# Patient Record
Sex: Male | Born: 1940 | Race: White | Hispanic: No | Marital: Married | Smoking: Former smoker
Health system: Southern US, Community
[De-identification: ages and names within clinical notes are randomized; demographics above are authoritative.]

## PROBLEM LIST (undated history)

## (undated) DIAGNOSIS — I4892 Unspecified atrial flutter: Secondary | ICD-10-CM

## (undated) DIAGNOSIS — I509 Heart failure, unspecified: Secondary | ICD-10-CM

## (undated) DIAGNOSIS — I255 Ischemic cardiomyopathy: Secondary | ICD-10-CM

## (undated) DIAGNOSIS — Z992 Dependence on renal dialysis: Secondary | ICD-10-CM

## (undated) DIAGNOSIS — IMO0001 Reserved for inherently not codable concepts without codable children: Secondary | ICD-10-CM

## (undated) DIAGNOSIS — E785 Hyperlipidemia, unspecified: Secondary | ICD-10-CM

## (undated) DIAGNOSIS — I2510 Atherosclerotic heart disease of native coronary artery without angina pectoris: Secondary | ICD-10-CM

## (undated) DIAGNOSIS — J449 Chronic obstructive pulmonary disease, unspecified: Secondary | ICD-10-CM

## (undated) DIAGNOSIS — K219 Gastro-esophageal reflux disease without esophagitis: Secondary | ICD-10-CM

## (undated) DIAGNOSIS — C61 Malignant neoplasm of prostate: Secondary | ICD-10-CM

## (undated) DIAGNOSIS — M1990 Unspecified osteoarthritis, unspecified site: Secondary | ICD-10-CM

## (undated) DIAGNOSIS — N289 Disorder of kidney and ureter, unspecified: Secondary | ICD-10-CM

## (undated) DIAGNOSIS — I10 Essential (primary) hypertension: Secondary | ICD-10-CM

## (undated) DIAGNOSIS — N186 End stage renal disease: Secondary | ICD-10-CM

## (undated) HISTORY — PX: CORONARY STENT PLACEMENT: SHX1402

## (undated) HISTORY — PX: KIDNEY SURGERY: SHX687

---

## 2005-09-14 ENCOUNTER — Ambulatory Visit: Payer: Self-pay | Admitting: Internal Medicine

## 2005-09-30 ENCOUNTER — Ambulatory Visit: Payer: Self-pay | Admitting: Internal Medicine

## 2006-02-23 ENCOUNTER — Ambulatory Visit: Payer: Self-pay | Admitting: Urology

## 2006-05-06 ENCOUNTER — Ambulatory Visit: Payer: Self-pay | Admitting: Urology

## 2006-11-04 ENCOUNTER — Ambulatory Visit: Payer: Self-pay | Admitting: Urology

## 2007-08-02 ENCOUNTER — Ambulatory Visit: Payer: Self-pay | Admitting: Urology

## 2007-09-05 ENCOUNTER — Ambulatory Visit: Payer: Self-pay | Admitting: Urology

## 2007-09-06 ENCOUNTER — Other Ambulatory Visit: Payer: Self-pay

## 2007-09-06 ENCOUNTER — Ambulatory Visit: Payer: Self-pay | Admitting: Urology

## 2007-09-19 ENCOUNTER — Ambulatory Visit: Payer: Self-pay | Admitting: Urology

## 2008-01-10 ENCOUNTER — Ambulatory Visit: Payer: Self-pay | Admitting: Urology

## 2008-01-11 ENCOUNTER — Ambulatory Visit: Payer: Self-pay | Admitting: Nephrology

## 2008-02-08 ENCOUNTER — Other Ambulatory Visit: Payer: Self-pay

## 2008-02-08 ENCOUNTER — Emergency Department: Payer: Self-pay | Admitting: Emergency Medicine

## 2008-05-02 ENCOUNTER — Ambulatory Visit: Payer: Self-pay | Admitting: Urology

## 2008-12-13 ENCOUNTER — Ambulatory Visit: Payer: Self-pay | Admitting: Urology

## 2009-06-16 ENCOUNTER — Ambulatory Visit: Payer: Self-pay | Admitting: Urology

## 2009-12-17 ENCOUNTER — Ambulatory Visit: Payer: Self-pay | Admitting: Urology

## 2010-04-26 ENCOUNTER — Ambulatory Visit: Payer: Self-pay | Admitting: Radiation Oncology

## 2010-05-14 ENCOUNTER — Ambulatory Visit: Payer: Self-pay | Admitting: Radiation Oncology

## 2010-05-27 ENCOUNTER — Ambulatory Visit: Payer: Self-pay | Admitting: Radiation Oncology

## 2010-06-17 ENCOUNTER — Ambulatory Visit: Payer: Self-pay | Admitting: General Practice

## 2010-06-18 ENCOUNTER — Ambulatory Visit: Payer: Self-pay | Admitting: Urology

## 2010-06-26 ENCOUNTER — Ambulatory Visit: Payer: Self-pay | Admitting: Radiation Oncology

## 2010-07-27 ENCOUNTER — Ambulatory Visit: Payer: Self-pay | Admitting: Radiation Oncology

## 2010-08-27 ENCOUNTER — Ambulatory Visit: Payer: Self-pay | Admitting: Radiation Oncology

## 2011-01-11 ENCOUNTER — Ambulatory Visit: Payer: Self-pay | Admitting: Radiation Oncology

## 2011-01-14 DIAGNOSIS — I129 Hypertensive chronic kidney disease with stage 1 through stage 4 chronic kidney disease, or unspecified chronic kidney disease: Secondary | ICD-10-CM | POA: Insufficient documentation

## 2011-01-27 ENCOUNTER — Ambulatory Visit: Payer: Self-pay | Admitting: Radiation Oncology

## 2011-03-03 ENCOUNTER — Ambulatory Visit: Payer: Self-pay | Admitting: Urology

## 2011-05-18 ENCOUNTER — Ambulatory Visit: Payer: Self-pay | Admitting: Unknown Physician Specialty

## 2011-05-29 ENCOUNTER — Inpatient Hospital Stay: Payer: Self-pay | Admitting: Internal Medicine

## 2011-07-14 ENCOUNTER — Ambulatory Visit: Payer: Self-pay | Admitting: Radiation Oncology

## 2011-07-15 LAB — PSA: PSA: 1.4 ng/mL (ref 0.0–4.0)

## 2011-07-28 ENCOUNTER — Ambulatory Visit: Payer: Self-pay | Admitting: Radiation Oncology

## 2011-08-25 ENCOUNTER — Ambulatory Visit: Payer: Self-pay | Admitting: Internal Medicine

## 2011-09-06 ENCOUNTER — Ambulatory Visit: Payer: Self-pay | Admitting: Urology

## 2011-10-05 ENCOUNTER — Encounter: Payer: Self-pay | Admitting: Internal Medicine

## 2011-10-28 ENCOUNTER — Encounter: Payer: Self-pay | Admitting: Internal Medicine

## 2011-11-27 ENCOUNTER — Encounter: Payer: Self-pay | Admitting: Internal Medicine

## 2012-01-12 ENCOUNTER — Ambulatory Visit: Payer: Self-pay | Admitting: Radiation Oncology

## 2012-01-28 ENCOUNTER — Ambulatory Visit: Payer: Self-pay | Admitting: Radiation Oncology

## 2012-03-20 ENCOUNTER — Ambulatory Visit: Payer: Self-pay | Admitting: Urology

## 2012-09-19 DIAGNOSIS — R339 Retention of urine, unspecified: Secondary | ICD-10-CM | POA: Insufficient documentation

## 2012-09-19 DIAGNOSIS — N059 Unspecified nephritic syndrome with unspecified morphologic changes: Secondary | ICD-10-CM | POA: Insufficient documentation

## 2012-09-19 DIAGNOSIS — M5430 Sciatica, unspecified side: Secondary | ICD-10-CM | POA: Insufficient documentation

## 2012-09-21 DIAGNOSIS — R3129 Other microscopic hematuria: Secondary | ICD-10-CM | POA: Insufficient documentation

## 2012-12-27 ENCOUNTER — Ambulatory Visit: Payer: Self-pay | Admitting: Radiation Oncology

## 2013-01-27 ENCOUNTER — Ambulatory Visit: Payer: Self-pay | Admitting: Radiation Oncology

## 2013-03-15 DIAGNOSIS — R350 Frequency of micturition: Secondary | ICD-10-CM

## 2013-07-03 DIAGNOSIS — I10 Essential (primary) hypertension: Secondary | ICD-10-CM | POA: Insufficient documentation

## 2013-07-03 DIAGNOSIS — N184 Chronic kidney disease, stage 4 (severe): Secondary | ICD-10-CM | POA: Insufficient documentation

## 2014-01-10 ENCOUNTER — Ambulatory Visit: Payer: Self-pay | Admitting: Radiation Oncology

## 2014-03-18 ENCOUNTER — Ambulatory Visit: Payer: Self-pay | Admitting: Radiation Oncology

## 2014-03-27 ENCOUNTER — Ambulatory Visit: Payer: Self-pay | Admitting: Radiation Oncology

## 2014-07-25 ENCOUNTER — Ambulatory Visit: Payer: Self-pay | Admitting: Vascular Surgery

## 2014-07-25 LAB — BASIC METABOLIC PANEL
ANION GAP: 7 (ref 7–16)
BUN: 52 mg/dL — ABNORMAL HIGH (ref 7–18)
CALCIUM: 8 mg/dL — AB (ref 8.5–10.1)
CO2: 25 mmol/L (ref 21–32)
Chloride: 107 mmol/L (ref 98–107)
Creatinine: 3.87 mg/dL — ABNORMAL HIGH (ref 0.60–1.30)
EGFR (African American): 17 — ABNORMAL LOW
EGFR (Non-African Amer.): 15 — ABNORMAL LOW
Glucose: 95 mg/dL (ref 65–99)
Osmolality: 291 (ref 275–301)
Potassium: 5.2 mmol/L — ABNORMAL HIGH (ref 3.5–5.1)
Sodium: 139 mmol/L (ref 136–145)

## 2014-07-25 LAB — URINALYSIS, COMPLETE
BILIRUBIN, UR: NEGATIVE
Bacteria: NONE SEEN
Glucose,UR: NEGATIVE mg/dL (ref 0–75)
Ketone: NEGATIVE
Leukocyte Esterase: NEGATIVE
NITRITE: NEGATIVE
Ph: 5 (ref 4.5–8.0)
RBC,UR: 6 /HPF (ref 0–5)
Specific Gravity: 1.009 (ref 1.003–1.030)
Squamous Epithelial: NONE SEEN

## 2014-07-25 LAB — CBC
HCT: 39.7 % — ABNORMAL LOW (ref 40.0–52.0)
HGB: 12.5 g/dL — AB (ref 13.0–18.0)
MCH: 26.9 pg (ref 26.0–34.0)
MCHC: 31.6 g/dL — ABNORMAL LOW (ref 32.0–36.0)
MCV: 85 fL (ref 80–100)
PLATELETS: 200 10*3/uL (ref 150–440)
RBC: 4.65 10*6/uL (ref 4.40–5.90)
RDW: 13.8 % (ref 11.5–14.5)
WBC: 15.8 10*3/uL — ABNORMAL HIGH (ref 3.8–10.6)

## 2014-08-02 ENCOUNTER — Ambulatory Visit: Payer: Self-pay | Admitting: Vascular Surgery

## 2014-09-03 ENCOUNTER — Ambulatory Visit: Payer: Self-pay | Admitting: Vascular Surgery

## 2014-09-25 DIAGNOSIS — Z8546 Personal history of malignant neoplasm of prostate: Secondary | ICD-10-CM | POA: Insufficient documentation

## 2014-09-25 DIAGNOSIS — E559 Vitamin D deficiency, unspecified: Secondary | ICD-10-CM | POA: Insufficient documentation

## 2014-09-25 DIAGNOSIS — J45909 Unspecified asthma, uncomplicated: Secondary | ICD-10-CM | POA: Insufficient documentation

## 2014-09-25 DIAGNOSIS — I2510 Atherosclerotic heart disease of native coronary artery without angina pectoris: Secondary | ICD-10-CM | POA: Insufficient documentation

## 2014-09-27 DIAGNOSIS — I252 Old myocardial infarction: Secondary | ICD-10-CM | POA: Insufficient documentation

## 2014-11-06 DIAGNOSIS — Z85528 Personal history of other malignant neoplasm of kidney: Secondary | ICD-10-CM | POA: Insufficient documentation

## 2014-11-12 ENCOUNTER — Ambulatory Visit: Payer: Self-pay | Admitting: Vascular Surgery

## 2014-11-12 LAB — BASIC METABOLIC PANEL
Anion Gap: 8 (ref 7–16)
BUN: 51 mg/dL — AB (ref 7–18)
CO2: 28 mmol/L (ref 21–32)
Calcium, Total: 8.1 mg/dL — ABNORMAL LOW (ref 8.5–10.1)
Chloride: 111 mmol/L — ABNORMAL HIGH (ref 98–107)
Creatinine: 4.08 mg/dL — ABNORMAL HIGH (ref 0.60–1.30)
EGFR (African American): 19 — ABNORMAL LOW
GFR CALC NON AF AMER: 15 — AB
Glucose: 95 mg/dL (ref 65–99)
Osmolality: 306 (ref 275–301)
POTASSIUM: 4.5 mmol/L (ref 3.5–5.1)
SODIUM: 147 mmol/L — AB (ref 136–145)

## 2014-12-17 ENCOUNTER — Ambulatory Visit: Payer: Self-pay | Admitting: Vascular Surgery

## 2014-12-17 LAB — BASIC METABOLIC PANEL
ANION GAP: 7 (ref 7–16)
BUN: 45 mg/dL — ABNORMAL HIGH (ref 7–18)
Calcium, Total: 7.7 mg/dL — ABNORMAL LOW (ref 8.5–10.1)
Chloride: 108 mmol/L — ABNORMAL HIGH (ref 98–107)
Co2: 26 mmol/L (ref 21–32)
Creatinine: 4.3 mg/dL — ABNORMAL HIGH (ref 0.60–1.30)
GFR CALC AF AMER: 18 — AB
GFR CALC NON AF AMER: 15 — AB
Glucose: 98 mg/dL (ref 65–99)
Osmolality: 293 (ref 275–301)
POTASSIUM: 4.5 mmol/L (ref 3.5–5.1)
Sodium: 141 mmol/L (ref 136–145)

## 2015-04-01 DIAGNOSIS — M109 Gout, unspecified: Secondary | ICD-10-CM | POA: Insufficient documentation

## 2015-04-01 DIAGNOSIS — T7840XA Allergy, unspecified, initial encounter: Secondary | ICD-10-CM | POA: Insufficient documentation

## 2015-04-01 DIAGNOSIS — N059 Unspecified nephritic syndrome with unspecified morphologic changes: Secondary | ICD-10-CM | POA: Insufficient documentation

## 2015-04-01 DIAGNOSIS — C61 Malignant neoplasm of prostate: Secondary | ICD-10-CM | POA: Insufficient documentation

## 2015-04-08 DIAGNOSIS — I5022 Chronic systolic (congestive) heart failure: Secondary | ICD-10-CM | POA: Insufficient documentation

## 2015-04-19 NOTE — Op Note (Signed)
PATIENT NAME:  Maurice Peterson, MO MR#:  [id number] DATE OF BIRTH:  [birth year]  DATE OF PROCEDURE:  09/03/2014  PREOPERATIVE DIAGNOSES:  1. 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  2. Chronic renal insufficiency stage V.  3. Hypertension.  4. Obesity.   POSTOPERATIVE DIAGNOSES:  1. 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  2. Chronic renal insufficiency stage V.  3. Hypertension.  4. Obesity.   PROCEDURES PERFORMED:  1. Contrast injection of left arm arteriovenous fistula.  2. Percutaneous transluminal angioplasty of the arterial anastomosis to 4 mm.  3. Coil embolization of a branch of the cephalic vein.  4. Coil embolization of an additional branch of the cephalic vein.   SURGEON: Hortencia Pilar, MD.  SEDATION: Versed plus fentanyl. Continuous ECG, pulse oximetry, and cardiopulmonary monitoring is performed throughout the entire procedure by the interventional radiology nurse. Total sedation time was approximately 50 minutes.   ACCESS: A 6 French sheath, retrograde direction  left arm radiocephalic fistula.   CONTRAST USED: Isovue 30 mL.   FLUOROSCOPY TIME: 12.1 minutes.   INDICATIONS: The patient presents with failure of his fistula to mature. His renal function has been stable but is hovering at around 18 to 19. Duplex ultrasound of the AV fistula demonstrated a high-grade lesion within the arterial anastomotic area as well as extensive branches limiting the cephalic flow. Risks and benefits for angiography and intervention of his fistula were reviewed. All questions answered. The patient has agreed to proceed.   DESCRIPTION OF PROCEDURE: The patient is taken to the special procedure suite, placed in the supine position. After adequate sedation is achieved he is positioned supine with his left arm extended palm upward. The left arm is prepped and draped in sterile fashion. Appropriate timeout is called.   Ultrasound is placed in a sterile  sleeve. Ultrasound is being utilized in this circumstance secondary to  requiring a very proximal stick so that the  true cephalic vein can be followed and then access obtained at the level of the antecubital fossa. This is accomplished, verifying patency by compressing the cephalic vein and noting it is echolucent throughout its course.  1% lidocaine is then infiltrated and in a retrograde direction a  microneedle is inserted into the vein at the level of the antecubital fossa, micro wire followed by micro sheath, J-wire followed by a 6 French sheath.   Using a combination of a floppy Glidewire and a Kumpe catheter the wire and catheter are negotiated into the distal radial artery. Hand injection of contrast is then utilized to demonstrate the anatomy.  There is a patent anastomosis, it is somewhat small measuring approximately 2.5 mm in diameter. There is a small cephalic vein in its first 2-3 cm and then it broadens into a more a normal-appearing cephalic vein, however within 5 cm of the actual anastomosis there is a bifurcation of the cephalic system with a large branch extending off. Several other branches are noted as well. Palmar arch appears intact on the injection.   3000 units of heparin is given and a Magic torque wire is advanced through the lesion. A 4 x 4 Lutonix balloon is then advanced over the wire and inflated to approximately 10 atmospheres for 3 minutes. Followup imaging demonstrates that the arterial anastomosis is now widely patent with significant improvement in the overall appearance of the vein and much improved flow.   Attention is then turned to several of the branches. A large branch is engaged with the KMP  catheter and a floppy Glidewire and 2 coils are placed into the branch. The Kumpe catheter is then renegotiated into a separate branch and another 2 coils are placed. Followup imaging demonstrates significant improvement in the flow throughout the true cephalic and the sheath is  then pulled after 4-0 Monocryl pursestring suture has been placed. The patient tolerated the procedure well and there were no immediate complications.   INTERPRETATION: Initially there is a several centimeter segment at the arterial anastomosis of just undersized narrowed vein. There is some disease noted within the radial artery distally, but the flow into the fistula from the proximal portion of the radial artery is widely patent. Following 4 mm Lutonix inflation there is an excellent result.   Following embolization using coils of 2 large side branches there is significant improvement with greater flow within the true cephalic.   SUMMARY: Successful intervention with the hope for improved maturation of his left wrist fistula as described above.    ____________________________ Katha Cabal, MD ggs:bu D: 09/04/2014 19:32:19 ET T: 09/04/2014 19:51:05 ET JOB#: [id number]  cc: Katha Cabal, MD, <Dictator> Katha Cabal MD ELECTRONICALLY SIGNED 10/14/2014 20:40

## 2015-04-19 NOTE — Op Note (Signed)
PATIENT NAME:  Maurice Peterson, Maurice Peterson MR#:  [id number] DATE OF BIRTH:  [birth year]  DATE OF PROCEDURE:  08/02/2014  PREOPERATIVE DIAGNOSIS: End-stage renal disease requiring hemodialysis.   POSTOPERATIVE DIAGNOSIS: End-stage renal disease requiring hemodialysis.   PROCEDURE PERFORMED: Creation of a left arm radiocephalic fistula.   SURGEON: Hortencia Pilar, MD  ASSISTANT: Ms. Melvyn Neth  ANESTHESIA: General by LMA.   FLUIDS: Per anesthesia record.   ESTIMATED BLOOD LOSS: Minimal.   SPECIMEN: None.   INDICATIONS: [name] is a 74 year old gentleman who will require hemodialysis and is therefore undergoing creation of an upper extremity access. Risks and benefits are reviewed. All questions answered. The patient has agreed to proceed.   DESCRIPTION OF PROCEDURE: The patient is taken to the operating room and placed in the supine position. After adequate general anesthesia is induced and appropriate invasive monitors are placed, he is positioned supine with his left arm extended palm upward. The left arm is prepped and draped in a sterile fashion. Appropriate timeout is called.   A linear incision is created midway between the cephalic vein and the radial artery impulse. Dissection is carried down through the soft tissues and the cephalic vein is dissected circumferentially and marked with a surgical marker.   The radial artery is then isolated through the same incision and looped proximally and distally with Silastic vessel loops.   The vein is roughly approximated to the artery and subsequently ligated distally with a 3-0 silk tie. It is then transected with Potts scissors, dilated with Donna Christen coronary dilators up to 3 mm and irrigated with heparinized saline. A small bulldog clamp is placed more proximally. Arteriotomy is then made in the radial artery and 6-0 Prolene stay sutures are placed. The vein is then approximated in an end vein to side radial artery anastomosis using interrupted 6-0  Prolene. Flushing maneuvers are performed and flow is established filling the fistula. A thrill is palpable.   The wound is then irrigated, closed with interrupted 3-0 Vicryl for the subcutaneous layer and 4-0 Monocryl subcuticular followed by Dermabond. The patient tolerated the procedure well and there were no immediate complications.   ____________________________ Katha Cabal, MD ggs:sb D: 08/09/2014 11:31:25 ET T: 08/09/2014 12:10:43 ET JOB#: [id number]  cc: Katha Cabal, MD, <Dictator> Katha Cabal MD ELECTRONICALLY SIGNED 09/03/2014 22:34

## 2015-04-19 NOTE — Op Note (Signed)
PATIENT NAME:  Maurice Peterson, Maurice Peterson MR#:  [id number] DATE OF BIRTH:  1941/03/05  DATE OF PROCEDURE:  11/12/2014  PREOPERATIVE DIAGNOSES:  1.  Complication of arteriovenous dialysis device with failure of left radiocephalic fistula to mature.  2.  Stage V renal insufficiency.  POSTOPERATIVE DIAGNOSES: 1.  Complication of arteriovenous dialysis device with failure of left radiocephalic fistula to mature.  2.  Stage V renal insufficiency.    PROCEDURES PERFORMED:  1.  Contrast injection left radiocephalic fistula.  2.  Percutaneous transluminal angioplasty, balloon assisted maturation, left radiocephalic fistula, beginning with 4 mm balloon and extending through 7 mm balloon.   SURGEON: Katha Cabal, MD   SEDATION: Versed plus fentanyl IV.   Continuous ECG, pulse oximetry and cardiopulmonary monitoring is performed throughout the entire procedure by the interventional radiology nurse.   TOTAL SEDATION TIME: 50 minutes   ACCESS: A 6 French sheath, antegrade direction, left radiocephalic fistula.   FLUOROSCOPY TIME: 3.2 minutes.   CONTRAST USED: 25 mL Isovue.   INDICATIONS: Maurice Peterson is a 74 year old gentleman who is presumptively scheduled to start dialysis at the end of December. He has undergone creation of a fistula, but this has not yielded an adequate access that would be readily cannulated. Risks and benefits for the balloon-assisted maturation were reviewed. All questions answered. The patient agrees to proceed.   DESCRIPTION OF PROCEDURE: The patient is taken to special procedures and placed in a supine position. After adequate sedation is achieved, he is positioned supine with his left arm extended, palm upward. The left arm is prepped and draped in sterile fashion. One percent lidocaine was infiltrated in the soft tissues, at the level of the anastomosis, i.e., at the wrist. Ultrasound is placed in a sterile sleeve. The fistula is identified with ultrasound. It is scanned back to show  the actual anastomosis; it is then scanned forward to a point that would readily allow access. Access is then obtained under direct visualization, after recording an image using a micropuncture needle, microwire, followed by micro sheath, J-wire, followed by a 6 French sheath. Hand injection contrast was used to demonstrate the fistula, which is undersized, but appears uniform. There is a slight irregularity at the tip of the sheath.   Heparin 3000 units are given and a Versacore wire is advanced, beginning with a 4 x 15 Lutonix balloon. Angioplasty to 10 atmospheres is performed for 3 minutes. A second Lutonix, a 5 x 15 is then used and subsequently a 6 x 15 balloon is inflated again for 2-1/2 to 3 minutes. Lastly a 7 x 10 balloon is used for a 2-1/2 minute inflation. Followup imaging demonstrates the vein is now improved significantly in size. There is more rapid flow.   The wire is removed. Sheath is removed after a pursestring suture is placed. There are no immediate complications.   INTERPRETATION: Initial views demonstrate the cephalic vein is uniform and measures approximately 4 mm in diameter, or slightly less. There does appear to be some irregularity at the tip of the sheath near the arterial anastomosis, but otherwise the vein does appear uniform. Previously coiled branch is completely excluded, and there is direct flow through the cephalic vein proper. Several other small branches are still present, but they appear inconsequential.   Following angioplasty serially from 4 to 5 to 6 then finally to 7, there is a significant improvement in the caliber of the cephalic vein with improvement in flow.   SUMMARY: Successful initial treatment using a balloon assisted  maturation technique for the left arm radiocephalic fistula.    ____________________________ Katha Cabal, MD ggs:MT D: 11/12/2014 14:48:08 ET T: 11/12/2014 15:18:35 ET JOB#: [id number]  cc: Katha Cabal, MD,  <Dictator> Katha Cabal MD ELECTRONICALLY SIGNED 11/26/2014 12:59

## 2015-04-23 NOTE — Op Note (Signed)
PATIENT NAME:  Maurice Peterson, Maurice Peterson MR#:  [id number] DATE OF BIRTH:  Mar 14, 1941  DATE OF PROCEDURE:  12/17/2014  PREOPERATIVE DIAGNOSES:  1.  Complication of arteriovenous dialysis device.  2.  Stage V renal insufficiency.   POSTOPERATIVE DIAGNOSES: 1.  Complication of arteriovenous dialysis device.  2.  Stage V renal insufficiency.   PROCEDURE PERFORMED:   1.  Contrast injection left arm AV fistula.  2.  Percutaneous transluminal angioplasty and stent placement, left radiocephalic fistula at the arterial anastomosis.   PROCEDURE PERFORMED BY:  Katha Cabal, M.D.   SEDATION:  Versed 4 mg plus fentanyl 200 mcg administered IV. Continuous ECG, pulse oximetry and cardiopulmonary monitoring was performed throughout the entire procedure by the interventional radiology nurse. Total sedation time was 50 minutes.   ACCESS: A 6 French sheath, retrograde direction, AV fistula.   CONTRAST USED: Isovue 25 mL.   FLUOROSCOPY TIME: 4.9 minutes.   INDICATIONS: Maurice Peterson is a 74 year old gentleman who presents with non-maturation of his fistula. He has undergone one round of angioplasty and is returning for further evaluation and treatment so that he may have a viable access. Risks and benefits are reviewed. The patient agrees to proceed.   DESCRIPTION OF PROCEDURE: The patient is taken to special procedures and placed in the supine position. After adequate sedation is achieved he is positioned supine with his left arm extended palm upward. The left arm was prepped and draped in sterile fashion. Ultrasound is placed in a sterile sleeve. Ultrasound is utilized secondary to lack of appropriate landmarks and to avoid vascular injury. Under real-time visualization, access is obtained at the level of the antecubital fossa in a retrograde direction, microwire followed by micro sheath, J-wire followed by a 6 French sheath.   Floppy Glidewire and Kumpe catheter are then negotiated into the radial artery and hand  injection of contrast is used to demonstrate the fistula as well as the distal radial artery. Greater than 80% stenosis is noted at the arterial anastomosis similar to findings last time. This does not appear to of responded well to angioplasty although initial images after angioplasty the last time did demonstrate a significant result. I would therefore; say this has not yielded a durable reconstruction. Therefore, a Versacore wire is advanced through the Kumpe catheter and a 4 x 6 Lutonix is used to angioplasty the arterial portion. Following this, the wire is exchanged for an 0 and 8 and a 6 x 5 Viabahn is deployed just above the anastomosis extending into the fistula.  It is then postdilated first to 5 and then to 6 mm. Follow-up imaging demonstrates there is now a significant improvement in flow. There is good apposition of the stent. Distal outflow in the upper arm is maintained.   Pursestring suture is placed and the sheath is removed, pressure is held. There are no immediate complications.   INTERPRETATION: Initial views demonstrate a string sign at the arterial portion. Following angioplasty there is improvement, however, there is significant residual stenosis and angioplasty last did not last, particularly along from the previous intervention and, therefore, the Viabahn stent is placed with excellent result. The Viabahn is not deployed in an area that will be accessed. This area that will be used for cannulation remains native cephalic fistula.  The Viabahn is only in the first approximately 2 inches of the fistula at the level of the arterial anastomosis.    ____________________________ Katha Cabal, MD ggs:at D: 12/17/2014 11:55:41 ET T: 12/17/2014 12:21:12 ET JOB#: [id number]  cc: Katha Cabal, MD, <Dictator> Maurice Pascal, MD [facility] Katha Cabal MD ELECTRONICALLY SIGNED 01/07/2015 17:30

## 2015-05-20 ENCOUNTER — Encounter
Admission: RE | Disposition: A | Discharge status: Home / Self Care | Payer: Medicare Other | Source: Ambulatory Visit | Attending: Vascular Surgery

## 2015-05-20 ENCOUNTER — Encounter: Payer: Self-pay | Admitting: *Deleted

## 2015-05-20 ENCOUNTER — Ambulatory Visit
Admission: RE | Admit: 2015-05-20 | Discharge: 2015-05-20 | Disposition: A | Discharge status: Home / Self Care | Payer: Medicare Other | Source: Ambulatory Visit | Attending: Vascular Surgery | Admitting: Vascular Surgery

## 2015-05-20 DIAGNOSIS — Y999 Unspecified external cause status: Secondary | ICD-10-CM | POA: Insufficient documentation

## 2015-05-20 DIAGNOSIS — T82898A Other specified complication of vascular prosthetic devices, implants and grafts, initial encounter: Secondary | ICD-10-CM | POA: Insufficient documentation

## 2015-05-20 DIAGNOSIS — N186 End stage renal disease: Secondary | ICD-10-CM | POA: Diagnosis not present

## 2015-05-20 DIAGNOSIS — Z992 Dependence on renal dialysis: Secondary | ICD-10-CM | POA: Insufficient documentation

## 2015-05-20 HISTORY — DX: Malignant neoplasm of prostate: C61

## 2015-05-20 HISTORY — PX: PERIPHERAL VASCULAR CATHETERIZATION: SHX172C

## 2015-05-20 HISTORY — DX: Essential (primary) hypertension: I10

## 2015-05-20 HISTORY — DX: Chronic obstructive pulmonary disease, unspecified: J44.9

## 2015-05-20 HISTORY — DX: Hyperlipidemia, unspecified: E78.5

## 2015-05-20 LAB — POTASSIUM (ARMC VASCULAR LAB ONLY): Potassium (ARMC vascular lab): 4.3

## 2015-05-20 SURGERY — A/V SHUNTOGRAM/FISTULAGRAM
Anesthesia: Moderate Sedation

## 2015-05-20 MED ORDER — GUAIFENESIN-DM 100-10 MG/5ML PO SYRP: 15.0000 mL | ORAL_SOLUTION | ORAL | Status: DC | PRN

## 2015-05-20 MED ORDER — HEPARIN (PORCINE) IN NACL 2-0.9 UNIT/ML-% IJ SOLN
INTRAMUSCULAR | Status: AC
  Filled 2015-05-20: qty 1000

## 2015-05-20 MED ORDER — HEPARIN SODIUM (PORCINE) 1000 UNIT/ML IJ SOLN
INTRAMUSCULAR | Status: AC
  Filled 2015-05-20: qty 1

## 2015-05-20 MED ORDER — METOPROLOL TARTRATE 1 MG/ML IV SOLN
2.0000 mg | INTRAVENOUS | Status: DC | PRN
Start: 2015-05-20 — End: 2015-05-20

## 2015-05-20 MED ORDER — FENTANYL CITRATE (PF) 100 MCG/2ML IJ SOLN
INTRAMUSCULAR | Status: AC
  Filled 2015-05-20: qty 2

## 2015-05-20 MED ORDER — PHENOL 1.4 % MT LIQD: 1.0000 | OROMUCOSAL | Status: DC | PRN

## 2015-05-20 MED ORDER — FENTANYL CITRATE (PF) 100 MCG/2ML IJ SOLN
INTRAMUSCULAR | Status: AC
Start: 2015-05-20 — End: 2015-05-20
  Filled 2015-05-20: qty 2

## 2015-05-20 MED ORDER — ACETAMINOPHEN 325 MG RE SUPP: 325.0000 mg | RECTAL | Status: DC | PRN

## 2015-05-20 MED ORDER — DOCUSATE SODIUM 100 MG PO CAPS: 100.0000 mg | ORAL_CAPSULE | Freq: Every day | ORAL | Status: DC

## 2015-05-20 MED ORDER — PANTOPRAZOLE SODIUM 40 MG PO TBEC: 40.0000 mg | DELAYED_RELEASE_TABLET | Freq: Every day | ORAL | Status: DC

## 2015-05-20 MED ORDER — MIDAZOLAM HCL 5 MG/5ML IJ SOLN
INTRAMUSCULAR | Status: AC
  Filled 2015-05-20: qty 5

## 2015-05-20 MED ORDER — ACETAMINOPHEN 325 MG PO TABS: 325.0000 mg | ORAL_TABLET | ORAL | Status: DC | PRN

## 2015-05-20 MED ORDER — SODIUM CHLORIDE 0.9 % IV SOLN: INTRAVENOUS | Status: DC

## 2015-05-20 MED ORDER — OXYCODONE-ACETAMINOPHEN 5-325 MG PO TABS: 1.0000 | ORAL_TABLET | ORAL | Status: DC | PRN

## 2015-05-20 MED ORDER — LABETALOL HCL 5 MG/ML IV SOLN: 10.0000 mg | INTRAVENOUS | Status: DC | PRN

## 2015-05-20 MED ORDER — CEFAZOLIN SODIUM 1-5 GM-% IV SOLN
1.0000 g | Freq: Once | INTRAVENOUS | Status: AC
  Administered 2015-05-20: 1 g via INTRAVENOUS

## 2015-05-20 MED ORDER — IOHEXOL 300 MG/ML  SOLN
INTRAMUSCULAR | Status: DC | PRN
  Administered 2015-05-20: 45 mL via INTRAVENOUS

## 2015-05-20 MED ORDER — CEFAZOLIN SODIUM 1-5 GM-% IV SOLN
INTRAVENOUS | Status: AC
  Filled 2015-05-20: qty 50

## 2015-05-20 MED ORDER — ONDANSETRON HCL 4 MG/2ML IJ SOLN: 4.0000 mg | Freq: Four times a day (QID) | INTRAMUSCULAR | Status: DC | PRN

## 2015-05-20 MED ORDER — HYDRALAZINE HCL 20 MG/ML IJ SOLN: 5.0000 mg | INTRAMUSCULAR | Status: DC | PRN

## 2015-05-20 MED ORDER — ALUM & MAG HYDROXIDE-SIMETH 200-200-20 MG/5ML PO SUSP
15.0000 mL | ORAL | Status: DC | PRN
Start: 2015-05-20 — End: 2015-05-20

## 2015-05-20 MED ORDER — LIDOCAINE HCL (PF) 1 % IJ SOLN
INTRAMUSCULAR | Status: AC
  Filled 2015-05-20: qty 10

## 2015-05-20 SURGICAL SUPPLY — 20 items
BALLN ARMADA 6X80X80 (BALLOONS) ×4
BALLN LUTONIX DCB 6X40X130 (BALLOONS) ×4
BALLN ULTRVRSE 5X150X75C (BALLOONS) ×4
BALLN ULTRVRSE 8X100X75 (BALLOONS) ×4
BALLOON ARMADA 6X80X80 (BALLOONS) ×2 IMPLANT
BALLOON LUTONIX DCB 6X40X130 (BALLOONS) ×2 IMPLANT
BALLOON ULTRVRSE 5X150X75C (BALLOONS) ×2 IMPLANT
BALLOON ULTRVRSE 8X100X75 (BALLOONS) ×2 IMPLANT
CATH SLIP KMP 65CM 5FR (CATHETERS) ×4 IMPLANT
DEVICE PRESTO INFLATION (MISCELLANEOUS) ×4 IMPLANT
GUIDEWIRE ANGLED .035 180CM (WIRE) ×4 IMPLANT
PACK ANGIOGRAPHY (CUSTOM PROCEDURE TRAY) ×4 IMPLANT
SET INTRO CAPELLA COAXIAL (SET/KITS/TRAYS/PACK) ×4 IMPLANT
SHEATH BRITE TIP 6FRX5.5 (SHEATH) ×4 IMPLANT
SHEATH BRITE TIP 7FRX5.5 (SHEATH) ×4 IMPLANT
STENT VIABAHN 8X100X120 (Permanent Stent) ×2 IMPLANT
STENT VIABAHN 8X10X120 (Permanent Stent) ×2 IMPLANT
TOWEL OR 17X26 4PK STRL BLUE (TOWEL DISPOSABLE) ×4 IMPLANT
WIRE G 018X200 V18 (WIRE) ×4 IMPLANT
WIRE MAGIC TORQUE 260C (WIRE) ×4 IMPLANT

## 2015-05-20 NOTE — Op Note (Signed)
OPERATIVE NOTE   PROCEDURE: 1. left radiocephalic arteriovenous fistula cannulation under ultrasound guidance 2. left arm AV fistulogram 3. Percutaneous transluminal angioplasty and stent placement left radiocephalic fistula  PRE-OPERATIVE DIAGNOSIS: Complication left arteriovenous fistula                                                       End Stage Renal Disease  POST-OPERATIVE DIAGNOSIS: same as above   SURGEON: Katha Cabal, M.D.  ANESTHESIA: Conscious Sedation   ESTIMATED BLOOD LOSS: minimal  FINDING(S): 1. Diffuse narrowing of the venous outflow of the left arm radiocephalic fistula  SPECIMEN(S):  None  CONTRAST: 55 cc  FLUOROSCOPY TIME: 5 minutes  INDICATIONS: Maurice Peterson is a 74 y.o. male who  presents with malfunctioning left AV access.  The patient is scheduled for left AV fistulogram .  The patient is aware the risks include but are not limited to: bleeding, infection, thrombosis of the cannulated access, and possible anaphylactic reaction to the contrast.  The patient acknowledges if the access can not be salvaged a tunneled catheter will be needed and will be placed during this procedure.  The patient is aware of the risks of the procedure and elects to proceed with the angiogram and intervention.  DESCRIPTION: After full informed written consent was obtained, the patient was brought back to the Special Procedure suite and placed supine position.  Appropriate cardiopulmonary monitors were placed.  The left arm was prepped and draped in the standard fashion.  Appropriate timeout is called. The left radiocephalic fistula  was cannulated with a micropuncture needle.  The microwire was advanced and the needle was exchanged for  a microsheath.  The J-wire was then advanced and a 6 Fr sheath inserted.  Hand injections were completed to image the access from the arterial anastomosis through the entire access.  The central venous structures were also imaged by hand  injections.  Based on the images,  diffuse narrowing of the venous outflow essentially across the entire length of the forearm is noted. Outflow at the antecubital fossa is via the basilic the cephalic vein proper occludes just proximal to the antecubital crease. There is also filling of the brachial veins noted. Central veins are patent. Later in the case reflux image demonstrates that the arterial anastomosis is widely patent as is the previously placed Viabahn stent at the arterial anastomosis.  Based on these images 4000 units of heparin was given and an 035 wire is advanced through the narrowed area and negotiated into the basilic vein. Initially a 5 x 100 balloon supple 6 x 100 balloon was used to dilate this area.  Results although a significant improvement do not yield a fistula that would be readily cannulated and therefore I elected to place a 8 x 100 Viabahn stent. This is deployed without difficulty and then postdilated with an 8 mm balloon. The outflow of the Viabahn stent at the antecubital fossa is treated with a 6 mm Lutonix inflation. I hope is to prevent a hyperplastic response at the proximal margin of the Viabahn stent. Follow-up imaging demonstrates wide patency of the fistula with rapid flow there is approximately 2 cm between the previously placed stent in the arterial portion and the new stent which essentially covers the zone of cannulation. This area appears to be a proximally 7-8 mm  in diameter and free of stenosis or narrowing and therefore I will not add further stents at this time.  A 4-0 Monocryl purse-string suture was sewn around the sheath.  The sheath was removed and light pressure was applied.  A sterile bandage was applied to the puncture site.    COMPLICATIONS: Inability to negotiate the true fistula via a retrograde approach at the level of the antecubital fossa and therefore antegrade puncture was performed at the wrist  CONDITION: Maurice Peterson,  M.D [facility] Office: [phone]  05/20/2015 4:04 PM

## 2015-05-20 NOTE — Discharge Instructions (Signed)

## 2015-05-20 NOTE — H&P (Signed)
 [facility] History & Physical Update  The patient was interviewed and re-examined.  The patient's previous History and Physical has been reviewed and is unchanged.  There is no change in the plan of care.  Schnier, Dolores Lory, MD  05/20/2015, 4:04 PM

## 2015-05-23 ENCOUNTER — Encounter: Payer: Self-pay | Admitting: Vascular Surgery

## 2015-06-17 NOTE — H&P (Signed)
[facility] Admission History & Physical  MRN : [id number]  Maurice Peterson is a 74 y.o. 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  History of Present Illness: The patient is sent by his nephrologist secondary to failure of maturation of his AV fistula. He is now reached the point where he will be initiated on dialysis and therefore needs adequate access. For this reason he is undergoing angiography with the hope for intervention to provide a stable AV access.  No current facility-administered medications for this encounter.   Current Outpatient Prescriptions  Medication Sig Dispense Refill  . albuterol (PROVENTIL HFA;VENTOLIN HFA) 108 (90 BASE) MCG/ACT inhaler Inhale 2 puffs into the lungs every 4 (four) hours as needed for wheezing or shortness of breath.    [name] amLODipine (NORVASC) 5 MG tablet Take 5 mg by mouth daily.    [name] aspirin 81 MG tablet Take 81 mg by mouth daily.    [name] atorvastatin (LIPITOR) 10 MG tablet Take 10 mg by mouth daily.    . budesonide-formoterol (SYMBICORT) 160-4.5 MCG/ACT inhaler Inhale 2 puffs into the lungs 2 (two) times daily.    . calcium carbonate (TUMS - DOSED IN MG ELEMENTAL CALCIUM) 500 MG chewable tablet Chew 1 tablet by mouth 2 (two) times daily.    . Cholecalciferol (VITAMIN D3) 1000 UNITS CAPS Take 2,000 Units by mouth 2 (two) times daily.    . colchicine 0.6 MG tablet Take 0.6 mg by mouth as needed.    . febuxostat (ULORIC) 40 MG tablet Take 40 mg by mouth daily.    . ferrous fumarate (FERRO-SEQUELS) 50 MG CR tablet Take 50 mg by mouth daily.    . furosemide (LASIX) 20 MG tablet Take 20 mg by mouth.    . metoprolol (LOPRESSOR) 50 MG tablet Take 50 mg by mouth daily.    . montelukast (SINGULAIR) 10 MG tablet Take 10 mg by mouth at bedtime.    . pantoprazole (PROTONIX) 40 MG tablet Take 40 mg by mouth daily.    . sodium bicarbonate 650 MG tablet Take 650 mg by mouth 2 (two)  times daily.      Past Medical History  Diagnosis Date  . Chronic kidney disease   . COPD (chronic obstructive pulmonary disease)   . Hypertension   . Hyperlipidemia   . Prostate cancer     Past Surgical History  Procedure Laterality Date  . Peripheral vascular catheterization Left 05/20/2015    Procedure: A/V Shuntogram/Fistulagram;  Surgeon: Katha Cabal, MD;  Location: [facility];  Service: Cardiovascular;  Laterality: Left;  . Peripheral vascular catheterization N/A 05/20/2015    Procedure: A/V Shunt Intervention;  Surgeon: Katha Cabal, MD;  Location: [facility];  Service: Cardiovascular;  Laterality: N/A;    Social History History  Substance Use Topics  . Smoking status: Former Smoker -- 1.00 packs/day    Types: Cigarettes    Quit date: 05/19/2009  . Smokeless tobacco: Not on file  . Alcohol Use: No    Family History No family history on file. no history of porphyria or autoimmune disease  No Known Allergies   REVIEW OF SYSTEMS (Negative unless checked)  Constitutional: [] Weight loss  [] Fever  [] Chills Cardiac: [] Chest pain   [] Chest pressure   [] Palpitations   [] Shortness of breath when laying flat   [] Shortness of breath at rest   [] Shortness of breath with exertion. Vascular:  [] Pain  in legs with walking   [] Pain in legs at rest   [] Pain in legs when laying flat   [] Claudication   [] Pain in feet when walking  [] Pain in feet at rest  [] Pain in feet when laying flat   [] History of DVT   [] Phlebitis   [] Swelling in legs   [] Varicose veins   [] Non-healing ulcers Pulmonary:   [] Uses home oxygen   [] Productive cough   [] Hemoptysis   [] Wheeze  [] COPD   [] Asthma Neurologic:  [] Dizziness  [] Blackouts   [] Seizures   [] History of stroke   [] History of TIA  [] Aphasia   [] Temporary blindness   [] Dysphagia   [] Weakness or numbness in arms   [] Weakness or numbness in legs Musculoskeletal:  [] Arthritis   [] Joint swelling   [] Joint pain   [] Low back  pain Hematologic:  [] Easy bruising  [] Easy bleeding   [] Hypercoagulable state   [] Anemic  [] Hepatitis Gastrointestinal:  [] Blood in stool   [] Vomiting blood  [] Gastroesophageal reflux/heartburn   [] Difficulty swallowing. Genitourinary:  [] Chronic kidney disease   [] Difficult urination  [] Frequent urination  [] Burning with urination   [] Blood in urine Skin:  [] Rashes   [] Ulcers   [] Wounds Psychological:  [] History of anxiety   []  History of major depression.  Physical Examination  Filed Vitals:   05/20/15 1337 05/20/15 1600 05/20/15 1615 05/20/15 1630  BP: 139/86 143/87 137/88 151/93  Pulse: 70 76 66 67  Temp: 98.5 F (36.9 C)     Resp: 18 16 16 20   Height: 5\' 11"  (1.803 m)     Weight: 252 lb (114.306 kg)     SpO2: 97% 96% 97% 98%   Body mass index is 35.16 kg/(m^2).  Head: [place]/AT, No temporalis wasting.  Ear/Nose/Throat: Hearing grossly intact, nares w/o erythema or drainage, oropharynx w/o Erythema/Exudate, Mallampati score: Class II.  Dentition good.  Eyes: PERRLA, EOMI.  Neck: Supple, no nuchal rigidity.  No bruit or JVD.  Pulmonary:  Good air movement, clear to auscultation bilaterally, no increased work of respiration or use of accessory muscles  Cardiac: RRR, normal S1, S2, no Murmurs, rubs or gallops. Vascular: Left wrist AV fistula quite small and clearly this would be difficult to access consistently good thrill and good bruit noted Vessel Right Left  Radial Palpable Palpable  Ulnar Palpable Palpable  Brachial Palpable Palpable  Carotid Palpable, without bruit Palpable, without bruit  Aorta Not palpable N/A  Femoral Palpable Palpable  Popliteal Palpable Palpable  PT Palpable Palpable  DP Palpable Palpable   Gastrointestinal: soft, non-tender/non-distended. No guarding/reflex. No masses, surgical incisions, or scars. Musculoskeletal: M/S 5/5 throughout.  No deformity or atrophy. Neurologic: CN 2-12 intact. Pain and light touch intact in extremities.  Symmetrical.   Speech is fluent. Motor exam as listed above. Psychiatric: Judgment intact, Mood & affect appropriate for pt's clinical situation. Dermatologic: No rashes or ulcers noted.  No cellulitis or open wounds. Lymph : No Cervical, Axillary, or Inguinal lymphadenopathy.  CBC Lab Results  Component Value Date   WBC 15.8* 07/25/2014   HGB 12.5* 07/25/2014   HCT 39.7* 07/25/2014   MCV 85 07/25/2014   PLT 200 07/25/2014    BMET    Component Value Date/Time   NA 141 12/17/2014 1034   K 4.5 12/17/2014 1034   CL 108* 12/17/2014 1034   CO2 26 12/17/2014 1034   GLUCOSE 98 12/17/2014 1034   BUN 45* 12/17/2014 1034   CREATININE 4.30* 12/17/2014 1034   CALCIUM 7.7* 12/17/2014 1034   GFRNONAA 15*  07/25/2014 1040   GFRAA 17* 07/25/2014 1040   CrCl cannot be calculated (Patient has no serum creatinine result on file.).  COAG No results found for: INR, PROTIME  Assessment/Plan Complications of AV dialysis access with failure maturation left wrist AV fistula End stage renal disease now requiring hemodialysis  Patient will require angiography with the hope for intervention to provide a stable AV access. The risks and benefits been reviewed all questions been answered patient agrees to proceed   Delana Meyer, Dolores Lory, MD  06/17/2015 10:48 AM

## 2015-08-05 ENCOUNTER — Ambulatory Visit
Admission: RE | Admit: 2015-08-05 | Discharge: 2015-08-05 | Disposition: A | Discharge status: Home / Self Care | Payer: Medicare Other | Source: Ambulatory Visit | Attending: Vascular Surgery | Admitting: Vascular Surgery

## 2015-08-05 ENCOUNTER — Encounter
Admission: RE | Disposition: A | Discharge status: Home / Self Care | Payer: Self-pay | Source: Ambulatory Visit | Attending: Vascular Surgery

## 2015-08-05 DIAGNOSIS — Z7902 Long term (current) use of antithrombotics/antiplatelets: Secondary | ICD-10-CM | POA: Diagnosis not present

## 2015-08-05 DIAGNOSIS — Y841 Kidney dialysis as the cause of abnormal reaction of the patient, or of later complication, without mention of misadventure at the time of the procedure: Secondary | ICD-10-CM | POA: Insufficient documentation

## 2015-08-05 DIAGNOSIS — Z7982 Long term (current) use of aspirin: Secondary | ICD-10-CM | POA: Insufficient documentation

## 2015-08-05 DIAGNOSIS — I120 Hypertensive chronic kidney disease with stage 5 chronic kidney disease or end stage renal disease: Secondary | ICD-10-CM | POA: Insufficient documentation

## 2015-08-05 DIAGNOSIS — Z992 Dependence on renal dialysis: Secondary | ICD-10-CM | POA: Insufficient documentation

## 2015-08-05 DIAGNOSIS — E785 Hyperlipidemia, unspecified: Secondary | ICD-10-CM | POA: Diagnosis not present

## 2015-08-05 DIAGNOSIS — E669 Obesity, unspecified: Secondary | ICD-10-CM | POA: Diagnosis not present

## 2015-08-05 DIAGNOSIS — J449 Chronic obstructive pulmonary disease, unspecified: Secondary | ICD-10-CM | POA: Insufficient documentation

## 2015-08-05 DIAGNOSIS — N186 End stage renal disease: Secondary | ICD-10-CM | POA: Insufficient documentation

## 2015-08-05 DIAGNOSIS — Z79899 Other long term (current) drug therapy: Secondary | ICD-10-CM | POA: Diagnosis not present

## 2015-08-05 DIAGNOSIS — T82858A Stenosis of vascular prosthetic devices, implants and grafts, initial encounter: Secondary | ICD-10-CM | POA: Diagnosis present

## 2015-08-05 HISTORY — PX: PERIPHERAL VASCULAR CATHETERIZATION: SHX172C

## 2015-08-05 LAB — POTASSIUM (ARMC VASCULAR LAB ONLY): Potassium (ARMC vascular lab): 3.7

## 2015-08-05 SURGERY — A/V SHUNTOGRAM/FISTULAGRAM
Anesthesia: Moderate Sedation

## 2015-08-05 MED ORDER — ACETAMINOPHEN 325 MG PO TABS
325.0000 mg | ORAL_TABLET | ORAL | Status: DC | PRN
Start: 2015-08-05 — End: 2015-08-05

## 2015-08-05 MED ORDER — OXYCODONE HCL 5 MG PO TABS
5.0000 mg | ORAL_TABLET | ORAL | Status: DC | PRN
Start: 2015-08-05 — End: 2015-08-05

## 2015-08-05 MED ORDER — HEPARIN SODIUM (PORCINE) 1000 UNIT/ML IJ SOLN
INTRAMUSCULAR | Status: DC | PRN
  Administered 2015-08-05: 3000 [IU] via INTRAVENOUS

## 2015-08-05 MED ORDER — MIDAZOLAM HCL 5 MG/5ML IJ SOLN
INTRAMUSCULAR | Status: AC
  Filled 2015-08-05: qty 5

## 2015-08-05 MED ORDER — IOHEXOL 300 MG/ML  SOLN
INTRAMUSCULAR | Status: DC | PRN
  Administered 2015-08-05: 45 mL via INTRAVENOUS

## 2015-08-05 MED ORDER — LIDOCAINE HCL (PF) 1 % IJ SOLN
INTRAMUSCULAR | Status: AC
  Filled 2015-08-05: qty 10

## 2015-08-05 MED ORDER — CEFAZOLIN SODIUM 1-5 GM-% IV SOLN
1.0000 g | Freq: Once | INTRAVENOUS | Status: AC
  Administered 2015-08-05: 1 g via INTRAVENOUS

## 2015-08-05 MED ORDER — LABETALOL HCL 5 MG/ML IV SOLN: 10.0000 mg | INTRAVENOUS | Status: DC | PRN

## 2015-08-05 MED ORDER — HEPARIN (PORCINE) IN NACL 2-0.9 UNIT/ML-% IJ SOLN
INTRAMUSCULAR | Status: AC
  Filled 2015-08-05: qty 1000

## 2015-08-05 MED ORDER — ACETAMINOPHEN 325 MG RE SUPP: 325.0000 mg | RECTAL | Status: DC | PRN

## 2015-08-05 MED ORDER — HYDRALAZINE HCL 20 MG/ML IJ SOLN: 5.0000 mg | INTRAMUSCULAR | Status: DC | PRN

## 2015-08-05 MED ORDER — MIDAZOLAM HCL 2 MG/2ML IJ SOLN
INTRAMUSCULAR | Status: DC | PRN
  Administered 2015-08-05: 2 mg via INTRAVENOUS
  Administered 2015-08-05 (×2): 1 mg via INTRAVENOUS

## 2015-08-05 MED ORDER — FENTANYL CITRATE (PF) 100 MCG/2ML IJ SOLN
INTRAMUSCULAR | Status: AC
  Filled 2015-08-05: qty 2

## 2015-08-05 MED ORDER — FENTANYL CITRATE (PF) 100 MCG/2ML IJ SOLN
INTRAMUSCULAR | Status: DC | PRN
  Administered 2015-08-05: 25 ug via INTRAVENOUS
  Administered 2015-08-05 (×3): 50 ug via INTRAVENOUS

## 2015-08-05 MED ORDER — HEPARIN SODIUM (PORCINE) 1000 UNIT/ML IJ SOLN
INTRAMUSCULAR | Status: AC
  Filled 2015-08-05: qty 1

## 2015-08-05 MED ORDER — SODIUM CHLORIDE 0.9 % IV SOLN
INTRAVENOUS | Status: DC
  Administered 2015-08-05 (×2): via INTRAVENOUS

## 2015-08-05 SURGICAL SUPPLY — 11 items
BALLN DORADO 8X60X80 (BALLOONS) ×4
BALLN LUTONIX DCB 6X60X130 (BALLOONS) ×4
BALLOON DORADO 8X60X80 (BALLOONS) ×2 IMPLANT
BALLOON LUTONIX DCB 6X60X130 (BALLOONS) ×2 IMPLANT
DILATOR VESSEL 38 20CM 5FR (VASCULAR PRODUCTS) ×4 IMPLANT
DRAPE BRACHIAL (DRAPES) ×4 IMPLANT
PACK ANGIOGRAPHY (CUSTOM PROCEDURE TRAY) ×4 IMPLANT
SET INTRO CAPELLA COAXIAL (SET/KITS/TRAYS/PACK) ×4 IMPLANT
SHEATH BRITE TIP 6FRX5.5 (SHEATH) ×4 IMPLANT
TOWEL OR 17X26 4PK STRL BLUE (TOWEL DISPOSABLE) ×4 IMPLANT
WIRE MAGIC TORQUE 260C (WIRE) ×4 IMPLANT

## 2015-08-05 NOTE — H&P (Signed)
[facility] History & Physical Update  The patient was interviewed and re-examined.  The patient's previous History and Physical has been reviewed and is unchanged.  There is no change in the plan of care.  Schnier, Dolores Lory, MD  08/05/2015, 3:02 PM

## 2015-08-05 NOTE — Progress Notes (Signed)
OPERATIVE NOTE   PROCEDURE: 1. Contrast injection left radiocephalic fistula 2. Percutaneous transluminal angioplasty venous portion left radiocephalic fistula  PRE-OPERATIVE DIAGNOSIS: Complication of dialysis access                                                       End Stage Renal Disease  POST-OPERATIVE DIAGNOSIS: same as above   SURGEON: Katha Cabal, M.D.  ANESTHESIA: Conscious Sedation   ESTIMATED BLOOD LOSS: minimal  FINDING(S): 1. 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 male who  presents with malfunctioning left wrist AV access.  The patient is scheduled for angiography with possible intervention of the AV access.  The patient is aware the risks include but are not limited to: bleeding, infection, thrombosis of the cannulated access, and possible anaphylactic reaction to the contrast.  The patient acknowledges if the access can not be salvaged a tunneled catheter will be needed and will be placed during this procedure.  The patient is aware of the risks of the procedure and elects to proceed with the angiogram and intervention.  DESCRIPTION: After full informed written consent was obtained, the patient was brought back to the Special Procedure suite and placed supine position.  Appropriate cardiopulmonary monitors were placed.  The left arm was prepped and draped in the standard fashion.  Appropriate timeout is called. The radiocephalic fistula  was cannulated with a micropuncture needle.  The microwire was advanced and the needle was exchanged for  a microsheath.  The J-wire was then advanced and a 6 Fr sheath inserted.  Hand injections were completed to image the access from the arterial anastomosis through the entire access.  The central venous structures were also imaged by hand injections.  Based on the images,   a greater than 90% narrowing in the stent across the venous area is identified. The venous pattern at the antecubital fossa is widely patent and fills a very nice basilic vein. In later images the brachial veins are imaged and they are patent. Cephalic vein occludes at the mid biceps level. Of note central veins are also imaged and the wire are widely patent. Arterial anastomosis is imaged with reflux of contrast and this too is widely patent. The stent at the level of the wrist is widely patent.  3000 to heparin was given and allowed to circulate for several minutes.  Magic torque wires then advanced across the stenosis and out the basilic vein. Initially a 6 x 6 Lutonix balloon is inflated across the lesion for 2 minutes and subsequently an 8 x 6 Dorado balloon is inflated across the narrowing for approximately 2 minutes inflation is to 16 atm. Follow-up imaging demonstrates a significant improvement however there remains 20% residual stenosis throughout the stent. The venous outflow itself is widely patent..    A 4-0 Monocryl purse-string suture was sewn around the sheath.  The sheath was removed and light pressure was applied.  A sterile bandage was applied to the puncture site.     Based on my findings there appeared to be 2 good possibilities at this time; and Artegraft could be used to jump from the proximal portion of the fistula anastomosing it to  the segment that is free of Viabahn stent at the level of the wrist and then extending to just proximal to the venous stent where the native venous system normalizes. This would essentially created a straight forearm graft. Alternatively, a basilic transposition could be formed is well. I would plan to see how dialysis goes now that the stricture has been treated and follow with ultrasound to see held durable this reconstruction is   COMPLICATIONS: None  CONDITION: Good  Katha Cabal, M.D  [facility] Office:  [phone]  08/05/2015 3:04 PM

## 2015-08-06 ENCOUNTER — Encounter: Payer: Self-pay | Admitting: Vascular Surgery

## 2015-08-15 ENCOUNTER — Encounter: Payer: Self-pay | Admitting: *Deleted

## 2015-08-18 NOTE — Discharge Instructions (Signed)

## 2015-08-20 ENCOUNTER — Ambulatory Visit
Admission: RE | Admit: 2015-08-20 | Discharge: 2015-08-20 | Disposition: A | Discharge status: Home / Self Care | Payer: Medicare Other | Source: Ambulatory Visit | Attending: Ophthalmology | Admitting: Ophthalmology

## 2015-08-20 ENCOUNTER — Encounter: Payer: Self-pay | Admitting: Anesthesiology

## 2015-08-20 ENCOUNTER — Ambulatory Visit: Payer: Medicare Other | Admitting: Anesthesiology

## 2015-08-20 ENCOUNTER — Encounter
Admission: RE | Disposition: A | Discharge status: Home / Self Care | Payer: Self-pay | Source: Ambulatory Visit | Attending: Ophthalmology

## 2015-08-20 DIAGNOSIS — I2510 Atherosclerotic heart disease of native coronary artery without angina pectoris: Secondary | ICD-10-CM | POA: Diagnosis not present

## 2015-08-20 DIAGNOSIS — K219 Gastro-esophageal reflux disease without esophagitis: Secondary | ICD-10-CM | POA: Insufficient documentation

## 2015-08-20 DIAGNOSIS — H2511 Age-related nuclear cataract, right eye: Secondary | ICD-10-CM | POA: Diagnosis present

## 2015-08-20 DIAGNOSIS — Z8546 Personal history of malignant neoplasm of prostate: Secondary | ICD-10-CM | POA: Insufficient documentation

## 2015-08-20 DIAGNOSIS — M109 Gout, unspecified: Secondary | ICD-10-CM | POA: Diagnosis not present

## 2015-08-20 DIAGNOSIS — J449 Chronic obstructive pulmonary disease, unspecified: Secondary | ICD-10-CM | POA: Insufficient documentation

## 2015-08-20 DIAGNOSIS — I120 Hypertensive chronic kidney disease with stage 5 chronic kidney disease or end stage renal disease: Secondary | ICD-10-CM | POA: Insufficient documentation

## 2015-08-20 DIAGNOSIS — I509 Heart failure, unspecified: Secondary | ICD-10-CM | POA: Insufficient documentation

## 2015-08-20 DIAGNOSIS — M1990 Unspecified osteoarthritis, unspecified site: Secondary | ICD-10-CM | POA: Insufficient documentation

## 2015-08-20 DIAGNOSIS — N186 End stage renal disease: Secondary | ICD-10-CM | POA: Diagnosis not present

## 2015-08-20 HISTORY — PX: CATARACT EXTRACTION W/PHACO: SHX586

## 2015-08-20 HISTORY — DX: Reserved for inherently not codable concepts without codable children: IMO0001

## 2015-08-20 HISTORY — DX: Gastro-esophageal reflux disease without esophagitis: K21.9

## 2015-08-20 HISTORY — DX: Unspecified osteoarthritis, unspecified site: M19.90

## 2015-08-20 SURGERY — PHACOEMULSIFICATION, CATARACT, WITH IOL INSERTION
Anesthesia: Monitor Anesthesia Care | Laterality: Right | Wound class: Clean

## 2015-08-20 MED ORDER — CEFUROXIME OPHTHALMIC INJECTION 1 MG/0.1 ML
INJECTION | OPHTHALMIC | Status: DC | PRN
  Administered 2015-08-20: 0.1 mL via INTRACAMERAL

## 2015-08-20 MED ORDER — LACTATED RINGERS IV SOLN: INTRAVENOUS | Status: DC

## 2015-08-20 MED ORDER — BRIMONIDINE TARTRATE 0.2 % OP SOLN
OPHTHALMIC | Status: DC | PRN
Start: 2015-08-20 — End: 2015-08-20
  Administered 2015-08-20: 1 [drp] via OPHTHALMIC

## 2015-08-20 MED ORDER — FENTANYL CITRATE (PF) 100 MCG/2ML IJ SOLN
INTRAMUSCULAR | Status: DC | PRN
  Administered 2015-08-20: 50 ug via INTRAVENOUS

## 2015-08-20 MED ORDER — ARMC OPHTHALMIC DILATING GEL
1.0000 "application " | OPHTHALMIC | Status: DC | PRN
  Administered 2015-08-20 (×2): 1 via OPHTHALMIC

## 2015-08-20 MED ORDER — NA HYALUR & NA CHOND-NA HYALUR 0.4-0.35 ML IO KIT
PACK | INTRAOCULAR | Status: DC | PRN
  Administered 2015-08-20: 1 mL via INTRAOCULAR

## 2015-08-20 MED ORDER — TETRACAINE HCL 0.5 % OP SOLN
1.0000 [drp] | OPHTHALMIC | Status: DC | PRN
  Administered 2015-08-20: 1 [drp] via OPHTHALMIC

## 2015-08-20 MED ORDER — MIDAZOLAM HCL 2 MG/2ML IJ SOLN
INTRAMUSCULAR | Status: DC | PRN
  Administered 2015-08-20: 2 mg via INTRAVENOUS

## 2015-08-20 MED ORDER — POVIDONE-IODINE 5 % OP SOLN
1.0000 "application " | OPHTHALMIC | Status: DC | PRN
  Administered 2015-08-20: 1 via OPHTHALMIC

## 2015-08-20 MED ORDER — TIMOLOL MALEATE 0.5 % OP SOLN
OPHTHALMIC | Status: DC | PRN
  Administered 2015-08-20: 1 [drp] via OPHTHALMIC

## 2015-08-20 MED ORDER — EPINEPHRINE HCL 1 MG/ML IJ SOLN
INTRAOCULAR | Status: DC | PRN
  Administered 2015-08-20: 90 mL via OPHTHALMIC

## 2015-08-20 SURGICAL SUPPLY — 26 items
CANNULA ANT/CHMB 27GA (MISCELLANEOUS) ×3 IMPLANT
GLOVE SURG LX 7.5 STRW (GLOVE) ×2
GLOVE SURG LX STRL 7.5 STRW (GLOVE) ×1 IMPLANT
GLOVE SURG TRIUMPH 8.0 PF LTX (GLOVE) ×3 IMPLANT
GOWN STRL REUS W/ TWL LRG LVL3 (GOWN DISPOSABLE) ×2 IMPLANT
GOWN STRL REUS W/TWL LRG LVL3 (GOWN DISPOSABLE) ×4
LENS IOL TECNIS 21.5 (Intraocular Lens) ×3 IMPLANT
LENS IOL TECNIS MONO 1P 21.5 (Intraocular Lens) ×1 IMPLANT
MARKER SKIN SURG W/RULER VIO (MISCELLANEOUS) ×3 IMPLANT
NDL RETROBULBAR .5 NSTRL (NEEDLE) IMPLANT
NEEDLE FILTER BLUNT 18X 1/2SAF (NEEDLE) ×4
NEEDLE FILTER BLUNT 18X1 1/2 (NEEDLE) ×2 IMPLANT
PACK CATARACT BRASINGTON (MISCELLANEOUS) ×3 IMPLANT
PACK EYE AFTER SURG (MISCELLANEOUS) ×3 IMPLANT
PACK OPTHALMIC (MISCELLANEOUS) ×3 IMPLANT
RING MALYGIN 7.0 (MISCELLANEOUS) IMPLANT
SUT ETHILON 10-0 CS-B-6CS-B-6 (SUTURE)
SUT VICRYL  9 0 (SUTURE)
SUT VICRYL 9 0 (SUTURE) IMPLANT
SUTURE EHLN 10-0 CS-B-6CS-B-6 (SUTURE) IMPLANT
SYR 3ML LL SCALE MARK (SYRINGE) ×6 IMPLANT
SYR 5ML LL (SYRINGE) IMPLANT
SYR TB 1ML LUER SLIP (SYRINGE) ×3 IMPLANT
WATER STERILE IRR 250ML POUR (IV SOLUTION) ×3 IMPLANT
WATER STERILE IRR 500ML POUR (IV SOLUTION) IMPLANT
WIPE NON LINTING 3.25X3.25 (MISCELLANEOUS) ×3 IMPLANT

## 2015-08-20 NOTE — Transfer of Care (Signed)
Immediate Anesthesia Transfer of Care Note  Patient: Maurice Peterson  Procedure(s) Performed: Procedure(s): CATARACT EXTRACTION PHACO AND INTRAOCULAR LENS PLACEMENT (IOC) (Right)  Patient Location: PACU  Anesthesia Type: MAC  Level of Consciousness: awake, alert  and patient cooperative  Airway and Oxygen Therapy: Patient Spontanous Breathing and Patient connected to supplemental oxygen  Post-op Assessment: Post-op Vital signs reviewed, Patient's Cardiovascular Status Stable, Respiratory Function Stable, Patent Airway and No signs of Nausea or vomiting  Post-op Vital Signs: Reviewed and stable  Complications: No apparent anesthesia complications

## 2015-08-20 NOTE — Anesthesia Preprocedure Evaluation (Signed)
Anesthesia Evaluation  Patient identified by MRN, date of birth, ID band  Reviewed: NPO status   History of Anesthesia Complications Negative for: history of anesthetic complications  Airway Mallampati: II  TM Distance: >3 FB Neck ROM: full    Dental  (+) Missing,    Pulmonary COPD (mild)   Pulmonary exam normal       Cardiovascular Exercise Tolerance: Good hypertension, + CAD, + Cardiac Stents (x2 2012) and +CHF (ef=35%) Normal cardiovascular exam    Neuro/Psych negative neurological ROS  negative psych ROS   GI/Hepatic Neg liver ROS, GERD-  Controlled,  Endo/Other  negative endocrine ROS  Renal/GU Dialysis and ESRFRenal disease (last HD 8/23)  negative genitourinary   Musculoskeletal  (+) Arthritis -, Gout    Abdominal   Peds  Hematology Prostate cancer; Renal tumor   Anesthesia Other Findings echo: 03/2015: ef=35%; MODERATE LV SYSTOLIC DYSFUNCTION (See above)NORMAL RIGHT VENTRICULAR SYSTOLIC FUNCTIONMILD VALVULAR REGURGITATION; NO VALVULAR STENOSIS;  cards stable: Flossie Dibble, MD - 08/15/2015;     Reproductive/Obstetrics                             Anesthesia Physical Anesthesia Plan  ASA: III  Anesthesia Plan: MAC   Post-op Pain Management:    Induction:   Airway Management Planned:   Additional Equipment:   Intra-op Plan:   Post-operative Plan:   Informed Consent: I have reviewed the patients History and Physical, chart, labs and discussed the procedure including the risks, benefits and alternatives for the proposed anesthesia with the patient or authorized representative who has indicated his/her understanding and acceptance.     Plan Discussed with: CRNA  Anesthesia Plan Comments:         Anesthesia Quick Evaluation

## 2015-08-20 NOTE — Op Note (Signed)
LOCATION:  [facility]   PREOPERATIVE DIAGNOSIS:    Nuclear sclerotic cataract right eye. H25.11   POSTOPERATIVE DIAGNOSIS:  Nuclear sclerotic cataract right eye.     PROCEDURE:  Phacoemusification with posterior chamber intraocular lens placement of the right eye   LENS:   Implant Name Type Inv. Item Serial No. Manufacturer Lot No. LRB No. Used  LENS IMPL INTRAOC [id number] 21.5 - [id number] Intraocular Lens LENS IMPL INTRAOC ZCB00 21.5 [id number] AMO   Right 1        ULTRASOUND TIME: 18 % of 1 minutes, 36 seconds.  CDE 17.3   SURGEON:  Wyonia Hough, MD   ANESTHESIA:  Topical with tetracaine drops and 2% Xylocaine jelly.   COMPLICATIONS:  None.   DESCRIPTION OF PROCEDURE:  The patient was identified in the holding room and transported to the operating room and placed in the supine position under the operating microscope.  The right eye was identified as the operative eye and it was prepped and draped in the usual sterile ophthalmic fashion.   A 1 millimeter clear-corneal paracentesis was made at the 12:00 position.  The anterior chamber was filled with Viscoat viscoelastic.  A 2.4 millimeter keratome was used to make a near-clear corneal incision at the 9:00 position.  A curvilinear capsulorrhexis was made with a cystotome and capsulorrhexis forceps.  Balanced salt solution was used to hydrodissect and hydrodelineate the nucleus.   Phacoemulsification was then used in stop and chop fashion to remove the lens nucleus and epinucleus.  The remaining cortex was then removed using the irrigation and aspiration handpiece. Provisc was then placed into the capsular bag to distend it for lens placement.  A lens was then injected into the capsular bag.  The remaining viscoelastic was aspirated.   Wounds were hydrated with balanced salt solution.  The anterior chamber was inflated to a physiologic pressure with balanced salt solution.  No wound leaks were noted. Cefuroxime 0.1 ml of a  10mg /ml solution was injected into the anterior chamber for a dose of 1 mg of intracameral antibiotic at the completion of the case.   Timolol and Brimonidine drops were applied to the eye.  The patient was taken to the recovery room in stable condition without complications of anesthesia or surgery.   Antoniette Peake 08/20/2015, 9:48 AM

## 2015-08-20 NOTE — Anesthesia Procedure Notes (Signed)
Procedure Name: MAC Performed by: Khristopher Kapaun Pre-anesthesia Checklist: Patient identified, Emergency Drugs available, Suction available, Timeout performed and Patient being monitored Patient Re-evaluated:Patient Re-evaluated prior to inductionOxygen Delivery Method: Nasal cannula Placement Confirmation: positive ETCO2     

## 2015-08-20 NOTE — Anesthesia Postprocedure Evaluation (Signed)
  Anesthesia Post-op Note  Patient: Maurice Peterson  Procedure(s) Performed: Procedure(s): CATARACT EXTRACTION PHACO AND INTRAOCULAR LENS PLACEMENT (IOC) (Right)  Anesthesia type:MAC  Patient location: PACU  Post pain: Pain level controlled  Post assessment: Post-op Vital signs reviewed, Patient's Cardiovascular Status Stable, Respiratory Function Stable, Patent Airway and No signs of Nausea or vomiting  Post vital signs: Reviewed and stable  Last Vitals:  Filed Vitals:   08/20/15 0951  BP: 125/83  Pulse: 75  Temp: 36.5 C  Resp: 14    Level of consciousness: awake, alert  and patient cooperative  Complications: No apparent anesthesia complications

## 2015-08-20 NOTE — H&P (Signed)
  The History and Physical notes were scanned in.  The patient remains stable and unchanged from the H&P.   Previous H&P reviewed, patient examined, and there are no changes.  Maurice Peterson 08/20/2015 8:25 AM

## 2015-08-21 ENCOUNTER — Encounter: Payer: Self-pay | Admitting: Ophthalmology

## 2015-09-03 ENCOUNTER — Encounter: Payer: Self-pay | Admitting: *Deleted

## 2015-09-08 NOTE — Discharge Instructions (Signed)

## 2015-09-10 ENCOUNTER — Ambulatory Visit: Payer: Medicare Other | Admitting: Anesthesiology

## 2015-09-10 ENCOUNTER — Encounter
Admission: RE | Disposition: A | Discharge status: Home / Self Care | Payer: Self-pay | Source: Ambulatory Visit | Attending: Ophthalmology

## 2015-09-10 ENCOUNTER — Ambulatory Visit
Admission: RE | Admit: 2015-09-10 | Discharge: 2015-09-10 | Disposition: A | Discharge status: Home / Self Care | Payer: Medicare Other | Source: Ambulatory Visit | Attending: Ophthalmology | Admitting: Ophthalmology

## 2015-09-10 DIAGNOSIS — Z7982 Long term (current) use of aspirin: Secondary | ICD-10-CM | POA: Diagnosis not present

## 2015-09-10 DIAGNOSIS — J45909 Unspecified asthma, uncomplicated: Secondary | ICD-10-CM | POA: Diagnosis not present

## 2015-09-10 DIAGNOSIS — M1990 Unspecified osteoarthritis, unspecified site: Secondary | ICD-10-CM | POA: Diagnosis not present

## 2015-09-10 DIAGNOSIS — Z9849 Cataract extraction status, unspecified eye: Secondary | ICD-10-CM | POA: Diagnosis not present

## 2015-09-10 DIAGNOSIS — Z79899 Other long term (current) drug therapy: Secondary | ICD-10-CM | POA: Insufficient documentation

## 2015-09-10 DIAGNOSIS — I509 Heart failure, unspecified: Secondary | ICD-10-CM | POA: Insufficient documentation

## 2015-09-10 DIAGNOSIS — N186 End stage renal disease: Secondary | ICD-10-CM | POA: Diagnosis not present

## 2015-09-10 DIAGNOSIS — I2510 Atherosclerotic heart disease of native coronary artery without angina pectoris: Secondary | ICD-10-CM | POA: Diagnosis not present

## 2015-09-10 DIAGNOSIS — Z8546 Personal history of malignant neoplasm of prostate: Secondary | ICD-10-CM | POA: Insufficient documentation

## 2015-09-10 DIAGNOSIS — J449 Chronic obstructive pulmonary disease, unspecified: Secondary | ICD-10-CM | POA: Diagnosis not present

## 2015-09-10 DIAGNOSIS — H9190 Unspecified hearing loss, unspecified ear: Secondary | ICD-10-CM | POA: Diagnosis not present

## 2015-09-10 DIAGNOSIS — Z955 Presence of coronary angioplasty implant and graft: Secondary | ICD-10-CM | POA: Diagnosis not present

## 2015-09-10 DIAGNOSIS — Z888 Allergy status to other drugs, medicaments and biological substances status: Secondary | ICD-10-CM | POA: Insufficient documentation

## 2015-09-10 DIAGNOSIS — Z85828 Personal history of other malignant neoplasm of skin: Secondary | ICD-10-CM | POA: Diagnosis not present

## 2015-09-10 DIAGNOSIS — I120 Hypertensive chronic kidney disease with stage 5 chronic kidney disease or end stage renal disease: Secondary | ICD-10-CM | POA: Diagnosis not present

## 2015-09-10 DIAGNOSIS — I252 Old myocardial infarction: Secondary | ICD-10-CM | POA: Insufficient documentation

## 2015-09-10 DIAGNOSIS — Z992 Dependence on renal dialysis: Secondary | ICD-10-CM | POA: Diagnosis not present

## 2015-09-10 DIAGNOSIS — Z87891 Personal history of nicotine dependence: Secondary | ICD-10-CM | POA: Insufficient documentation

## 2015-09-10 DIAGNOSIS — H2512 Age-related nuclear cataract, left eye: Secondary | ICD-10-CM | POA: Insufficient documentation

## 2015-09-10 HISTORY — PX: CATARACT EXTRACTION W/PHACO: SHX586

## 2015-09-10 SURGERY — PHACOEMULSIFICATION, CATARACT, WITH IOL INSERTION
Anesthesia: Monitor Anesthesia Care | Laterality: Left | Wound class: Clean

## 2015-09-10 MED ORDER — POVIDONE-IODINE 5 % OP SOLN
1.0000 "application " | OPHTHALMIC | Status: DC | PRN
  Administered 2015-09-10: 1 via OPHTHALMIC

## 2015-09-10 MED ORDER — CEFUROXIME OPHTHALMIC INJECTION 1 MG/0.1 ML
INJECTION | OPHTHALMIC | Status: DC | PRN
  Administered 2015-09-10: 0.1 mL via INTRACAMERAL

## 2015-09-10 MED ORDER — PROPARACAINE HCL 0.5 % OP SOLN
1.0000 [drp] | Freq: Once | OPHTHALMIC | Status: AC
  Administered 2015-09-10: 1 [drp] via OPHTHALMIC

## 2015-09-10 MED ORDER — ARMC OPHTHALMIC DILATING GEL
1.0000 | OPHTHALMIC | Status: DC | PRN
Start: 2015-09-10 — End: 2015-09-10
  Administered 2015-09-10 (×2): 1 via OPHTHALMIC

## 2015-09-10 MED ORDER — TIMOLOL MALEATE 0.5 % OP SOLN
OPHTHALMIC | Status: DC | PRN
  Administered 2015-09-10: 1 [drp] via OPHTHALMIC

## 2015-09-10 MED ORDER — LACTATED RINGERS IV SOLN: INTRAVENOUS | Status: DC

## 2015-09-10 MED ORDER — NA HYALUR & NA CHOND-NA HYALUR 0.4-0.35 ML IO KIT
PACK | INTRAOCULAR | Status: DC | PRN
  Administered 2015-09-10: 1 mL via INTRAOCULAR

## 2015-09-10 MED ORDER — OXYCODONE HCL 5 MG/5ML PO SOLN: 5.0000 mg | Freq: Once | ORAL | Status: DC | PRN

## 2015-09-10 MED ORDER — LACTATED RINGERS IV SOLN: 500.0000 mL | INTRAVENOUS | Status: DC

## 2015-09-10 MED ORDER — OXYCODONE HCL 5 MG PO TABS: 5.0000 mg | ORAL_TABLET | Freq: Once | ORAL | Status: DC | PRN

## 2015-09-10 MED ORDER — BRIMONIDINE TARTRATE 0.2 % OP SOLN
OPHTHALMIC | Status: DC | PRN
  Administered 2015-09-10: 1 [drp] via OPHTHALMIC

## 2015-09-10 MED ORDER — FENTANYL CITRATE (PF) 100 MCG/2ML IJ SOLN: 25.0000 ug | INTRAMUSCULAR | Status: DC | PRN

## 2015-09-10 MED ORDER — FENTANYL CITRATE (PF) 100 MCG/2ML IJ SOLN
INTRAMUSCULAR | Status: DC | PRN
  Administered 2015-09-10: 50 ug via INTRAVENOUS

## 2015-09-10 MED ORDER — ACETAMINOPHEN 160 MG/5ML PO SOLN: 325.0000 mg | ORAL | Status: DC | PRN

## 2015-09-10 MED ORDER — MIDAZOLAM HCL 2 MG/2ML IJ SOLN
INTRAMUSCULAR | Status: DC | PRN
  Administered 2015-09-10: 2 mg via INTRAVENOUS

## 2015-09-10 MED ORDER — DEXAMETHASONE SODIUM PHOSPHATE 4 MG/ML IJ SOLN: 8.0000 mg | Freq: Once | INTRAMUSCULAR | Status: DC | PRN

## 2015-09-10 MED ORDER — EPINEPHRINE HCL 1 MG/ML IJ SOLN
INTRAOCULAR | Status: DC | PRN
  Administered 2015-09-10: 81 mL via OPHTHALMIC

## 2015-09-10 MED ORDER — ACETAMINOPHEN 325 MG PO TABS: 325.0000 mg | ORAL_TABLET | ORAL | Status: DC | PRN

## 2015-09-10 SURGICAL SUPPLY — 26 items
CANNULA ANT/CHMB 27GA (MISCELLANEOUS) ×3 IMPLANT
GLOVE SURG LX 7.5 STRW (GLOVE) ×2
GLOVE SURG LX STRL 7.5 STRW (GLOVE) ×1 IMPLANT
GLOVE SURG TRIUMPH 8.0 PF LTX (GLOVE) ×3 IMPLANT
GOWN STRL REUS W/ TWL LRG LVL3 (GOWN DISPOSABLE) ×2 IMPLANT
GOWN STRL REUS W/TWL LRG LVL3 (GOWN DISPOSABLE) ×4
LENS IOL TECNIS 22.0 (Intraocular Lens) ×3 IMPLANT
LENS IOL TECNIS MONO 1P 22.0 (Intraocular Lens) ×1 IMPLANT
MARKER SKIN SURG W/RULER VIO (MISCELLANEOUS) ×3 IMPLANT
NDL RETROBULBAR .5 NSTRL (NEEDLE) IMPLANT
NEEDLE FILTER BLUNT 18X 1/2SAF (NEEDLE) ×2
NEEDLE FILTER BLUNT 18X1 1/2 (NEEDLE) ×1 IMPLANT
PACK CATARACT BRASINGTON (MISCELLANEOUS) ×3 IMPLANT
PACK EYE AFTER SURG (MISCELLANEOUS) ×3 IMPLANT
PACK OPTHALMIC (MISCELLANEOUS) ×3 IMPLANT
RING MALYGIN 7.0 (MISCELLANEOUS) IMPLANT
SUT ETHILON 10-0 CS-B-6CS-B-6 (SUTURE)
SUT VICRYL  9 0 (SUTURE)
SUT VICRYL 9 0 (SUTURE) IMPLANT
SUTURE EHLN 10-0 CS-B-6CS-B-6 (SUTURE) IMPLANT
SYR 3ML LL SCALE MARK (SYRINGE) ×3 IMPLANT
SYR 5ML LL (SYRINGE) IMPLANT
SYR TB 1ML LUER SLIP (SYRINGE) ×3 IMPLANT
WATER STERILE IRR 250ML POUR (IV SOLUTION) ×3 IMPLANT
WATER STERILE IRR 500ML POUR (IV SOLUTION) IMPLANT
WIPE NON LINTING 3.25X3.25 (MISCELLANEOUS) ×3 IMPLANT

## 2015-09-10 NOTE — H&P (Signed)
  The History and Physical notes were scanned in.  The patient remains stable and unchanged from the H&P.   Previous H&P reviewed, patient examined, and there are no changes.  Argusta Mcgann 09/10/2015 9:42 AM

## 2015-09-10 NOTE — Transfer of Care (Signed)
Immediate Anesthesia Transfer of Care Note  Patient: Maurice Peterson  Procedure(s) Performed: Procedure(s): CATARACT EXTRACTION PHACO AND INTRAOCULAR LENS PLACEMENT (IOC) (Left)  Patient Location: PACU  Anesthesia Type: MAC  Level of Consciousness: awake, alert  and patient cooperative  Airway and Oxygen Therapy: Patient Spontanous Breathing and Patient connected to supplemental oxygen  Post-op Assessment: Post-op Vital signs reviewed, Patient's Cardiovascular Status Stable, Respiratory Function Stable, Patent Airway and No signs of Nausea or vomiting  Post-op Vital Signs: Reviewed and stable  Complications: No apparent anesthesia complications

## 2015-09-10 NOTE — Anesthesia Preprocedure Evaluation (Addendum)
Anesthesia Evaluation  Patient identified by MRN, date of birth, ID band Patient awake    Reviewed: Allergy & Precautions, H&P , NPO status , Patient's Chart, lab work & pertinent test results, reviewed documented beta blocker date and time   Airway Mallampati: II  TM Distance: >3 FB Neck ROM: full    Dental no notable dental hx.    Pulmonary shortness of breath and with exertion, COPD,  COPD inhaler, former smoker,    Pulmonary exam normal breath sounds clear to auscultation       Cardiovascular Exercise Tolerance: Good hypertension,  Rhythm:regular Rate:Normal     Neuro/Psych negative neurological ROS  negative psych ROS   GI/Hepatic Neg liver ROS, GERD  Medicated,  Endo/Other  negative endocrine ROS  Renal/GU CRFRenal disease  negative genitourinary   Musculoskeletal   Abdominal   Peds  Hematology negative hematology ROS (+)   Anesthesia Other Findings   Reproductive/Obstetrics negative OB ROS                            Anesthesia Physical Anesthesia Plan  ASA: III  Anesthesia Plan: MAC   Post-op Pain Management:    Induction:   Airway Management Planned:   Additional Equipment:   Intra-op Plan:   Post-operative Plan:   Informed Consent: I have reviewed the patients History and Physical, chart, labs and discussed the procedure including the risks, benefits and alternatives for the proposed anesthesia with the patient or authorized representative who has indicated his/her understanding and acceptance.     Plan Discussed with: CRNA  Anesthesia Plan Comments:         Anesthesia Quick Evaluation                                  Anesthesia Evaluation  Patient identified by MRN, date of birth, ID band  Reviewed: NPO status   History of Anesthesia Complications Negative for: history of anesthetic complications  Airway Mallampati: II  TM Distance: >3 FB Neck  ROM: full    Dental  (+) Missing,    Pulmonary COPD (mild)   Pulmonary exam normal       Cardiovascular Exercise Tolerance: Good hypertension, + CAD, + Cardiac Stents (x2 2012) and +CHF (ef=35%) Normal cardiovascular exam    Neuro/Psych negative neurological ROS  negative psych ROS   GI/Hepatic Neg liver ROS, GERD-  Controlled,  Endo/Other  negative endocrine ROS  Renal/GU Dialysis and ESRFRenal disease (last HD 8/23)  negative genitourinary   Musculoskeletal  (+) Arthritis -, Gout    Abdominal   Peds  Hematology Prostate cancer; Renal tumor   Anesthesia Other Findings echo: 03/2015: ef=35%; MODERATE LV SYSTOLIC DYSFUNCTION (See above)NORMAL RIGHT VENTRICULAR SYSTOLIC FUNCTIONMILD VALVULAR REGURGITATION; NO VALVULAR STENOSIS;  cards stable: Flossie Dibble, MD - 08/15/2015;     Reproductive/Obstetrics                             Anesthesia Physical Anesthesia Plan  ASA: III  Anesthesia Plan: MAC   Post-op Pain Management:    Induction:   Airway Management Planned:   Additional Equipment:   Intra-op Plan:   Post-operative Plan:   Informed Consent: I have reviewed the patients History and Physical, chart, labs and discussed the procedure including the risks, benefits and alternatives for the proposed anesthesia with the patient or  authorized representative who has indicated his/her understanding and acceptance.     Plan Discussed with: CRNA  Anesthesia Plan Comments:         Anesthesia Quick Evaluation

## 2015-09-10 NOTE — Anesthesia Postprocedure Evaluation (Signed)
  Anesthesia Post-op Note  Patient: Maurice Peterson  Procedure(s) Performed: Procedure(s): CATARACT EXTRACTION PHACO AND INTRAOCULAR LENS PLACEMENT (IOC) (Left)  Anesthesia type:MAC  Patient location: PACU  Post pain: Pain level controlled  Post assessment: Post-op Vital signs reviewed, Patient's Cardiovascular Status Stable, Respiratory Function Stable, Patent Airway and No signs of Nausea or vomiting  Post vital signs: Reviewed and stable  Last Vitals:  Filed Vitals:   09/10/15 1015  BP: 143/93  Pulse: 95  Temp: 36.3 C  Resp: 12    Level of consciousness: awake, alert  and patient cooperative  Complications: No apparent anesthesia complications

## 2015-09-10 NOTE — Anesthesia Procedure Notes (Signed)
Procedure Name: MAC Performed by: Reilyn Nelson Pre-anesthesia Checklist: Patient identified, Emergency Drugs available, Suction available, Timeout performed and Patient being monitored Patient Re-evaluated:Patient Re-evaluated prior to inductionOxygen Delivery Method: Nasal cannula Placement Confirmation: positive ETCO2     

## 2015-09-10 NOTE — Op Note (Signed)
OPERATIVE NOTE  GERASIMOS PLOTTS [id number] 09/10/2015   PREOPERATIVE DIAGNOSIS:  Nuclear sclerotic cataract left eye. H25.12   POSTOPERATIVE DIAGNOSIS:    Nuclear sclerotic cataract left eye.     PROCEDURE:  Phacoemusification with posterior chamber intraocular lens placement of the left eye   LENS:   Implant Name Type Inv. Item Serial No. Manufacturer Lot No. LRB No. Used  LENS IMPL INTRAOC [id number] 22.0 - [id number] Intraocular Lens LENS IMPL INTRAOC ZCB00 22.0 [id number] AMO   Left 1        ULTRASOUND TIME: 14.5  % of 1 minutes 30 seconds, CDE 13.2  SURGEON:  Wyonia Hough, MD   ANESTHESIA:  Topical with tetracaine drops and 2% Xylocaine jelly.   COMPLICATIONS:  None.   DESCRIPTION OF PROCEDURE:  The patient was identified in the holding room and transported to the operating room and placed in the supine position under the operating microscope.  The left eye was identified as the operative eye and it was prepped and draped in the usual sterile ophthalmic fashion.   A 1 millimeter clear-corneal paracentesis was made at the 1:30 position.  The anterior chamber was filled with Viscoat viscoelastic.  A 2.4 millimeter keratome was used to make a near-clear corneal incision at the 10:30 position.  .  A curvilinear capsulorrhexis was made with a cystotome and capsulorrhexis forceps.  Balanced salt solution was used to hydrodissect and hydrodelineate the nucleus.   Phacoemulsification was then used in stop and chop fashion to remove the lens nucleus and epinucleus.  The remaining cortex was then removed using the irrigation and aspiration handpiece. Provisc was then placed into the capsular bag to distend it for lens placement.  A lens was then injected into the capsular bag.  The remaining viscoelastic was aspirated.   Wounds were hydrated with balanced salt solution.  The anterior chamber was inflated to a physiologic pressure with balanced salt solution.  No wound leaks were noted.  Cefuroxime 0.1 ml of a 10mg /ml solution was injected into the anterior chamber for a dose of 1 mg of intracameral antibiotic at the completion of the case.   Timolol and Brimonidine drops were applied to the eye.  The patient was taken to the recovery room in stable condition without complications of anesthesia or surgery.  Kealan Buchan 09/10/2015, 10:13 AM

## 2015-09-11 ENCOUNTER — Encounter: Payer: Self-pay | Admitting: Ophthalmology

## 2015-10-20 DIAGNOSIS — E782 Mixed hyperlipidemia: Secondary | ICD-10-CM | POA: Insufficient documentation

## 2015-12-09 ENCOUNTER — Other Ambulatory Visit: Payer: Self-pay | Admitting: Vascular Surgery

## 2016-01-06 ENCOUNTER — Encounter
Admission: RE | Disposition: A | Discharge status: Home / Self Care | Payer: Self-pay | Source: Ambulatory Visit | Attending: Vascular Surgery

## 2016-01-06 ENCOUNTER — Ambulatory Visit
Admission: RE | Admit: 2016-01-06 | Discharge: 2016-01-06 | Disposition: A | Discharge status: Home / Self Care | Payer: Medicare Other | Source: Ambulatory Visit | Attending: Vascular Surgery | Admitting: Vascular Surgery

## 2016-01-06 ENCOUNTER — Encounter: Payer: Self-pay | Admitting: *Deleted

## 2016-01-06 DIAGNOSIS — T82858A Stenosis of vascular prosthetic devices, implants and grafts, initial encounter: Secondary | ICD-10-CM | POA: Insufficient documentation

## 2016-01-06 DIAGNOSIS — I120 Hypertensive chronic kidney disease with stage 5 chronic kidney disease or end stage renal disease: Secondary | ICD-10-CM | POA: Insufficient documentation

## 2016-01-06 DIAGNOSIS — Z7901 Long term (current) use of anticoagulants: Secondary | ICD-10-CM | POA: Diagnosis not present

## 2016-01-06 DIAGNOSIS — Z7982 Long term (current) use of aspirin: Secondary | ICD-10-CM | POA: Diagnosis not present

## 2016-01-06 DIAGNOSIS — J449 Chronic obstructive pulmonary disease, unspecified: Secondary | ICD-10-CM | POA: Insufficient documentation

## 2016-01-06 DIAGNOSIS — E669 Obesity, unspecified: Secondary | ICD-10-CM | POA: Insufficient documentation

## 2016-01-06 DIAGNOSIS — Y832 Surgical operation with anastomosis, bypass or graft as the cause of abnormal reaction of the patient, or of later complication, without mention of misadventure at the time of the procedure: Secondary | ICD-10-CM | POA: Insufficient documentation

## 2016-01-06 DIAGNOSIS — Z87891 Personal history of nicotine dependence: Secondary | ICD-10-CM | POA: Diagnosis not present

## 2016-01-06 DIAGNOSIS — E785 Hyperlipidemia, unspecified: Secondary | ICD-10-CM | POA: Diagnosis not present

## 2016-01-06 DIAGNOSIS — Z992 Dependence on renal dialysis: Secondary | ICD-10-CM | POA: Insufficient documentation

## 2016-01-06 DIAGNOSIS — Z8546 Personal history of malignant neoplasm of prostate: Secondary | ICD-10-CM | POA: Diagnosis not present

## 2016-01-06 DIAGNOSIS — Z79899 Other long term (current) drug therapy: Secondary | ICD-10-CM | POA: Diagnosis not present

## 2016-01-06 DIAGNOSIS — N186 End stage renal disease: Secondary | ICD-10-CM | POA: Insufficient documentation

## 2016-01-06 DIAGNOSIS — Z7902 Long term (current) use of antithrombotics/antiplatelets: Secondary | ICD-10-CM | POA: Insufficient documentation

## 2016-01-06 HISTORY — PX: PERIPHERAL VASCULAR CATHETERIZATION: SHX172C

## 2016-01-06 LAB — POTASSIUM (ARMC VASCULAR LAB ONLY): Potassium (ARMC vascular lab): 3.9 (ref 3.5–5.1)

## 2016-01-06 SURGERY — A/V SHUNTOGRAM/FISTULAGRAM
Anesthesia: Moderate Sedation

## 2016-01-06 MED ORDER — LIDOCAINE HCL 1 % IJ SOLN
INTRAMUSCULAR | Status: DC | PRN
  Administered 2016-01-06: 5 mL via INTRADERMAL

## 2016-01-06 MED ORDER — HEPARIN (PORCINE) IN NACL 2-0.9 UNIT/ML-% IJ SOLN
INTRAMUSCULAR | Status: AC
  Filled 2016-01-06: qty 1000

## 2016-01-06 MED ORDER — ONDANSETRON HCL 4 MG/2ML IJ SOLN: 4.0000 mg | Freq: Four times a day (QID) | INTRAMUSCULAR | Status: DC | PRN

## 2016-01-06 MED ORDER — FENTANYL CITRATE (PF) 100 MCG/2ML IJ SOLN
INTRAMUSCULAR | Status: DC | PRN
  Administered 2016-01-06 (×2): 50 ug via INTRAVENOUS

## 2016-01-06 MED ORDER — HYDROMORPHONE HCL 1 MG/ML IJ SOLN: 1.0000 mg | Freq: Once | INTRAMUSCULAR | Status: DC

## 2016-01-06 MED ORDER — HYDROCOD POLST-CPM POLST ER 10-8 MG/5ML PO SUER
ORAL | Status: AC
  Filled 2016-01-06: qty 5

## 2016-01-06 MED ORDER — FAMOTIDINE 20 MG PO TABS: 40.0000 mg | ORAL_TABLET | ORAL | Status: DC | PRN

## 2016-01-06 MED ORDER — SODIUM CHLORIDE 0.9 % IV SOLN
INTRAVENOUS | Status: DC
  Administered 2016-01-06: 11:00:00 via INTRAVENOUS

## 2016-01-06 MED ORDER — DEXTROSE 5 % IV SOLN
1.5000 g | INTRAVENOUS | Status: AC
  Administered 2016-01-06: 1.5 g via INTRAVENOUS

## 2016-01-06 MED ORDER — IOHEXOL 300 MG/ML  SOLN
INTRAMUSCULAR | Status: DC | PRN
Start: 2016-01-06 — End: 2016-01-06
  Administered 2016-01-06: 15 mL via INTRAVENOUS

## 2016-01-06 MED ORDER — FENTANYL CITRATE (PF) 100 MCG/2ML IJ SOLN
INTRAMUSCULAR | Status: AC
  Filled 2016-01-06: qty 2

## 2016-01-06 MED ORDER — METHYLPREDNISOLONE SODIUM SUCC 125 MG IJ SOLR: 125.0000 mg | INTRAMUSCULAR | Status: DC | PRN

## 2016-01-06 MED ORDER — LIDOCAINE HCL (PF) 1 % IJ SOLN
INTRAMUSCULAR | Status: AC
  Filled 2016-01-06: qty 30

## 2016-01-06 MED ORDER — HYDROCOD POLST-CPM POLST ER 10-8 MG/5ML PO SUER
5.0000 mL | Freq: Once | ORAL | Status: AC
  Administered 2016-01-06: 5 mL via ORAL

## 2016-01-06 MED ORDER — MIDAZOLAM HCL 5 MG/5ML IJ SOLN
INTRAMUSCULAR | Status: AC
  Filled 2016-01-06: qty 5

## 2016-01-06 MED ORDER — MIDAZOLAM HCL 2 MG/2ML IJ SOLN
INTRAMUSCULAR | Status: DC | PRN
  Administered 2016-01-06: 1 mg via INTRAVENOUS
  Administered 2016-01-06: 2 mg via INTRAVENOUS

## 2016-01-06 MED ORDER — HEPARIN SODIUM (PORCINE) 1000 UNIT/ML IJ SOLN
INTRAMUSCULAR | Status: DC | PRN
  Administered 2016-01-06: 3000 [IU] via INTRAVENOUS

## 2016-01-06 SURGICAL SUPPLY — 15 items
BALLN DORADO 7X20X80 (BALLOONS) ×4
BALLN DORADO 8X40X80 (BALLOONS) ×4
BALLOON DORADO 7X20X80 (BALLOONS) ×2 IMPLANT
BALLOON DORADO 8X40X80 (BALLOONS) ×2 IMPLANT
CATH TORCON 5FR 0.38 (CATHETERS) ×4 IMPLANT
DEVICE PRESTO INFLATION (MISCELLANEOUS) ×4 IMPLANT
DRAPE BRACHIAL (DRAPES) ×4 IMPLANT
GUIDEWIRE ANGLED .035 180CM (WIRE) ×4 IMPLANT
PACK ANGIOGRAPHY (CUSTOM PROCEDURE TRAY) ×4 IMPLANT
SET INTRO CAPELLA COAXIAL (SET/KITS/TRAYS/PACK) ×4 IMPLANT
SHEATH BRITE TIP 6FRX5.5 (SHEATH) ×4 IMPLANT
SHEATH BRITE TIP 7FRX5.5 (SHEATH) ×4 IMPLANT
STENT VIABAHN 8X50X120 (Permanent Stent) ×4 IMPLANT
TOWEL OR 17X26 4PK STRL BLUE (TOWEL DISPOSABLE) ×4 IMPLANT
WIRE G 018X200 V18 (WIRE) ×4 IMPLANT

## 2016-01-06 NOTE — Op Note (Signed)
OPERATIVE NOTE   PROCEDURE: 1. Contrast injection left radiocephalic  AV access 2. Percutaneous transluminal angioplasty and stent placement left radiocephalic fistula  PRE-OPERATIVE DIAGNOSIS: Complication of dialysis access                                                       End Stage Renal Disease  POST-OPERATIVE DIAGNOSIS: same as above   SURGEON: Katha Cabal, M.D.  ANESTHESIA: Conscious sedation was administered under my direct supervision. IV Versed plus fentanyl were utilized. Continuous ECG, pulse oximetry and blood pressure was monitored throughout the entire procedure. A total of 3 milligrams of Versed and 100 micrograms of fentanyl were utilized.  Conscious sedation was begun at  12:26 PM and concluded at 1:07 PM for a total of 41 minutes.  ESTIMATED BLOOD LOSS: minimal  FINDING(S): 1. String sign located in the midportion of the fistula  SPECIMEN(S):  None  CONTRAST: 15 cc  FLUOROSCOPY TIME: 1.9 minutes  INDICATIONS: Maurice Peterson is a 75 y.o. male who  presents with malfunctioning left wrist AV access.  The patient is scheduled for angiography with possible intervention of the AV access.  The patient is aware the risks include but are not limited to: bleeding, infection, thrombosis of the cannulated access, and possible anaphylactic reaction to the contrast.  The patient acknowledges if the access can not be salvaged a tunneled catheter will be needed and will be placed during this procedure.  The patient is aware of the risks of the procedure and elects to proceed with the angiogram and intervention.  DESCRIPTION: After full informed written consent was obtained, the patient was brought back to the Special Procedure suite and placed supine position.  Appropriate cardiopulmonary monitors were placed.  The left arm was prepped and draped in the standard fashion.  Appropriate timeout is called. The left AV fistula  was cannulated with a micropuncture needle near the  elbow with ultrasound guidance.  The microwire was advanced and the needle was exchanged for  a microsheath.  The J-wire was then advanced and a 6 Fr sheath inserted.  Hand injections were completed to image the access from the arterial anastomosis through the entire access.  The central venous structures were also imaged by hand injections.  Based on the images,  3000 units of heparin was given and a wire was negotiated through the strictures within the venous portion of the graft.  An 8 mm x 50 mm Viabahn was deployed across the stenoses and postdilated with an 8 mm Dorado balloon.  Follow-up imaging demonstrates complete resolution of the stricture with rapid flow of contrast through the graft, the central venous anatomy is preserved.  A 4-0 Monocryl purse-string suture was sewn around the sheath.  The sheath was removed and light pressure was applied.  A sterile bandage was applied to the puncture site.    COMPLICATIONS: None  CONDITION: Maurice Peterson, M.D [facility] Office: [phone]  01/06/2016 1:44 PM

## 2016-01-06 NOTE — Discharge Instructions (Signed)
Fistulogram, Care After °Refer to this sheet in the next few weeks. These instructions provide you with information on caring for yourself after your procedure. Your health care provider may also give you more specific instructions. Your treatment has been planned according to current medical practices, but problems sometimes occur. Call your health care provider if you have any problems or questions after your procedure. °WHAT TO EXPECT AFTER THE PROCEDURE °After your procedure, it is typical to have the following: °· A small amount of discomfort in the area where the catheters were placed. °· A small amount of bruising around the fistula. °· Sleepiness and fatigue. °HOME CARE INSTRUCTIONS °· Rest at home for the day following your procedure. °· Do not drive or operate heavy machinery while taking pain medicine. °· Take medicines only as directed by your health care provider. °· Do not take baths, swim, or use a hot tub until your health care provider approves. You may shower 24 hours after the procedure or as directed by your health care provider. °· There are many different ways to close and cover an incision, including stitches, skin glue, and adhesive strips. Follow your health care provider's instructions on: °¨ Incision care. °¨ Bandage (dressing) changes and removal. °¨ Incision closure removal. °· Monitor your dialysis fistula carefully. °SEEK MEDICAL CARE IF: °· You have drainage, redness, swelling, or pain at your catheter site. °· You have a fever. °· You have chills. °SEEK IMMEDIATE MEDICAL CARE IF: °· You feel weak. °· You have trouble balancing. °· You have trouble moving your arms or legs. °· You have problems with your speech or vision. °· You can no longer feel a vibration or buzz when you put your fingers over your dialysis fistula. °· The limb that was used for the procedure: °¨ Swells. °¨ Is painful. °¨ Is cold. °¨ Is discolored, such as blue or pale white. °  °This information is not intended  to replace advice given to you by your health care provider. Make sure you discuss any questions you have with your health care provider. °  °Document Released: 04/29/2014 Document Reviewed: 04/29/2014 °Elsevier Interactive Patient Education ©2016 [employer] ° °

## 2016-01-12 ENCOUNTER — Encounter: Payer: Self-pay | Admitting: Vascular Surgery

## 2016-04-12 DIAGNOSIS — R600 Localized edema: Secondary | ICD-10-CM | POA: Insufficient documentation

## 2016-11-02 ENCOUNTER — Other Ambulatory Visit (INDEPENDENT_AMBULATORY_CARE_PROVIDER_SITE_OTHER): Payer: Self-pay | Admitting: Vascular Surgery

## 2016-11-02 DIAGNOSIS — T829XXA Unspecified complication of cardiac and vascular prosthetic device, implant and graft, initial encounter: Secondary | ICD-10-CM

## 2016-11-02 DIAGNOSIS — Z992 Dependence on renal dialysis: Secondary | ICD-10-CM

## 2016-11-02 DIAGNOSIS — N186 End stage renal disease: Secondary | ICD-10-CM

## 2016-11-08 ENCOUNTER — Ambulatory Visit (INDEPENDENT_AMBULATORY_CARE_PROVIDER_SITE_OTHER): Payer: Medicare Other | Admitting: Vascular Surgery

## 2016-11-08 ENCOUNTER — Encounter (INDEPENDENT_AMBULATORY_CARE_PROVIDER_SITE_OTHER): Payer: Self-pay | Admitting: Vascular Surgery

## 2016-11-08 ENCOUNTER — Ambulatory Visit (INDEPENDENT_AMBULATORY_CARE_PROVIDER_SITE_OTHER): Payer: Medicare Other

## 2016-11-08 DIAGNOSIS — T829XXA Unspecified complication of cardiac and vascular prosthetic device, implant and graft, initial encounter: Secondary | ICD-10-CM | POA: Diagnosis not present

## 2016-11-08 DIAGNOSIS — N186 End stage renal disease: Secondary | ICD-10-CM | POA: Diagnosis not present

## 2016-11-08 DIAGNOSIS — T829XXS Unspecified complication of cardiac and vascular prosthetic device, implant and graft, sequela: Secondary | ICD-10-CM

## 2016-11-08 DIAGNOSIS — E7800 Pure hypercholesterolemia, unspecified: Secondary | ICD-10-CM | POA: Diagnosis not present

## 2016-11-08 DIAGNOSIS — I10 Essential (primary) hypertension: Secondary | ICD-10-CM | POA: Insufficient documentation

## 2016-11-08 DIAGNOSIS — Z992 Dependence on renal dialysis: Secondary | ICD-10-CM | POA: Diagnosis not present

## 2016-11-08 DIAGNOSIS — N189 Chronic kidney disease, unspecified: Secondary | ICD-10-CM | POA: Insufficient documentation

## 2016-11-08 NOTE — Progress Notes (Signed)
MRN : [id number]  Maurice Peterson is a 75 y.o. ([birth year]) male who presents with chief complaint of  Chief Complaint  Patient presents with  . Re-evaluation    6 month follow up  .  History of Present Illness: The patient returns to the office for followup of their dialysis access. The function of the access has been stable. The patient denies increased bleeding time or increased recirculation. Patient denies difficulty with cannulation, he does note some people have no problems with access and others struggle somewhat. The patient denies hand pain or other symptoms consistent with steal phenomena. No significant arm swelling.  The patient denies redness or swelling at the access site. The patient denies fever or chills at home or while on dialysis.  The patient denies amaurosis fugax or recent TIA symptoms. There are no recent neurological changes noted. The patient denies claudication symptoms or rest pain symptoms. The patient denies history of DVT, PE or superficial thrombophlebitis. The patient denies recent episodes of angina or shortness of breath.  Duplex ultrasound of the AV access shows a patent access with uniform velocities. No focal hemodynamically significant stenosis, no change from last study  Current Meds  Medication Sig  . albuterol (PROVENTIL HFA;VENTOLIN HFA) 108 (90 BASE) MCG/ACT inhaler Inhale 2 puffs into the lungs every 4 (four) hours as needed for wheezing or shortness of breath.  [name] amLODipine (NORVASC) 5 MG tablet Take 5 mg by mouth daily. Reported on 01/06/2016  . aspirin 81 MG tablet Take 81 mg by mouth daily.  [name] atorvastatin (LIPITOR) 10 MG tablet Take 10 mg by mouth daily.  [name] azelastine (OPTIVAR) 0.05 % ophthalmic solution Place 1 drop into both eyes 2 (two) times daily.  . budesonide-formoterol (SYMBICORT) 160-4.5 MCG/ACT inhaler Inhale 2 puffs into the lungs 2 (two) times daily.  . calcium carbonate (TUMS - DOSED IN MG ELEMENTAL CALCIUM) 500 MG chewable  tablet Chew 1 tablet by mouth 2 (two) times daily.  . Cholecalciferol (VITAMIN D3) 1000 UNITS CAPS Take 2,000 Units by mouth 2 (two) times daily.  . colchicine 0.6 MG tablet Take 0.6 mg by mouth as needed. Reported on 01/06/2016  . febuxostat (ULORIC) 40 MG tablet Take 40 mg by mouth daily. Sun, Mon, Wed, Fri  . furosemide (LASIX) 20 MG tablet Take 20 mg by mouth. Tues, Thurs, Sat  . furosemide (LASIX) 40 MG tablet Take 40 mg by mouth.  . losartan (COZAAR) 25 MG tablet Take 25 mg by mouth daily.  . metoprolol (LOPRESSOR) 50 MG tablet Take 50 mg by mouth daily.  . montelukast (SINGULAIR) 10 MG tablet Take 10 mg by mouth at bedtime.  . multivitamin (RENA-VIT) TABS tablet Take 1 tablet by mouth daily.  . pantoprazole (PROTONIX) 40 MG tablet Take 40 mg by mouth daily.    Past Medical History:  Diagnosis Date  . Arthritis   . Chronic kidney disease    requires dialysis  . COPD (chronic obstructive pulmonary disease) ([place])   . GERD (gastroesophageal reflux disease)   . Hyperlipidemia   . Hypertension   . Prostate cancer ([place])   . Shortness of breath dyspnea     Past Surgical History:  Procedure Laterality Date  . CATARACT EXTRACTION W/PHACO Right 08/20/2015   Procedure: CATARACT EXTRACTION PHACO AND INTRAOCULAR LENS PLACEMENT ([place]);  Surgeon: Leandrew Koyanagi, MD;  Location: [place];  Service: Ophthalmology;  Laterality: Right;  . CATARACT EXTRACTION W/PHACO Left 09/10/2015   Procedure: CATARACT EXTRACTION PHACO AND INTRAOCULAR  LENS PLACEMENT ([facility]);  Surgeon: Leandrew Koyanagi, MD;  Location: [place];  Service: Ophthalmology;  Laterality: Left;  . CORONARY STENT PLACEMENT    . 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 PERIPHERAL VASCULAR CATHETERIZATION Left 05/20/2015   Procedure: A/V Shuntogram/Fistulagram;  Surgeon: Katha Cabal, MD;  Location: [facility] [facility];  Service: Cardiovascular;  Laterality: Left;  . PERIPHERAL VASCULAR  CATHETERIZATION N/A 05/20/2015   Procedure: A/V Shunt Intervention;  Surgeon: Katha Cabal, MD;  Location: [place] [facility];  Service: Cardiovascular;  Laterality: N/A;  . PERIPHERAL VASCULAR CATHETERIZATION Left 08/05/2015   Procedure: A/V Shuntogram/Fistulagram;  Surgeon: Katha Cabal, MD;  Location: [facility] [place] [facility];  Service: Cardiovascular;  Laterality: Left;  . PERIPHERAL VASCULAR CATHETERIZATION N/A 08/05/2015   Procedure: A/V Shunt Intervention;  Surgeon: Katha Cabal, MD;  Location: [facility];  Service: Cardiovascular;  Laterality: N/A;  . PERIPHERAL VASCULAR CATHETERIZATION Left 01/06/2016   Procedure: A/V Shuntogram/Fistulagram;  Surgeon: Katha Cabal, MD;  Location: [place] [facility];  Service: Cardiovascular;  Laterality: Left;  . PERIPHERAL VASCULAR CATHETERIZATION N/A 01/06/2016   Procedure: A/V Shunt Intervention;  Surgeon: Katha Cabal, MD;  Location: [facility];  Service: Cardiovascular;  Laterality: N/A;    Social History Social History  Substance Use Topics  . Smoking status: Former Smoker    Packs/day: 1.00    Types: Cigarettes    Quit date: 05/19/2009  . Smokeless tobacco: Never Used  . Alcohol use No    Family History No family history on file. No family history of bleeding/clotting disorders, porphyria or autoimmune disease  Allergies  Allergen Reactions  . Ace Inhibitors Nausea And Vomiting  . Amoxicillin-Pot Clavulanate Nausea And Vomiting  . Other Nausea And Vomiting    Anti-inflammatories     REVIEW OF SYSTEMS (Negative unless checked)  Constitutional: [] Weight loss  [] Fever  [] Chills Cardiac: [] Chest pain   [] Chest pressure   [] Palpitations   [] Shortness of breath when laying flat   [] Shortness of breath with exertion. Vascular:  [] Pain in legs with walking   [] Pain in legs at rest  [] History of DVT   [] Phlebitis   [] Swelling in legs   [] Varicose veins   [] Non-healing ulcers Pulmonary:   [] Uses home  oxygen   [] Productive cough   [] Hemoptysis   [] Wheeze  [] COPD   [] Asthma Neurologic:  [] Dizziness   [] Seizures   [] History of stroke   [] History of TIA  [] Aphasia   [] Vissual changes   [] Weakness or numbness in arm   [] Weakness or numbness in leg Musculoskeletal:   [] Joint swelling   [x] Joint pain   [] Low back pain Hematologic:  [] Easy bruising  [] Easy bleeding   [] Hypercoagulable state   [] Anemic Gastrointestinal:  [] Diarrhea   [] Vomiting  [] Gastroesophageal reflux/heartburn   [] Difficulty swallowing. Genitourinary:  [x] Chronic kidney disease   [] Difficult urination  [] Frequent urination   [] Blood in urine Skin:  [] Rashes   [] Ulcers  Psychological:  [] History of anxiety   []  History of major depression.  Physical Examination  Vitals:   11/08/16 1000  BP: 113/69  Pulse: 74  Resp: 16  Weight: 247 lb (112 kg)  Height: 5\' 11"  (1.803 m)   Body mass index is 34.45 kg/m. Gen: WD/WN, NAD Head: [place]/AT, No temporalis wasting.  Ear/Nose/Throat: Hearing grossly intact, nares w/o erythema or drainage, poor dentition Eyes: PER, EOMI, sclera nonicteric.  Neck: Supple, no masses.  No bruit or JVD.  Pulmonary:  Good air movement, clear to auscultation bilaterally, no use of accessory muscles.  Cardiac: RRR, normal S1, S2, no Murmurs. Vascular: left radial cephalic fistula good thrill good bruit, minimal pulsatility   Vessel Right Left  Radial Palpable Palpable  Ulnar Not Palpable Not Palpable  Brachial Palpable Palpable  Carotid Palpable Palpable  Femoral Palpable Palpable  Popliteal Palpable Palpable  PT Palpable Palpable  DP Palpable Palpable   Gastrointestinal: soft, non-distended. No guarding/no peritoneal signs.  Musculoskeletal: M/S 5/5 throughout.  No deformity or atrophy.  Neurologic: CN 2-12 intact. Pain and light touch intact in extremities.  Symmetrical.  Speech is fluent. Motor exam as listed above. Psychiatric: Judgment intact, Mood & affect appropriate for pt's clinical  situation. Dermatologic: No rashes or ulcers noted.  No changes consistent with cellulitis. Lymph : No Cervical lymphadenopathy, no lichenification or skin changes of chronic lymphedema.  CBC Lab Results  Component Value Date   WBC 15.8 (H) 07/25/2014   HGB 12.5 (L) 07/25/2014   HCT 39.7 (L) 07/25/2014   MCV 85 07/25/2014   PLT 200 07/25/2014    BMET    Component Value Date/Time   NA 141 12/17/2014 1034   K 4.5 12/17/2014 1034   CL 108 (H) 12/17/2014 1034   CO2 26 12/17/2014 1034   GLUCOSE 98 12/17/2014 1034   BUN 45 (H) 12/17/2014 1034   CREATININE 4.30 (H) 12/17/2014 1034   CALCIUM 7.7 (L) 12/17/2014 1034   GFRNONAA 15 (L) 12/17/2014 1034   GFRNONAA 15 (L) 07/25/2014 1040   GFRAA 18 (L) 12/17/2014 1034   GFRAA 17 (L) 07/25/2014 1040   CrCl cannot be calculated (Patient's most recent lab result is older than the maximum 21 days allowed.).  COAG No results found for: INR, PROTIME  Radiology No results found.  Assessment/Plan 1. End stage renal disease ([place]) Recommend:  The patient is doing well and currently has adequate dialysis access. His duplex ultrasound looks very good with uniform velocities.  The patient should have a duplex ultrasound of the dialysis access in 6 months.  The patient will follow-up with me in the office after each ultrasound  - VAS [place] (AVF,AVG); Future  2. Complication of vascular access for dialysis, sequela See #1 - VAS US DUPLEX DIALYSIS ACCESS (AVF,AVG); Future  3. Essential hypertension, benign Continue antihypertensive medications as already ordered and reviewed, no changes at this time.  4. Hypercholesterolemia Continue statin as ordered and reviewed, no changes at this time    Hortencia Pilar, MD  11/08/2016 1:10 PM

## 2016-11-20 ENCOUNTER — Encounter: Payer: Self-pay | Admitting: Emergency Medicine

## 2016-11-20 ENCOUNTER — Emergency Department: Payer: Medicare Other

## 2016-11-20 ENCOUNTER — Emergency Department
Admission: EM | Admit: 2016-11-20 | Discharge: 2016-11-20 | Disposition: A | Discharge status: Home / Self Care | Payer: Medicare Other | Attending: Emergency Medicine | Admitting: Emergency Medicine

## 2016-11-20 DIAGNOSIS — Y828 Other medical devices associated with adverse incidents: Secondary | ICD-10-CM | POA: Diagnosis not present

## 2016-11-20 DIAGNOSIS — Z7982 Long term (current) use of aspirin: Secondary | ICD-10-CM | POA: Insufficient documentation

## 2016-11-20 DIAGNOSIS — Z79899 Other long term (current) drug therapy: Secondary | ICD-10-CM | POA: Diagnosis not present

## 2016-11-20 DIAGNOSIS — Z87891 Personal history of nicotine dependence: Secondary | ICD-10-CM | POA: Diagnosis not present

## 2016-11-20 DIAGNOSIS — Z8546 Personal history of malignant neoplasm of prostate: Secondary | ICD-10-CM | POA: Insufficient documentation

## 2016-11-20 DIAGNOSIS — I120 Hypertensive chronic kidney disease with stage 5 chronic kidney disease or end stage renal disease: Secondary | ICD-10-CM | POA: Diagnosis not present

## 2016-11-20 DIAGNOSIS — T8249XA Other complication of vascular dialysis catheter, initial encounter: Secondary | ICD-10-CM | POA: Diagnosis present

## 2016-11-20 DIAGNOSIS — J449 Chronic obstructive pulmonary disease, unspecified: Secondary | ICD-10-CM | POA: Insufficient documentation

## 2016-11-20 DIAGNOSIS — Z789 Other specified health status: Secondary | ICD-10-CM

## 2016-11-20 DIAGNOSIS — Z992 Dependence on renal dialysis: Secondary | ICD-10-CM | POA: Insufficient documentation

## 2016-11-20 DIAGNOSIS — N186 End stage renal disease: Secondary | ICD-10-CM | POA: Diagnosis not present

## 2016-11-20 LAB — CBC WITH DIFFERENTIAL/PLATELET
BASOS ABS: 0.1 10*3/uL (ref 0–0.1)
BASOS PCT: 1 %
EOS ABS: 0.1 10*3/uL (ref 0–0.7)
Eosinophils Relative: 1 %
HEMATOCRIT: 35.4 % — AB (ref 40.0–52.0)
HEMOGLOBIN: 12.1 g/dL — AB (ref 13.0–18.0)
Lymphocytes Relative: 20 %
Lymphs Abs: 1.9 10*3/uL (ref 1.0–3.6)
MCH: 31.2 pg (ref 26.0–34.0)
MCHC: 34.1 g/dL (ref 32.0–36.0)
MCV: 91.6 fL (ref 80.0–100.0)
MONOS PCT: 8 %
Monocytes Absolute: 0.8 10*3/uL (ref 0.2–1.0)
NEUTROS ABS: 6.6 10*3/uL — AB (ref 1.4–6.5)
NEUTROS PCT: 70 %
Platelets: 195 10*3/uL (ref 150–440)
RBC: 3.87 MIL/uL — AB (ref 4.40–5.90)
RDW: 14.7 % — ABNORMAL HIGH (ref 11.5–14.5)
WBC: 9.5 10*3/uL (ref 3.8–10.6)

## 2016-11-20 LAB — BASIC METABOLIC PANEL
ANION GAP: 10 (ref 5–15)
BUN: 57 mg/dL — ABNORMAL HIGH (ref 6–20)
CHLORIDE: 99 mmol/L — AB (ref 101–111)
CO2: 28 mmol/L (ref 22–32)
CREATININE: 7.7 mg/dL — AB (ref 0.61–1.24)
Calcium: 7.9 mg/dL — ABNORMAL LOW (ref 8.9–10.3)
GFR calc non Af Amer: 6 mL/min — ABNORMAL LOW (ref >60)
GFR, EST AFRICAN AMERICAN: 7 mL/min — AB (ref >60)
Glucose, Bld: 95 mg/dL (ref 65–99)
Potassium: 4.8 mmol/L (ref 3.5–5.1)
SODIUM: 137 mmol/L (ref 135–145)

## 2016-11-20 NOTE — ED Triage Notes (Signed)
Pt reports that he went to dialysis this AM and was informed that his port had clotted off by the nurse at the center. Pt is ambulatory to triage with NAD noted at this time.

## 2016-11-20 NOTE — Discharge Instructions (Signed)
Please follow up as directed for vascular evaluation. Please try to fluid restrict as suggested by a nephrologist. It sounds as though follow-up. We arranged through the dialysis center.

## 2016-11-20 NOTE — ED Notes (Signed)

## 2016-11-20 NOTE — ED Provider Notes (Signed)
Time Seen: Approximately 0719  I have reviewed the triage notes  Chief Complaint: Vascular Access Problem   History of Present Illness: Maurice Peterson is a 75 y.o. male *who receives his dialysis normally on Tuesday, Thursdays, and Saturdays. Patient's last dialysis was on Wednesday prior to the holiday weekend. Patient states he received a full dialysis treatment and currently only 2-3 pounds above his baseline weight. He went to the dialysis center this morning and had difficulty accessing his left upper extremity AV natural fistula. He states historically he has 2 stents in this vascular site. He states he's been doing well especially since January of this year. He denies any shortness of breath, nausea, vomiting Past Medical History:  Diagnosis Date  . Arthritis   . Chronic kidney disease    requires dialysis  . COPD (chronic obstructive pulmonary disease) ([place])   . GERD (gastroesophageal reflux disease)   . Hyperlipidemia   . Hypertension   . Prostate cancer ([place])   . Shortness of breath dyspnea     Patient Active Problem List   Diagnosis Date Noted  . End stage renal disease ([place]) 11/08/2016  . Complication of vascular access for dialysis 11/08/2016  . Essential hypertension, benign 11/08/2016  . Hypercholesterolemia 11/08/2016    Past Surgical History:  Procedure Laterality Date  . CATARACT EXTRACTION W/PHACO Right 08/20/2015   Procedure: CATARACT EXTRACTION PHACO AND INTRAOCULAR LENS PLACEMENT ([place]);  Surgeon: Leandrew Koyanagi, MD;  Location: [place];  Service: Ophthalmology;  Laterality: Right;  . CATARACT EXTRACTION W/PHACO Left 09/10/2015   Procedure: CATARACT EXTRACTION PHACO AND INTRAOCULAR LENS PLACEMENT ([facility]);  Surgeon: Leandrew Koyanagi, MD;  Location: [place];  Service: Ophthalmology;  Laterality: Left;  . CORONARY STENT PLACEMENT    . 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 PERIPHERAL VASCULAR CATHETERIZATION Left  05/20/2015   Procedure: A/V Shuntogram/Fistulagram;  Surgeon: Katha Cabal, MD;  Location: [facility] [facility];  Service: Cardiovascular;  Laterality: Left;  . PERIPHERAL VASCULAR CATHETERIZATION N/A 05/20/2015   Procedure: A/V Shunt Intervention;  Surgeon: Katha Cabal, MD;  Location: [place] [facility];  Service: Cardiovascular;  Laterality: N/A;  . PERIPHERAL VASCULAR CATHETERIZATION Left 08/05/2015   Procedure: A/V Shuntogram/Fistulagram;  Surgeon: Katha Cabal, MD;  Location: [place] [facility];  Service: Cardiovascular;  Laterality: Left;  . PERIPHERAL VASCULAR CATHETERIZATION N/A 08/05/2015   Procedure: A/V Shunt Intervention;  Surgeon: Katha Cabal, MD;  Location: [place] [facility];  Service: Cardiovascular;  Laterality: N/A;  . PERIPHERAL VASCULAR CATHETERIZATION Left 01/06/2016   Procedure: A/V Shuntogram/Fistulagram;  Surgeon: Katha Cabal, MD;  Location: [place] [facility];  Service: Cardiovascular;  Laterality: Left;  . PERIPHERAL VASCULAR CATHETERIZATION N/A 01/06/2016   Procedure: A/V Shunt Intervention;  Surgeon: Katha Cabal, MD;  Location: [place] [facility];  Service: Cardiovascular;  Laterality: N/A;    Past Surgical History:  Procedure Laterality Date  . CATARACT EXTRACTION W/PHACO Right 08/20/2015   Procedure: CATARACT EXTRACTION PHACO AND INTRAOCULAR LENS PLACEMENT ([place]);  Surgeon: Leandrew Koyanagi, MD;  Location: [facility];  Service: Ophthalmology;  Laterality: Right;  . CATARACT EXTRACTION W/PHACO Left 09/10/2015   Procedure: CATARACT EXTRACTION PHACO AND INTRAOCULAR LENS PLACEMENT ([facility]);  Surgeon: Leandrew Koyanagi, MD;  Location: [place];  Service: Ophthalmology;  Laterality: Left;  . CORONARY STENT PLACEMENT    . 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 PERIPHERAL VASCULAR CATHETERIZATION Left  05/20/2015   Procedure: A/V Shuntogram/Fistulagram;  Surgeon: Katha Cabal, MD;  Location: [place] [facility];  Service: Cardiovascular;  Laterality: Left;  . PERIPHERAL VASCULAR CATHETERIZATION N/A 05/20/2015   Procedure: A/V Shunt Intervention;  Surgeon: Katha Cabal, MD;  Location: [place] [facility];  Service: Cardiovascular;  Laterality: N/A;  . PERIPHERAL VASCULAR CATHETERIZATION Left 08/05/2015   Procedure: A/V Shuntogram/Fistulagram;  Surgeon: Katha Cabal, MD;  Location: [facility] [facility];  Service: Cardiovascular;  Laterality: Left;  . PERIPHERAL VASCULAR CATHETERIZATION N/A 08/05/2015   Procedure: A/V Shunt Intervention;  Surgeon: Katha Cabal, MD;  Location: [place] [facility];  Service: Cardiovascular;  Laterality: N/A;  . PERIPHERAL VASCULAR CATHETERIZATION Left 01/06/2016   Procedure: A/V Shuntogram/Fistulagram;  Surgeon: Katha Cabal, MD;  Location: [place] [facility];  Service: Cardiovascular;  Laterality: Left;  . PERIPHERAL VASCULAR CATHETERIZATION N/A 01/06/2016   Procedure: A/V Shunt Intervention;  Surgeon: Katha Cabal, MD;  Location: [place] [facility];  Service: Cardiovascular;  Laterality: N/A;    Current Outpatient Rx  . Order #: [id number] Class: Historical Med  . Order #: [id number] Class: Historical Med  . Order #: [id number] Class: Historical Med  . Order #: [id number] Class: Historical Med  . Order #: [id number] Class: Historical Med  . Order #: [id number] Class: Historical Med  . Order #: [id number] Class: Historical Med  . Order #: [id number] Class: Historical Med  . Order #: [id number] Class: Historical Med  . Order #: [id number] Class: Historical Med  . Order #: [id number] Class: Historical Med  . Order #: [id number] Class: Historical Med  . Order #: [id number] Class: Historical Med  . Order #: [id number] Class: Historical Med  . Order #: [id number] Class: Historical Med  . Order #: [id number] Class: Historical Med  . Order #: [id number] Class: Historical Med    Allergies:  Ace inhibitors; Amoxicillin-pot clavulanate; and Other  Family  History: No family history on file.  Social History: Social History  Substance Use Topics  . Smoking status: Former Smoker    Packs/day: 1.00    Types: Cigarettes    Quit date: 05/19/2009  . Smokeless tobacco: Never Used  . Alcohol use No     Review of Systems:   10 point review of systems was performed and was otherwise negative:  Constitutional: No fever Eyes: No visual disturbances ENT: No sore throat, ear pain Cardiac: No chest pain Respiratory: No shortness of breath, wheezing, or stridor Abdomen: No abdominal pain, no vomiting, No diarrhea Endocrine: No weight loss, No night sweats Extremities: No peripheral edema, cyanosis Skin: No rashes, easy bruising Neurologic: No focal weakness, trouble with speech or swollowing Urologic: No dysuria, Hematuria, or urinary frequency   Physical Exam:  ED Triage Vitals  Enc Vitals Group     BP 11/20/16 0628 110/67     Pulse Rate 11/20/16 0628 65     Resp 11/20/16 0628 18     Temp 11/20/16 0628 98.1 F (36.7 C)     Temp Source 11/20/16 0628 Oral     SpO2 11/20/16 0628 94 %     Weight 11/20/16 0626 247 lb (112 kg)     Height 11/20/16 0626 5\' 11"  (1.803 m)     Head Circumference --      Peak Flow --      Pain Score 11/20/16 0626 0     Pain Loc --      Pain Edu? --      Excl. in [place]? --     General: Awake , Alert , and Oriented  times 3; GCS 15 Head: Normal cephalic , atraumatic Eyes: Pupils equal , round, reactive to light Nose/Throat: No nasal drainage, patent upper airway without erythema or exudate.  Neck: Supple, Full range of motion, No anterior adenopathy or palpable thyroid masses Lungs: Clear to ascultation without wheezes , rhonchi, or rales Heart: Regular rate, regular rhythm without murmurs , gallops , or rubs Abdomen: Soft, non tender without rebound, guarding , or rigidity; bowel sounds positive and symmetric in all 4 quadrants. No organomegaly .        Extremities: Examination of his vascular site does not  show a palpable thrill at this point. Good distal radial pulse. Neurologic: normal ambulation, Motor symmetric without deficits, sensory intact Skin: warm, dry, no rashes   Labs:   All laboratory work was reviewed including any pertinent negatives or positives listed below:  Labs Reviewed  CBC WITH DIFFERENTIAL/PLATELET  BASIC METABOLIC PANEL  Patient's potassium was within normal limits. Renal function abnormalities as expected  Radiology:  "Dg Chest 2 View  Result Date: 11/20/2016 CLINICAL DATA:  Shortness of breath EXAM: CHEST  2 VIEW COMPARISON:  07/25/2014 chest radiograph. FINDINGS: Stable cardiomediastinal silhouette with normal heart size and mildly tortuous atherosclerotic thoracic aorta. No pneumothorax. No pleural effusion. Lungs appear clear, with no acute consolidative airspace disease and no pulmonary edema. IMPRESSION: No active cardiopulmonary disease. Electronically Signed   By: Ilona Sorrel M.D.   On: 11/20/2016 08:12  "  I personally reviewed the radiologic studies   ED Course: * Patient was seen and evaluated by nephrology to see if the patient required any immediate needs for his dialysis. Currently, the patient appears to be roughly at baseline and we felt that he would be fine to hold dialysis until Monday. Arrangements will be made through the dialysis center for assessment of his AV fistula site and arrangements will be made to reestablish flow whether it's a stent etc by vascular surgery Clinical Course      Assessment:  AV fistula complication History of renal failure     Plan:  Outpatient Patient was advised to return immediately if condition worsens. Patient was advised to follow up with their primary care physician or other specialized physicians involved in their outpatient care. The patient and/or family member/power of attorney had laboratory results reviewed at the bedside. All questions and concerns were addressed and appropriate discharge  instructions were distributed by the nursing staff.              Daymon Larsen, MD 11/20/16 1027

## 2016-11-20 NOTE — Consult Note (Signed)
Date: 11/20/2016                  Patient Name:  Maurice Peterson  MRN: [id number]  DOB: Sep 02, 1941  Age / Sex: 75 y.o., male         PCP: Madelyn Brunner, MD                 Service Requesting Consult: ER/Dr Marcelene Butte                 Reason for Consult: ESRD, clotted access, Evaluate for HD            History of Present Illness: Patient is a 74 y.o. male with medical problems of End-stage renal disease, on hemodialysis for 1-1/2 years, COPD, coronary disease with angioplasty 2012 and placement of drug-eluting stent in LAD, history of prostate cancer, hypertension, chronic systolic congestive heart failure, history of partial resection of right kidney who was admitted to [facility] on 11/20/2016 for evaluation of clotted dialysis access.  At present, declot services are not available due to weekend schedules. Dr. Marcelene Butte has discussed case with vascular surgery. Patient denies shortness of breath. No leg swelling. Appetite is good. No nausea or vomiting. Electrolyte results today show potassium of 4.8, bicarbonate of 28, BUN 57, hemoglobin 12.1. Patient states he makes good amount of urine.    Medications: Outpatient medications:  (Not in a hospital admission)  Current medications: No current facility-administered medications for this encounter.    Current Outpatient Prescriptions  Medication Sig Dispense Refill  . aspirin 81 MG tablet Take 81 mg by mouth daily.    [name] atorvastatin (LIPITOR) 10 MG tablet Take 10 mg by mouth daily.    . budesonide-formoterol (SYMBICORT) 160-4.5 MCG/ACT inhaler Inhale 2 puffs into the lungs 2 (two) times daily.    . calcium carbonate (TUMS - DOSED IN MG ELEMENTAL CALCIUM) 500 MG chewable tablet Chew 1 tablet by mouth 2 (two) times daily.    . Cholecalciferol (VITAMIN D3) 1000 UNITS CAPS Take 2,000 Units by mouth 2 (two) times daily.    . febuxostat (ULORIC) 40 MG tablet Take 40 mg by mouth daily. Sun, Mon, Wed, Fri    . losartan (COZAAR) 25 MG tablet Take 25  mg by mouth daily.    . montelukast (SINGULAIR) 10 MG tablet Take 10 mg by mouth at bedtime.    . multivitamin (RENA-VIT) TABS tablet Take 1 tablet by mouth daily.    . pantoprazole (PROTONIX) 40 MG tablet Take 40 mg by mouth daily.    [name] albuterol (PROVENTIL HFA;VENTOLIN HFA) 108 (90 BASE) MCG/ACT inhaler Inhale 2 puffs into the lungs every 4 (four) hours as needed for wheezing or shortness of breath.    . colchicine 0.6 MG tablet Take 0.6 mg by mouth as needed. Reported on 01/06/2016    . furosemide (LASIX) 20 MG tablet Take 20 mg by mouth. Tues, Thurs, Sat    . furosemide (LASIX) 40 MG tablet Take 40 mg by mouth.        Allergies: Allergies  Allergen Reactions  . Ace Inhibitors Nausea And Vomiting  . Amoxicillin-Pot Clavulanate Nausea And Vomiting  . Other Nausea And Vomiting    Anti-inflammatories      Past Medical History: Past Medical History:  Diagnosis Date  . Arthritis   . Chronic kidney disease    requires dialysis  . COPD (chronic obstructive pulmonary disease) ([place])   . GERD (gastroesophageal reflux disease)   . Hyperlipidemia   .  Hypertension   . Prostate cancer ([place])   . Shortness of breath dyspnea      Past Surgical History: Past Surgical History:  Procedure Laterality Date  . CATARACT EXTRACTION W/PHACO Right 08/20/2015   Procedure: CATARACT EXTRACTION PHACO AND INTRAOCULAR LENS PLACEMENT ([place]);  Surgeon: Leandrew Koyanagi, MD;  Location: [place];  Service: Ophthalmology;  Laterality: Right;  . CATARACT EXTRACTION W/PHACO Left 09/10/2015   Procedure: CATARACT EXTRACTION PHACO AND INTRAOCULAR LENS PLACEMENT ([facility]);  Surgeon: Leandrew Koyanagi, MD;  Location: [place];  Service: Ophthalmology;  Laterality: Left;  . CORONARY STENT PLACEMENT    . 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 PERIPHERAL VASCULAR CATHETERIZATION Left 05/20/2015   Procedure: A/V Shuntogram/Fistulagram;  Surgeon: Katha Cabal, MD;  Location: [place] [facility];  Service: Cardiovascular;  Laterality: Left;  . PERIPHERAL VASCULAR CATHETERIZATION N/A 05/20/2015   Procedure: A/V Shunt Intervention;  Surgeon: Katha Cabal, MD;  Location: [place] [facility];  Service: Cardiovascular;  Laterality: N/A;  . PERIPHERAL VASCULAR CATHETERIZATION Left 08/05/2015   Procedure: A/V Shuntogram/Fistulagram;  Surgeon: Katha Cabal, MD;  Location: [place] [facility];  Service: Cardiovascular;  Laterality: Left;  . PERIPHERAL VASCULAR CATHETERIZATION N/A 08/05/2015   Procedure: A/V Shunt Intervention;  Surgeon: Katha Cabal, MD;  Location: [place] [facility];  Service: Cardiovascular;  Laterality: N/A;  . PERIPHERAL VASCULAR CATHETERIZATION Left 01/06/2016   Procedure: A/V Shuntogram/Fistulagram;  Surgeon: Katha Cabal, MD;  Location: [place] [facility];  Service: Cardiovascular;  Laterality: Left;  . PERIPHERAL VASCULAR CATHETERIZATION N/A 01/06/2016   Procedure: A/V Shunt Intervention;  Surgeon: Katha Cabal, MD;  Location: [place] [facility];  Service: Cardiovascular;  Laterality: N/A;     Family History: No family history on file.   Social History: Social History   Social History  . Marital status: Married    Spouse name: N/A  . Number of children: N/A  . Years of education: N/A   Occupational History  . Not on file.   Social History Main Topics  . Smoking status: Former Smoker    Packs/day: 1.00    Types: Cigarettes    Quit date: 05/19/2009  . Smokeless tobacco: Never Used  . Alcohol use No  . Drug use: No  . Sexual activity: Yes   Other Topics Concern  . Not on file   Social History Narrative  . No narrative on file     Review of Systems: Gen: No fevers or chills HEENT: No vision or hearing issues CV: No shortness of breath or chest pain Resp: No cough or sputum  GI:no nausea or vomiting. Appetite is good GU : Makes good amount of urine. No dysuria or blood in urine MS: No complaints Derm:   No complaints Psych: No complaints  Heme: No complaints Neuro: No complaints Endocrine. No complaints  Vital Signs: Blood pressure 110/67, pulse 65, temperature 98.1 F (36.7 C), temperature source Oral, resp. rate 18, height 5\' 11"  (1.803 m), weight 112 kg (247 lb), SpO2 94 %.  No intake or output data in the 24 hours ending 11/20/16 0942  Weight trends: Filed Weights   11/20/16 [id number]  Weight: 112 kg (247 lb)    Physical Exam: General:  No acute distress, sitting up in bed   HEENT Anicteric, moist oral mucous membranes   Neck:  Supple   Lungs: Normal breathing effort, clear to auscultation   Heart::  Regular rhythm, no rub  or gallop   Abdomen: Soft, nontender, nondistended   Extremities:  No edema   Neurologic: Alert, oriented   Skin: No acute rashes   Access: Left forearm AV fistula clotted. No bruit or thrill           Lab results: Basic Metabolic Panel:  Recent Labs Lab 11/20/16 0800  NA 137  K 4.8  CL 99*  CO2 28  GLUCOSE 95  BUN 57*  CREATININE 7.70*  CALCIUM 7.9*    Liver Function Tests: No results for input(s): AST, ALT, ALKPHOS, BILITOT, PROT, ALBUMIN in the last 168 hours. No results for input(s): LIPASE, AMYLASE in the last 168 hours. No results for input(s): AMMONIA in the last 168 hours.  CBC:  Recent Labs Lab 11/20/16 0800  WBC 9.5  NEUTROABS 6.6*  HGB 12.1*  HCT 35.4*  MCV 91.6  PLT 195    Cardiac Enzymes: No results for input(s): CKTOTAL, TROPONINI in the last 168 hours.  BNP: Invalid input(s): POCBNP  CBG: No results for input(s): GLUCAP in the last 168 hours.  Microbiology: No results found for this or any previous visit (from the past 720 hour(s)).   Coagulation Studies: No results for input(s): LABPROT, INR in the last 72 hours.  Urinalysis: No results for input(s): COLORURINE, LABSPEC, PHURINE, GLUCOSEU, HGBUR, BILIRUBINUR, KETONESUR, PROTEINUR, UROBILINOGEN, NITRITE, LEUKOCYTESUR in the last 72 hours.  Invalid  input(s): APPERANCEUR      Imaging: Dg Chest 2 View  Result Date: 11/20/2016 CLINICAL DATA:  Shortness of breath EXAM: CHEST  2 VIEW COMPARISON:  07/25/2014 chest radiograph. FINDINGS: Stable cardiomediastinal silhouette with normal heart size and mildly tortuous atherosclerotic thoracic aorta. No pneumothorax. No pleural effusion. Lungs appear clear, with no acute consolidative airspace disease and no pulmonary edema. IMPRESSION: No active cardiopulmonary disease. Electronically Signed   By: Ilona Sorrel M.D.   On: 11/20/2016 08:12      Assessment & Plan: Pt is a 75 y.o. caucasian male with medical problems of End-stage renal disease, on hemodialysis for 1-1/2 years, COPD, coronary disease with angioplasty 2012 and placement of drug-eluting stent in LAD, history of prostate cancer, hypertension, chronic systolic congestive heart failure, history of partial resection of right kidney who was admitted to [facility] on 11/20/2016 for evaluation of clotted dialysis access. [facility].TTS-1.   1. ESRD with clotted AV access Electrolytes and volume status are acceptable No acute indication for dialysis at present.  No options for declot due to weekend schedules. This was presented to the patient. He states he is comfortable with going  Home and returning on Monday for declot. He was advised to avoid high potassium foods.  I have contacted nurse Malachy Mood at [facility]. They will arrange for his declot on Monday morning Patient was instructed to remain nothing by mouth starting Sunday night He was also instructed to take Lasix 80 mg by mouth twice a day until he is able to dialyze OK to discharge with vascular f/u on monday  D/w Dr Marcelene Butte

## 2016-11-25 ENCOUNTER — Encounter (INDEPENDENT_AMBULATORY_CARE_PROVIDER_SITE_OTHER): Payer: Self-pay | Admitting: Vascular Surgery

## 2016-11-25 ENCOUNTER — Ambulatory Visit (INDEPENDENT_AMBULATORY_CARE_PROVIDER_SITE_OTHER): Payer: Medicare Other | Admitting: Vascular Surgery

## 2016-11-25 VITALS — BP 123/77 | HR 86 | Resp 16 | Ht 71.0 in | Wt 240.0 lb

## 2016-11-25 DIAGNOSIS — I10 Essential (primary) hypertension: Secondary | ICD-10-CM | POA: Diagnosis not present

## 2016-11-25 DIAGNOSIS — T829XXS Unspecified complication of cardiac and vascular prosthetic device, implant and graft, sequela: Secondary | ICD-10-CM | POA: Diagnosis not present

## 2016-11-25 DIAGNOSIS — N186 End stage renal disease: Secondary | ICD-10-CM

## 2016-11-25 DIAGNOSIS — E7800 Pure hypercholesterolemia, unspecified: Secondary | ICD-10-CM

## 2016-11-28 NOTE — Progress Notes (Signed)
MRN : [id number]  Maurice Peterson is a 75 y.o. (April 02, 1941) male who presents with chief complaint of  Chief Complaint  Patient presents with  . Re-evaluation    Had a clotted access  .  History of Present Illness: The patient returns to the office for followup of their dialysis access. He is status post a successful thrombectomy at an outside institution. A catheter was also placed at that time.  The function of the access has been stable. The patient denies increased bleeding time or increased recirculation. Patient denies difficulty with cannulation. The patient denies hand pain or other symptoms consistent with steal phenomena.  No significant arm swelling.  The patient denies redness or swelling at the access site. The patient denies fever or chills at home or while on dialysis.  The patient denies amaurosis fugax or recent TIA symptoms. There are no recent neurological changes noted. The patient denies claudication symptoms or rest pain symptoms. The patient denies history of DVT, PE or superficial thrombophlebitis. The patient denies recent episodes of angina or shortness of breath.        Current Meds  Medication Sig  . albuterol (PROVENTIL HFA;VENTOLIN HFA) 108 (90 BASE) MCG/ACT inhaler Inhale 2 puffs into the lungs every 4 (four) hours as needed for wheezing or shortness of breath.  [name] aspirin 81 MG tablet Take 81 mg by mouth daily.  [name] atorvastatin (LIPITOR) 10 MG tablet Take 10 mg by mouth daily.  . budesonide-formoterol (SYMBICORT) 160-4.5 MCG/ACT inhaler Inhale 2 puffs into the lungs 2 (two) times daily.  . calcium carbonate (TUMS - DOSED IN MG ELEMENTAL CALCIUM) 500 MG chewable tablet Chew 1 tablet by mouth 2 (two) times daily.  . Cholecalciferol (VITAMIN D3) 1000 UNITS CAPS Take 2,000 Units by mouth 2 (two) times daily.  . colchicine 0.6 MG tablet Take 0.6 mg by mouth as needed. Reported on 01/06/2016  . febuxostat (ULORIC) 40 MG tablet Take 40 mg by mouth daily. Sun, Mon,  Wed, Fri  . furosemide (LASIX) 20 MG tablet Take 20 mg by mouth. Tues, Thurs, Sat  . furosemide (LASIX) 40 MG tablet Take 40 mg by mouth.  . losartan (COZAAR) 25 MG tablet Take 25 mg by mouth daily.  . montelukast (SINGULAIR) 10 MG tablet Take 10 mg by mouth at bedtime.  . multivitamin (RENA-VIT) TABS tablet Take 1 tablet by mouth daily.  . pantoprazole (PROTONIX) 40 MG tablet Take 40 mg by mouth daily.    Past Medical History:  Diagnosis Date  . Arthritis   . Chronic kidney disease    requires dialysis  . COPD (chronic obstructive pulmonary disease) ([place])   . GERD (gastroesophageal reflux disease)   . Hyperlipidemia   . Hypertension   . Prostate cancer ([place])   . Shortness of breath dyspnea     Past Surgical History:  Procedure Laterality Date  . CATARACT EXTRACTION W/PHACO Right 08/20/2015   Procedure: CATARACT EXTRACTION PHACO AND INTRAOCULAR LENS PLACEMENT ([place]);  Surgeon: Leandrew Koyanagi, MD;  Location: [facility];  Service: Ophthalmology;  Laterality: Right;  . CATARACT EXTRACTION W/PHACO Left 09/10/2015   Procedure: CATARACT EXTRACTION PHACO AND INTRAOCULAR LENS PLACEMENT ([facility]);  Surgeon: Leandrew Koyanagi, MD;  Location: [facility];  Service: Ophthalmology;  Laterality: Left;  . CORONARY STENT PLACEMENT    . 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 PERIPHERAL VASCULAR CATHETERIZATION Left 05/20/2015   Procedure: A/V Shuntogram/Fistulagram;  Surgeon: Katha Cabal, MD;  Location:  [facility];  Service: Cardiovascular;  Laterality: Left;  . PERIPHERAL VASCULAR CATHETERIZATION N/A 05/20/2015   Procedure: A/V Shunt Intervention;  Surgeon: Katha Cabal, MD;  Location: [facility];  Service: Cardiovascular;  Laterality: N/A;  . PERIPHERAL VASCULAR CATHETERIZATION Left 08/05/2015   Procedure: A/V Shuntogram/Fistulagram;  Surgeon: Katha Cabal, MD;  Location: [facility];  Service: Cardiovascular;  Laterality:  Left;  . PERIPHERAL VASCULAR CATHETERIZATION N/A 08/05/2015   Procedure: A/V Shunt Intervention;  Surgeon: Katha Cabal, MD;  Location: [facility];  Service: Cardiovascular;  Laterality: N/A;  . PERIPHERAL VASCULAR CATHETERIZATION Left 01/06/2016   Procedure: A/V Shuntogram/Fistulagram;  Surgeon: Katha Cabal, MD;  Location: [facility];  Service: Cardiovascular;  Laterality: Left;  . PERIPHERAL VASCULAR CATHETERIZATION N/A 01/06/2016   Procedure: A/V Shunt Intervention;  Surgeon: Katha Cabal, MD;  Location: [facility];  Service: Cardiovascular;  Laterality: N/A;    Social History Social History  Substance Use Topics  . Smoking status: Former Smoker    Packs/day: 1.00    Types: Cigarettes    Quit date: 05/19/2009  . Smokeless tobacco: Never Used  . Alcohol use No    Family History No family history on file. No family history of bleeding/clotting disorders, porphyria or autoimmune disease   Allergies  Allergen Reactions  . Ace Inhibitors Nausea And Vomiting  . Amoxicillin-Pot Clavulanate Nausea And Vomiting  . Other Nausea And Vomiting    Anti-inflammatories     REVIEW OF SYSTEMS (Negative unless checked)  Constitutional: [] Weight loss  [] Fever  [] Chills Cardiac: [] Chest pain   [] Chest pressure   [] Palpitations   [] Shortness of breath when laying flat   [] Shortness of breath with exertion. Vascular:  [] Pain in legs with walking   [] Pain in legs at rest  [] History of DVT   [] Phlebitis   [] Swelling in legs   [] Varicose veins   [] Non-healing ulcers Pulmonary:   [] Uses home oxygen   [] Productive cough   [] Hemoptysis   [] Wheeze  [] COPD   [] Asthma Neurologic:  [] Dizziness   [] Seizures   [] History of stroke   [] History of TIA  [] Aphasia   [] Vissual changes   [] Weakness or numbness in arm   [] Weakness or numbness in leg Musculoskeletal:   [] Joint swelling   [] Joint pain   [] Low back pain Hematologic:  [] Easy bruising  [] Easy bleeding    [] Hypercoagulable state   [] Anemic Gastrointestinal:  [] Diarrhea   [] Vomiting  [] Gastroesophageal reflux/heartburn   [] Difficulty swallowing. Genitourinary:  [] Chronic kidney disease   [] Difficult urination  [] Frequent urination   [] Blood in urine Skin:  [] Rashes   [] Ulcers  Psychological:  [] History of anxiety   []  History of major depression.  Physical Examination  Vitals:   11/25/16 1509  BP: 123/77  Pulse: 86  Resp: 16  Weight: 240 lb (108.9 kg)  Height: 5\' 11"  (1.803 m)   Body mass index is 33.47 kg/m. Gen: WD/WN, NAD Head: [place]/AT, No temporalis wasting.  Ear/Nose/Throat: Hearing grossly intact, nares w/o erythema or drainage, poor dentition Eyes: PER, EOMI, sclera nonicteric.  Neck: Supple, no masses.  No bruit or JVD.  Pulmonary:  Good air movement, clear to auscultation bilaterally, no use of accessory muscles.  Cardiac: RRR, normal S1, S2, no Murmurs. Vascular:  Left AV fistula with good thrill and good bruit Vessel Right Left  Radial Palpable Palpable  Ulnar Palpable Palpable  Brachial Palpable Palpable  Carotid Palpable Palpable  Femoral Palpable Palpable  Popliteal Palpable Palpable  PT Palpable Palpable  DP Palpable Palpable   Gastrointestinal: soft, non-distended. No guarding/no peritoneal signs.  Musculoskeletal: M/S 5/5 throughout.  No deformity or atrophy.  Neurologic: CN 2-12 intact. Pain and light touch intact in extremities.  Symmetrical.  Speech is fluent. Motor exam as listed above. Psychiatric: Judgment intact, Mood & affect appropriate for pt's clinical situation. Dermatologic: No rashes or ulcers noted.  No changes consistent with cellulitis. Lymph : No Cervical lymphadenopathy, no lichenification or skin changes of chronic lymphedema.  CBC Lab Results  Component Value Date   WBC 9.5 11/20/2016   HGB 12.1 (L) 11/20/2016   HCT 35.4 (L) 11/20/2016   MCV 91.6 11/20/2016   PLT 195 11/20/2016    BMET    Component Value Date/Time   NA 137  11/20/2016 0800   NA 141 12/17/2014 1034   K 4.8 11/20/2016 0800   K 4.5 12/17/2014 1034   CL 99 (L) 11/20/2016 0800   CL 108 (H) 12/17/2014 1034   CO2 28 11/20/2016 0800   CO2 26 12/17/2014 1034   GLUCOSE 95 11/20/2016 0800   GLUCOSE 98 12/17/2014 1034   BUN 57 (H) 11/20/2016 0800   BUN 45 (H) 12/17/2014 1034   CREATININE 7.70 (H) 11/20/2016 0800   CREATININE 4.30 (H) 12/17/2014 1034   CALCIUM 7.9 (L) 11/20/2016 0800   CALCIUM 7.7 (L) 12/17/2014 1034   GFRNONAA 6 (L) 11/20/2016 0800   GFRNONAA 15 (L) 12/17/2014 1034   GFRNONAA 15 (L) 07/25/2014 1040   GFRAA 7 (L) 11/20/2016 0800   GFRAA 18 (L) 12/17/2014 1034   GFRAA 17 (L) 07/25/2014 1040   Estimated Creatinine Clearance: 10.6 mL/min (by C-G formula based on SCr of 7.7 mg/dL (H)).  COAG No results found for: INR, PROTIME  Radiology Dg Chest 2 View  Result Date: 11/20/2016 CLINICAL DATA:  Shortness of breath EXAM: CHEST  2 VIEW COMPARISON:  07/25/2014 chest radiograph. FINDINGS: Stable cardiomediastinal silhouette with normal heart size and mildly tortuous atherosclerotic thoracic aorta. No pneumothorax. No pleural effusion. Lungs appear clear, with no acute consolidative airspace disease and no pulmonary edema. IMPRESSION: No active cardiopulmonary disease. Electronically Signed   By: Ilona Sorrel M.D.   On: 11/20/2016 08:12     Assessment/Plan 1. Complication of vascular access for dialysis, sequela Recommend:  The patient is doing well and currently has adequate dialysis access. The patient's dialysis center is not reporting any access issues. Flow pattern is stable when compared to the prior ultrasound.  The patient should have a duplex ultrasound of the dialysis access in 3 months. The patient will follow-up with me in the office after each ultrasound   - [place] Dialysis Access; Future - VAS US DUPLEX DIALYSIS ACCESS (AVF,AVG); Future  2. End stage renal disease ([place]) See #1  3. Essential hypertension,  benign Continue antihypertensive medications as already ordered and reviewed, no changes at this time.  4. Hypercholesterolemia Continue statin as ordered and reviewed, no changes at this time    Hortencia Pilar, MD  11/28/2016 8:53 PM

## 2016-12-09 ENCOUNTER — Other Ambulatory Visit (INDEPENDENT_AMBULATORY_CARE_PROVIDER_SITE_OTHER): Payer: Self-pay | Admitting: Vascular Surgery

## 2016-12-09 ENCOUNTER — Encounter (INDEPENDENT_AMBULATORY_CARE_PROVIDER_SITE_OTHER): Payer: Self-pay

## 2016-12-09 ENCOUNTER — Telehealth (INDEPENDENT_AMBULATORY_CARE_PROVIDER_SITE_OTHER): Payer: Self-pay | Admitting: Vascular Surgery

## 2016-12-09 NOTE — Telephone Encounter (Signed)
I spoke with the patient by phone his fistula has been working well and had no problems with accessing the left arm. We will make arrangements for his tunneled catheter to be removed this coming Monday, December 18

## 2016-12-13 ENCOUNTER — Encounter: Payer: Self-pay | Admitting: *Deleted

## 2016-12-13 ENCOUNTER — Ambulatory Visit
Admission: RE | Admit: 2016-12-13 | Discharge: 2016-12-13 | Disposition: A | Discharge status: Home / Self Care | Payer: Medicare Other | Source: Ambulatory Visit | Attending: Vascular Surgery | Admitting: Vascular Surgery

## 2016-12-13 ENCOUNTER — Encounter
Admission: RE | Disposition: A | Discharge status: Home / Self Care | Payer: Self-pay | Source: Ambulatory Visit | Attending: Vascular Surgery

## 2016-12-13 DIAGNOSIS — Z992 Dependence on renal dialysis: Secondary | ICD-10-CM | POA: Diagnosis not present

## 2016-12-13 DIAGNOSIS — I120 Hypertensive chronic kidney disease with stage 5 chronic kidney disease or end stage renal disease: Secondary | ICD-10-CM | POA: Diagnosis not present

## 2016-12-13 DIAGNOSIS — K219 Gastro-esophageal reflux disease without esophagitis: Secondary | ICD-10-CM | POA: Insufficient documentation

## 2016-12-13 DIAGNOSIS — Z881 Allergy status to other antibiotic agents status: Secondary | ICD-10-CM | POA: Diagnosis not present

## 2016-12-13 DIAGNOSIS — Z888 Allergy status to other drugs, medicaments and biological substances status: Secondary | ICD-10-CM | POA: Insufficient documentation

## 2016-12-13 DIAGNOSIS — N186 End stage renal disease: Secondary | ICD-10-CM | POA: Diagnosis not present

## 2016-12-13 DIAGNOSIS — E785 Hyperlipidemia, unspecified: Secondary | ICD-10-CM | POA: Diagnosis not present

## 2016-12-13 DIAGNOSIS — Z9842 Cataract extraction status, left eye: Secondary | ICD-10-CM | POA: Diagnosis not present

## 2016-12-13 DIAGNOSIS — Z452 Encounter for adjustment and management of vascular access device: Secondary | ICD-10-CM | POA: Insufficient documentation

## 2016-12-13 DIAGNOSIS — Z955 Presence of coronary angioplasty implant and graft: Secondary | ICD-10-CM | POA: Insufficient documentation

## 2016-12-13 DIAGNOSIS — M1990 Unspecified osteoarthritis, unspecified site: Secondary | ICD-10-CM | POA: Diagnosis not present

## 2016-12-13 DIAGNOSIS — Z9841 Cataract extraction status, right eye: Secondary | ICD-10-CM | POA: Diagnosis not present

## 2016-12-13 DIAGNOSIS — Z87891 Personal history of nicotine dependence: Secondary | ICD-10-CM | POA: Diagnosis not present

## 2016-12-13 HISTORY — PX: PERIPHERAL VASCULAR CATHETERIZATION: SHX172C

## 2016-12-13 SURGERY — DIALYSIS/PERMA CATHETER REMOVAL
Anesthesia: Moderate Sedation

## 2016-12-13 SURGICAL SUPPLY — 2 items
TRAY LACERAT/PLASTIC (MISCELLANEOUS) ×3 IMPLANT
TRAY SUT REMOVAL LITTAUER SCS (KITS) ×3 IMPLANT

## 2016-12-13 NOTE — H&P (Signed)
[facility] History & Physical Update  The patient was interviewed and re-examined.  The patient's previous History and Physical has been reviewed and is unchanged. His access is working and his permcath can be removed. There is no change in the plan of care. We plan to proceed with the scheduled procedure.  Leotis Pain, MD  12/13/2016, 10:08 AM

## 2016-12-13 NOTE — Op Note (Signed)
Operative Note     Preoperative diagnosis:   1. ESRD with functional permanent access  Postoperative diagnosis:  1. ESRD with functional permanent access  Procedure:  Removal of right jugular Permcath  Surgeon:  Leotis Pain, MD  Anesthesia:  Local  EBL:  Minimal  Indication for the Procedure:  The patient has a functional permanent dialysis access and no longer needs their permcath.  This can be removed.  Risks and benefits are discussed and informed consent is obtained.  Description of the Procedure:  The patient's right neck, chest and existing catheter were sterilely prepped and draped. The area around the catheter was anesthetized copiously with 1% lidocaine. The catheter was dissected out with curved hemostats until the cuff was freed from the surrounding fibrous sheath. The fiber sheath was transected, and the catheter was then removed in its entirety using gentle traction. Pressure was held and sterile dressings were placed. The patient tolerated the procedure well and was taken to the recovery room in stable condition.     Leotis Pain  12/13/2016, 11:40 AM This note was created with Dragon Medical transcription system. Any errors in dictation are purely unintentional.

## 2016-12-13 NOTE — Discharge Instructions (Signed)

## 2016-12-14 ENCOUNTER — Encounter: Payer: Self-pay | Admitting: Vascular Surgery

## 2017-01-03 ENCOUNTER — Ambulatory Visit (INDEPENDENT_AMBULATORY_CARE_PROVIDER_SITE_OTHER): Payer: Self-pay | Admitting: Vascular Surgery

## 2017-01-03 ENCOUNTER — Encounter (INDEPENDENT_AMBULATORY_CARE_PROVIDER_SITE_OTHER): Payer: Self-pay

## 2017-05-16 ENCOUNTER — Ambulatory Visit (INDEPENDENT_AMBULATORY_CARE_PROVIDER_SITE_OTHER): Payer: Medicare Other | Admitting: Vascular Surgery

## 2017-05-16 ENCOUNTER — Encounter (INDEPENDENT_AMBULATORY_CARE_PROVIDER_SITE_OTHER): Payer: Medicare Other

## 2017-05-24 ENCOUNTER — Ambulatory Visit (INDEPENDENT_AMBULATORY_CARE_PROVIDER_SITE_OTHER): Payer: Medicare Other | Admitting: Vascular Surgery

## 2017-05-24 ENCOUNTER — Ambulatory Visit (INDEPENDENT_AMBULATORY_CARE_PROVIDER_SITE_OTHER): Payer: Medicare Other

## 2017-05-24 ENCOUNTER — Encounter (INDEPENDENT_AMBULATORY_CARE_PROVIDER_SITE_OTHER): Payer: Self-pay | Admitting: Vascular Surgery

## 2017-05-24 VITALS — BP 149/83 | HR 103 | Resp 15 | Ht 71.0 in | Wt 247.0 lb

## 2017-05-24 DIAGNOSIS — E7800 Pure hypercholesterolemia, unspecified: Secondary | ICD-10-CM | POA: Diagnosis not present

## 2017-05-24 DIAGNOSIS — N186 End stage renal disease: Secondary | ICD-10-CM | POA: Diagnosis not present

## 2017-05-24 DIAGNOSIS — T829XXS Unspecified complication of cardiac and vascular prosthetic device, implant and graft, sequela: Secondary | ICD-10-CM

## 2017-05-24 DIAGNOSIS — I10 Essential (primary) hypertension: Secondary | ICD-10-CM | POA: Diagnosis not present

## 2017-05-24 NOTE — Progress Notes (Signed)
Subjective:    Patient ID: Maurice Peterson, male    DOB: [birth year], 76 y.o.   MRN: [id number] Chief Complaint  Patient presents with  . Routine Post Op    6 month HDA   Patient presents for a six month hemodialysis access follow up. The patient denies any issues with hemodialysis such as cannulation problems, increased bleeding, decrease in doppler flow or recirculation. The patient also denies any fistula skin breakdown, pain, edema, pallor or ulceration of the arm / hand.    Review of Systems  Constitutional: Negative.   HENT: Negative.   Eyes: Negative.   Respiratory: Negative.   Cardiovascular: Negative.   Gastrointestinal: Negative.   Endocrine: Negative.   Genitourinary: Negative.   Musculoskeletal: Negative.   Skin: Negative.   Allergic/Immunologic: Negative.   Neurological: Negative.   Hematological: Negative.   Psychiatric/Behavioral: Negative.       Objective:   Physical Exam  Constitutional: He is oriented to person, place, and time. He appears well-developed and well-nourished. No distress.  HENT:  Head: Normocephalic and atraumatic.  Eyes: Conjunctivae are normal. Pupils are equal, round, and reactive to light.  Neck: Normal range of motion.  Cardiovascular: Normal rate, regular rhythm and normal heart sounds.   Pulses:      Radial pulses are 2+ on the right side, and 2+ on the left side.  Left Upper Extremity Access: Good bruit and thrill. Skin intact.   Pulmonary/Chest: Effort normal.  Musculoskeletal: Normal range of motion. He exhibits no edema.  Neurological: He is alert and oriented to person, place, and time.  Skin: Skin is warm and dry. He is not diaphoretic.  Psychiatric: He has a normal mood and affect. His behavior is normal. Judgment and thought content normal.  Vitals reviewed.  BP (!) 149/83 (BP Location: Right Arm)   Pulse (!) 103   Resp 15   Ht 5\' 11"  (1.803 m)   Wt 247 lb (112 kg)   BMI 34.45 kg/m   Past Medical History:  Diagnosis  Date  . Arthritis   . Chronic kidney disease    requires dialysis  . COPD (chronic obstructive pulmonary disease) ([place])   . GERD (gastroesophageal reflux disease)   . Hyperlipidemia   . Hypertension   . Prostate cancer ([place])   . Shortness of breath dyspnea    Social History   Social History  . Marital status: Married    Spouse name: N/A  . Number of children: N/A  . Years of education: N/A   Occupational History  . Not on file.   Social History Main Topics  . Smoking status: Former Smoker    Packs/day: 1.00    Types: Cigarettes    Quit date: 05/19/2009  . Smokeless tobacco: Never Used  . Alcohol use No  . Drug use: No  . Sexual activity: Yes   Other Topics Concern  . Not on file   Social History Narrative  . No narrative on file   Past Surgical History:  Procedure Laterality Date  . CATARACT EXTRACTION W/PHACO Right 08/20/2015   Procedure: CATARACT EXTRACTION PHACO AND INTRAOCULAR LENS PLACEMENT ([place]);  Surgeon: Leandrew Koyanagi, MD;  Location: [facility];  Service: Ophthalmology;  Laterality: Right;  . CATARACT EXTRACTION W/PHACO Left 09/10/2015   Procedure: CATARACT EXTRACTION PHACO AND INTRAOCULAR LENS PLACEMENT ([facility]);  Surgeon: Leandrew Koyanagi, MD;  Location: [facility];  Service: Ophthalmology;  Laterality: Left;  . CORONARY STENT PLACEMENT    . KIDNEY SURGERY Right  tumor removed from kidney  . PERIPHERAL VASCULAR CATHETERIZATION Left 05/20/2015   Procedure: A/V Shuntogram/Fistulagram;  Surgeon: Katha Cabal, MD;  Location: [facility];  Service: Cardiovascular;  Laterality: Left;  . PERIPHERAL VASCULAR CATHETERIZATION N/A 05/20/2015   Procedure: A/V Shunt Intervention;  Surgeon: Katha Cabal, MD;  Location: [facility];  Service: Cardiovascular;  Laterality: N/A;  . PERIPHERAL VASCULAR CATHETERIZATION Left 08/05/2015   Procedure: A/V Shuntogram/Fistulagram;  Surgeon: Katha Cabal, MD;  Location: [facility];  Service: Cardiovascular;  Laterality: Left;  . PERIPHERAL VASCULAR CATHETERIZATION N/A 08/05/2015   Procedure: A/V Shunt Intervention;  Surgeon: Katha Cabal, MD;  Location: [facility];  Service: Cardiovascular;  Laterality: N/A;  . PERIPHERAL VASCULAR CATHETERIZATION Left 01/06/2016   Procedure: A/V Shuntogram/Fistulagram;  Surgeon: Katha Cabal, MD;  Location: [facility];  Service: Cardiovascular;  Laterality: Left;  . PERIPHERAL VASCULAR CATHETERIZATION N/A 01/06/2016   Procedure: A/V Shunt Intervention;  Surgeon: Katha Cabal, MD;  Location: [facility];  Service: Cardiovascular;  Laterality: N/A;  . PERIPHERAL VASCULAR CATHETERIZATION N/A 12/13/2016   Procedure: Dialysis/Perma Catheter Removal;  Surgeon: Algernon Huxley, MD;  Location: [facility];  Service: Cardiovascular;  Laterality: N/A;   No family history on file.  Allergies  Allergen Reactions  . Ace Inhibitors Nausea And Vomiting  . Amoxicillin-Pot Clavulanate Nausea And Vomiting  . Other Nausea And Vomiting    Anti-inflammatories      Assessment & Plan:  Patient presents for a six month hemodialysis access follow up. The patient denies any issues with hemodialysis such as cannulation problems, increased bleeding, decrease in doppler flow or recirculation. The patient also denies any fistula skin breakdown, pain, edema, pallor or ulceration of the arm / hand.   1. End stage renal disease ([place]) - Stable Studies reviewed with patient. The patient is doing well and currently has adequate dialysis access. Duplex ultrasound of the AV access shows a patent access with no evidence of hemodynamically significant strictures or stenosis.  The patient should continue to have duplex ultrasounds of the dialysis access every Six months. The patient was instructed to call the office in the interim if any issues with dialysis access / doppler flow, pain, edema, pallor, fistula  skin breakdown or ulceration of the arm / hand occur. The patient expressed their understanding.  - VAS US DUPLEX DIALYSIS ACCESS (AVF,AVG); Future  2. Hypercholesterolemia - Stable Encouraged good control as its slows the progression of atherosclerotic disease  3. Essential hypertension, benign - Stable Encouraged good control as its slows the progression of atherosclerotic disease  Current Outpatient Prescriptions on File Prior to Visit  Medication Sig Dispense Refill  . albuterol (PROVENTIL HFA;VENTOLIN HFA) 108 (90 BASE) MCG/ACT inhaler Inhale 2 puffs into the lungs every 4 (four) hours as needed for wheezing or shortness of breath.    [name] aspirin 81 MG tablet Take 81 mg by mouth daily.    [name] atorvastatin (LIPITOR) 10 MG tablet Take 10 mg by mouth daily at 6 PM.     . budesonide-formoterol (SYMBICORT) 160-4.5 MCG/ACT inhaler Inhale 2 puffs into the lungs 2 (two) times daily.    . calcium acetate (PHOSLO) 667 MG capsule Take 1,334 mg by mouth 3 (three) times daily with meals.    . calcium carbonate (TUMS - DOSED IN MG ELEMENTAL CALCIUM) 500 MG chewable tablet Chew 1 tablet by mouth 2 (two) times daily.    [name]  Cholecalciferol (VITAMIN D3) 1000 UNITS CAPS Take 2,000 Units by mouth daily.     . colchicine 0.6 MG tablet Take 0.6 mg by mouth daily as needed. Reported on 01/06/2016    . DiphenhydrAMINE HCl, Sleep, (ZZZQUIL PO) Take 25 mg by mouth at bedtime as needed.    . febuxostat (ULORIC) 40 MG tablet Take 40 mg by mouth daily.     . furosemide (LASIX) 20 MG tablet Take 20 mg by mouth. Tues, Thurs, Sat    . furosemide (LASIX) 40 MG tablet Take 40 mg by mouth.    . losartan (COZAAR) 25 MG tablet Take 25 mg by mouth daily.    . metoprolol succinate (TOPROL-XL) 50 MG 24 hr tablet Take 50 mg by mouth daily. Take with or immediately following a meal.    . montelukast (SINGULAIR) 10 MG tablet Take 10 mg by mouth at bedtime.    . multivitamin (RENA-VIT) TABS tablet Take 1 tablet by mouth daily.    .  pantoprazole (PROTONIX) 40 MG tablet Take 40 mg by mouth daily.     No current facility-administered medications on file prior to visit.     There are no Patient Instructions on file for this visit. No Follow-up on file.   Margarite Vessel A Giovani Neumeister, PA-C

## 2017-06-17 ENCOUNTER — Encounter: Payer: Self-pay | Admitting: Emergency Medicine

## 2017-06-17 ENCOUNTER — Inpatient Hospital Stay
Admission: EM | Admit: 2017-06-17 | Discharge: 2017-06-19 | DRG: 308 | Disposition: A | Discharge status: Home / Self Care | Payer: Medicare Other | Attending: Specialist | Admitting: Specialist

## 2017-06-17 ENCOUNTER — Emergency Department: Payer: Medicare Other

## 2017-06-17 DIAGNOSIS — Z6834 Body mass index (BMI) 34.0-34.9, adult: Secondary | ICD-10-CM

## 2017-06-17 DIAGNOSIS — N2581 Secondary hyperparathyroidism of renal origin: Secondary | ICD-10-CM | POA: Diagnosis present

## 2017-06-17 DIAGNOSIS — Z9841 Cataract extraction status, right eye: Secondary | ICD-10-CM | POA: Diagnosis not present

## 2017-06-17 DIAGNOSIS — I4892 Unspecified atrial flutter: Principal | ICD-10-CM | POA: Diagnosis present

## 2017-06-17 DIAGNOSIS — Z8546 Personal history of malignant neoplasm of prostate: Secondary | ICD-10-CM | POA: Diagnosis not present

## 2017-06-17 DIAGNOSIS — Z79899 Other long term (current) drug therapy: Secondary | ICD-10-CM | POA: Diagnosis not present

## 2017-06-17 DIAGNOSIS — E785 Hyperlipidemia, unspecified: Secondary | ICD-10-CM | POA: Diagnosis present

## 2017-06-17 DIAGNOSIS — Z955 Presence of coronary angioplasty implant and graft: Secondary | ICD-10-CM | POA: Diagnosis not present

## 2017-06-17 DIAGNOSIS — I4891 Unspecified atrial fibrillation: Secondary | ICD-10-CM | POA: Diagnosis present

## 2017-06-17 DIAGNOSIS — Z7982 Long term (current) use of aspirin: Secondary | ICD-10-CM | POA: Diagnosis not present

## 2017-06-17 DIAGNOSIS — M1990 Unspecified osteoarthritis, unspecified site: Secondary | ICD-10-CM | POA: Diagnosis present

## 2017-06-17 DIAGNOSIS — Z87891 Personal history of nicotine dependence: Secondary | ICD-10-CM

## 2017-06-17 DIAGNOSIS — N186 End stage renal disease: Secondary | ICD-10-CM | POA: Diagnosis present

## 2017-06-17 DIAGNOSIS — J449 Chronic obstructive pulmonary disease, unspecified: Secondary | ICD-10-CM | POA: Diagnosis present

## 2017-06-17 DIAGNOSIS — K219 Gastro-esophageal reflux disease without esophagitis: Secondary | ICD-10-CM | POA: Diagnosis present

## 2017-06-17 DIAGNOSIS — Z992 Dependence on renal dialysis: Secondary | ICD-10-CM

## 2017-06-17 DIAGNOSIS — I120 Hypertensive chronic kidney disease with stage 5 chronic kidney disease or end stage renal disease: Secondary | ICD-10-CM | POA: Diagnosis present

## 2017-06-17 DIAGNOSIS — R000 Tachycardia, unspecified: Secondary | ICD-10-CM

## 2017-06-17 DIAGNOSIS — I248 Other forms of acute ischemic heart disease: Secondary | ICD-10-CM | POA: Diagnosis present

## 2017-06-17 DIAGNOSIS — M109 Gout, unspecified: Secondary | ICD-10-CM | POA: Diagnosis present

## 2017-06-17 DIAGNOSIS — E669 Obesity, unspecified: Secondary | ICD-10-CM | POA: Diagnosis present

## 2017-06-17 DIAGNOSIS — Z961 Presence of intraocular lens: Secondary | ICD-10-CM | POA: Diagnosis present

## 2017-06-17 DIAGNOSIS — I471 Supraventricular tachycardia: Secondary | ICD-10-CM | POA: Diagnosis present

## 2017-06-17 DIAGNOSIS — Z9842 Cataract extraction status, left eye: Secondary | ICD-10-CM | POA: Diagnosis not present

## 2017-06-17 LAB — BASIC METABOLIC PANEL
Anion gap: 11 (ref 5–15)
BUN: 20 mg/dL (ref 6–20)
CALCIUM: 8.3 mg/dL — AB (ref 8.9–10.3)
CO2: 31 mmol/L (ref 22–32)
CREATININE: 3.72 mg/dL — AB (ref 0.61–1.24)
Chloride: 94 mmol/L — ABNORMAL LOW (ref 101–111)
GFR calc Af Amer: 17 mL/min — ABNORMAL LOW (ref >60)
GFR, EST NON AFRICAN AMERICAN: 15 mL/min — AB (ref >60)
GLUCOSE: 113 mg/dL — AB (ref 65–99)
Potassium: 3.7 mmol/L (ref 3.5–5.1)
Sodium: 136 mmol/L (ref 135–145)

## 2017-06-17 LAB — APTT: APTT: 34 s (ref 24–36)

## 2017-06-17 LAB — CBC
HCT: 37.6 % — ABNORMAL LOW (ref 40.0–52.0)
Hemoglobin: 12.6 g/dL — ABNORMAL LOW (ref 13.0–18.0)
MCH: 31.4 pg (ref 26.0–34.0)
MCHC: 33.5 g/dL (ref 32.0–36.0)
MCV: 93.7 fL (ref 80.0–100.0)
Platelets: 209 10*3/uL (ref 150–440)
RBC: 4.02 MIL/uL — ABNORMAL LOW (ref 4.40–5.90)
RDW: 14.4 % (ref 11.5–14.5)
WBC: 13.4 10*3/uL — ABNORMAL HIGH (ref 3.8–10.6)

## 2017-06-17 LAB — PROTIME-INR
INR: 1.06
Prothrombin Time: 13.8 seconds (ref 11.4–15.2)

## 2017-06-17 LAB — TROPONIN I
TROPONIN I: 0.05 ng/mL — AB (ref <0.03)
Troponin I: 0.06 ng/mL (ref <0.03)

## 2017-06-17 LAB — MRSA PCR SCREENING: MRSA by PCR: NEGATIVE

## 2017-06-17 MED ORDER — MONTELUKAST SODIUM 10 MG PO TABS
10.0000 mg | ORAL_TABLET | Freq: Every day | ORAL | Status: DC
  Administered 2017-06-18: 10 mg via ORAL
  Filled 2017-06-17: qty 1

## 2017-06-17 MED ORDER — ADENOSINE 12 MG/4ML IV SOLN
12.0000 mg | Freq: Once | INTRAVENOUS | Status: AC
  Administered 2017-06-17: 12 mg via INTRAVENOUS

## 2017-06-17 MED ORDER — ACETAMINOPHEN 325 MG PO TABS: 650.0000 mg | ORAL_TABLET | Freq: Four times a day (QID) | ORAL | Status: DC | PRN

## 2017-06-17 MED ORDER — ONDANSETRON HCL 4 MG PO TABS: 4.0000 mg | ORAL_TABLET | Freq: Four times a day (QID) | ORAL | Status: DC | PRN

## 2017-06-17 MED ORDER — ADENOSINE 6 MG/2ML IV SOLN
6.0000 mg | Freq: Once | INTRAVENOUS | Status: AC
  Administered 2017-06-17: 6 mg via INTRAVENOUS

## 2017-06-17 MED ORDER — FEBUXOSTAT 40 MG PO TABS
40.0000 mg | ORAL_TABLET | Freq: Every day | ORAL | Status: DC
  Administered 2017-06-17 – 2017-06-18 (×2): 40 mg via ORAL
  Filled 2017-06-17 (×4): qty 1

## 2017-06-17 MED ORDER — DIPHENHYDRAMINE HCL 25 MG PO CAPS
25.0000 mg | ORAL_CAPSULE | Freq: Every evening | ORAL | Status: DC | PRN
  Administered 2017-06-17 – 2017-06-18 (×2): 25 mg via ORAL
  Filled 2017-06-17 (×2): qty 1

## 2017-06-17 MED ORDER — ONDANSETRON HCL 4 MG/2ML IJ SOLN: 4.0000 mg | Freq: Four times a day (QID) | INTRAMUSCULAR | Status: DC | PRN

## 2017-06-17 MED ORDER — MOMETASONE FURO-FORMOTEROL FUM 200-5 MCG/ACT IN AERO
2.0000 | INHALATION_SPRAY | Freq: Two times a day (BID) | RESPIRATORY_TRACT | Status: DC
  Administered 2017-06-18 – 2017-06-19 (×2): 2 via RESPIRATORY_TRACT
  Filled 2017-06-17: qty 8.8

## 2017-06-17 MED ORDER — ACETAMINOPHEN 650 MG RE SUPP: 650.0000 mg | Freq: Four times a day (QID) | RECTAL | Status: DC | PRN

## 2017-06-17 MED ORDER — SODIUM CHLORIDE 0.9 % IV BOLUS (SEPSIS)
250.0000 mL | Freq: Once | INTRAVENOUS | Status: AC
  Administered 2017-06-17: 250 mL via INTRAVENOUS

## 2017-06-17 MED ORDER — AMIODARONE LOAD VIA INFUSION
150.0000 mg | Freq: Once | INTRAVENOUS | Status: AC
  Administered 2017-06-17: 150 mg via INTRAVENOUS
  Filled 2017-06-17: qty 83.34

## 2017-06-17 MED ORDER — ADENOSINE 6 MG/2ML IV SOLN
INTRAVENOUS | Status: AC
  Administered 2017-06-17: 6 mg via INTRAVENOUS
  Filled 2017-06-17: qty 2

## 2017-06-17 MED ORDER — ASPIRIN EC 81 MG PO TBEC
81.0000 mg | DELAYED_RELEASE_TABLET | Freq: Every day | ORAL | Status: DC
  Administered 2017-06-18 – 2017-06-19 (×2): 81 mg via ORAL
  Filled 2017-06-17 (×2): qty 1

## 2017-06-17 MED ORDER — PANTOPRAZOLE SODIUM 40 MG PO TBEC
40.0000 mg | DELAYED_RELEASE_TABLET | Freq: Every day | ORAL | Status: DC
  Administered 2017-06-18 – 2017-06-19 (×2): 40 mg via ORAL
  Filled 2017-06-17 (×2): qty 1

## 2017-06-17 MED ORDER — FUROSEMIDE 40 MG PO TABS
80.0000 mg | ORAL_TABLET | Freq: Every day | ORAL | Status: DC
Start: 2017-06-18 — End: 2017-06-19
  Administered 2017-06-18 – 2017-06-19 (×2): 80 mg via ORAL
  Filled 2017-06-17 (×2): qty 2

## 2017-06-17 MED ORDER — ALBUTEROL SULFATE (2.5 MG/3ML) 0.083% IN NEBU
2.5000 mg | INHALATION_SOLUTION | RESPIRATORY_TRACT | Status: DC | PRN
  Administered 2017-06-18 – 2017-06-19 (×2): 2.5 mg via RESPIRATORY_TRACT
  Filled 2017-06-17 (×2): qty 3

## 2017-06-17 MED ORDER — CALCIUM ACETATE 667 MG PO CAPS
1334.0000 mg | ORAL_CAPSULE | Freq: Three times a day (TID) | ORAL | Status: DC
Start: 2017-06-18 — End: 2017-06-19
  Administered 2017-06-18 – 2017-06-19 (×4): 1334 mg via ORAL
  Filled 2017-06-17 (×10): qty 2

## 2017-06-17 MED ORDER — ATORVASTATIN CALCIUM 10 MG PO TABS
10.0000 mg | ORAL_TABLET | Freq: Every day | ORAL | Status: DC
  Administered 2017-06-17 – 2017-06-18 (×2): 10 mg via ORAL
  Filled 2017-06-17 (×2): qty 1

## 2017-06-17 MED ORDER — HEPARIN BOLUS VIA INFUSION
5000.0000 [IU] | Freq: Once | INTRAVENOUS | Status: AC
  Administered 2017-06-17: 5000 [IU] via INTRAVENOUS
  Filled 2017-06-17: qty 5000

## 2017-06-17 MED ORDER — VITAMIN D 1000 UNITS PO TABS
2000.0000 [IU] | ORAL_TABLET | Freq: Every day | ORAL | Status: DC
  Administered 2017-06-18 – 2017-06-19 (×2): 2000 [IU] via ORAL
  Filled 2017-06-17 (×2): qty 2

## 2017-06-17 MED ORDER — HEPARIN (PORCINE) IN NACL 100-0.45 UNIT/ML-% IJ SOLN
1600.0000 [IU]/h | INTRAMUSCULAR | Status: DC
  Administered 2017-06-17: 1500 [IU]/h via INTRAVENOUS
  Filled 2017-06-17 (×4): qty 250

## 2017-06-17 MED ORDER — RENA-VITE PO TABS
1.0000 | ORAL_TABLET | Freq: Every day | ORAL | Status: DC
  Administered 2017-06-18 – 2017-06-19 (×2): 1 via ORAL
  Filled 2017-06-17 (×3): qty 1

## 2017-06-17 MED ORDER — AMIODARONE HCL IN DEXTROSE 360-4.14 MG/200ML-% IV SOLN
30.0000 mg/h | INTRAVENOUS | Status: DC
  Administered 2017-06-17: 30 mg/h via INTRAVENOUS
  Filled 2017-06-17: qty 200

## 2017-06-17 MED ORDER — DILTIAZEM HCL 30 MG PO TABS
30.0000 mg | ORAL_TABLET | Freq: Four times a day (QID) | ORAL | Status: DC
  Administered 2017-06-17 – 2017-06-18 (×2): 30 mg via ORAL
  Filled 2017-06-17 (×4): qty 1

## 2017-06-17 MED ORDER — ADENOSINE 12 MG/4ML IV SOLN
INTRAVENOUS | Status: AC
  Administered 2017-06-17: 12 mg via INTRAVENOUS
  Filled 2017-06-17: qty 4

## 2017-06-17 MED ORDER — LOSARTAN POTASSIUM 25 MG PO TABS
25.0000 mg | ORAL_TABLET | Freq: Every day | ORAL | Status: DC
  Administered 2017-06-18 – 2017-06-19 (×2): 25 mg via ORAL
  Filled 2017-06-17 (×2): qty 1

## 2017-06-17 MED ORDER — AMIODARONE HCL IN DEXTROSE 360-4.14 MG/200ML-% IV SOLN
60.0000 mg/h | INTRAVENOUS | Status: AC
  Administered 2017-06-17 (×2): 60 mg/h via INTRAVENOUS
  Filled 2017-06-17 (×2): qty 200

## 2017-06-17 NOTE — Progress Notes (Signed)
ANTICOAGULATION CONSULT NOTE - Initial Consult  Pharmacy Consult for Heparin  Indication: atrial fibrillation  Allergies  Allergen Reactions  . Ace Inhibitors Nausea And Vomiting  . Amoxicillin-Pot Clavulanate Nausea And Vomiting  . Other Nausea And Vomiting    Anti-inflammatories    Patient Measurements: Height: 5\' 11"  (180.3 cm) Weight: 245 lb (111.1 kg) IBW/kg (Calculated) : 75.3 Heparin Dosing Weight: 99.2 kg   Vital Signs: Temp: 98.4 F (36.9 C) (06/22 1418) Temp Source: Oral (06/22 1418) BP: 111/83 (06/22 1740) Pulse Rate: 139 (06/22 1707)  Labs:  Recent Labs  06/17/17 1430  HGB 12.6*  HCT 37.6*  PLT 209  CREATININE 3.72*  TROPONINI 0.05*    Estimated Creatinine Clearance: 21.7 mL/min (A) (by C-G formula based on SCr of 3.72 mg/dL (H)).   Medical History: Past Medical History:  Diagnosis Date  . Arthritis   . Chronic kidney disease    requires dialysis  . COPD (chronic obstructive pulmonary disease) ([place])   . GERD (gastroesophageal reflux disease)   . Hyperlipidemia   . Hypertension   . Prostate cancer ([place])   . Shortness of breath dyspnea     Medications:   (Not in a hospital admission)  Assessment: Pharmacy consulted to dose heparin in this 76 year old male admitted with A fib.  No prior anticoag noted. CrCl = 21.7 ml/min  Goal of Therapy:  Heparin level 0.3-0.7 units/ml Monitor platelets by anticoagulation protocol: Yes   Plan:  Give 5000 units bolus x 1 Start heparin infusion at 1500 units/hr Check anti-Xa level in 8 hours and daily while on heparin Continue to monitor H&H and platelets  Markeria Goetsch D 06/17/2017,5:57 PM

## 2017-06-17 NOTE — H&P (Signed)
[facility] [place] NAME: Maurice Peterson    MR#:  [id number]  DATE OF BIRTH:  1941/06/08  DATE OF ADMISSION:  06/17/2017  PRIMARY CARE PHYSICIAN: Madelyn Brunner, MD   REQUESTING/REFERRING PHYSICIAN: Dr. Larae Grooms  CHIEF COMPLAINT:   Chief Complaint  Patient presents with  . Tachycardia    HISTORY OF PRESENT ILLNESS:  Maurice Peterson  is a 76 y.o. male with a known history of End-stage renal disease on hemodialysis, COPD, GERD, hypertension, hyperlipidemia, history of prostate cancer who presents to the hospital due to tachycardia. Patient went to dialysis today and was noted to be significantly tachycardic with heart rates in the 140s. He finished his hemodialysis as he was asymptomatic. He can need to have persistent tachycardia even after dialysis and therefore was sent to the ER for further evaluation. Patient denies any chest pains, shortness of breath, nausea, vomiting, abdominal pain, fever or chills palpitations syncope or any other associated symptoms. Given his persistent tachycardia and relative hypotension hospitalist services were contacted further treatment and evaluation.  PAST MEDICAL HISTORY:   Past Medical History:  Diagnosis Date  . Arthritis   . Chronic kidney disease    requires dialysis  . COPD (chronic obstructive pulmonary disease) ([place])   . GERD (gastroesophageal reflux disease)   . Hyperlipidemia   . Hypertension   . Prostate cancer ([place])   . Shortness of breath dyspnea     PAST SURGICAL HISTORY:   Past Surgical History:  Procedure Laterality Date  . CATARACT EXTRACTION W/PHACO Right 08/20/2015   Procedure: CATARACT EXTRACTION PHACO AND INTRAOCULAR LENS PLACEMENT ([place]);  Surgeon: Leandrew Koyanagi, MD;  Location: [facility];  Service: Ophthalmology;  Laterality: Right;  . CATARACT EXTRACTION W/PHACO Left 09/10/2015   Procedure: CATARACT EXTRACTION PHACO AND INTRAOCULAR LENS PLACEMENT ([facility]);  Surgeon:  Leandrew Koyanagi, MD;  Location: [facility];  Service: Ophthalmology;  Laterality: Left;  . CORONARY STENT PLACEMENT    . 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 PERIPHERAL VASCULAR CATHETERIZATION Left 05/20/2015   Procedure: A/V Shuntogram/Fistulagram;  Surgeon: Katha Cabal, MD;  Location: [facility];  Service: Cardiovascular;  Laterality: Left;  . PERIPHERAL VASCULAR CATHETERIZATION N/A 05/20/2015   Procedure: A/V Shunt Intervention;  Surgeon: Katha Cabal, MD;  Location: [facility];  Service: Cardiovascular;  Laterality: N/A;  . PERIPHERAL VASCULAR CATHETERIZATION Left 08/05/2015   Procedure: A/V Shuntogram/Fistulagram;  Surgeon: Katha Cabal, MD;  Location: [facility];  Service: Cardiovascular;  Laterality: Left;  . PERIPHERAL VASCULAR CATHETERIZATION N/A 08/05/2015   Procedure: A/V Shunt Intervention;  Surgeon: Katha Cabal, MD;  Location: [facility];  Service: Cardiovascular;  Laterality: N/A;  . PERIPHERAL VASCULAR CATHETERIZATION Left 01/06/2016   Procedure: A/V Shuntogram/Fistulagram;  Surgeon: Katha Cabal, MD;  Location: [facility];  Service: Cardiovascular;  Laterality: Left;  . PERIPHERAL VASCULAR CATHETERIZATION N/A 01/06/2016   Procedure: A/V Shunt Intervention;  Surgeon: Katha Cabal, MD;  Location: [place] [facility];  Service: Cardiovascular;  Laterality: N/A;  . PERIPHERAL VASCULAR CATHETERIZATION N/A 12/13/2016   Procedure: Dialysis/Perma Catheter Removal;  Surgeon: Algernon Huxley, MD;  Location: [place] [facility];  Service: Cardiovascular;  Laterality: N/A;    SOCIAL HISTORY:   Social History  Substance Use Topics  . Smoking status: Former Smoker    Packs/day: 1.00    Years: 30.00    Types:  Cigarettes    Quit date: 05/19/2009  . Smokeless tobacco: Never Used  . Alcohol use No    FAMILY HISTORY:   Family History  Problem Relation Age of Onset  . Liver disease  Mother   . Heart disease Mother   . Hypertension Mother   . Pancreatic cancer Father     DRUG ALLERGIES:   Allergies  Allergen Reactions  . Ace Inhibitors Nausea And Vomiting  . Amoxicillin-Pot Clavulanate Nausea And Vomiting  . Other Nausea And Vomiting    Anti-inflammatories    REVIEW OF SYSTEMS:   Review of Systems  Constitutional: Negative for fever and weight loss.  HENT: Negative for congestion, nosebleeds and tinnitus.   Eyes: Negative for blurred vision, double vision and redness.  Respiratory: Negative for cough, hemoptysis and shortness of breath.   Cardiovascular: Negative for chest pain, orthopnea, leg swelling and PND.  Gastrointestinal: Negative for abdominal pain, diarrhea, melena, nausea and vomiting.  Genitourinary: Negative for dysuria, hematuria and urgency.  Musculoskeletal: Negative for falls and joint pain.  Neurological: Negative for dizziness, tingling, sensory change, focal weakness, seizures, weakness and headaches.  Endo/Heme/Allergies: Negative for polydipsia. Does not bruise/bleed easily.  Psychiatric/Behavioral: Negative for depression and memory loss. The patient is not nervous/anxious.     MEDICATIONS AT HOME:   Prior to Admission medications   Medication Sig Start Date End Date Taking? Authorizing Provider  albuterol (PROVENTIL HFA;VENTOLIN HFA) 108 (90 BASE) MCG/ACT inhaler Inhale 2 puffs into the lungs every 4 (four) hours as needed for wheezing or shortness of breath.   Yes [provider]  aspirin 81 MG tablet Take 81 mg by mouth daily.   Yes [provider]  atorvastatin (LIPITOR) 10 MG tablet Take 10 mg by mouth daily at 6 PM.    Yes [provider]  budesonide-formoterol (SYMBICORT) 160-4.5 MCG/ACT inhaler Inhale 2 puffs into the lungs 2 (two) times daily.   Yes [provider]  calcium acetate (PHOSLO) 667 MG capsule Take 1,334 mg by mouth 3 (three) times daily with meals.   Yes [provider]  Cholecalciferol (VITAMIN D3) 1000 UNITS CAPS Take 2,000 Units by mouth daily.    Yes [provider]  colchicine 0.6 MG tablet Take 0.6 mg by mouth daily as needed. Reported on 01/06/2016   Yes [provider]  DiphenhydrAMINE HCl, Sleep, (ZZZQUIL PO) Take 25 mg by mouth at bedtime as needed.   Yes [provider]  febuxostat (ULORIC) 40 MG tablet Take 40 mg by mouth daily.    Yes [provider]  furosemide (LASIX) 20 MG tablet Take 80 mg by mouth daily.    Yes [provider]  losartan (COZAAR) 25 MG tablet Take 25 mg by mouth daily.   Yes [provider]  metoprolol succinate (TOPROL-XL) 50 MG 24 hr tablet Take 50 mg by mouth daily. Take with or immediately following a meal.   Yes [provider]  montelukast (SINGULAIR) 10 MG tablet Take 10 mg by mouth at bedtime.   Yes [provider]  multivitamin (RENA-VIT) TABS tablet Take 1 tablet by mouth daily.   Yes [provider]  pantoprazole (PROTONIX) 40 MG tablet Take 40 mg by mouth daily.   Yes [provider]      VITAL SIGNS:  Blood pressure 105/86, pulse (!) 139, temperature 98.4 F (36.9 C), temperature source Oral, resp. rate 19, height 5\' 11"  (1.803 m), weight 111.1 kg (245 lb), SpO2 96 %.  PHYSICAL  EXAMINATION:  Physical Exam  GENERAL:  76 y.o.-year-old patient lying in the bed in no acute distress.  EYES: Pupils equal, round, reactive to light and accommodation. No scleral icterus. Extraocular muscles intact.  HEENT: Head atraumatic, normocephalic. Oropharynx and nasopharynx clear. No oropharyngeal erythema, moist oral mucosa  NECK:  Supple, no jugular venous distention. No thyroid enlargement, no tenderness.  LUNGS: Normal breath sounds bilaterally, no wheezing, rales, rhonchi. No use of accessory muscles of respiration.  CARDIOVASCULAR: S1, S2 Irregular. No murmurs, rubs, gallops, clicks.  ABDOMEN: Soft, nontender, nondistended. Bowel  sounds present. No organomegaly or mass.  EXTREMITIES: No pedal edema, cyanosis, or clubbing. + 2 pedal & radial pulses b/l.   NEUROLOGIC: Cranial nerves II through XII are intact. No focal Motor or sensory deficits appreciated b/l PSYCHIATRIC: The patient is alert and oriented x 3.  SKIN: No obvious rash, lesion, or ulcer.   LABORATORY PANEL:   CBC  Recent Labs Lab 06/17/17 1430  WBC 13.4*  HGB 12.6*  HCT 37.6*  PLT 209   ------------------------------------------------------------------------------------------------------------------  Chemistries   Recent Labs Lab 06/17/17 1430  NA 136  K 3.7  CL 94*  CO2 31  GLUCOSE 113*  BUN 20  CREATININE 3.72*  CALCIUM 8.3*   ------------------------------------------------------------------------------------------------------------------  Cardiac Enzymes  Recent Labs Lab 06/17/17 1430  TROPONINI 0.05*   ------------------------------------------------------------------------------------------------------------------  RADIOLOGY:  Dg Chest 2 View  Result Date: 06/17/2017 CLINICAL DATA:  Pt had a fast heart rate today at dialysis. Hx of cardiac stents, pneumonia, bronchitis, asthma, hypertension, COPD, prostate cancer. Former smoker EXAM: CHEST  2 VIEW COMPARISON:  11/20/2016 FINDINGS: Cardiac silhouette is normal in size. No mediastinal or hilar masses. No evidence of adenopathy. Lungs are mildly hyperexpanded but clear. No pleural effusion or pneumothorax. There is a mild wedge-shaped compression deformity at the thoracolumbar junction, stable from the prior exam. Bony thorax is demineralized but otherwise unremarkable. IMPRESSION: No active cardiopulmonary disease. Electronically Signed   By: Lajean Manes M.D.   On: 06/17/2017 15:24     IMPRESSION AND PLAN:   76 year old male with past medical history of end-stage renal disease on hemodialysis, COPD, GERD, hypertension, hyperlipidemia, prostate cancer who presented to the  hospital due to tachycardia.  1. SVT/A. Flutter w/ RVR-patient presented to the hospital with heart rates in the 140s. Patient given adenosine 2 and noted to be in atrial flutter. -Patient has been started on amiodarone drip by the ER physician. I will start the patient on some oral Cardizem and continue amiodarone for now. -we'll get a cardiology consult, get a two-dimensional cardiogram. -We will also start the patient on a heparin drip.  2. End-stage renal disease on hemodialysis-patient gets dialysis on Monday Wednesday and Friday. He finished his dialysis today. -No acute need for dialysis presently.  3. Hyperlipidemia-continue atorvastatin.  4. History of gout-no acute attack. Continue Uloric, Colchicine  5. GERD - cont. Protonix.   6. Secondary Hyperparathyroidism - cont. Phoslo  7. COPD - no acute exacerbation.  - cont. Dulera, albuterol inhaler as needed.   All the records are reviewed and case discussed with ED provider. Management plans discussed with the patient, family and they are in agreement.  CODE STATUS: Full code  TOTAL TIME TAKING CARE OF THIS PATIENT: 45 minutes.    Henreitta Leber M.D on 06/17/2017 at 5:08 PM  Between 7am to 6pm - Pager - [phone]  After 6pm go to [email or web address] - password [email or web address] [facility]  [facility]  Office  [phone]  CC: Primary  care physician; Madelyn Brunner, MD

## 2017-06-17 NOTE — [place] Notes (Signed)
   06/17/17 2038  Clinical Encounter Type  Visited With Patient  Visit Type Initial;Other (Comment) (Advance Directive)  Referral From Nurse  Spiritual Encounters  Spiritual Needs Literature  Stress Factors  Patient Stress Factors Health changes  Advance Directives (For Healthcare)  Does Patient Have a Medical Advance Directive? Yes  Does patient want to make changes to medical advance directive? Yes (Inpatient - patient requests chaplain consult to change a medical advance directive)  Type of Advance Directive Healthcare Power of [place] Directives  Does Patient Have a [place] Directive? No  Would patient like information on creating a mental health advance directive? Yes (Inpatient - patient defers creating a mental health advance directive at this time);No - Patient declined   Chaplain educated patient about Forensic scientist. Patient stated he wanted to complete the paperwork right know as he didn't want to put it off any longer.  Chaplain contacted [facility] and staff assisted in finding two volunteers. Patient completed his Advance Directive. Chaplain made a copy of the paperwork and gave it to the patient's nurse. Chaplain returned the original signed documents to the patient.

## 2017-06-17 NOTE — ED Triage Notes (Signed)
Says heart raqte is up. They noticed at dialysis today. He did get dailyzed.

## 2017-06-17 NOTE — ED Provider Notes (Addendum)
[facility] Emergency Department Provider Note  ____________________________________________   First MD Initiated Contact with Patient 06/17/17 1436     (approximate)  76 have reviewed the triage vital signs and the nursing notes.   HISTORY  Chief Complaint Tachycardia   HPI Maurice Peterson is a 76 y.o. male with a history of coronary artery disease who is presenting to the emergency department today with tachycardia. He says that he went to dialysis this morning and he was told that he had a heart rate in the 140s. He says that he did not have this heart rate this past Wednesday when he was previously dialyzed. He did not notice any palpitations. Denies any chest pain. Says that he has been weak over the past several months but this has not worsened as of today. Patient was dialyzed and then was having persistent heart rate in the 140s thereafter. Was sent to the emergency department for further evaluation. No history of atrial fibrillation/flutter. Patient did take his long-acting metoprolol this morning after being dialyzed.   Past Medical History:  Diagnosis Date  . Arthritis   . Chronic kidney disease    requires dialysis  . COPD (chronic obstructive pulmonary disease) ([place])   . GERD (gastroesophageal reflux disease)   . Hyperlipidemia   . Hypertension   . Prostate cancer ([place])   . Shortness of breath dyspnea     Patient Active Problem List   Diagnosis Date Noted  . End stage renal disease ([place]) 11/08/2016  . Complication of vascular access for dialysis 11/08/2016  . Essential hypertension, benign 11/08/2016  . Hypercholesterolemia 11/08/2016    Past Surgical History:  Procedure Laterality Date  . CATARACT EXTRACTION W/PHACO Right 08/20/2015   Procedure: CATARACT EXTRACTION PHACO AND INTRAOCULAR LENS PLACEMENT ([place]);  Surgeon: Leandrew Koyanagi, MD;  Location: [facility];  Service: Ophthalmology;  Laterality: Right;  . CATARACT  EXTRACTION W/PHACO Left 09/10/2015   Procedure: CATARACT EXTRACTION PHACO AND INTRAOCULAR LENS PLACEMENT ([facility]);  Surgeon: Leandrew Koyanagi, MD;  Location: [facility];  Service: Ophthalmology;  Laterality: Left;  . CORONARY STENT PLACEMENT    . 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 PERIPHERAL VASCULAR CATHETERIZATION Left 05/20/2015   Procedure: A/V Shuntogram/Fistulagram;  Surgeon: Katha Cabal, MD;  Location: [place] [facility];  Service: Cardiovascular;  Laterality: Left;  . PERIPHERAL VASCULAR CATHETERIZATION N/A 05/20/2015   Procedure: A/V Shunt Intervention;  Surgeon: Katha Cabal, MD;  Location: [facility];  Service: Cardiovascular;  Laterality: N/A;  . PERIPHERAL VASCULAR CATHETERIZATION Left 08/05/2015   Procedure: A/V Shuntogram/Fistulagram;  Surgeon: Katha Cabal, MD;  Location: [facility];  Service: Cardiovascular;  Laterality: Left;  . PERIPHERAL VASCULAR CATHETERIZATION N/A 08/05/2015   Procedure: A/V Shunt Intervention;  Surgeon: Katha Cabal, MD;  Location: [facility];  Service: Cardiovascular;  Laterality: N/A;  . PERIPHERAL VASCULAR CATHETERIZATION Left 01/06/2016   Procedure: A/V Shuntogram/Fistulagram;  Surgeon: Katha Cabal, MD;  Location: [place] [facility];  Service: Cardiovascular;  Laterality: Left;  . PERIPHERAL VASCULAR CATHETERIZATION N/A 01/06/2016   Procedure: A/V Shunt Intervention;  Surgeon: Katha Cabal, MD;  Location: [facility] [facility];  Service: Cardiovascular;  Laterality: N/A;  . PERIPHERAL VASCULAR CATHETERIZATION N/A 12/13/2016   Procedure: Dialysis/Perma Catheter Removal;  Surgeon: Algernon Huxley, MD;  Location: [place] [facility];  Service: Cardiovascular;  Laterality: N/A;    Prior to Admission medications  Medication Sig Start Date End Date Taking? Authorizing Provider  albuterol (PROVENTIL HFA;VENTOLIN HFA) 108 (90 BASE) MCG/ACT inhaler Inhale 2 puffs into the lungs  every 4 (four) hours as needed for wheezing or shortness of breath.    [provider]  aspirin 81 MG tablet Take 81 mg by mouth daily.    [provider]  atorvastatin (LIPITOR) 10 MG tablet Take 10 mg by mouth daily at 6 PM.     [provider]  budesonide-formoterol (SYMBICORT) 160-4.5 MCG/ACT inhaler Inhale 2 puffs into the lungs 2 (two) times daily.    [provider]  calcium acetate (PHOSLO) 667 MG capsule Take 1,334 mg by mouth 3 (three) times daily with meals.    [provider]  calcium carbonate (TUMS - DOSED IN MG ELEMENTAL CALCIUM) 500 MG chewable tablet Chew 1 tablet by mouth 2 (two) times daily.    [provider]  Cholecalciferol (VITAMIN D3) 1000 UNITS CAPS Take 2,000 Units by mouth daily.     [provider]  colchicine 0.6 MG tablet Take 0.6 mg by mouth daily as needed. Reported on 01/06/2016    [provider]  DiphenhydrAMINE HCl, Sleep, (ZZZQUIL PO) Take 25 mg by mouth at bedtime as needed.    [provider]  febuxostat (ULORIC) 40 MG tablet Take 40 mg by mouth daily.     [provider]  furosemide (LASIX) 20 MG tablet Take 20 mg by mouth. Tues, Thurs, Sat    [provider]  furosemide (LASIX) 40 MG tablet Take 40 mg by mouth.    [provider]  losartan (COZAAR) 25 MG tablet Take 25 mg by mouth daily.    [provider]  metoprolol succinate (TOPROL-XL) 50 MG 24 hr tablet Take 50 mg by mouth daily. Take with or immediately following a meal.    [provider]  montelukast (SINGULAIR) 10 MG tablet Take 10 mg by mouth at bedtime.    [provider]  multivitamin (RENA-VIT) TABS tablet Take 1 tablet by mouth daily.    [provider]  pantoprazole (PROTONIX) 40 MG tablet Take 40 mg by mouth daily.    [provider]    Allergies Ace inhibitors; Amoxicillin-pot clavulanate; and Other  No family history on file.  Social  History Social History  Substance Use Topics  . Smoking status: Former Smoker    Packs/day: 1.00    Types: Cigarettes    Quit date: 05/19/2009  . Smokeless tobacco: Never Used  . Alcohol use No    Review of Systems  Constitutional: No fever/chills Eyes: No visual changes. ENT: No sore throat. Cardiovascular: Denies chest pain. Respiratory: Denies shortness of breath. Gastrointestinal: No abdominal pain.  No nausea, no vomiting.  No diarrhea.  No constipation. Genitourinary: Negative for dysuria. Musculoskeletal: Negative for back pain. Skin: Negative for rash. Neurological: Negative for headaches, focal weakness or numbness.   ____________________________________________   PHYSICAL EXAM:  VITAL SIGNS: ED Triage Vitals  Enc Vitals Group     BP 06/17/17 1418 117/86     Pulse Rate 06/17/17 1418 (!) 137     Resp 06/17/17 1418 16     Temp 06/17/17 1418 98.4 F (36.9 C)     Temp Source 06/17/17 1418 Oral     SpO2 06/17/17 1418 98 %     Weight 06/17/17 1429 245 lb (111.1 kg)     Height 06/17/17 1429 5\' 11"  (1.803 m)     Head Circumference --  Peak Flow --      Pain Score --      Pain Loc --      Pain Edu? --      Excl. in [place]? --     Constitutional: Alert and oriented. Well appearing and in no acute distress. Eyes: Conjunctivae are normal.  Head: Atraumatic. Nose: No congestion/rhinnorhea. Mouth/Throat: Mucous membranes are moist.  Neck: No stridor.   Cardiovascular: Tachycardic, regular rhythm. Grossly normal heart sounds.  Palpable thrill to the left forearm fistula. Respiratory: Normal respiratory effort.  No retractions. Lungs CTAB. Gastrointestinal: Soft and nontender. No distention.  Musculoskeletal: No lower extremity tenderness nor edema.  No joint effusions. Neurologic:  Normal speech and language. No gross focal neurologic deficits are appreciated. Skin:  Skin is warm, dry and intact. No rash noted. Psychiatric: Mood and affect are normal. Speech and  behavior are normal.  ____________________________________________   LABS (all labs ordered are listed, but only abnormal results are displayed)  Labs Reviewed  BASIC METABOLIC PANEL - Abnormal; Notable for the following:       Result Value   Chloride 94 (*)    Glucose, Bld 113 (*)    Creatinine, Ser 3.72 (*)    Calcium 8.3 (*)    GFR calc non Af Amer 15 (*)    GFR calc Af Amer 17 (*)    All other components within normal limits  CBC - Abnormal; Notable for the following:    WBC 13.4 (*)    RBC 4.02 (*)    Hemoglobin 12.6 (*)    HCT 37.6 (*)    All other components within normal limits  TROPONIN I - Abnormal; Notable for the following:    Troponin I 0.05 (*)    All other components within normal limits   ____________________________________________  EKG  ED ECG REPORT I, Doran Stabler, the attending physician, personally viewed and interpreted this ECG.   Date: 06/17/2017  EKG Time: 1422  Rate: 148  Rhythm: svt  Axis: left  Intervals: Borderline wide complex  ST&T Change: No ST segment elevation or depression. No abnormal T-wave inversion.  ____________________________________________  RADIOLOGY  No active cardiopulmonary disease. ____________________________________________   PROCEDURES  Procedure(s) performed:   Procedures  Critical Care performed:  CRITICAL CARE Performed by: Doran Stabler   Total critical care time: 35 minutes  Critical care time was exclusive of separately billable procedures and treating other patients.  Critical care was necessary to treat or prevent imminent or life-threatening deterioration.  Critical care was time spent personally by me on the following activities: development of treatment plan with patient and/or surrogate as well as nursing, discussions with consultants, evaluation of patient's response to treatment, examination of patient, obtaining history from patient or surrogate, ordering and performing  treatments and interventions, ordering and review of laboratory studies, ordering and review of radiographic studies, pulse oximetry and re-evaluation of patient's condition.   ____________________________________________   INITIAL IMPRESSION / ASSESSMENT AND PLAN / ED COURSE  Pertinent labs & imaging results that were available during my care of the patient were reviewed by me and considered in my medical decision making (see chart for details).  Attempted vagal maneuvers which did not break the SVT. Patient hypotensive in the 90s over 80s. We'll start the patient on amiodarone drip.    ----------------------------------------- 3:58 PM on 06/17/2017 -----------------------------------------  Discussed case with Dr. call with cardiology. Initially the patient was borderline hypotensive in the 90s. He had taken metoprolol at  10 AM this morning. He thought that it would be dangerous to give the patient a bolus of Cardizem because of the already low blood pressure and that the patient had already taken metoprolol. Did not give Lopressor because of the already low blood pressure at the time. Decided to start the patient on a drip of amiodarone. Also given small fluid bolus. At this time the patient's heart rate is slightly lower at 137. I discussed with him the need for admission the hospital. He is understanding the plan and willing to comply. Signed out to Dr. Verdell Carmine. Underlying rhythm which was likely an atrial flutter given the speed and regularity.  ____________________________________________   FINAL CLINICAL IMPRESSION(S) / ED DIAGNOSES   Tachycardia    NEW MEDICATIONS STARTED DURING THIS VISIT:  New Prescriptions   No medications on file     Note:  This document was prepared using Dragon voice recognition software and may include unintentional dictation errors.     Orbie Pyo, MD 06/17/17 1559  EKG machine read as wide complex. However, the patient has a  history of a right bundle branch block and this is likely the cause of the appearance of the patient's current EKG.    Orbie Pyo, MD 06/17/17 1600

## 2017-06-18 ENCOUNTER — Inpatient Hospital Stay
Admit: 2017-06-18 | Discharge: 2017-06-18 | Disposition: A | Discharge status: Home / Self Care | Payer: Medicare Other | Attending: Specialist | Admitting: Specialist

## 2017-06-18 LAB — BASIC METABOLIC PANEL
ANION GAP: 11 (ref 5–15)
BUN: 28 mg/dL — AB (ref 6–20)
CO2: 30 mmol/L (ref 22–32)
Calcium: 8.2 mg/dL — ABNORMAL LOW (ref 8.9–10.3)
Chloride: 95 mmol/L — ABNORMAL LOW (ref 101–111)
Creatinine, Ser: 4.69 mg/dL — ABNORMAL HIGH (ref 0.61–1.24)
GFR, EST AFRICAN AMERICAN: 13 mL/min — AB (ref >60)
GFR, EST NON AFRICAN AMERICAN: 11 mL/min — AB (ref >60)
Glucose, Bld: 112 mg/dL — ABNORMAL HIGH (ref 65–99)
POTASSIUM: 3.7 mmol/L (ref 3.5–5.1)
SODIUM: 136 mmol/L (ref 135–145)

## 2017-06-18 LAB — CBC
HCT: 35.3 % — ABNORMAL LOW (ref 40.0–52.0)
HEMOGLOBIN: 11.9 g/dL — AB (ref 13.0–18.0)
MCH: 31.5 pg (ref 26.0–34.0)
MCHC: 33.7 g/dL (ref 32.0–36.0)
MCV: 93.4 fL (ref 80.0–100.0)
PLATELETS: 190 10*3/uL (ref 150–440)
RBC: 3.78 MIL/uL — AB (ref 4.40–5.90)
RDW: 14.7 % — ABNORMAL HIGH (ref 11.5–14.5)
WBC: 13.7 10*3/uL — AB (ref 3.8–10.6)

## 2017-06-18 LAB — HEPARIN LEVEL (UNFRACTIONATED)
HEPARIN UNFRACTIONATED: 0.3 [IU]/mL (ref 0.30–0.70)
HEPARIN UNFRACTIONATED: 0.77 [IU]/mL — AB (ref 0.30–0.70)
Heparin Unfractionated: 0.43 IU/mL (ref 0.30–0.70)

## 2017-06-18 LAB — TROPONIN I: TROPONIN I: 0.06 ng/mL — AB (ref <0.03)

## 2017-06-18 MED ORDER — AMIODARONE HCL 200 MG PO TABS
200.0000 mg | ORAL_TABLET | Freq: Two times a day (BID) | ORAL | Status: DC
  Administered 2017-06-18 – 2017-06-19 (×3): 200 mg via ORAL
  Filled 2017-06-18 (×2): qty 1

## 2017-06-18 MED ORDER — CARVEDILOL 6.25 MG PO TABS
6.2500 mg | ORAL_TABLET | Freq: Two times a day (BID) | ORAL | Status: DC
  Administered 2017-06-18 – 2017-06-19 (×2): 6.25 mg via ORAL
  Filled 2017-06-18 (×2): qty 1

## 2017-06-18 MED ORDER — APIXABAN 5 MG PO TABS
5.0000 mg | ORAL_TABLET | Freq: Two times a day (BID) | ORAL | Status: DC
  Administered 2017-06-18 – 2017-06-19 (×3): 5 mg via ORAL
  Filled 2017-06-18 (×3): qty 1

## 2017-06-18 NOTE — [place] Notes (Signed)
NSR. Gtt were d/c. Pt reports no pain. Wife at the bedside. A & O. Room air. Takes meds ok. Pt ha sno further concerns at this time.

## 2017-06-18 NOTE — Progress Notes (Signed)
ANTICOAGULATION CONSULT NOTE - Initial Consult  Pharmacy Consult for Heparin  Indication: atrial fibrillation  Allergies  Allergen Reactions  . Ace Inhibitors Nausea And Vomiting  . Amoxicillin-Pot Clavulanate Nausea And Vomiting  . Other Nausea And Vomiting    Anti-inflammatories    Patient Measurements: Height: 5\' 11"  (180.3 cm) Weight: 248 lb 1.6 oz (112.5 kg) IBW/kg (Calculated) : 75.3 Heparin Dosing Weight: 99.2 kg   Vital Signs: Temp: 98.3 F (36.8 C) (06/22 1944) Temp Source: Oral (06/22 1944) BP: 90/67 (06/23 0249) Pulse Rate: 62 (06/23 0249)  Labs:  Recent Labs  06/17/17 1430 06/17/17 1907 06/18/17 0237  HGB 12.6*  --  11.9*  HCT 37.6*  --  35.3*  PLT 209  --  190  APTT 34  --   --   LABPROT 13.8  --   --   INR 1.06  --   --   HEPARINUNFRC  --   --  0.43  CREATININE 3.72*  --  4.69*  TROPONINI 0.05* 0.06* 0.06*    Estimated Creatinine Clearance: 17.4 mL/min (A) (by C-G formula based on SCr of 4.69 mg/dL (H)).   Medical History: Past Medical History:  Diagnosis Date  . Arthritis   . Chronic kidney disease    requires dialysis  . COPD (chronic obstructive pulmonary disease) ([place])   . GERD (gastroesophageal reflux disease)   . Hyperlipidemia   . Hypertension   . Prostate cancer ([place])   . Shortness of breath dyspnea     Medications:  Prescriptions Prior to Admission  Medication Sig Dispense Refill Last Dose  . albuterol (PROVENTIL HFA;VENTOLIN HFA) 108 (90 BASE) MCG/ACT inhaler Inhale 2 puffs into the lungs every 4 (four) hours as needed for wheezing or shortness of breath.   PRN at PRN  . aspirin 81 MG tablet Take 81 mg by mouth daily.   06/17/2017 at 0800  . atorvastatin (LIPITOR) 10 MG tablet Take 10 mg by mouth daily at 6 PM.    06/16/2017 at 1800  . budesonide-formoterol (SYMBICORT) 160-4.5 MCG/ACT inhaler Inhale 2 puffs into the lungs 2 (two) times daily.   06/17/2017 at 0800  . calcium acetate (PHOSLO) 667 MG capsule Take 1,334 mg by mouth  3 (three) times daily with meals.   06/17/2017 at 0800  . Cholecalciferol (VITAMIN D3) 1000 UNITS CAPS Take 2,000 Units by mouth daily.    06/17/2017 at 0800  . colchicine 0.6 MG tablet Take 0.6 mg by mouth daily as needed. Reported on 01/06/2016   PRN at PRN  . DiphenhydrAMINE HCl, Sleep, (ZZZQUIL PO) Take 25 mg by mouth at bedtime as needed.   PRN at PRN  . febuxostat (ULORIC) 40 MG tablet Take 40 mg by mouth daily.    06/16/2017 at 2000  . furosemide (LASIX) 20 MG tablet Take 80 mg by mouth daily.    06/17/2017 at 0800  . losartan (COZAAR) 25 MG tablet Take 25 mg by mouth daily.   06/17/2017 at 0800  . metoprolol succinate (TOPROL-XL) 50 MG 24 hr tablet Take 50 mg by mouth daily. Take with or immediately following a meal.   06/17/2017 at 0800  . montelukast (SINGULAIR) 10 MG tablet Take 10 mg by mouth at bedtime.   06/16/2017 at 2000  . multivitamin (RENA-VIT) TABS tablet Take 1 tablet by mouth daily.   06/17/2017 at 0800  . pantoprazole (PROTONIX) 40 MG tablet Take 40 mg by mouth daily.   06/17/2017 at 0800    Assessment: Pharmacy consulted to  dose heparin in this 76 year old male admitted with A fib.  No prior anticoag noted. CrCl = 21.7 ml/min  Goal of Therapy:  Heparin level 0.3-0.7 units/ml Monitor platelets by anticoagulation protocol: Yes   Plan:  Give 5000 units bolus x 1 Start heparin infusion at 1500 units/hr Check anti-Xa level in 8 hours and daily while on heparin Continue to monitor H&H and platelets   6/22 @ 0230 HL 0.43 therapeutic. Will continue current rate and will recheck HL @ 1030.  Tobie Lords, PharmD, BCPS Clinical Pharmacist 06/18/2017

## 2017-06-18 NOTE — Consult Note (Signed)
ANTICOAGULATION CONSULT NOTE - Initial Consult  Pharmacy Consult for apixaban Indication: atrial fibrillation  Allergies  Allergen Reactions  . Ace Inhibitors Nausea And Vomiting  . Amoxicillin-Pot Clavulanate Nausea And Vomiting  . Other Nausea And Vomiting    Anti-inflammatories    Patient Measurements: Height: 5\' 11"  (180.3 cm) Weight: 248 lb 1.6 oz (112.5 kg) IBW/kg (Calculated) : 75.3 Heparin Dosing Weight:   Vital Signs: Temp: 98.5 F (36.9 C) (06/23 1119) Temp Source: Oral (06/23 1119) BP: 107/67 (06/23 1119) Pulse Rate: 87 (06/23 1119)  Labs:  Recent Labs  06/17/17 1430 06/17/17 1907 06/18/17 0237 06/18/17 1025  HGB 12.6*  --  11.9*  --   HCT 37.6*  --  35.3*  --   PLT 209  --  190  --   APTT 34  --   --   --   LABPROT 13.8  --   --   --   INR 1.06  --   --   --   HEPARINUNFRC  --   --  0.43 0.30  CREATININE 3.72*  --  4.69*  --   TROPONINI 0.05* 0.06* 0.06*  --     Estimated Creatinine Clearance: 17.4 mL/min (A) (by C-G formula based on SCr of 4.69 mg/dL (H)).   Medical History: Past Medical History:  Diagnosis Date  . Arthritis   . Chronic kidney disease    requires dialysis  . COPD (chronic obstructive pulmonary disease) ([place])   . GERD (gastroesophageal reflux disease)   . Hyperlipidemia   . Hypertension   . Prostate cancer ([place])   . Shortness of breath dyspnea     Medications:  Scheduled:  . amiodarone  200 mg Oral BID  . apixaban  5 mg Oral BID  . aspirin EC  81 mg Oral Daily  . atorvastatin  10 mg Oral q1800  . calcium acetate  1,334 mg Oral TID WC  . carvedilol  6.25 mg Oral BID WC  . cholecalciferol  2,000 Units Oral Daily  . febuxostat  40 mg Oral Daily  . furosemide  80 mg Oral Daily  . losartan  25 mg Oral Daily  . mometasone-formoterol  2 puff Inhalation BID  . montelukast  10 mg Oral QHS  . multivitamin  1 tablet Oral Daily  . pantoprazole  40 mg Oral Daily    Assessment: Patient is a 76 year old male with afib.  Patient was on a heparin drip, now being transitioned to apixaban.   Goal of Therapy:   Monitor platelets by anticoagulation protocol: Yes   Plan:  Apixaban 5mg  BID. CBC and Scr q 72 hours while inpatient  Ramond Dial, Pharm.D, BCPS Clinical Pharmacist  06/18/2017,2:18 PM

## 2017-06-18 NOTE — Progress Notes (Signed)
ANTICOAGULATION CONSULT NOTE - Initial Consult  Pharmacy Consult for Heparin  Indication: atrial fibrillation  Allergies  Allergen Reactions  . Ace Inhibitors Nausea And Vomiting  . Amoxicillin-Pot Clavulanate Nausea And Vomiting  . Other Nausea And Vomiting    Anti-inflammatories    Patient Measurements: Height: 5\' 11"  (180.3 cm) Weight: 248 lb 1.6 oz (112.5 kg) IBW/kg (Calculated) : 75.3 Heparin Dosing Weight: 99.2 kg   Vital Signs: Temp: 98.5 F (36.9 C) (06/23 1119) Temp Source: Oral (06/23 1119) BP: 107/67 (06/23 1119) Pulse Rate: 87 (06/23 1119)  Labs:  Recent Labs  06/17/17 1430 06/17/17 1907 06/18/17 0237 06/18/17 1025  HGB 12.6*  --  11.9*  --   HCT 37.6*  --  35.3*  --   PLT 209  --  190  --   APTT 34  --   --   --   LABPROT 13.8  --   --   --   INR 1.06  --   --   --   HEPARINUNFRC  --   --  0.43 0.30  CREATININE 3.72*  --  4.69*  --   TROPONINI 0.05* 0.06* 0.06*  --     Estimated Creatinine Clearance: 17.4 mL/min (A) (by C-G formula based on SCr of 4.69 mg/dL (H)).   Medical History: Past Medical History:  Diagnosis Date  . Arthritis   . Chronic kidney disease    requires dialysis  . COPD (chronic obstructive pulmonary disease) ([place])   . GERD (gastroesophageal reflux disease)   . Hyperlipidemia   . Hypertension   . Prostate cancer ([place])   . Shortness of breath dyspnea     Medications:  Prescriptions Prior to Admission  Medication Sig Dispense Refill Last Dose  . albuterol (PROVENTIL HFA;VENTOLIN HFA) 108 (90 BASE) MCG/ACT inhaler Inhale 2 puffs into the lungs every 4 (four) hours as needed for wheezing or shortness of breath.   PRN at PRN  . aspirin 81 MG tablet Take 81 mg by mouth daily.   06/17/2017 at 0800  . atorvastatin (LIPITOR) 10 MG tablet Take 10 mg by mouth daily at 6 PM.    06/16/2017 at 1800  . budesonide-formoterol (SYMBICORT) 160-4.5 MCG/ACT inhaler Inhale 2 puffs into the lungs 2 (two) times daily.   06/17/2017 at 0800  .  calcium acetate (PHOSLO) 667 MG capsule Take 1,334 mg by mouth 3 (three) times daily with meals.   06/17/2017 at 0800  . Cholecalciferol (VITAMIN D3) 1000 UNITS CAPS Take 2,000 Units by mouth daily.    06/17/2017 at 0800  . colchicine 0.6 MG tablet Take 0.6 mg by mouth daily as needed. Reported on 01/06/2016   PRN at PRN  . DiphenhydrAMINE HCl, Sleep, (ZZZQUIL PO) Take 25 mg by mouth at bedtime as needed.   PRN at PRN  . febuxostat (ULORIC) 40 MG tablet Take 40 mg by mouth daily.    06/16/2017 at 2000  . furosemide (LASIX) 20 MG tablet Take 80 mg by mouth daily.    06/17/2017 at 0800  . losartan (COZAAR) 25 MG tablet Take 25 mg by mouth daily.   06/17/2017 at 0800  . metoprolol succinate (TOPROL-XL) 50 MG 24 hr tablet Take 50 mg by mouth daily. Take with or immediately following a meal.   06/17/2017 at 0800  . montelukast (SINGULAIR) 10 MG tablet Take 10 mg by mouth at bedtime.   06/16/2017 at 2000  . multivitamin (RENA-VIT) TABS tablet Take 1 tablet by mouth daily.   06/17/2017  at 0800  . pantoprazole (PROTONIX) 40 MG tablet Take 40 mg by mouth daily.   06/17/2017 at 0800    Assessment: Pharmacy consulted to dose heparin in this 76 year old male admitted with A fib.  No prior anticoag noted. CrCl = 21.7 ml/min  Goal of Therapy:  Heparin level 0.3-0.7 units/ml Monitor platelets by anticoagulation protocol: Yes   Plan:  As heparin level remains at goal but at low end of range, will increase heparin  infusion to 1600 units/hr and recheck HL in 8 hours.   Ulice Dash, PharmD Clinical Pharmacist   06/18/2017

## 2017-06-18 NOTE — Consult Note (Signed)
Reason for Consult: tachycardia atrial flutter Referring Physician: Dr. Lisette Grinder III,   Maurice Peterson is an 76 y.o. male.  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 Patient denieany chest pain and palpitations th heart rate was 140 now complex. Denies any chest pain n blackout spells syncope feels reasonably welld shortness of breath. For follow-up and his admis  Past Medical History:  Diagnosis Date  . Arthritis   . Chronic kidney disease    requires dialysis  . COPD (chronic obstructive pulmonary disease) ([place])   . GERD (gastroesophageal reflux disease)   . Hyperlipidemia   . Hypertension   . Prostate cancer ([place])   . Shortness of breath dyspnea     Past Surgical History:  Procedure Laterality Date  . CATARACT EXTRACTION W/PHACO Right 08/20/2015   Procedure: CATARACT EXTRACTION PHACO AND INTRAOCULAR LENS PLACEMENT ([place]);  Surgeon: Leandrew Koyanagi, MD;  Location: [facility];  Service: Ophthalmology;  Laterality: Right;  . CATARACT EXTRACTION W/PHACO Left 09/10/2015   Procedure: CATARACT EXTRACTION PHACO AND INTRAOCULAR LENS PLACEMENT ([facility]);  Surgeon: Leandrew Koyanagi, MD;  Location: [facility];  Service: Ophthalmology;  Laterality: Left;  . CORONARY STENT PLACEMENT    . 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 PERIPHERAL VASCULAR CATHETERIZATION Left 05/20/2015   Procedure: A/V Shuntogram/Fistulagram;  Surgeon: Katha Cabal, MD;  Location: [facility];  Service: Cardiovascular;  Laterality: Left;  . PERIPHERAL VASCULAR CATHETERIZATION N/A 05/20/2015   Procedure: A/V Shunt Intervention;  Surgeon: Katha Cabal, MD;  Location: [facility];  Service: Cardiovascular;  Laterality: N/A;  . PERIPHERAL VASCULAR CATHETERIZATION Left 08/05/2015   Procedure: A/V Shuntogram/Fistulagram;  Surgeon: Katha Cabal, MD;  Location: [facility];  Service: Cardiovascular;  Laterality: Left;  . PERIPHERAL VASCULAR CATHETERIZATION N/A 08/05/2015   Procedure: A/V Shunt Intervention;  Surgeon: Katha Cabal, MD;  Location: [facility];  Service: Cardiovascular;  Laterality: N/A;  . PERIPHERAL VASCULAR CATHETERIZATION Left 01/06/2016   Procedure: A/V Shuntogram/Fistulagram;  Surgeon: Katha Cabal, MD;  Location: [facility];  Service: Cardiovascular;  Laterality: Left;  . PERIPHERAL VASCULAR CATHETERIZATION N/A 01/06/2016   Procedure: A/V Shunt Intervention;  Surgeon: Katha Cabal, MD;  Location: [facility];  Service: Cardiovascular;  Laterality: N/A;  . PERIPHERAL VASCULAR CATHETERIZATION N/A 12/13/2016   Procedure: Dialysis/Perma Catheter Removal;  Surgeon: Algernon Huxley, MD;  Location: [facility];  Service: Cardiovascular;  Laterality: N/A;    Family History  Problem Relation Age of Onset  . Liver disease Mother   . Heart disease Mother   . Hypertension Mother   . Pancreatic cancer Father     Social History:  reports that he quit smoking about 8 years ago. His smoking use included Cigarettes. He has a 30.00 pack-year smoking history. He has never used smokeless tobacco. He reports that he does not drink alcohol or use drugs.  Allergies:  Allergies  Allergen Reactions  . Ace Inhibitors Nausea And Vomiting  . Amoxicillin-Pot Clavulanate Nausea And Vomiting  . Other Nausea And Vomiting    Anti-inflammatories    Medications: I have reviewed the patient's current medications.  Results for orders placed or performed during the hospital encounter of 06/17/17 (from the past 48 hour(s))  Basic metabolic panel     Status: Abnormal   Collection Time: 06/17/17  2:30 PM  Result Value Ref Range   Sodium 136 135 - 145 mmol/L   Potassium 3.7 3.5 - 5.1 mmol/L   Chloride 94 (L) 101 - 111 mmol/L   CO2 31 22 - 32 mmol/L   Glucose, Bld 113 (H) 65 - 99 mg/dL   BUN 20 6 - 20 mg/dL    Creatinine, Ser 3.72 (H) 0.61 - 1.24 mg/dL   Calcium 8.3 (L) 8.9 - 10.3 mg/dL   GFR calc non Af Amer 15 (L) >60 mL/min   GFR calc Af Amer 17 (L) >60 mL/min    Comment: (NOTE) The eGFR has been calculated using the CKD EPI equation. This calculation has not been validated in all clinical situations. eGFR's persistently <60 mL/min signify possible Chronic Kidney Disease.    Anion gap 11 5 - 15  CBC     Status: Abnormal   Collection Time: 06/17/17  2:30 PM  Result Value Ref Range   WBC 13.4 (H) 3.8 - 10.6 K/uL   RBC 4.02 (L) 4.40 - 5.90 MIL/uL   Hemoglobin 12.6 (L) 13.0 - 18.0 g/dL   HCT 37.6 (L) 40.0 - 52.0 %   MCV 93.7 80.0 - 100.0 fL   MCH 31.4 26.0 - 34.0 pg   MCHC 33.5 32.0 - 36.0 g/dL   RDW 14.4 11.5 - 14.5 %   Platelets 209 150 - 440 K/uL  Troponin I     Status: Abnormal   Collection Time: 06/17/17  2:30 PM  Result Value Ref Range   Troponin I 0.05 (HH) <0.03 ng/mL    Comment: CRITICAL RESULT CALLED TO, READ BACK BY AND VERIFIED WITH AMBER JONES AT 1504 ON 06/17/2017 JJB   APTT     Status: None   Collection Time: 06/17/17  2:30 PM  Result Value Ref Range   aPTT 34 24 - 36 seconds  Protime-INR     Status: None   Collection Time: 06/17/17  2:30 PM  Result Value Ref Range   Prothrombin Time 13.8 11.4 - 15.2 seconds   INR 1.06   Troponin I     Status: Abnormal   Collection Time: 06/17/17  7:07 PM  Result Value Ref Range   Troponin I 0.06 (HH) <0.03 ng/mL    Comment: CRITICAL VALUE NOTED. VALUE IS CONSISTENT WITH PREVIOUSLY REPORTED/CALLED VALUE RWW   MRSA PCR Screening     Status: None   Collection Time: 06/17/17  8:24 PM  Result Value Ref Range   MRSA by PCR NEGATIVE NEGATIVE    Comment:        The GeneXpert MRSA Assay (FDA approved for NASAL specimens only), is one component of a comprehensive MRSA colonization surveillance program. It is not intended to diagnose MRSA infection nor to guide or monitor treatment for MRSA infections.   CBC     Status:  Abnormal   Collection Time: 06/18/17  2:37 AM  Result Value Ref Range   WBC 13.7 (H) 3.8 - 10.6 K/uL   RBC 3.78 (L) 4.40 - 5.90 MIL/uL   Hemoglobin 11.9 (L) 13.0 - 18.0 g/dL   HCT 35.3 (L) 40.0 - 52.0 %   MCV 93.4 80.0 - 100.0 fL   MCH 31.5 26.0 - 34.0 pg   MCHC 33.7 32.0 - 36.0 g/dL   RDW 14.7 (H) 11.5 - 14.5 %   Platelets 190 150 - 440 K/uL  Basic metabolic panel     Status: Abnormal   Collection Time: 06/18/17  2:37 AM  Result Value  Ref Range   Sodium 136 135 - 145 mmol/L   Potassium 3.7 3.5 - 5.1 mmol/L   Chloride 95 (L) 101 - 111 mmol/L   CO2 30 22 - 32 mmol/L   Glucose, Bld 112 (H) 65 - 99 mg/dL   BUN 28 (H) 6 - 20 mg/dL   Creatinine, Ser 4.69 (H) 0.61 - 1.24 mg/dL   Calcium 8.2 (L) 8.9 - 10.3 mg/dL   GFR calc non Af Amer 11 (L) >60 mL/min   GFR calc Af Amer 13 (L) >60 mL/min    Comment: (NOTE) The eGFR has been calculated using the CKD EPI equation. This calculation has not been validated in all clinical situations. eGFR's persistently <60 mL/min signify possible Chronic Kidney Disease.    Anion gap 11 5 - 15  Troponin I     Status: Abnormal   Collection Time: 06/18/17  2:37 AM  Result Value Ref Range   Troponin I 0.06 (HH) <0.03 ng/mL    Comment: CRITICAL VALUE NOTED. VALUE IS CONSISTENT WITH PREVIOUSLY REPORTED/CALLED VALUE RWW   Heparin level (unfractionated)     Status: None   Collection Time: 06/18/17  2:37 AM  Result Value Ref Range   Heparin Unfractionated 0.43 0.30 - 0.70 IU/mL    Comment:        IF HEPARIN RESULTS ARE BELOW EXPECTED VALUES, AND PATIENT DOSAGE HAS BEEN CONFIRMED, SUGGEST FOLLOW UP TESTING OF ANTITHROMBIN III LEVELS.   Heparin level (unfractionated)     Status: None   Collection Time: 06/18/17 10:25 AM  Result Value Ref Range   Heparin Unfractionated 0.30 0.30 - 0.70 IU/mL    Comment:        IF HEPARIN RESULTS ARE BELOW EXPECTED VALUES, AND PATIENT DOSAGE HAS BEEN CONFIRMED, SUGGEST FOLLOW UP TESTING OF ANTITHROMBIN III  LEVELS.     Dg Chest 2 View  Result Date: 06/17/2017 CLINICAL DATA:  Pt had a fast heart rate today at dialysis. Hx of cardiac stents, pneumonia, bronchitis, asthma, hypertension, COPD, prostate cancer. Former smoker EXAM: CHEST  2 VIEW COMPARISON:  11/20/2016 FINDINGS: Cardiac silhouette is normal in size. No mediastinal or hilar masses. No evidence of adenopathy. Lungs are mildly hyperexpanded but clear. No pleural effusion or pneumothorax. There is a mild wedge-shaped compression deformity at the thoracolumbar junction, stable from the prior exam. Bony thorax is demineralized but otherwise unremarkable. IMPRESSION: No active cardiopulmonary disease. Electronically Signed   By: Lajean Manes M.D.   On: 06/17/2017 15:24    Review of Systems  Constitutional: Positive for malaise/fatigue.  HENT: Negative.   Eyes: Negative.   Respiratory: Negative.   Cardiovascular: Negative.   Gastrointestinal: Negative.   Genitourinary: Negative.   Musculoskeletal: Negative.   Skin: Negative.   Neurological: Negative.   Endo/Heme/Allergies: Negative.   Psychiatric/Behavioral: Negative.    Blood pressure 107/67, pulse 87, temperature 98.5 F (36.9 C), temperature source Oral, resp. rate 20, height 5' 11" (1.803 m), weight 112.5 kg (248 lb 1.6 oz), SpO2 98 %. Physical Exam  Nursing note and vitals reviewed. Constitutional: He appears well-developed and well-nourished.  HENT:  Head: Normocephalic and atraumatic.  Eyes: Conjunctivae and EOM are normal. Pupils are equal, round, and reactive to light.  Neck: Normal range of motion. Neck supple.  Cardiovascular: S1 normal, S2 normal and normal pulses.  An irregularly irregular rhythm present. Frequent extrasystoles are present. Tachycardia present.  PMI is displaced.  Exam reveals gallop and S3.   Murmur heard.  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 Plan Agree with admission to telemetry rule out for myocardial infarction Agree with evaluation of troponins and EKG Continue dialysis therapy for end-stage renal disease Consider inhalers for COPD therapy Agree with amiodarone therapy for rhythm control Recommend anticoagulation probably with Eliquis Continue rate control with beta blockers Consider heart failure therapy for cardiomyopathy continued with Coreg and Toprol Agree with Protonix for GI reflux symptoms  Dwayne D Callwood 06/18/2017, 2:13 PM

## 2017-06-18 NOTE — Progress Notes (Signed)
[place] at [place] NAME: Maurice Peterson    MR#:  [id number]  DATE OF BIRTH:  January 21, 1941  SUBJECTIVE:   Pt. Here due to tachycardia at HD and noted to be in a. Flutter.  Patient was placed on amiodarone drip and now has converted to a normal sinus rhythm. Clinically asymptomatic except for some mild shortness of breath.  REVIEW OF SYSTEMS:    Review of Systems  Constitutional: Negative for chills and fever.  HENT: Negative for congestion and tinnitus.   Eyes: Negative for blurred vision and double vision.  Respiratory: Negative for cough, shortness of breath and wheezing.   Cardiovascular: Negative for chest pain, orthopnea and PND.  Gastrointestinal: Negative for abdominal pain, diarrhea, nausea and vomiting.  Genitourinary: Negative for dysuria and hematuria.  Neurological: Negative for dizziness, sensory change and focal weakness.  All other systems reviewed and are negative.   Nutrition: Renal diet Tolerating Diet: Yes Tolerating PT: Ambulatory  DRUG ALLERGIES:   Allergies  Allergen Reactions  . Ace Inhibitors Nausea And Vomiting  . Amoxicillin-Pot Clavulanate Nausea And Vomiting  . Other Nausea And Vomiting    Anti-inflammatories    VITALS:  Blood pressure 107/67, pulse 87, temperature 98.5 F (36.9 C), temperature source Oral, resp. rate 20, height 5\' 11"  (1.803 m), weight 112.5 kg (248 lb 1.6 oz), SpO2 98 %.  PHYSICAL EXAMINATION:   Physical Exam  GENERAL:  76 y.o.-year-old patient lying in bed in no acute distress.  EYES: Pupils equal, round, reactive to light and accommodation. No scleral icterus. Extraocular muscles intact.  HEENT: Head atraumatic, normocephalic. Oropharynx and nasopharynx clear.  NECK:  Supple, no jugular venous distention. No thyroid enlargement, no tenderness.  LUNGS: Normal breath sounds bilaterally, no wheezing, rales, rhonchi. No use of accessory muscles of respiration.  CARDIOVASCULAR: S1, S2  normal. No murmurs, rubs, or gallops.  ABDOMEN: Soft, nontender, nondistended. Bowel sounds present. No organomegaly or mass.  EXTREMITIES: No cyanosis, clubbing or edema b/l.    NEUROLOGIC: Cranial nerves II through XII are intact. No focal Motor or sensory deficits b/l.   PSYCHIATRIC: The patient is alert and oriented x 3.  SKIN: No obvious rash, lesion, or ulcer.   Left upper extremity AV fistula with good bruit and good thrill.   LABORATORY PANEL:   CBC  Recent Labs Lab 06/18/17 0237  WBC 13.7*  HGB 11.9*  HCT 35.3*  PLT 190   ------------------------------------------------------------------------------------------------------------------  Chemistries   Recent Labs Lab 06/18/17 0237  NA 136  K 3.7  CL 95*  CO2 30  GLUCOSE 112*  BUN 28*  CREATININE 4.69*  CALCIUM 8.2*   ------------------------------------------------------------------------------------------------------------------  Cardiac Enzymes  Recent Labs Lab 06/18/17 0237  TROPONINI 0.06*   ------------------------------------------------------------------------------------------------------------------  RADIOLOGY:  Dg Chest 2 View  Result Date: 06/17/2017 CLINICAL DATA:  Pt had a fast heart rate today at dialysis. Hx of cardiac stents, pneumonia, bronchitis, asthma, hypertension, COPD, prostate cancer. Former smoker EXAM: CHEST  2 VIEW COMPARISON:  11/20/2016 FINDINGS: Cardiac silhouette is normal in size. No mediastinal or hilar masses. No evidence of adenopathy. Lungs are mildly hyperexpanded but clear. No pleural effusion or pneumothorax. There is a mild wedge-shaped compression deformity at the thoracolumbar junction, stable from the prior exam. Bony thorax is demineralized but otherwise unremarkable. IMPRESSION: No active cardiopulmonary disease. Electronically Signed   By: Lajean Manes M.D.   On: 06/17/2017 15:24     ASSESSMENT AND PLAN:   76 year old male with past medical  history of  end-stage renal disease on hemodialysis, COPD, GERD, hypertension, hyperlipidemia, prostate cancer who presented to the hospital due to tachycardia.  1. SVT/A. Flutter w/ RVR-patient presented to the hospital with heart rates in the 140s. Patient given adenosine 2 and noted to be in atrial flutter. - was on Amio gtt and converted to NSR.  We'll wean off amiodarone drip and started on oral amiodarone. -Echocardiogram showing LV dysfunction therefore started on some carvedilol for rate control. - d/c heparin gtt and start on Oral Eliquis for anti-coagulation.  - appreciate Cardiology input and discussed plan of care with Cardiology.   2. End-stage renal disease on hemodialysis-patient gets dialysis on Monday Wednesday and Friday. He finished his dialysis today. -No acute need for dialysis presently.  3. Hyperlipidemia-continue atorvastatin.  4. History of gout-no acute attack. Continue Uloric, Colchicine  5. GERD - cont. Protonix.   6. Secondary Hyperparathyroidism - cont. Phoslo  7. COPD - no acute exacerbation.  - cont. Dulera, albuterol inhaler as needed.    All the records are reviewed and case discussed with Care Management/Social Worker. Management plans discussed with the patient, family and they are in agreement.  CODE STATUS: Full code  DVT Prophylaxis: Eliquis  TOTAL TIME TAKING CARE OF THIS PATIENT: 30 minutes.   POSSIBLE D/C IN 1-2 DAYS, DEPENDING ON CLINICAL CONDITION.   Henreitta Leber M.D on 06/18/2017 at 2:10 PM  Between 7am to 6pm - Pager - [phone]  After 6pm go to [email or web address] - Proofreader  [facility]  Office  [phone]  CC: Primary care physician; Madelyn Brunner, MD

## 2017-06-18 NOTE — Progress Notes (Signed)
Notified Dr.Pyreddy of decline in BP, BP at 90/67 and still on amiodorone drip. Instructed to hold lasix and still give PO cardizem and continue amiodorone drip.

## 2017-06-19 LAB — CBC
HCT: 35.2 % — ABNORMAL LOW (ref 40.0–52.0)
Hemoglobin: 11.9 g/dL — ABNORMAL LOW (ref 13.0–18.0)
MCH: 31.7 pg (ref 26.0–34.0)
MCHC: 33.8 g/dL (ref 32.0–36.0)
MCV: 93.7 fL (ref 80.0–100.0)
PLATELETS: 183 10*3/uL (ref 150–440)
RBC: 3.76 MIL/uL — ABNORMAL LOW (ref 4.40–5.90)
RDW: 14.5 % (ref 11.5–14.5)
WBC: 14.2 10*3/uL — ABNORMAL HIGH (ref 3.8–10.6)

## 2017-06-19 LAB — ECHOCARDIOGRAM COMPLETE
HEIGHTINCHES: 71 in
WEIGHTICAEL: 3969.6 [oz_av]

## 2017-06-19 MED ORDER — APIXABAN 5 MG PO TABS: 5.0000 mg | ORAL_TABLET | Freq: Two times a day (BID) | ORAL | 1 refills | Status: DC

## 2017-06-19 MED ORDER — AMIODARONE HCL 200 MG PO TABS: ORAL_TABLET | ORAL | 1 refills | Status: DC

## 2017-06-19 MED ORDER — CARVEDILOL 6.25 MG PO TABS: 6.2500 mg | ORAL_TABLET | Freq: Two times a day (BID) | ORAL | 1 refills | Status: DC

## 2017-06-19 NOTE — Progress Notes (Signed)
Patient  Remains alert and oriented, denies any pain, rounded with MD, patient is being discharge home today , discharge instruction provided, iv removed tele removed .

## 2017-06-19 NOTE — Care Management Note (Signed)
Case Management Note  Patient Details  Name: Maurice Peterson MRN: [id number] Date of Birth: [birth year]  Subjective/Objective:       Provided [name] with an Eliquis coupon.              Action/Plan:   Expected Discharge Date:  06/19/17               Expected Discharge Plan:     In-House Referral:     Discharge planning Services     Post Acute Care Choice:    Choice offered to:     DME Arranged:    DME Agency:     HH Arranged:    HH Agency:     Status of Service:     If discussed at [employer], dates discussed:    Additional Comments:  Shauntee Karp A, RN 06/19/2017, 10:06 AM

## 2017-06-19 NOTE — Discharge Summary (Signed)
[name] at [place] NAME: Maurice Peterson    MR#:  [id number]  DATE OF BIRTH:  May 05, 1941  DATE OF ADMISSION:  06/17/2017 ADMITTING PHYSICIAN: Henreitta Leber, MD  DATE OF DISCHARGE: 06/19/2017 11:27 AM  PRIMARY CARE PHYSICIAN: Madelyn Brunner, MD    ADMISSION DIAGNOSIS:  Tachycardia [R00.0]  DISCHARGE DIAGNOSIS:  Active Problems:   Atrial flutter with rapid ventricular response ([place])   SECONDARY DIAGNOSIS:   Past Medical History:  Diagnosis Date  . Arthritis   . Chronic kidney disease    requires dialysis  . COPD (chronic obstructive pulmonary disease) ([place])   . GERD (gastroesophageal reflux disease)   . Hyperlipidemia   . Hypertension   . Prostate cancer ([place])   . Shortness of breath dyspnea     HOSPITAL COURSE:   76 year old male with past medical history of end-stage renal disease on hemodialysis, COPD, GERD, hypertension, hyperlipidemia, prostate cancer who presented to the hospital due to tachycardia.  1. SVT/A. Flutter w/ RVR-patient presented to the hospital with heart rates in the 140s. Patient given adenosine 2 and noted to be in atrial flutter. - pt. Was started on Amio gtt and converted to NSR.  Patient remains asymptomatic and hemodynamically stable. He has been weaned off the amiodarone drip and now started on oral amiodarone. He was also started on carvedilol for rate control given his LV dysfunction noted on the echocardiogram. -pt. Discharged on Eliquis for long term anti-coagulation and follow up with Cards as outpatient in 1-2 weeks. -Echocardiogram showing LV dysfunction with EF of 35% which was similar to his Echo in [place].    2. End-stage renal disease on hemodialysis-patient gets dialysis on Monday Wednesday and Friday. He will resume his schedule as outpatient. Followed by [facility].   3. Hyperlipidemia- he will continue atorvastatin.  4. History of gout-no acute attack. He will Continue Uloric,  Colchicine  5. GERD - he will cont. Protonix.   6. Secondary Hyperparathyroidism - he will cont. Phoslo  7. COPD - no acute exacerbation.  - he will cont. His symbicort, albuterol inhaler as needed  DISCHARGE CONDITIONS:   Stable  CONSULTS OBTAINED:  Treatment Team:  Yolonda Kida, MD  DRUG ALLERGIES:   Allergies  Allergen Reactions  . Ace Inhibitors Nausea And Vomiting  . Amoxicillin-Pot Clavulanate Nausea And Vomiting  . Other Nausea And Vomiting    Anti-inflammatories    DISCHARGE MEDICATIONS:   Allergies as of 06/19/2017      Reactions   Ace Inhibitors Nausea And Vomiting   Amoxicillin-pot Clavulanate Nausea And Vomiting   Other Nausea And Vomiting   Anti-inflammatories      Medication List    STOP taking these medications   metoprolol succinate 50 MG 24 hr tablet Commonly known as:  TOPROL-XL     TAKE these medications   albuterol 108 (90 Base) MCG/ACT inhaler Commonly known as:  PROVENTIL HFA;VENTOLIN HFA Inhale 2 puffs into the lungs every 4 (four) hours as needed for wheezing or shortness of breath.   amiodarone 200 MG tablet Commonly known as:  PACERONE Take 200 mg PO BID X 7 days then take 200 mg PO Daily.   apixaban 5 MG Tabs tablet Commonly known as:  ELIQUIS Take 1 tablet (5 mg total) by mouth 2 (two) times daily.   aspirin 81 MG tablet Take 81 mg by mouth daily.   atorvastatin 10 MG tablet Commonly known as:  LIPITOR Take 10 mg  by mouth daily at 6 PM.   budesonide-formoterol 160-4.5 MCG/ACT inhaler Commonly known as:  SYMBICORT Inhale 2 puffs into the lungs 2 (two) times daily.   calcium acetate 667 MG capsule Commonly known as:  PHOSLO Take 1,334 mg by mouth 3 (three) times daily with meals.   carvedilol 6.25 MG tablet Commonly known as:  COREG Take 1 tablet (6.25 mg total) by mouth 2 (two) times daily with a meal.   colchicine 0.6 MG tablet Take 0.6 mg by mouth daily as needed. Reported on 01/06/2016   febuxostat 40  MG tablet Commonly known as:  ULORIC Take 40 mg by mouth daily.   furosemide 20 MG tablet Commonly known as:  LASIX Take 80 mg by mouth daily.   losartan 25 MG tablet Commonly known as:  COZAAR Take 25 mg by mouth daily.   montelukast 10 MG tablet Commonly known as:  SINGULAIR Take 10 mg by mouth at bedtime.   multivitamin Tabs tablet Take 1 tablet by mouth daily.   pantoprazole 40 MG tablet Commonly known as:  PROTONIX Take 40 mg by mouth daily.   Vitamin D3 1000 units Caps Take 2,000 Units by mouth daily.   ZZZQUIL PO Take 25 mg by mouth at bedtime as needed.         DISCHARGE INSTRUCTIONS:   DIET:  Cardiac diet and Renal diet  DISCHARGE CONDITION:  Stable  ACTIVITY:  Activity as tolerated  OXYGEN:  Home Oxygen: No.   Oxygen Delivery: room air  DISCHARGE LOCATION:  home   If you experience worsening of your admission symptoms, develop shortness of breath, life threatening emergency, suicidal or homicidal thoughts you must seek medical attention immediately by calling 911 or calling your MD immediately  if symptoms less severe.  You Must read complete instructions/literature along with all the possible adverse reactions/side effects for all the Medicines you take and that have been prescribed to you. Take any new Medicines after you have completely understood and accpet all the possible adverse reactions/side effects.   Please note  You were cared for by a hospitalist during your hospital stay. If you have any questions about your discharge medications or the care you received while you were in the hospital after you are discharged, you can call the unit and asked to speak with the hospitalist on call if the hospitalist that took care of you is not available. Once you are discharged, your primary care physician will handle any further medical issues. Please note that NO REFILLS for any discharge medications will be authorized once you are discharged, as it  is imperative that you return to your primary care physician (or establish a relationship with a primary care physician if you do not have one) for your aftercare needs so that they can reassess your need for medications and monitor your lab values.     Today   No complaints presently.  Remains in A. Fib/a. flutter but rate controlled.  NO chest pain, shortness of breath.   VITAL SIGNS:  Blood pressure 108/71, pulse (!) 101, temperature 98.2 F (36.8 C), temperature source Oral, resp. rate 19, height 5\' 11"  (1.803 m), weight 112.5 kg (248 lb 1.6 oz), SpO2 95 %.  I/O:   Intake/Output Summary (Last 24 hours) at 06/19/17 1242 Last data filed at 06/19/17 0900  Gross per 24 hour  Intake              710 ml  Output  150 ml  Net              560 ml    PHYSICAL EXAMINATION:   GENERAL:  76 y.o.-year-old patient lying in bed in no acute distress.  EYES: Pupils equal, round, reactive to light and accommodation. No scleral icterus. Extraocular muscles intact.  HEENT: Head atraumatic, normocephalic. Oropharynx and nasopharynx clear.  NECK:  Supple, no jugular venous distention. No thyroid enlargement, no tenderness.  LUNGS: Normal breath sounds bilaterally, no wheezing, rales, rhonchi. No use of accessory muscles of respiration.  CARDIOVASCULAR: S1, S2 normal. No murmurs, rubs, or gallops.  ABDOMEN: Soft, nontender, nondistended. Bowel sounds present. No organomegaly or mass.  EXTREMITIES: No cyanosis, clubbing or edema b/l.    NEUROLOGIC: Cranial nerves II through XII are intact. No focal Motor or sensory deficits b/l.   PSYCHIATRIC: The patient is alert and oriented x 3.  SKIN: No obvious rash, lesion, or ulcer.   Left upper extremity AV fistula with good bruit and good thrill.  DATA REVIEW:   CBC  Recent Labs Lab 06/19/17 0416  WBC 14.2*  HGB 11.9*  HCT 35.2*  PLT 183    Chemistries   Recent Labs Lab 06/18/17 0237  NA 136  K 3.7  CL 95*  CO2 30  GLUCOSE  112*  BUN 28*  CREATININE 4.69*  CALCIUM 8.2*    Cardiac Enzymes  Recent Labs Lab 06/18/17 0237  TROPONINI 0.06*    Microbiology Results  Results for orders placed or performed during the hospital encounter of 06/17/17  MRSA PCR Screening     Status: None   Collection Time: 06/17/17  8:24 PM  Result Value Ref Range Status   MRSA by PCR NEGATIVE NEGATIVE Final    Comment:        The GeneXpert MRSA Assay (FDA approved for NASAL specimens only), is one component of a comprehensive MRSA colonization surveillance program. It is not intended to diagnose MRSA infection nor to guide or monitor treatment for MRSA infections.     RADIOLOGY:  Dg Chest 2 View  Result Date: 06/17/2017 CLINICAL DATA:  Pt had a fast heart rate today at dialysis. Hx of cardiac stents, pneumonia, bronchitis, asthma, hypertension, COPD, prostate cancer. Former smoker EXAM: CHEST  2 VIEW COMPARISON:  11/20/2016 FINDINGS: Cardiac silhouette is normal in size. No mediastinal or hilar masses. No evidence of adenopathy. Lungs are mildly hyperexpanded but clear. No pleural effusion or pneumothorax. There is a mild wedge-shaped compression deformity at the thoracolumbar junction, stable from the prior exam. Bony thorax is demineralized but otherwise unremarkable. IMPRESSION: No active cardiopulmonary disease. Electronically Signed   By: Lajean Manes M.D.   On: 06/17/2017 15:24      Management plans discussed with the patient, family and they are in agreement.  CODE STATUS:     Code Status Orders        Start     Ordered   06/17/17 1851  Full code  Continuous     06/17/17 1850    Code Status History    Date Active Date Inactive Code Status Order ID Comments User Context   08/05/2015  3:25 PM 08/05/2015  6:45 PM Full Code [id number]  Katha Cabal, MD Inpatient   05/20/2015  4:18 PM 05/20/2015  7:37 PM Full Code [id number]  Schnier, Dolores Lory, MD Inpatient    Advance Directive Documentation     Most  Recent Value  Type of Advance Directive  Healthcare Power of [place]  out of facility DNR order (yellow form or pink MOST form)  -  "MOST" Form in Place?  -      TOTAL TIME TAKING CARE OF THIS PATIENT: 40 minutes.    Henreitta Leber M.D on 06/19/2017 at 12:42 PM  Between 7am to 6pm - Pager - [phone]  After 6pm go to [email or web address] - Proofreader  [facility]  Office  [phone]  CC: Primary care physician; Madelyn Brunner, MD

## 2017-06-20 ENCOUNTER — Emergency Department: Payer: Medicare Other

## 2017-06-20 ENCOUNTER — Inpatient Hospital Stay
Admission: EM | Admit: 2017-06-20 | Discharge: 2017-06-22 | DRG: 190 | Disposition: A | Discharge status: Home / Self Care | Payer: Medicare Other | Attending: Internal Medicine | Admitting: Internal Medicine

## 2017-06-20 ENCOUNTER — Encounter: Payer: Self-pay | Admitting: Emergency Medicine

## 2017-06-20 DIAGNOSIS — I4892 Unspecified atrial flutter: Secondary | ICD-10-CM | POA: Diagnosis present

## 2017-06-20 DIAGNOSIS — I5022 Chronic systolic (congestive) heart failure: Secondary | ICD-10-CM | POA: Diagnosis present

## 2017-06-20 DIAGNOSIS — Z7952 Long term (current) use of systemic steroids: Secondary | ICD-10-CM | POA: Diagnosis not present

## 2017-06-20 DIAGNOSIS — Z7982 Long term (current) use of aspirin: Secondary | ICD-10-CM | POA: Diagnosis not present

## 2017-06-20 DIAGNOSIS — J440 Chronic obstructive pulmonary disease with acute lower respiratory infection: Secondary | ICD-10-CM | POA: Diagnosis present

## 2017-06-20 DIAGNOSIS — J189 Pneumonia, unspecified organism: Secondary | ICD-10-CM | POA: Diagnosis present

## 2017-06-20 DIAGNOSIS — N186 End stage renal disease: Secondary | ICD-10-CM | POA: Diagnosis present

## 2017-06-20 DIAGNOSIS — N2581 Secondary hyperparathyroidism of renal origin: Secondary | ICD-10-CM | POA: Diagnosis present

## 2017-06-20 DIAGNOSIS — D631 Anemia in chronic kidney disease: Secondary | ICD-10-CM | POA: Diagnosis present

## 2017-06-20 DIAGNOSIS — R778 Other specified abnormalities of plasma proteins: Secondary | ICD-10-CM

## 2017-06-20 DIAGNOSIS — R7989 Other specified abnormal findings of blood chemistry: Secondary | ICD-10-CM

## 2017-06-20 DIAGNOSIS — Z8546 Personal history of malignant neoplasm of prostate: Secondary | ICD-10-CM | POA: Diagnosis not present

## 2017-06-20 DIAGNOSIS — I482 Chronic atrial fibrillation: Secondary | ICD-10-CM | POA: Diagnosis present

## 2017-06-20 DIAGNOSIS — J9601 Acute respiratory failure with hypoxia: Secondary | ICD-10-CM | POA: Diagnosis present

## 2017-06-20 DIAGNOSIS — J9690 Respiratory failure, unspecified, unspecified whether with hypoxia or hypercapnia: Secondary | ICD-10-CM | POA: Diagnosis present

## 2017-06-20 DIAGNOSIS — E785 Hyperlipidemia, unspecified: Secondary | ICD-10-CM | POA: Diagnosis present

## 2017-06-20 DIAGNOSIS — Y95 Nosocomial condition: Secondary | ICD-10-CM | POA: Diagnosis present

## 2017-06-20 DIAGNOSIS — J441 Chronic obstructive pulmonary disease with (acute) exacerbation: Secondary | ICD-10-CM | POA: Diagnosis present

## 2017-06-20 DIAGNOSIS — Z888 Allergy status to other drugs, medicaments and biological substances status: Secondary | ICD-10-CM | POA: Diagnosis not present

## 2017-06-20 DIAGNOSIS — Z992 Dependence on renal dialysis: Secondary | ICD-10-CM | POA: Diagnosis not present

## 2017-06-20 DIAGNOSIS — Z79899 Other long term (current) drug therapy: Secondary | ICD-10-CM

## 2017-06-20 DIAGNOSIS — R0602 Shortness of breath: Secondary | ICD-10-CM

## 2017-06-20 DIAGNOSIS — I132 Hypertensive heart and chronic kidney disease with heart failure and with stage 5 chronic kidney disease, or end stage renal disease: Secondary | ICD-10-CM | POA: Diagnosis present

## 2017-06-20 DIAGNOSIS — Z881 Allergy status to other antibiotic agents status: Secondary | ICD-10-CM

## 2017-06-20 DIAGNOSIS — K219 Gastro-esophageal reflux disease without esophagitis: Secondary | ICD-10-CM | POA: Diagnosis present

## 2017-06-20 DIAGNOSIS — J449 Chronic obstructive pulmonary disease, unspecified: Secondary | ICD-10-CM | POA: Diagnosis present

## 2017-06-20 LAB — COMPREHENSIVE METABOLIC PANEL
ALBUMIN: 3.8 g/dL (ref 3.5–5.0)
ALK PHOS: 40 U/L (ref 38–126)
ALT: 21 U/L (ref 17–63)
ANION GAP: 12 (ref 5–15)
AST: 19 U/L (ref 15–41)
BILIRUBIN TOTAL: 1.3 mg/dL — AB (ref 0.3–1.2)
BUN: 24 mg/dL — ABNORMAL HIGH (ref 6–20)
CALCIUM: 8.3 mg/dL — AB (ref 8.9–10.3)
CO2: 34 mmol/L — ABNORMAL HIGH (ref 22–32)
Chloride: 90 mmol/L — ABNORMAL LOW (ref 101–111)
Creatinine, Ser: 4.12 mg/dL — ABNORMAL HIGH (ref 0.61–1.24)
GFR calc Af Amer: 15 mL/min — ABNORMAL LOW (ref >60)
GFR, EST NON AFRICAN AMERICAN: 13 mL/min — AB (ref >60)
GLUCOSE: 113 mg/dL — AB (ref 65–99)
Potassium: 3.6 mmol/L (ref 3.5–5.1)
Sodium: 136 mmol/L (ref 135–145)
Total Protein: 7.5 g/dL (ref 6.5–8.1)

## 2017-06-20 LAB — CBC
HCT: 33.7 % — ABNORMAL LOW (ref 40.0–52.0)
Hemoglobin: 11.5 g/dL — ABNORMAL LOW (ref 13.0–18.0)
MCH: 31.1 pg (ref 26.0–34.0)
MCHC: 34.1 g/dL (ref 32.0–36.0)
MCV: 91.1 fL (ref 80.0–100.0)
Platelets: 190 10*3/uL (ref 150–440)
RBC: 3.7 MIL/uL — ABNORMAL LOW (ref 4.40–5.90)
RDW: 14.1 % (ref 11.5–14.5)
WBC: 16 10*3/uL — AB (ref 3.8–10.6)

## 2017-06-20 LAB — BLOOD GAS, ARTERIAL
Acid-Base Excess: 12.9 mmol/L — ABNORMAL HIGH (ref 0.0–2.0)
BICARBONATE: 36.8 mmol/L — AB (ref 20.0–28.0)
Delivery systems: POSITIVE
EXPIRATORY PAP: 5
FIO2: 0.5
Inspiratory PAP: 10
O2 SAT: 99.7 %
PCO2 ART: 43 mmHg (ref 32.0–48.0)
PO2 ART: 179 mmHg — AB (ref 83.0–108.0)
Patient temperature: 37
pH, Arterial: 7.54 — ABNORMAL HIGH (ref 7.350–7.450)

## 2017-06-20 LAB — PROTIME-INR
INR: 1.55
Prothrombin Time: 18.7 seconds — ABNORMAL HIGH (ref 11.4–15.2)

## 2017-06-20 LAB — TROPONIN I
Troponin I: 0.07 ng/mL (ref <0.03)
Troponin I: 0.07 ng/mL (ref <0.03)
Troponin I: 0.06 ng/mL (ref <0.03)

## 2017-06-20 LAB — BRAIN NATRIURETIC PEPTIDE: B NATRIURETIC PEPTIDE 5: 751 pg/mL — AB (ref 0.0–100.0)

## 2017-06-20 LAB — LACTIC ACID, PLASMA: Lactic Acid, Venous: 1.2 mmol/L (ref 0.5–1.9)

## 2017-06-20 MED ORDER — IPRATROPIUM-ALBUTEROL 0.5-2.5 (3) MG/3ML IN SOLN
3.0000 mL | RESPIRATORY_TRACT | Status: DC
  Administered 2017-06-20 – 2017-06-21 (×5): 3 mL via RESPIRATORY_TRACT
  Filled 2017-06-20 (×5): qty 3

## 2017-06-20 MED ORDER — SODIUM CHLORIDE 0.9 % IV BOLUS (SEPSIS)
500.0000 mL | Freq: Once | INTRAVENOUS | Status: AC
  Administered 2017-06-20: 500 mL via INTRAVENOUS

## 2017-06-20 MED ORDER — ORAL CARE MOUTH RINSE
15.0000 mL | Freq: Two times a day (BID) | OROMUCOSAL | Status: DC
  Administered 2017-06-20 – 2017-06-21 (×3): 15 mL via OROMUCOSAL

## 2017-06-20 MED ORDER — ACETAMINOPHEN 325 MG PO TABS
650.0000 mg | ORAL_TABLET | Freq: Once | ORAL | Status: AC
  Administered 2017-06-20: 650 mg via ORAL
  Filled 2017-06-20: qty 2

## 2017-06-20 MED ORDER — CARVEDILOL 6.25 MG PO TABS
6.2500 mg | ORAL_TABLET | Freq: Two times a day (BID) | ORAL | Status: DC
  Administered 2017-06-20 – 2017-06-21 (×3): 6.25 mg via ORAL
  Filled 2017-06-20 (×3): qty 1

## 2017-06-20 MED ORDER — VANCOMYCIN HCL 10 G IV SOLR
2000.0000 mg | INTRAVENOUS | Status: AC
  Administered 2017-06-20: 2000 mg via INTRAVENOUS
  Filled 2017-06-20: qty 2000

## 2017-06-20 MED ORDER — MOMETASONE FURO-FORMOTEROL FUM 200-5 MCG/ACT IN AERO
2.0000 | INHALATION_SPRAY | Freq: Two times a day (BID) | RESPIRATORY_TRACT | Status: DC
  Administered 2017-06-20 – 2017-06-21 (×3): 2 via RESPIRATORY_TRACT
  Filled 2017-06-20 (×2): qty 8.8

## 2017-06-20 MED ORDER — CEFEPIME-DEXTROSE 1 GM/50ML IV SOLR
1.0000 g | INTRAVENOUS | Status: AC
  Administered 2017-06-20: 1 g via INTRAVENOUS
  Filled 2017-06-20: qty 50

## 2017-06-20 MED ORDER — CEFEPIME-DEXTROSE 1 GM/50ML IV SOLR: 1.0000 g | INTRAVENOUS | Status: DC

## 2017-06-20 MED ORDER — AMIODARONE HCL 200 MG PO TABS
200.0000 mg | ORAL_TABLET | Freq: Every day | ORAL | Status: DC
  Administered 2017-06-20 – 2017-06-21 (×2): 200 mg via ORAL
  Filled 2017-06-20 (×2): qty 1

## 2017-06-20 MED ORDER — ACETAMINOPHEN 650 MG RE SUPP: 650.0000 mg | Freq: Four times a day (QID) | RECTAL | Status: DC | PRN

## 2017-06-20 MED ORDER — HYDROCODONE-ACETAMINOPHEN 5-325 MG PO TABS: 1.0000 | ORAL_TABLET | ORAL | Status: DC | PRN

## 2017-06-20 MED ORDER — RENA-VITE PO TABS
1.0000 | ORAL_TABLET | Freq: Every day | ORAL | Status: DC
  Administered 2017-06-20 – 2017-06-21 (×2): 1 via ORAL
  Filled 2017-06-20 (×2): qty 1

## 2017-06-20 MED ORDER — CEFEPIME-DEXTROSE 1 GM/50ML IV SOLR
1.0000 g | INTRAVENOUS | Status: DC
  Filled 2017-06-20: qty 50

## 2017-06-20 MED ORDER — ALBUTEROL SULFATE (2.5 MG/3ML) 0.083% IN NEBU: 2.5000 mg | INHALATION_SOLUTION | RESPIRATORY_TRACT | Status: DC | PRN

## 2017-06-20 MED ORDER — SENNOSIDES-DOCUSATE SODIUM 8.6-50 MG PO TABS: 1.0000 | ORAL_TABLET | Freq: Every evening | ORAL | Status: DC | PRN

## 2017-06-20 MED ORDER — ACETAMINOPHEN 325 MG PO TABS: 650.0000 mg | ORAL_TABLET | Freq: Four times a day (QID) | ORAL | Status: DC | PRN

## 2017-06-20 MED ORDER — ALBUTEROL SULFATE HFA 108 (90 BASE) MCG/ACT IN AERS: 2.0000 | INHALATION_SPRAY | RESPIRATORY_TRACT | Status: DC | PRN

## 2017-06-20 MED ORDER — METHYLPREDNISOLONE SODIUM SUCC 125 MG IJ SOLR
125.0000 mg | Freq: Once | INTRAMUSCULAR | Status: AC
  Administered 2017-06-20: 125 mg via INTRAVENOUS
  Filled 2017-06-20: qty 2

## 2017-06-20 MED ORDER — ALBUTEROL SULFATE (2.5 MG/3ML) 0.083% IN NEBU
5.0000 mg | INHALATION_SOLUTION | Freq: Once | RESPIRATORY_TRACT | Status: AC
  Administered 2017-06-20: 5 mg via RESPIRATORY_TRACT
  Filled 2017-06-20: qty 6

## 2017-06-20 MED ORDER — VANCOMYCIN HCL IN DEXTROSE 1-5 GM/200ML-% IV SOLN: 1000.0000 mg | INTRAVENOUS | Status: DC

## 2017-06-20 MED ORDER — MONTELUKAST SODIUM 10 MG PO TABS
10.0000 mg | ORAL_TABLET | Freq: Every day | ORAL | Status: DC
  Administered 2017-06-20 – 2017-06-21 (×2): 10 mg via ORAL
  Filled 2017-06-20 (×2): qty 1

## 2017-06-20 MED ORDER — ATORVASTATIN CALCIUM 10 MG PO TABS
10.0000 mg | ORAL_TABLET | Freq: Every day | ORAL | Status: DC
  Administered 2017-06-20 – 2017-06-21 (×2): 10 mg via ORAL
  Filled 2017-06-20 (×2): qty 1

## 2017-06-20 MED ORDER — METHYLPREDNISOLONE SODIUM SUCC 40 MG IJ SOLR
40.0000 mg | Freq: Three times a day (TID) | INTRAMUSCULAR | Status: DC
  Administered 2017-06-20 – 2017-06-22 (×5): 40 mg via INTRAVENOUS
  Filled 2017-06-20 (×5): qty 1

## 2017-06-20 MED ORDER — DIPHENHYDRAMINE HCL 25 MG PO CAPS
25.0000 mg | ORAL_CAPSULE | Freq: Four times a day (QID) | ORAL | Status: DC | PRN
  Administered 2017-06-20: 25 mg via ORAL
  Filled 2017-06-20: qty 1

## 2017-06-20 MED ORDER — VITAMIN D 1000 UNITS PO TABS
2000.0000 [IU] | ORAL_TABLET | Freq: Every day | ORAL | Status: DC
  Administered 2017-06-20 – 2017-06-21 (×2): 2000 [IU] via ORAL
  Filled 2017-06-20 (×2): qty 2

## 2017-06-20 MED ORDER — FEBUXOSTAT 40 MG PO TABS
40.0000 mg | ORAL_TABLET | Freq: Every day | ORAL | Status: DC
  Administered 2017-06-21: 40 mg via ORAL
  Filled 2017-06-20 (×3): qty 1

## 2017-06-20 MED ORDER — FUROSEMIDE 40 MG PO TABS
80.0000 mg | ORAL_TABLET | Freq: Every day | ORAL | Status: DC
  Administered 2017-06-21: 80 mg via ORAL
  Filled 2017-06-20: qty 2

## 2017-06-20 MED ORDER — CALCIUM ACETATE (PHOS BINDER) 667 MG PO CAPS
1334.0000 mg | ORAL_CAPSULE | Freq: Three times a day (TID) | ORAL | Status: DC
  Administered 2017-06-20 – 2017-06-22 (×5): 1334 mg via ORAL
  Filled 2017-06-20 (×5): qty 2

## 2017-06-20 MED ORDER — PANTOPRAZOLE SODIUM 40 MG PO TBEC
40.0000 mg | DELAYED_RELEASE_TABLET | Freq: Every day | ORAL | Status: DC
  Administered 2017-06-20 – 2017-06-21 (×2): 40 mg via ORAL
  Filled 2017-06-20 (×2): qty 1

## 2017-06-20 MED ORDER — ASPIRIN EC 81 MG PO TBEC: 81.0000 mg | DELAYED_RELEASE_TABLET | Freq: Every day | ORAL | Status: DC

## 2017-06-20 MED ORDER — ONDANSETRON HCL 4 MG/2ML IJ SOLN: 4.0000 mg | Freq: Four times a day (QID) | INTRAMUSCULAR | Status: DC | PRN

## 2017-06-20 MED ORDER — ONDANSETRON HCL 4 MG PO TABS: 4.0000 mg | ORAL_TABLET | Freq: Four times a day (QID) | ORAL | Status: DC | PRN

## 2017-06-20 MED ORDER — APIXABAN 5 MG PO TABS
5.0000 mg | ORAL_TABLET | Freq: Two times a day (BID) | ORAL | Status: DC
  Administered 2017-06-20 – 2017-06-21 (×3): 5 mg via ORAL
  Filled 2017-06-20 (×3): qty 1

## 2017-06-20 MED ORDER — LOSARTAN POTASSIUM 25 MG PO TABS
25.0000 mg | ORAL_TABLET | Freq: Every day | ORAL | Status: DC
  Administered 2017-06-20 – 2017-06-21 (×2): 25 mg via ORAL
  Filled 2017-06-20 (×2): qty 1

## 2017-06-20 NOTE — H&P (Signed)
[facility] [place] NAME: Maurice Peterson    MR#:  [id number]  DATE OF BIRTH:  09-Jan-1941  DATE OF ADMISSION:  06/20/2017  PRIMARY CARE PHYSICIAN: Madelyn Brunner, MD   REQUESTING/REFERRING PHYSICIAN: dr Burlene Arnt  CHIEF COMPLAINT:   sob HISTORY OF PRESENT ILLNESS:  Maurice Peterson  is a 76 y.o. male with a known history of ESRD on hemodialysis, COPD, chronic systolic heart failure ejection fraction 35% and recent discharge from the hospital with atrial flutter who presents today with shortness of breath. Patient reports over the past 2 days he has had shortness of breath, cough, fever and wheezing. He went to dialysis today and took his whole session however he continued to have shortness of breath and wheezing so was brought to the ER via EMS for further evaluation.  In the emergency room he is treated for COPD exacerbation and HCAP with cefepime and vancomycin. He is placed on BiPAP due to increased work of breathing.  PAST MEDICAL HISTORY:   Past Medical History:  Diagnosis Date  . Arthritis   . Chronic kidney disease    requires dialysis  . COPD (chronic obstructive pulmonary disease) ([place])   . GERD (gastroesophageal reflux disease)   . Hyperlipidemia   . Hypertension   . Prostate cancer ([place])   . Shortness of breath dyspnea     PAST SURGICAL HISTORY:   Past Surgical History:  Procedure Laterality Date  . CATARACT EXTRACTION W/PHACO Right 08/20/2015   Procedure: CATARACT EXTRACTION PHACO AND INTRAOCULAR LENS PLACEMENT ([place]);  Surgeon: Leandrew Koyanagi, MD;  Location: [facility];  Service: Ophthalmology;  Laterality: Right;  . CATARACT EXTRACTION W/PHACO Left 09/10/2015   Procedure: CATARACT EXTRACTION PHACO AND INTRAOCULAR LENS PLACEMENT ([facility]);  Surgeon: Leandrew Koyanagi, MD;  Location: [facility];  Service: Ophthalmology;  Laterality: Left;  . CORONARY STENT PLACEMENT    . KIDNEY SURGERY Right    tumor removed  from kidney  . PERIPHERAL VASCULAR CATHETERIZATION Left 05/20/2015   Procedure: A/V Shuntogram/Fistulagram;  Surgeon: Katha Cabal, MD;  Location: [facility];  Service: Cardiovascular;  Laterality: Left;  . PERIPHERAL VASCULAR CATHETERIZATION N/A 05/20/2015   Procedure: A/V Shunt Intervention;  Surgeon: Katha Cabal, MD;  Location: [facility];  Service: Cardiovascular;  Laterality: N/A;  . PERIPHERAL VASCULAR CATHETERIZATION Left 08/05/2015   Procedure: A/V Shuntogram/Fistulagram;  Surgeon: Katha Cabal, MD;  Location: [facility];  Service: Cardiovascular;  Laterality: Left;  . PERIPHERAL VASCULAR CATHETERIZATION N/A 08/05/2015   Procedure: A/V Shunt Intervention;  Surgeon: Katha Cabal, MD;  Location: [facility];  Service: Cardiovascular;  Laterality: N/A;  . PERIPHERAL VASCULAR CATHETERIZATION Left 01/06/2016   Procedure: A/V Shuntogram/Fistulagram;  Surgeon: Katha Cabal, MD;  Location: [facility];  Service: Cardiovascular;  Laterality: Left;  . PERIPHERAL VASCULAR CATHETERIZATION N/A 01/06/2016   Procedure: A/V Shunt Intervention;  Surgeon: Katha Cabal, MD;  Location: [facility];  Service: Cardiovascular;  Laterality: N/A;  . PERIPHERAL VASCULAR CATHETERIZATION N/A 12/13/2016   Procedure: Dialysis/Perma Catheter Removal;  Surgeon: Algernon Huxley, MD;  Location: [facility];  Service: Cardiovascular;  Laterality: N/A;    SOCIAL HISTORY:   Social History  Substance Use Topics  . Smoking status: Former Smoker    Packs/day: 1.00    Years: 30.00    Types: Cigarettes    Quit date: 05/19/2009  . Smokeless tobacco: Never Used  .  Alcohol use No    FAMILY HISTORY:   Family History  Problem Relation Age of Onset  . Liver disease Mother   . Heart disease Mother   . Hypertension Mother   . Pancreatic cancer Father     DRUG ALLERGIES:   Allergies  Allergen Reactions  . Ace Inhibitors Nausea And  Vomiting  . Amoxicillin-Pot Clavulanate Nausea And Vomiting  . Other Nausea And Vomiting    Anti-inflammatories    REVIEW OF SYSTEMS:   Review of Systems  Constitutional: Negative.  Negative for chills, fever and malaise/fatigue.  HENT: Negative.  Negative for ear discharge, ear pain, hearing loss, nosebleeds and sore throat.   Eyes: Negative.  Negative for blurred vision and pain.  Respiratory: Positive for cough, shortness of breath and wheezing. Negative for hemoptysis.   Cardiovascular: Positive for orthopnea and leg swelling. Negative for chest pain and palpitations.  Gastrointestinal: Negative.  Negative for abdominal pain, blood in stool, diarrhea, nausea and vomiting.  Genitourinary: Negative.  Negative for dysuria.  Musculoskeletal: Negative.  Negative for back pain.  Skin: Negative.   Neurological: Negative for dizziness, tremors, speech change, focal weakness, seizures and headaches.  Endo/Heme/Allergies: Negative.  Does not bruise/bleed easily.  Psychiatric/Behavioral: Negative.  Negative for depression, hallucinations and suicidal ideas.    MEDICATIONS AT HOME:   Prior to Admission medications   Medication Sig Start Date End Date Taking? Authorizing Provider  amiodarone (PACERONE) 200 MG tablet Take 200 mg PO BID X 7 days then take 200 mg PO Daily. 06/19/17  Yes Sainani, Belia Heman, MD  apixaban (ELIQUIS) 5 MG TABS tablet Take 1 tablet (5 mg total) by mouth 2 (two) times daily. 06/19/17  Yes Henreitta Leber, MD  aspirin 81 MG tablet Take 81 mg by mouth daily.   Yes [provider]  atorvastatin (LIPITOR) 10 MG tablet Take 10 mg by mouth daily at 6 PM.    Yes [provider]  budesonide-formoterol (SYMBICORT) 160-4.5 MCG/ACT inhaler Inhale 2 puffs into the lungs 2 (two) times daily.   Yes [provider]  calcium acetate (PHOSLO) 667 MG capsule Take 1,334 mg by mouth 3 (three) times daily with meals.   Yes [provider]  carvedilol  (COREG) 6.25 MG tablet Take 1 tablet (6.25 mg total) by mouth 2 (two) times daily with a meal. 06/19/17  Yes Sainani, Belia Heman, MD  Cholecalciferol (VITAMIN D3) 1000 UNITS CAPS Take 2,000 Units by mouth daily.    Yes [provider]  febuxostat (ULORIC) 40 MG tablet Take 40 mg by mouth daily.    Yes [provider]  furosemide (LASIX) 20 MG tablet Take 80 mg by mouth daily.    Yes [provider]  losartan (COZAAR) 25 MG tablet Take 25 mg by mouth daily.   Yes [provider]  montelukast (SINGULAIR) 10 MG tablet Take 10 mg by mouth at bedtime.   Yes [provider]  multivitamin (RENA-VIT) TABS tablet Take 1 tablet by mouth daily.   Yes [provider]  pantoprazole (PROTONIX) 40 MG tablet Take 40 mg by mouth daily.   Yes [provider]  albuterol (PROVENTIL HFA;VENTOLIN HFA) 108 (90 BASE) MCG/ACT inhaler Inhale 2 puffs into the lungs every 4 (four) hours as needed for wheezing or shortness of breath.    [provider]  colchicine 0.6 MG tablet Take 0.6 mg by mouth daily as needed. Reported on 01/06/2016    [provider]  DiphenhydrAMINE HCl, Sleep, (ZZZQUIL  PO) Take 25 mg by mouth at bedtime as needed.    [provider]      VITAL SIGNS:  Blood pressure 132/79, pulse (!) 132, temperature 98.4 F (36.9 C), temperature source Oral, resp. rate 20, height 5\' 11"  (1.803 m), weight 108.9 kg (240 lb), SpO2 94 %.  PHYSICAL EXAMINATION:   Physical Exam  Constitutional: He is oriented to person, place, and time and well-developed, well-nourished, and in no distress. No distress.  HENT:  Head: Normocephalic.  Eyes: No scleral icterus.  Neck: Normal range of motion. Neck supple. No JVD present. No tracheal deviation present.  Cardiovascular: Normal rate, regular rhythm and normal heart sounds.  Exam reveals no gallop and no friction rub.   No murmur heard. Tachycardia  Pulmonary/Chest: No respiratory  distress. He has no wheezes. He has no rales. He exhibits no tenderness.  Increased effort with bilateral wheezing and fair air movement  Abdominal: Soft. Bowel sounds are normal. He exhibits no distension and no mass. There is no tenderness. There is no rebound and no guarding.  Musculoskeletal: Normal range of motion. He exhibits edema.  Neurological: He is alert and oriented to person, place, and time.  Skin: Skin is warm. No rash noted. No erythema.  Psychiatric: Affect and judgment normal.      LABORATORY PANEL:   CBC  Recent Labs Lab 06/19/17 0416  WBC 14.2*  HGB 11.9*  HCT 35.2*  PLT 183   ------------------------------------------------------------------------------------------------------------------  Chemistries   Recent Labs Lab 06/18/17 0237  NA 136  K 3.7  CL 95*  CO2 30  GLUCOSE 112*  BUN 28*  CREATININE 4.69*  CALCIUM 8.2*   ------------------------------------------------------------------------------------------------------------------  Cardiac Enzymes  Recent Labs Lab 06/18/17 0237  TROPONINI 0.06*   ------------------------------------------------------------------------------------------------------------------  RADIOLOGY:  Dg Chest Port 1 View  Result Date: 06/20/2017 CLINICAL DATA:  Inspiratory chest pain began yesterday. Recently hospitalized for rapid atrial fibrillation. History of COPD, hypertension, chronic renal insufficiency, discontinued smoking in 2010. EXAM: PORTABLE CHEST 1 VIEW COMPARISON:  PA and lateral chest x-ray of June 17, 2017 FINDINGS: The lungs are adequately inflated. There is no focal infiltrate. The interstitial markings are coarse. The pulmonary vascularity is not engorged. The cardiac silhouette is enlarged but stable. The bony thorax exhibits no acute abnormality. IMPRESSION: COPD with chronic interstitial changes. No alveolar pneumonia, pneumothorax, pleural effusion, or pulmonary edema. Electronically Signed   By:  David  Martinique M.D.   On: 06/20/2017 11:51    EKG:   Sinus tachycardia No st elevation or depression  IMPRESSION AND PLAN:   76 year old male with history of chronic systolic heart failure-fraction 35%, ESRD on hemodialysis and recent hospitalization for atrial flutter who presents with shortness of breath.  1. Acute hypoxic respiratory failure in the setting of COPD exacerbation and HCAP. Chest x-ray is not conclusive for pneumonia however patient's clinical symptoms are suggestive of pneumonia. Patient is on BiPAP and will need ICU care. Case discussed with Dr. Mortimer Fries.  2. Acute exacerbation COPD: Continue IV steroids, nebulizers and inhalers.  3. Sepsis due to HCAP: She presents with leukocytosis, increased respiratory rate, tachycardia  Sepsis is due to pneumonia  Continue cefepime and vancomycin Would avoid fluids due to systolic heart failure and ESRD  4. ESRD on hemodialysis: Nephrology consultation for dialysis on Wednseday.  5. Chronic systolic heart failure-fraction 35%: No signs of exacerbation  6. Hyperlipidemia: Continue statin      All the records are reviewed and case discussed with ED provider. Management plans discussed  with the patient and he is in agreement  CODE STATUS: FULL  CRITICAL CARE TOTAL TIME TAKING CARE OF THIS PATIENT: 50 minutes.    Benito Lemmerman M.D on 06/20/2017 at 12:11 PM  Between 7am to 6pm - Pager - [phone]  After 6pm go to [email or web address] - password [email or web address] [facility]  Office  [phone]  CC: Primary care physician; Madelyn Brunner, MD

## 2017-06-20 NOTE — Progress Notes (Signed)
Called Dr. Ara Kussmaul regarding sleep medication per patient request.  Appropriate orders placed.  Christene Slates  06/20/2017  9:05 PM

## 2017-06-20 NOTE — Progress Notes (Signed)
[facility]  ROUNDING NOTE   Subjective:  Patient known to [place] from prior admissions.  Came in this weekend for arrhythmia.   Was discharged on Sunday. Today he went to dialysis, UF achieved was 2.4kg per his report.  Then had worsening shortness of breath and came here.  Was initially on bipap, transitioned to [place] now.    Objective:  Vital signs in last 24 hours:  Temp:  [98.1 F (36.7 C)-98.4 F (36.9 C)] 98.2 F (36.8 C) (06/25 1654) Pulse Rate:  [74-132] 90 (06/25 1654) Resp:  [18-34] 26 (06/25 1615) BP: (101-132)/(62-88) 128/75 (06/25 1654) SpO2:  [62 %-100 %] 99 % (06/25 1654) Weight:  [108.9 kg (240 lb)] 108.9 kg (240 lb) (06/25 1029)  Weight change:  Filed Weights   06/20/17 1029  Weight: 108.9 kg (240 lb)    Intake/Output: No intake/output data recorded.   Intake/Output this shift:  Total I/O In: 300 [IV Piggyback:300] Out: -   Physical Exam: General: No acute distress  Head: Normocephalic, atraumatic. Moist oral mucosal membranes  Eyes: Anicteric  Neck: Supple, trachea midline  Lungs:  Mild bilateral wheezing, normal work of breathing  Heart: S1S2 no rubs  Abdomen:  Soft, nontender, bowel sounds present  Extremities: 1+ peripheral edema.  Neurologic: Awake, alert, following commands  Skin: No lesions  Access: LUE AVF    Basic Metabolic Panel:  Recent Labs Lab 06/17/17 1430 06/18/17 0237 06/20/17 1202  NA 136 136 136  K 3.7 3.7 3.6  CL 94* 95* 90*  CO2 31 30 34*  GLUCOSE 113* 112* 113*  BUN 20 28* 24*  CREATININE 3.72* 4.69* 4.12*  CALCIUM 8.3* 8.2* 8.3*    Liver Function Tests:  Recent Labs Lab 06/20/17 1202  AST 19  ALT 21  ALKPHOS 40  BILITOT 1.3*  PROT 7.5  ALBUMIN 3.8   No results for input(s): LIPASE, AMYLASE in the last 168 hours. No results for input(s): AMMONIA in the last 168 hours.  CBC:  Recent Labs Lab 06/17/17 1430 06/18/17 0237 06/19/17 0416 06/20/17 1202  WBC 13.4* 13.7* 14.2* 16.0*  HGB  12.6* 11.9* 11.9* 11.5*  HCT 37.6* 35.3* 35.2* 33.7*  MCV 93.7 93.4 93.7 91.1  PLT 209 190 183 190    Cardiac Enzymes:  Recent Labs Lab 06/17/17 1430 06/17/17 1907 06/18/17 0237 06/20/17 1202  TROPONINI 0.05* 0.06* 0.06* 0.07*    BNP: Invalid input(s): POCBNP  CBG: No results for input(s): GLUCAP in the last 168 hours.  Microbiology: Results for orders placed or performed during the hospital encounter of 06/17/17  MRSA PCR Screening     Status: None   Collection Time: 06/17/17  8:24 PM  Result Value Ref Range Status   MRSA by PCR NEGATIVE NEGATIVE Final    Comment:        The GeneXpert MRSA Assay (FDA approved for NASAL specimens only), is one component of a comprehensive MRSA colonization surveillance program. It is not intended to diagnose MRSA infection nor to guide or monitor treatment for MRSA infections.     Coagulation Studies:  Recent Labs  06/20/17 1216  LABPROT 18.7*  INR 1.55    Urinalysis: No results for input(s): COLORURINE, LABSPEC, PHURINE, GLUCOSEU, HGBUR, BILIRUBINUR, KETONESUR, PROTEINUR, UROBILINOGEN, NITRITE, LEUKOCYTESUR in the last 72 hours.  Invalid input(s): APPERANCEUR    Imaging: Dg Chest Port 1 View  Result Date: 06/20/2017 CLINICAL DATA:  Inspiratory chest pain began yesterday. Recently hospitalized for rapid atrial fibrillation. History of COPD, hypertension, chronic renal  insufficiency, discontinued smoking in 2010. EXAM: PORTABLE CHEST 1 VIEW COMPARISON:  PA and lateral chest x-ray of June 17, 2017 FINDINGS: The lungs are adequately inflated. There is no focal infiltrate. The interstitial markings are coarse. The pulmonary vascularity is not engorged. The cardiac silhouette is enlarged but stable. The bony thorax exhibits no acute abnormality. IMPRESSION: COPD with chronic interstitial changes. No alveolar pneumonia, pneumothorax, pleural effusion, or pulmonary edema. Electronically Signed   By: David  Martinique M.D.   On:  06/20/2017 11:51     Medications:   . [START ON 06/21/2017] ceFEPime (MAXIPIME) IV    . [START ON 06/22/2017] vancomycin     . amiodarone  200 mg Oral Daily  . apixaban  5 mg Oral BID  . aspirin EC  81 mg Oral Daily  . atorvastatin  10 mg Oral q1800  . calcium acetate  1,334 mg Oral TID WC  . carvedilol  6.25 mg Oral BID WC  . cholecalciferol  2,000 Units Oral Daily  . febuxostat  40 mg Oral Daily  . furosemide  80 mg Oral Daily  . ipratropium-albuterol  3 mL Nebulization Q4H  . losartan  25 mg Oral Daily  . mouth rinse  15 mL Mouth Rinse BID  . methylPREDNISolone (SOLU-MEDROL) injection  40 mg Intravenous Q8H  . mometasone-formoterol  2 puff Inhalation BID  . montelukast  10 mg Oral QHS  . multivitamin  1 tablet Oral Daily  . pantoprazole  40 mg Oral Daily   acetaminophen **OR** acetaminophen, albuterol, HYDROcodone-acetaminophen, ondansetron **OR** ondansetron (ZOFRAN) IV, senna-docusate  Assessment/ Plan:  76 y.o. male ESRD on HD MWF followed by [facility] nephrology, COPD, GERD, hypertension, hyperlipidemia, prostate cancer, COPD, chronic systolic heart failure ejection fraction 35% presents now with shortness of breath and acute respiratory failure.  [facility]/MWF/[address]  1. ESRD on HD MWF:  Patient did complete hemodialysis today. Ultrafiltration achieved was 2.4 kg. Therefore no urgent indication for another dialysis session today. We will reassess the patient for dialysis tomorrow.  2. Acute respiratory failure. Felt to be secondary to COPD exacerbation and healthcare associated pneumonia. Treatment as per hospitalist. He was taken off of BiPAP and transitioned to nasal cannula.  3.  Anemia of chronic kidney disease. Hemoglobin currently 11.5. Hold off on Epogen.  4. Secondary hyperparathyroidism. Check intact PTH and phosphorus with next dialysis treatment.  5. Thanks for consultation.   LOS: 0 Maeola Mchaney 6/25/20185:11 PM

## 2017-06-20 NOTE — ED Provider Notes (Signed)
[facility] Emergency Department Provider Note  ____________________________________________   I have reviewed the triage vital signs and the nursing notes.   HISTORY  Chief Complaint Tachycardia and Chest Pain    HPI Maurice Peterson is a 76 y.o. male with a chronic history of dialysis, arthritis, COPD, not on home oxygen, reflux, hypertension, atrial flutter with a recent admission, on blood thinners,did have his dialysis completely today. However has been having cough and wheeze since yesterday. No fever at home but did have a fever at dialysis which was low-grade. No vomiting. States that it's uncomfortable to cough. No other chest pain. Denies any leg swelling. No history of PE or DVT. Patient is on a eliquis.  He states he started feeling bad 2 days ago with cough and he felt like his albuterol was not helping him. He has had some "mucus in his throat. He feels generally weak. They did take off 2 L on dialysis.    Past Medical History:  Diagnosis Date  . Arthritis   . Chronic kidney disease    requires dialysis  . COPD (chronic obstructive pulmonary disease) ([place])   . GERD (gastroesophageal reflux disease)   . Hyperlipidemia   . Hypertension   . Prostate cancer ([place])   . Shortness of breath dyspnea     Patient Active Problem List   Diagnosis Date Noted  . Respiratory failure ([place]) 06/20/2017  . Atrial flutter with rapid ventricular response ([place]) 06/17/2017  . End stage renal disease ([place]) 11/08/2016  . Complication of vascular access for dialysis 11/08/2016  . Essential hypertension, benign 11/08/2016  . Hypercholesterolemia 11/08/2016    Past Surgical History:  Procedure Laterality Date  . CATARACT EXTRACTION W/PHACO Right 08/20/2015   Procedure: CATARACT EXTRACTION PHACO AND INTRAOCULAR LENS PLACEMENT ([place]);  Surgeon: Leandrew Koyanagi, MD;  Location: [place];  Service: Ophthalmology;  Laterality: Right;  . CATARACT EXTRACTION  W/PHACO Left 09/10/2015   Procedure: CATARACT EXTRACTION PHACO AND INTRAOCULAR LENS PLACEMENT ([facility]);  Surgeon: Leandrew Koyanagi, MD;  Location: [place];  Service: Ophthalmology;  Laterality: Left;  . CORONARY STENT PLACEMENT    . 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 PERIPHERAL VASCULAR CATHETERIZATION Left 05/20/2015   Procedure: A/V Shuntogram/Fistulagram;  Surgeon: Katha Cabal, MD;  Location: [place] [facility];  Service: Cardiovascular;  Laterality: Left;  . PERIPHERAL VASCULAR CATHETERIZATION N/A 05/20/2015   Procedure: A/V Shunt Intervention;  Surgeon: Katha Cabal, MD;  Location: [place] [facility];  Service: Cardiovascular;  Laterality: N/A;  . PERIPHERAL VASCULAR CATHETERIZATION Left 08/05/2015   Procedure: A/V Shuntogram/Fistulagram;  Surgeon: Katha Cabal, MD;  Location: [place] [facility];  Service: Cardiovascular;  Laterality: Left;  . PERIPHERAL VASCULAR CATHETERIZATION N/A 08/05/2015   Procedure: A/V Shunt Intervention;  Surgeon: Katha Cabal, MD;  Location: [place] [facility];  Service: Cardiovascular;  Laterality: N/A;  . PERIPHERAL VASCULAR CATHETERIZATION Left 01/06/2016   Procedure: A/V Shuntogram/Fistulagram;  Surgeon: Katha Cabal, MD;  Location: [place] [facility];  Service: Cardiovascular;  Laterality: Left;  . PERIPHERAL VASCULAR CATHETERIZATION N/A 01/06/2016   Procedure: A/V Shunt Intervention;  Surgeon: Katha Cabal, MD;  Location: [place] [facility];  Service: Cardiovascular;  Laterality: N/A;  . PERIPHERAL VASCULAR CATHETERIZATION N/A 12/13/2016   Procedure: Dialysis/Perma Catheter Removal;  Surgeon: Algernon Huxley, MD;  Location: [place] [facility];  Service: Cardiovascular;  Laterality: N/A;    Prior to Admission medications  Medication Sig Start Date End Date Taking? Authorizing Provider  amiodarone (PACERONE) 200 MG tablet Take 200 mg PO BID X 7 days then take 200 mg PO Daily. 06/19/17  Yes  Sainani, Belia Heman, MD  apixaban (ELIQUIS) 5 MG TABS tablet Take 1 tablet (5 mg total) by mouth 2 (two) times daily. 06/19/17  Yes Henreitta Leber, MD  aspirin 81 MG tablet Take 81 mg by mouth daily.   Yes [provider]  atorvastatin (LIPITOR) 10 MG tablet Take 10 mg by mouth daily at 6 PM.    Yes [provider]  budesonide-formoterol (SYMBICORT) 160-4.5 MCG/ACT inhaler Inhale 2 puffs into the lungs 2 (two) times daily.   Yes [provider]  calcium acetate (PHOSLO) 667 MG capsule Take 1,334 mg by mouth 3 (three) times daily with meals.   Yes [provider]  carvedilol (COREG) 6.25 MG tablet Take 1 tablet (6.25 mg total) by mouth 2 (two) times daily with a meal. 06/19/17  Yes Sainani, Belia Heman, MD  Cholecalciferol (VITAMIN D3) 1000 UNITS CAPS Take 2,000 Units by mouth daily.    Yes [provider]  febuxostat (ULORIC) 40 MG tablet Take 40 mg by mouth daily.    Yes [provider]  furosemide (LASIX) 20 MG tablet Take 80 mg by mouth daily.    Yes [provider]  losartan (COZAAR) 25 MG tablet Take 25 mg by mouth daily.   Yes [provider]  montelukast (SINGULAIR) 10 MG tablet Take 10 mg by mouth at bedtime.   Yes [provider]  multivitamin (RENA-VIT) TABS tablet Take 1 tablet by mouth daily.   Yes [provider]  pantoprazole (PROTONIX) 40 MG tablet Take 40 mg by mouth daily.   Yes [provider]  albuterol (PROVENTIL HFA;VENTOLIN HFA) 108 (90 BASE) MCG/ACT inhaler Inhale 2 puffs into the lungs every 4 (four) hours as needed for wheezing or shortness of breath.    [provider]  colchicine 0.6 MG tablet Take 0.6 mg by mouth daily as needed. Reported on 01/06/2016    [provider]  DiphenhydrAMINE HCl, Sleep, (ZZZQUIL PO) Take 25 mg by mouth at bedtime as needed.    [provider]    Allergies Ace inhibitors; Amoxicillin-pot clavulanate; and Other  Family  History  Problem Relation Age of Onset  . Liver disease Mother   . Heart disease Mother   . Hypertension Mother   . Pancreatic cancer Father     Social History Social History  Substance Use Topics  . Smoking status: Former Smoker    Packs/day: 1.00    Years: 30.00    Types: Cigarettes    Quit date: 05/19/2009  . Smokeless tobacco: Never Used  . Alcohol use No    Review of Systems Constitutional: Positive fever/chills Eyes: No visual changes. ENT: No sore throat. No stiff neck no neck pain Cardiovascular: Denies chest pain. Except when coughing Respiratory: Positive shortness of breath. Gastrointestinal:   no vomiting.  No diarrhea.  No constipation. Genitourinary: Negative for dysuria. Musculoskeletal: Negative lower extremity swelling Skin: Negative for rash. Neurological: Negative for severe headaches, focal weakness or numbness.   ____________________________________________   PHYSICAL EXAM:  VITAL SIGNS: ED Triage Vitals [06/20/17 1029]  Enc Vitals Group     BP 132/79     Pulse Rate (!) 132     Resp 20     Temp 98.4 F (36.9 C)     Temp Source Oral  SpO2 100 %     Weight 240 lb (108.9 kg)     Height 5\' 11"  (1.803 m)     Head Circumference      Peak Flow      Pain Score 8     Pain Loc      Pain Edu?      Excl. in [place]?     Constitutional: Alert and oriented. Patient with increased respiratory rate, and diffuse wheeze Eyes: Conjunctivae are normal Head: Atraumatic HEENT: No congestion/rhinnorhea. Mucous membranes are moist.  Oropharynx non-erythematous Neck:   Nontender with no meningismus, no masses, no stridor Cardiovascular: Irregular, mild tachycardia. Grossly normal heart sounds.  Good peripheral circulation. Respiratory: Increased work of breathing, diffuse wheezes auscultated, diminished in the bases Abdominal: Soft and nontender. No distention. No guarding no rebound Back:  There is no focal tenderness or step off.  there is no midline  tenderness there are no lesions noted. there is no CVA tenderness Musculoskeletal: No lower extremity tenderness, no upper extremity tenderness. No joint effusions, no DVT signs strong distal pulses no edema Neurologic:  Normal speech and language. No gross focal neurologic deficits are appreciated.  Skin:  Skin is warm, dry and intact. No rash noted. Psychiatric: Mood and affect are normal. Speech and behavior are normal.  ____________________________________________   LABS (all labs ordered are listed, but only abnormal results are displayed)  Labs Reviewed  CBC - Abnormal; Notable for the following:       Result Value   WBC 16.0 (*)    RBC 3.70 (*)    Hemoglobin 11.5 (*)    HCT 33.7 (*)    All other components within normal limits  TROPONIN I - Abnormal; Notable for the following:    Troponin I 0.07 (*)    All other components within normal limits  COMPREHENSIVE METABOLIC PANEL - Abnormal; Notable for the following:    Chloride 90 (*)    CO2 34 (*)    Glucose, Bld 113 (*)    BUN 24 (*)    Creatinine, Ser 4.12 (*)    Calcium 8.3 (*)    Total Bilirubin 1.3 (*)    GFR calc non Af Amer 13 (*)    GFR calc Af Amer 15 (*)    All other components within normal limits  BRAIN NATRIURETIC PEPTIDE - Abnormal; Notable for the following:    B Natriuretic Peptide 751.0 (*)    All other components within normal limits  BLOOD GAS, ARTERIAL - Abnormal; Notable for the following:    pH, Arterial 7.54 (*)    pO2, Arterial 179 (*)    Bicarbonate 36.8 (*)    Acid-Base Excess 12.9 (*)    All other components within normal limits  PROTIME-INR - Abnormal; Notable for the following:    Prothrombin Time 18.7 (*)    All other components within normal limits  CULTURE, BLOOD (ROUTINE X 2)  CULTURE, BLOOD (ROUTINE X 2)  MRSA PCR SCREENING  LACTIC ACID, PLASMA  LACTIC ACID, PLASMA   ____________________________________________  EKG  I personally interpreted any EKGs ordered by me or  triage Patient with a right bundle-branch block, mild tachycardia, rate 116, normal axis, sinus tach ____________________________________________  RADIOLOGY  I reviewed any imaging ordered by me or triage that were performed during my shift and, if possible, patient and/or family made aware of any abnormal findings. ____________________________________________   PROCEDURES  Procedure(s) performed: None  Procedures  Critical Care performed: CRITICAL CARE Performed by: Jeneen Rinks  A Simrah Chatham   Total critical care time: 42 minutes  Critical care time was exclusive of separately billable procedures and treating other patients.  Critical care was necessary to treat or prevent imminent or life-threatening deterioration.  Critical care was time spent personally by me on the following activities: development of treatment plan with patient and/or surrogate as well as nursing, discussions with consultants, evaluation of patient's response to treatment, examination of patient, obtaining history from patient or surrogate, ordering and performing treatments and interventions, ordering and review of laboratory studies, ordering and review of radiographic studies, pulse oximetry and re-evaluation of patient's condition.   ____________________________________________   INITIAL IMPRESSION / ASSESSMENT AND PLAN / ED COURSE  Pertinent labs & imaging results that were available during my care of the patient were reviewed by me and considered in my medical decision making (see chart for details).  Patient here with cough wheeze and fever, we will treat her for hospital-acquired pneumonia. I initially put him in for possible sepsis given fever cough and tachycardia however lactic is negative. I don't think the patient will require the full lactic fluid bolus as I think that will only make him sicker and given dialysis status however we are giving him ginger IV fluids here. Discussed with the hospitalist who  agrees. We are treating for possible hospital-acquired pneumonia although chest x-ray does not show it he is febrile and coughing. Abdomen is benign at this time no evidence of intra-abdominal pathology no vomiting no diarrhea. Low suspicion for PE given fever 106" mucus and cough. Lungs were diffusely wheezy. I did place the patient emergently on BiPAP as he was working somewhat to breathe and he is very much more comfortable this time and feels a great deal better. He does have trouble lying flat, BNP is somewhat elevated which is more reason to avoid significant fluids in this patient. He makes scant amounts of urine. He likely over for further dialysis and fact. I don't think at this time a CT scan is indicated and even if it were I of great concern about the patient's ability to lie flat to tolerated. Hospitalist agrees to this plan. Troponin is borderline however patient is dialysis and 6 likely demand ischemia  ____________________________________________   FINAL CLINICAL IMPRESSION(S) / ED DIAGNOSES  Final diagnoses:  SOB (shortness of breath)      This chart was dictated using voice recognition software.  Despite best efforts to proofread,  errors can occur which can change meaning.      Schuyler Amor, MD 06/20/17 [phone]

## 2017-06-20 NOTE — Progress Notes (Signed)
Pharmacy Antibiotic Note  Maurice Peterson is a 76 y.o. male admitted on 06/20/2017 with respiratory failure recently discharged from facility .  Pharmacy has been consulted for Vancomycin and Cefepime dosing.  Plan: Patient is a hemodialysis patient, who receives dialysis on MWF. Patient received dialysis on Monday morning; however still experiencing shortness of breath.  Will give Vancomycin 2 g IV x 1 and will order Vancomycin 1000 mg (maintenance doses) to be given on dialysis days. Will follow up with nephrology for plan on whether patient will be dialyzed again today.   Height: 5\' 11"  (180.3 cm) Weight: 240 lb (108.9 kg) IBW/kg (Calculated) : 75.3  Temp (24hrs), Avg:98.4 F (36.9 C), Min:98.4 F (36.9 C), Max:98.4 F (36.9 C)   Recent Labs Lab 06/17/17 1430 06/18/17 0237 06/19/17 0416  WBC 13.4* 13.7* 14.2*  CREATININE 3.72* 4.69*  --     Estimated Creatinine Clearance: 17.1 mL/min (A) (by C-G formula based on SCr of 4.69 mg/dL (H)).    Allergies  Allergen Reactions  . Ace Inhibitors Nausea And Vomiting  . Amoxicillin-Pot Clavulanate Nausea And Vomiting  . Other Nausea And Vomiting    Anti-inflammatories    Antimicrobials this admission: Vancomycin 6/25 >>  Cefepime 6/25 >>   Dose adjustments this admission:   Microbiology results:  BCx:   UCx:    Sputum:    MRSA PCR:   Thank you for allowing pharmacy to be a part of this patient's care.  Chananya,Donisha Hoch D 06/20/2017 12:29 PM

## 2017-06-20 NOTE — Progress Notes (Signed)
MD aware of elevated troponin at 0.07. No new orders.

## 2017-06-20 NOTE — ED Triage Notes (Signed)
Chest pain with inspiration since yesterday. Recent admission of rapid a fib.

## 2017-06-20 NOTE — ED Notes (Signed)
Pt removed from BiPap by respiratory per pt. Pt tolerating O2 via [place]. Pt respirations even and non-labored. Pt speaking in complete sentences without difficulty.

## 2017-06-21 LAB — BASIC METABOLIC PANEL
ANION GAP: 12 (ref 5–15)
BUN: 46 mg/dL — ABNORMAL HIGH (ref 6–20)
CO2: 30 mmol/L (ref 22–32)
Calcium: 8.2 mg/dL — ABNORMAL LOW (ref 8.9–10.3)
Chloride: 95 mmol/L — ABNORMAL LOW (ref 101–111)
Creatinine, Ser: 5.99 mg/dL — ABNORMAL HIGH (ref 0.61–1.24)
GFR calc Af Amer: 10 mL/min — ABNORMAL LOW (ref >60)
GFR, EST NON AFRICAN AMERICAN: 8 mL/min — AB (ref >60)
Glucose, Bld: 135 mg/dL — ABNORMAL HIGH (ref 65–99)
POTASSIUM: 3.8 mmol/L (ref 3.5–5.1)
SODIUM: 137 mmol/L (ref 135–145)

## 2017-06-21 LAB — CBC
HEMATOCRIT: 29.8 % — AB (ref 40.0–52.0)
HEMOGLOBIN: 10.2 g/dL — AB (ref 13.0–18.0)
MCH: 31.8 pg (ref 26.0–34.0)
MCHC: 34.2 g/dL (ref 32.0–36.0)
MCV: 93 fL (ref 80.0–100.0)
Platelets: 167 10*3/uL (ref 150–440)
RBC: 3.21 MIL/uL — ABNORMAL LOW (ref 4.40–5.90)
RDW: 14.2 % (ref 11.5–14.5)
WBC: 17 10*3/uL — AB (ref 3.8–10.6)

## 2017-06-21 LAB — TROPONIN I: TROPONIN I: 0.05 ng/mL — AB (ref <0.03)

## 2017-06-21 MED ORDER — DEXTROSE 5 % IV SOLN
1.0000 g | INTRAVENOUS | Status: DC
  Administered 2017-06-21: 1 g via INTRAVENOUS
  Filled 2017-06-21 (×2): qty 1

## 2017-06-21 MED ORDER — IPRATROPIUM-ALBUTEROL 0.5-2.5 (3) MG/3ML IN SOLN
3.0000 mL | Freq: Three times a day (TID) | RESPIRATORY_TRACT | Status: DC
  Administered 2017-06-21 – 2017-06-22 (×3): 3 mL via RESPIRATORY_TRACT
  Filled 2017-06-21 (×4): qty 3

## 2017-06-21 NOTE — Progress Notes (Signed)
[place] [facility] [place] NAME: Maurice Peterson    MR#:  [id number]  DATE OF BIRTH:  10/28/1941  SUBJECTIVE: Seen at bedside. Admitted for shortness of breath, COPD flare.  He is doing better   CHIEF COMPLAINT:   Chief Complaint  Patient presents with  . Tachycardia  . Chest Pain    REVIEW OF SYSTEMS:   ROS CONSTITUTIONAL: No fever, fatigue or weakness.  EYES: No blurred or double vision.  EARS, NOSE, AND THROAT: No tinnitus or ear pain.  RESPIRATORY: No cough, shortness of breath, wheezing or hemoptysis.  CARDIOVASCULAR: No chest pain, orthopnea, edema.  GASTROINTESTINAL: No nausea, vomiting, diarrhea or abdominal pain.  GENITOURINARY: No dysuria, hematuria.  ENDOCRINE: No polyuria, nocturia,  HEMATOLOGY: No anemia, easy bruising or bleeding SKIN: No rash or lesion. MUSCULOSKELETAL: No joint pain or arthritis.   NEUROLOGIC: No tingling, numbness, weakness.  PSYCHIATRY: No anxiety or depression.   DRUG ALLERGIES:   Allergies  Allergen Reactions  . Ace Inhibitors Nausea And Vomiting  . Amoxicillin-Pot Clavulanate Nausea And Vomiting  . Other Nausea And Vomiting    Anti-inflammatories    VITALS:  Blood pressure (!) 97/55, pulse 89, temperature 97.7 F (36.5 C), temperature source Oral, resp. rate (!) 22, height 5\' 11"  (1.803 m), weight 110.3 kg (243 lb 1.6 oz), SpO2 93 %.  PHYSICAL EXAMINATION:  GENERAL:  76 y.o.-year-old patient lying in the bed with no acute distress.  EYES: Pupils equal, round, reactive to light .[name] No scleral icterus. Extraocular muscles intact.  HEENT: Head atraumatic, normocephalic. Oropharynx and nasopharynx clear.  NECK:  Supple, no jugular venous distention. No thyroid enlargement, no tenderness.  LUNGS: Faint expiratory wheeze bilaterally,no rales, CARDIOVASCULAR: S1, S2 normal. No murmurs, rubs, or gallops.  ABDOMEN: Soft, nontender, nondistended. Bowel sounds present. No organomegaly or mass.   EXTREMITIES: No pedal edema, cyanosis, or clubbing.  NEUROLOGIC: Cranial nerves II through XII are intact. Muscle strength 5/5 in all extremities. Sensation intact. Gait not checked.  PSYCHIATRIC: The patient is alert and oriented x 3.  SKIN: No obvious rash, lesion, or ulcer.    LABORATORY PANEL:   CBC  Recent Labs Lab 06/21/17 0506  WBC 17.0*  HGB 10.2*  HCT 29.8*  PLT 167   ------------------------------------------------------------------------------------------------------------------  Chemistries   Recent Labs Lab 06/20/17 1202 06/21/17 0506  NA 136 137  K 3.6 3.8  CL 90* 95*  CO2 34* 30  GLUCOSE 113* 135*  BUN 24* 46*  CREATININE 4.12* 5.99*  CALCIUM 8.3* 8.2*  AST 19  --   ALT 21  --   ALKPHOS 40  --   BILITOT 1.3*  --    ------------------------------------------------------------------------------------------------------------------  Cardiac Enzymes  Recent Labs Lab 06/21/17 0506  TROPONINI 0.05*   ------------------------------------------------------------------------------------------------------------------  RADIOLOGY:  Dg Chest Port 1 View  Result Date: 06/20/2017 CLINICAL DATA:  Inspiratory chest pain began yesterday. Recently hospitalized for rapid atrial fibrillation. History of COPD, hypertension, chronic renal insufficiency, discontinued smoking in 2010. EXAM: PORTABLE CHEST 1 VIEW COMPARISON:  PA and lateral chest x-ray of June 17, 2017 FINDINGS: The lungs are adequately inflated. There is no focal infiltrate. The interstitial markings are coarse. The pulmonary vascularity is not engorged. The cardiac silhouette is enlarged but stable. The bony thorax exhibits no acute abnormality. IMPRESSION: COPD with chronic interstitial changes. No alveolar pneumonia, pneumothorax, pleural effusion, or pulmonary edema. Electronically Signed   By: David  Martinique M.D.   On: 06/20/2017 11:51    EKG:  Orders placed or performed during the hospital  encounter of 06/20/17  . ED EKG within 10 minutes  . ED EKG within 10 minutes    ASSESSMENT AND PLAN:  #1 acute respiratory failure due to COPD exacerbation, at CPAP. Continue vancomycin, patient is off the BiPAP. And oxygen as well. But still has wheezing so continue nebulizers,  IV steroids. Possible discharge tomorrow home. Continue vancomycin with hemodialysis.  2 ESRD on hemodialysis Monday, Wednesday, Friday. Patient did have hemodialysis yesterday.  GERD: Continue PPIs  Hyperlipidemia: Continue statins.  Systolic heart failure with EF 35%.   Chronic atrial fibrillation: Rate controlled. On amiodarone and also Eliquis.d/c asa D/w RN and pt.  t.he records are reviewed and case discussed with Care Management/Social Workerr. Management plans discussed with the patient, family and they are in agreement.  CODE STATUS: full  TOTAL TIME TAKING CARE OF THIS PATIENT: 55minutes.   POSSIBLE D/C IN 1-2 DAYS, DEPENDING ON CLINICAL CONDITION.   Epifanio Lesches M.D on 06/21/2017 at 11:07 AM  Between 7am to 6pm - Pager - [phone]  After 6pm go to [email or web address] - password [email or web address] [facility]  Office  [phone]  CC: Primary care physician; Madelyn Brunner, MD   Note: This dictation was prepared with Dragon dictation along with smaller phrase technology. Any transcriptional errors that result from this process are unintentional.

## 2017-06-21 NOTE — Progress Notes (Signed)
[facility]  ROUNDING NOTE   Subjective:  Patient completed hemodialysis yesterday. He reports that he is feeling somewhat better today. Due for dialysis again tomorrow.   Objective:  Vital signs in last 24 hours:  Temp:  [97.7 F (36.5 C)-98.3 F (36.8 C)] 97.7 F (36.5 C) (06/26 0430) Pulse Rate:  [74-106] 89 (06/26 0430) Resp:  [16-34] 22 (06/26 0430) BP: (97-128)/(55-88) 97/55 (06/26 0430) SpO2:  [62 %-99 %] 93 % (06/26 0758) Weight:  [110.3 kg (243 lb 1.6 oz)] 110.3 kg (243 lb 1.6 oz) (06/26 0500)  Weight change:  Filed Weights   06/20/17 1029 06/21/17 0500  Weight: 108.9 kg (240 lb) 110.3 kg (243 lb 1.6 oz)    Intake/Output: I/O last 3 completed shifts: In: 540 [P.O.:240; IV Piggyback:300] Out: -    Intake/Output this shift:  Total I/O In: 360 [P.O.:360] Out: 200 [Urine:200]  Physical Exam: General: No acute distress  Head: Normocephalic, atraumatic. Moist oral mucosal membranes  Eyes: Anicteric  Neck: Supple, trachea midline  Lungs:  Mild bilateral wheezing, normal work of breathing  Heart: S1S2 no rubs  Abdomen:  Soft, nontender, bowel sounds present  Extremities: 1+ peripheral edema.  Neurologic: Awake, alert, following commands  Skin: No lesions  Access: LUE AVF    Basic Metabolic Panel:  Recent Labs Lab 06/17/17 1430 06/18/17 0237 06/20/17 1202 06/21/17 0506  NA 136 136 136 137  K 3.7 3.7 3.6 3.8  CL 94* 95* 90* 95*  CO2 31 30 34* 30  GLUCOSE 113* 112* 113* 135*  BUN 20 28* 24* 46*  CREATININE 3.72* 4.69* 4.12* 5.99*  CALCIUM 8.3* 8.2* 8.3* 8.2*    Liver Function Tests:  Recent Labs Lab 06/20/17 1202  AST 19  ALT 21  ALKPHOS 40  BILITOT 1.3*  PROT 7.5  ALBUMIN 3.8   No results for input(s): LIPASE, AMYLASE in the last 168 hours. No results for input(s): AMMONIA in the last 168 hours.  CBC:  Recent Labs Lab 06/17/17 1430 06/18/17 0237 06/19/17 0416 06/20/17 1202 06/21/17 0506  WBC 13.4* 13.7* 14.2*  16.0* 17.0*  HGB 12.6* 11.9* 11.9* 11.5* 10.2*  HCT 37.6* 35.3* 35.2* 33.7* 29.8*  MCV 93.7 93.4 93.7 91.1 93.0  PLT 209 190 183 190 167    Cardiac Enzymes:  Recent Labs Lab 06/18/17 0237 06/20/17 1202 06/20/17 1710 06/20/17 2236 06/21/17 0506  TROPONINI 0.06* 0.07* 0.07* 0.06* 0.05*    BNP: Invalid input(s): POCBNP  CBG: No results for input(s): GLUCAP in the last 168 hours.  Microbiology: Results for orders placed or performed during the hospital encounter of 06/20/17  Culture, blood (routine x 2)     Status: None (Preliminary result)   Collection Time: 06/20/17 12:02 PM  Result Value Ref Range Status   Specimen Description BLOOD RIGHT ANTECUBITAL  Final   Special Requests   Final    BOTTLES DRAWN AEROBIC AND ANAEROBIC Blood Culture results may not be optimal due to an excessive volume of blood received in culture bottles   Culture NO GROWTH < 24 HOURS  Final   Report Status PENDING  Incomplete  Culture, blood (routine x 2)     Status: None (Preliminary result)   Collection Time: 06/20/17 12:03 PM  Result Value Ref Range Status   Specimen Description BLOOD BLOOD RIGHT ARM  Final   Special Requests   Final    BOTTLES DRAWN AEROBIC AND ANAEROBIC Blood Culture adequate volume   Culture NO GROWTH < 24 HOURS  Final  Report Status PENDING  Incomplete    Coagulation Studies:  Recent Labs  06/20/17 1216  LABPROT 18.7*  INR 1.55    Urinalysis: No results for input(s): COLORURINE, LABSPEC, PHURINE, GLUCOSEU, HGBUR, BILIRUBINUR, KETONESUR, PROTEINUR, UROBILINOGEN, NITRITE, LEUKOCYTESUR in the last 72 hours.  Invalid input(s): APPERANCEUR    Imaging: Dg Chest Port 1 View  Result Date: 06/20/2017 CLINICAL DATA:  Inspiratory chest pain began yesterday. Recently hospitalized for rapid atrial fibrillation. History of COPD, hypertension, chronic renal insufficiency, discontinued smoking in 2010. EXAM: PORTABLE CHEST 1 VIEW COMPARISON:  PA and lateral chest x-ray of  June 17, 2017 FINDINGS: The lungs are adequately inflated. There is no focal infiltrate. The interstitial markings are coarse. The pulmonary vascularity is not engorged. The cardiac silhouette is enlarged but stable. The bony thorax exhibits no acute abnormality. IMPRESSION: COPD with chronic interstitial changes. No alveolar pneumonia, pneumothorax, pleural effusion, or pulmonary edema. Electronically Signed   By: David  Martinique M.D.   On: 06/20/2017 11:51     Medications:   . ceFEPime (MAXIPIME) IV    . [START ON 06/22/2017] vancomycin     . amiodarone  200 mg Oral Daily  . apixaban  5 mg Oral BID  . atorvastatin  10 mg Oral q1800  . calcium acetate  1,334 mg Oral TID WC  . carvedilol  6.25 mg Oral BID WC  . cholecalciferol  2,000 Units Oral Daily  . febuxostat  40 mg Oral Daily  . furosemide  80 mg Oral Daily  . ipratropium-albuterol  3 mL Nebulization TID  . losartan  25 mg Oral Daily  . mouth rinse  15 mL Mouth Rinse BID  . methylPREDNISolone (SOLU-MEDROL) injection  40 mg Intravenous Q8H  . mometasone-formoterol  2 puff Inhalation BID  . montelukast  10 mg Oral QHS  . multivitamin  1 tablet Oral Daily  . pantoprazole  40 mg Oral Daily   acetaminophen **OR** acetaminophen, albuterol, diphenhydrAMINE, HYDROcodone-acetaminophen, ondansetron **OR** ondansetron (ZOFRAN) IV, senna-docusate  Assessment/ Plan:  76 y.o. male ESRD on HD MWF followed by [facility] nephrology, COPD, GERD, hypertension, hyperlipidemia, prostate cancer, COPD, chronic systolic heart failure ejection fraction 35% presents now with shortness of breath and acute respiratory failure.  [facility]/MWF/[address]  1. ESRD on HD MWF:  No urgent indication for dialysis today. We will plan for dialysis again tomorrow.  2. Acute respiratory failure. Felt to be secondary to COPD exacerbation and healthcare associated pneumonia. Continue cefepime and vancomycin. Patient also on Solu-Medrol.  3.  Anemia of chronic kidney  disease.  Continue to hold Epogen.  4. Secondary hyperparathyroidism. Check intact PTH and phosphorus with next dialysis treatment.   LOS: 1 Oneita Allmon 6/26/201811:26 AM

## 2017-06-22 LAB — PHOSPHORUS: PHOSPHORUS: 5.6 mg/dL — AB (ref 2.5–4.6)

## 2017-06-22 MED ORDER — SODIUM CHLORIDE 0.9 % IV SOLN: 100.0000 mL | INTRAVENOUS | Status: DC | PRN

## 2017-06-22 MED ORDER — LIDOCAINE-PRILOCAINE 2.5-2.5 % EX CREA
1.0000 "application " | TOPICAL_CREAM | CUTANEOUS | Status: DC | PRN
  Filled 2017-06-22: qty 5

## 2017-06-22 MED ORDER — HEPARIN SODIUM (PORCINE) 1000 UNIT/ML DIALYSIS
1000.0000 [IU] | INTRAMUSCULAR | Status: DC | PRN
  Filled 2017-06-22: qty 1

## 2017-06-22 MED ORDER — ALTEPLASE 2 MG IJ SOLR: 2.0000 mg | Freq: Once | INTRAMUSCULAR | Status: DC | PRN

## 2017-06-22 MED ORDER — PENTAFLUOROPROP-TETRAFLUOROETH EX AERO
1.0000 "application " | INHALATION_SPRAY | CUTANEOUS | Status: DC | PRN
  Filled 2017-06-22: qty 30

## 2017-06-22 MED ORDER — AMIODARONE HCL 200 MG PO TABS: 200.0000 mg | ORAL_TABLET | Freq: Every day | ORAL | 0 refills | Status: DC

## 2017-06-22 MED ORDER — LIDOCAINE HCL (PF) 1 % IJ SOLN
5.0000 mL | INTRAMUSCULAR | Status: DC | PRN
  Filled 2017-06-22: qty 5

## 2017-06-22 MED ORDER — PREDNISONE 10 MG (21) PO TBPK: ORAL_TABLET | ORAL | 0 refills | Status: DC

## 2017-06-22 NOTE — Progress Notes (Signed)
Post hd vitals 

## 2017-06-22 NOTE — Progress Notes (Signed)
Post hd assessment 

## 2017-06-22 NOTE — Progress Notes (Signed)
IV was removed. Discharge instructions and prescriptions were provided to the pt and wife at bedside. All questions were answered. The pt was taken downstairs via wheelchair by NT.

## 2017-06-22 NOTE — Progress Notes (Signed)
[facility]  ROUNDING NOTE   Subjective:  Patient seen at bedside. He is due for hemodialysis today. Orders have been prepared.    Objective:  Vital signs in last 24 hours:  Temp:  [97.5 F (36.4 C)-98.5 F (36.9 C)] 98.5 F (36.9 C) (06/27 0820) Pulse Rate:  [76-87] 85 (06/27 0820) Resp:  [18-19] 18 (06/27 0820) BP: (84-129)/(59-72) 129/65 (06/27 0820) SpO2:  [91 %-99 %] 94 % (06/27 0903) Weight:  [112 kg (246 lb 14.4 oz)] 112 kg (246 lb 14.4 oz) (06/27 0500)  Weight change: 3.13 kg (6 lb 14.4 oz) Filed Weights   06/20/17 1029 06/21/17 0500 06/22/17 0500  Weight: 108.9 kg (240 lb) 110.3 kg (243 lb 1.6 oz) 112 kg (246 lb 14.4 oz)    Intake/Output: I/O last 3 completed shifts: In: 48 [P.O.:840; IV Piggyback:50] Out: 350 [Urine:350]   Intake/Output this shift:  Total I/O In: 240 [P.O.:240] Out: 0   Physical Exam: General: No acute distress  Head: Normocephalic, atraumatic. Moist oral mucosal membranes  Eyes: Anicteric  Neck: Supple, trachea midline  Lungs:  Mild bilateral wheezing, normal work of breathing  Heart: S1S2 no rubs  Abdomen:  Soft, nontender, bowel sounds present  Extremities: 1+ peripheral edema.  Neurologic: Awake, alert, following commands  Skin: No lesions  Access: LUE AVF    Basic Metabolic Panel:  Recent Labs Lab 06/17/17 1430 06/18/17 0237 06/20/17 1202 06/21/17 0506  NA 136 136 136 137  K 3.7 3.7 3.6 3.8  CL 94* 95* 90* 95*  CO2 31 30 34* 30  GLUCOSE 113* 112* 113* 135*  BUN 20 28* 24* 46*  CREATININE 3.72* 4.69* 4.12* 5.99*  CALCIUM 8.3* 8.2* 8.3* 8.2*    Liver Function Tests:  Recent Labs Lab 06/20/17 1202  AST 19  ALT 21  ALKPHOS 40  BILITOT 1.3*  PROT 7.5  ALBUMIN 3.8   No results for input(s): LIPASE, AMYLASE in the last 168 hours. No results for input(s): AMMONIA in the last 168 hours.  CBC:  Recent Labs Lab 06/17/17 1430 06/18/17 0237 06/19/17 0416 06/20/17 1202 06/21/17 0506  WBC  13.4* 13.7* 14.2* 16.0* 17.0*  HGB 12.6* 11.9* 11.9* 11.5* 10.2*  HCT 37.6* 35.3* 35.2* 33.7* 29.8*  MCV 93.7 93.4 93.7 91.1 93.0  PLT 209 190 183 190 167    Cardiac Enzymes:  Recent Labs Lab 06/18/17 0237 06/20/17 1202 06/20/17 1710 06/20/17 2236 06/21/17 0506  TROPONINI 0.06* 0.07* 0.07* 0.06* 0.05*    BNP: Invalid input(s): POCBNP  CBG: No results for input(s): GLUCAP in the last 168 hours.  Microbiology: Results for orders placed or performed during the hospital encounter of 06/20/17  Culture, blood (routine x 2)     Status: None (Preliminary result)   Collection Time: 06/20/17 12:02 PM  Result Value Ref Range Status   Specimen Description BLOOD RIGHT ANTECUBITAL  Final   Special Requests   Final    BOTTLES DRAWN AEROBIC AND ANAEROBIC Blood Culture results may not be optimal due to an excessive volume of blood received in culture bottles   Culture NO GROWTH 2 DAYS  Final   Report Status PENDING  Incomplete  Culture, blood (routine x 2)     Status: None (Preliminary result)   Collection Time: 06/20/17 12:03 PM  Result Value Ref Range Status   Specimen Description BLOOD BLOOD RIGHT ARM  Final   Special Requests   Final    BOTTLES DRAWN AEROBIC AND ANAEROBIC Blood Culture adequate volume  Culture NO GROWTH 2 DAYS  Final   Report Status PENDING  Incomplete    Coagulation Studies:  Recent Labs  06/20/17 1216  LABPROT 18.7*  INR 1.55    Urinalysis: No results for input(s): COLORURINE, LABSPEC, PHURINE, GLUCOSEU, HGBUR, BILIRUBINUR, KETONESUR, PROTEINUR, UROBILINOGEN, NITRITE, LEUKOCYTESUR in the last 72 hours.  Invalid input(s): APPERANCEUR    Imaging: Dg Chest Port 1 View  Result Date: 06/20/2017 CLINICAL DATA:  Inspiratory chest pain began yesterday. Recently hospitalized for rapid atrial fibrillation. History of COPD, hypertension, chronic renal insufficiency, discontinued smoking in 2010. EXAM: PORTABLE CHEST 1 VIEW COMPARISON:  PA and lateral chest  x-ray of June 17, 2017 FINDINGS: The lungs are adequately inflated. There is no focal infiltrate. The interstitial markings are coarse. The pulmonary vascularity is not engorged. The cardiac silhouette is enlarged but stable. The bony thorax exhibits no acute abnormality. IMPRESSION: COPD with chronic interstitial changes. No alveolar pneumonia, pneumothorax, pleural effusion, or pulmonary edema. Electronically Signed   By: David  Martinique M.D.   On: 06/20/2017 11:51     Medications:   . sodium chloride    . sodium chloride    . ceFEPime (MAXIPIME) IV 1 g (06/21/17 1711)   . amiodarone  200 mg Oral Daily  . apixaban  5 mg Oral BID  . atorvastatin  10 mg Oral q1800  . calcium acetate  1,334 mg Oral TID WC  . carvedilol  6.25 mg Oral BID WC  . cholecalciferol  2,000 Units Oral Daily  . febuxostat  40 mg Oral Daily  . furosemide  80 mg Oral Daily  . ipratropium-albuterol  3 mL Nebulization TID  . losartan  25 mg Oral Daily  . mouth rinse  15 mL Mouth Rinse BID  . methylPREDNISolone (SOLU-MEDROL) injection  40 mg Intravenous Q8H  . mometasone-formoterol  2 puff Inhalation BID  . montelukast  10 mg Oral QHS  . multivitamin  1 tablet Oral Daily  . pantoprazole  40 mg Oral Daily   sodium chloride, sodium chloride, acetaminophen **OR** acetaminophen, albuterol, alteplase, diphenhydrAMINE, heparin, HYDROcodone-acetaminophen, lidocaine (PF), lidocaine-prilocaine, ondansetron **OR** ondansetron (ZOFRAN) IV, pentafluoroprop-tetrafluoroeth, senna-docusate  Assessment/ Plan:  76 y.o. male ESRD on HD MWF followed by [facility] nephrology, COPD, GERD, hypertension, hyperlipidemia, prostate cancer, COPD, chronic systolic heart failure ejection fraction 35% presents now with shortness of breath and acute respiratory failure.  [facility]/MWF/[address]  1. ESRD on HD MWF:  Patient due for hemodialysis today. Orders have been prepared. Ultrafiltration target 2 kg.  2. Acute respiratory failure. Felt to be  secondary to COPD exacerbation and healthcare associated pneumonia. Patient breathing comfortably at the moment. Management per hospitalist.  3.  Anemia of chronic kidney disease. Hemoglobin 10.2. Continue erythropoietin stimulate agents as an outpatient.  4. Secondary hyperparathyroidism. Check intact PTH and phosphorus today.   LOS: 2 Maurice Peterson 6/27/201811:21 AM

## 2017-06-22 NOTE — Progress Notes (Signed)
Pre hd assessment  

## 2017-06-22 NOTE — Progress Notes (Signed)
  End of hd 

## 2017-06-22 NOTE — Progress Notes (Signed)
Pre hd info 

## 2017-06-22 NOTE — Progress Notes (Addendum)
[place] [facility] [place] NAME: Maurice Peterson    MR#:  [id number]  DATE OF BIRTH:  06/04/1941 Denies any SOB,stable for discharge.  Chief Complaint  Patient presents with  . Tachycardia  . Chest Pain    REVIEW OF SYSTEMS:   ROS CONSTITUTIONAL: No fever, fatigue or weakness.  EYES: No blurred or double vision.  EARS, NOSE, AND THROAT: No tinnitus or ear pain.  RESPIRATORY: No cough, shortness of breath, wheezing or hemoptysis.  CARDIOVASCULAR: No chest pain, orthopnea, edema.  GASTROINTESTINAL: No nausea, vomiting, diarrhea or abdominal pain.  GENITOURINARY: No dysuria, hematuria.  ENDOCRINE: No polyuria, nocturia,  HEMATOLOGY: No anemia, easy bruising or bleeding SKIN: No rash or lesion. MUSCULOSKELETAL: No joint pain or arthritis.   NEUROLOGIC: No tingling, numbness, weakness.  PSYCHIATRY: No anxiety or depression.   DRUG ALLERGIES:   Allergies  Allergen Reactions  . Ace Inhibitors Nausea And Vomiting  . Amoxicillin-Pot Clavulanate Nausea And Vomiting  . Other Nausea And Vomiting    Anti-inflammatories    VITALS:  Blood pressure 122/68, pulse 87, temperature 97.9 F (36.6 C), temperature source Oral, resp. rate (!) 21, height 5\' 11"  (1.803 m), weight 113.4 kg (250 lb), SpO2 96 %.  PHYSICAL EXAMINATION:  GENERAL:  76 y.o.-year-old patient lying in the bed with no acute distress.  EYES: Pupils equal, round, reactive to light .[name] No scleral icterus. Extraocular muscles intact.  HEENT: Head atraumatic, normocephalic. Oropharynx and nasopharynx clear.  NECK:  Supple, no jugular venous distention. No thyroid enlargement, no tenderness.  LUNGS: Faint expiratory wheeze bilaterally,no rales, CARDIOVASCULAR: S1, S2 normal. No murmurs, rubs, or gallops.  ABDOMEN: Soft, nontender, nondistended. Bowel sounds present. No organomegaly or mass.  EXTREMITIES: No pedal edema, cyanosis, or clubbing.  NEUROLOGIC: Cranial nerves II through XII  are intact. Muscle strength 5/5 in all extremities. Sensation intact. Gait not checked.  PSYCHIATRIC: The patient is alert and oriented x 3.  SKIN: No obvious rash, lesion, or ulcer.    LABORATORY PANEL:   CBC  Recent Labs Lab 06/21/17 0506  WBC 17.0*  HGB 10.2*  HCT 29.8*  PLT 167   ------------------------------------------------------------------------------------------------------------------  Chemistries   Recent Labs Lab 06/20/17 1202 06/21/17 0506  NA 136 137  K 3.6 3.8  CL 90* 95*  CO2 34* 30  GLUCOSE 113* 135*  BUN 24* 46*  CREATININE 4.12* 5.99*  CALCIUM 8.3* 8.2*  AST 19  --   ALT 21  --   ALKPHOS 40  --   BILITOT 1.3*  --    ------------------------------------------------------------------------------------------------------------------  Cardiac Enzymes  Recent Labs Lab 06/21/17 0506  TROPONINI 0.05*   ------------------------------------------------------------------------------------------------------------------  RADIOLOGY:  No results found.  EKG:   Orders placed or performed during the hospital encounter of 06/20/17  . ED EKG within 10 minutes  . ED EKG within 10 minutes    ASSESSMENT AND PLAN:  #1 acute respiratory failure due to COPD exacerbation, HCAP: Stable for discharged, COPD resolving, discharge home with prednisone Dosepak, continue antibiotics cefepime for 3 more doses with dialysis.[name] 2 ESRD on hemodialysis Monday, Wednesday, Friday. Patient did have hemodialysis  Today.  GERD: Continue PPIs  Hyperlipidemia: Continue statins.  Systolic heart failure with EF 35%.   Chronic atrial fibrillation: Rate controlled. On amiodarone and also Eliquis.d/c asa D/w RN and pt. Discharge home after HD today. All  records are reviewed and case discussed with Care Management/Social Workerr. Management plans discussed with the patient, family and they are in  agreement.  CODE STATUS: full  TOTAL TIME TAKING CARE OF THIS PATIENT:  75minutes.   D/c home today. Epifanio Lesches M.D on 06/22/2017 at 2:09 PM  Between 7am to 6pm - Pager - [phone]  After 6pm go to [email or web address] - password [email or web address] [facility]  Office  [phone]  CC: Primary care physician; Madelyn Brunner, MD   Note: This dictation was prepared with Dragon dictation along with smaller phrase technology. Any transcriptional errors that result from this process are unintentional.

## 2017-06-23 DIAGNOSIS — I480 Paroxysmal atrial fibrillation: Secondary | ICD-10-CM | POA: Insufficient documentation

## 2017-06-23 LAB — PARATHYROID HORMONE, INTACT (NO CA): PTH: 111 pg/mL — AB (ref 15–65)

## 2017-06-25 LAB — CULTURE, BLOOD (ROUTINE X 2)
Culture: NO GROWTH
Culture: NO GROWTH
Special Requests: ADEQUATE

## 2017-06-25 NOTE — Discharge Summary (Signed)
Maurice Peterson, is a 76 y.o. male  DOB [birth year]  MRN [id number].  Admission date:  06/20/2017  Admitting Physician  Bettey Costa, MD  Discharge Date:  06/25/2017   Primary MD  Madelyn Brunner, MD  Recommendations for primary care physician for things to follow:  Follow-up with PCP in 1 week; and continue routine hemodialysis.   Admission Diagnosis  SOB (shortness of breath) [R06.02] Elevated troponin [R74.8] HCAP (healthcare-associated pneumonia) [J18.9] COPD exacerbation (HCC) [J44.1]   Discharge Diagnosis  SOB (shortness of breath) [R06.02] Elevated troponin [R74.8] HCAP (healthcare-associated pneumonia) [J18.9] COPD exacerbation (HCC) [J44.1]    Active Problems:   Respiratory failure (HCC)   COPD exacerbation (HCC)      Past Medical History:  Diagnosis Date  . Arthritis   . Chronic kidney disease    requires dialysis  . COPD (chronic obstructive pulmonary disease) ([place])   . GERD (gastroesophageal reflux disease)   . Hyperlipidemia   . Hypertension   . Prostate cancer ([place])   . Shortness of breath dyspnea     Past Surgical History:  Procedure Laterality Date  . CATARACT EXTRACTION W/PHACO Right 08/20/2015   Procedure: CATARACT EXTRACTION PHACO AND INTRAOCULAR LENS PLACEMENT ([place]);  Surgeon: Leandrew Koyanagi, MD;  Location: [place];  Service: Ophthalmology;  Laterality: Right;  . CATARACT EXTRACTION W/PHACO Left 09/10/2015   Procedure: CATARACT EXTRACTION PHACO AND INTRAOCULAR LENS PLACEMENT ([facility]);  Surgeon: Leandrew Koyanagi, MD;  Location: [place];  Service: Ophthalmology;  Laterality: Left;  . CORONARY STENT PLACEMENT    . 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 PERIPHERAL VASCULAR CATHETERIZATION Left 05/20/2015   Procedure: A/V Shuntogram/Fistulagram;  Surgeon:  Katha Cabal, MD;  Location: [place] [facility];  Service: Cardiovascular;  Laterality: Left;  . PERIPHERAL VASCULAR CATHETERIZATION N/A 05/20/2015   Procedure: A/V Shunt Intervention;  Surgeon: Katha Cabal, MD;  Location: [place] [facility];  Service: Cardiovascular;  Laterality: N/A;  . PERIPHERAL VASCULAR CATHETERIZATION Left 08/05/2015   Procedure: A/V Shuntogram/Fistulagram;  Surgeon: Katha Cabal, MD;  Location: [place];  Service: Cardiovascular;  Laterality: Left;  . PERIPHERAL VASCULAR CATHETERIZATION N/A 08/05/2015   Procedure: A/V Shunt Intervention;  Surgeon: Katha Cabal, MD;  Location: [place] [facility];  Service: Cardiovascular;  Laterality: N/A;  . PERIPHERAL VASCULAR CATHETERIZATION Left 01/06/2016   Procedure: A/V Shuntogram/Fistulagram;  Surgeon: Katha Cabal, MD;  Location: [facility];  Service: Cardiovascular;  Laterality: Left;  . PERIPHERAL VASCULAR CATHETERIZATION N/A 01/06/2016   Procedure: A/V Shunt Intervention;  Surgeon: Katha Cabal, MD;  Location: [place];  Service: Cardiovascular;  Laterality: N/A;  . 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  Hospital Course  Acute respiratory failure secondary to COPD exacerbation, healthcare associated pneumonia. Started on IV cefepime,  vancomycin,  IV steroids. Also continued on bronchodilators. Patient's symptoms improved, no fever. No WBC elevation. Discharge home on 27th of June after hemodialysis. Discharge to home with prednisone Dosepak, no bronchodilators, cefepime with hemodialysis for 3 doses.  Plan #2 ESRD on hemodialysis. Consulted nephrology and is admission for hemodialysis today. #3. hyperlipidemia: Continue statins #4 .systolic heart failure chronic, EF 35%. #5 .chronic atrial fibrillation: Rate controlled, continue amiodarone, eliquis. Discharged  home in stable condition.  Discharge Condition: stable   Follow UP      Discharge Instructions  and  Discharge Medications      Allergies as of 06/22/2017      Reactions   Ace Inhibitors Nausea And Vomiting   Amoxicillin-pot Clavulanate Nausea And Vomiting   Other Nausea And Vomiting   Anti-inflammatories      Medication List    TAKE these medications   albuterol 108 (90 Base) MCG/ACT inhaler Commonly known as:  PROVENTIL HFA;VENTOLIN HFA Inhale 2 puffs into the lungs every 4 (four) hours as needed for wheezing or shortness of breath.   amiodarone 200 MG tablet Commonly known as:  PACERONE Take 1 tablet (200 mg total) by mouth daily. What changed:  how much to take  how to take this  when to take this  additional instructions   apixaban 5 MG Tabs tablet Commonly known as:  ELIQUIS Take 1 tablet (5 mg total) by mouth 2 (two) times daily.   atorvastatin 10 MG tablet Commonly known as:  LIPITOR Take 10 mg by mouth daily at 6 PM.   budesonide-formoterol 160-4.5 MCG/ACT inhaler Commonly known as:  SYMBICORT Inhale 2 puffs into the lungs 2 (two) times daily.   calcium acetate 667 MG capsule Commonly known as:  PHOSLO Take 1,334 mg by mouth 3 (three) times daily with meals.   carvedilol 6.25 MG tablet Commonly known as:  COREG Take 1 tablet (6.25 mg total) by mouth 2 (two) times daily with a meal.   colchicine 0.6 MG tablet Take  0.6 mg by mouth daily as needed. Reported on 01/06/2016   febuxostat 40 MG tablet Commonly known as:  ULORIC Take 40 mg by mouth daily.   furosemide 20 MG tablet Commonly known as:  LASIX Take 80 mg by mouth daily.   losartan 25 MG tablet Commonly known as:  COZAAR Take 25 mg by mouth daily.   montelukast 10 MG tablet Commonly known as:  SINGULAIR Take 10 mg by mouth at bedtime.   multivitamin Tabs tablet Take 1 tablet by mouth daily.   pantoprazole 40 MG tablet Commonly known as:  PROTONIX Take 40 mg by mouth daily.   predniSONE 10 MG (21) Tbpk tablet Commonly known as:  STERAPRED UNI-PAK 21 TAB Take as prescribed   Vitamin D3 1000 units Caps Take 2,000 Units by mouth daily.   ZZZQUIL PO Take 25 mg by mouth at bedtime as needed.         Diet and Activity recommendation: See Discharge Instructions above   Consults obtained -nephro   Major procedures and Radiology Reports - PLEASE review detailed and final reports for all details, in brief -      Dg Chest 2 View  Result Date: 06/17/2017 CLINICAL DATA:  Pt had a fast heart rate today at dialysis. Hx of cardiac stents, pneumonia, bronchitis, asthma, hypertension, COPD, prostate cancer. Former smoker EXAM: CHEST  2 VIEW COMPARISON:  11/20/2016 FINDINGS: Cardiac silhouette is normal in size. No mediastinal or hilar masses. No evidence of adenopathy. Lungs are mildly  hyperexpanded but clear. No pleural effusion or pneumothorax. There is a mild wedge-shaped compression deformity at the thoracolumbar junction, stable from the prior exam. Bony thorax is demineralized but otherwise unremarkable. IMPRESSION: No active cardiopulmonary disease. Electronically Signed   By: Lajean Manes M.D.   On: 06/17/2017 15:24   Dg Chest Port 1 View  Result Date: 06/20/2017 CLINICAL DATA:  Inspiratory chest pain began yesterday. Recently hospitalized for rapid atrial fibrillation. History of COPD, hypertension, chronic renal  insufficiency, discontinued smoking in 2010. EXAM: PORTABLE CHEST 1 VIEW COMPARISON:  PA and lateral chest x-ray of June 17, 2017 FINDINGS: The lungs are adequately inflated. There is no focal infiltrate. The interstitial markings are coarse. The pulmonary vascularity is not engorged. The cardiac silhouette is enlarged but stable. The bony thorax exhibits no acute abnormality. IMPRESSION: COPD with chronic interstitial changes. No alveolar pneumonia, pneumothorax, pleural effusion, or pulmonary edema. Electronically Signed   By: David  Martinique M.D.   On: 06/20/2017 11:51    Micro Results     Recent Results (from the past 240 hour(s))  MRSA PCR Screening     Status: None   Collection Time: 06/17/17  8:24 PM  Result Value Ref Range Status   MRSA by PCR NEGATIVE NEGATIVE Final    Comment:        The GeneXpert MRSA Assay (FDA approved for NASAL specimens only), is one component of a comprehensive MRSA colonization surveillance program. It is not intended to diagnose MRSA infection nor to guide or monitor treatment for MRSA infections.   Culture, blood (routine x 2)     Status: None   Collection Time: 06/20/17 12:02 PM  Result Value Ref Range Status   Specimen Description BLOOD RIGHT ANTECUBITAL  Final   Special Requests   Final    BOTTLES DRAWN AEROBIC AND ANAEROBIC Blood Culture results may not be optimal due to an excessive volume of blood received in culture bottles   Culture NO GROWTH 5 DAYS  Final   Report Status 06/25/2017 FINAL  Final  Culture, blood (routine x 2)     Status: None   Collection Time: 06/20/17 12:03 PM  Result Value Ref Range Status   Specimen Description BLOOD BLOOD RIGHT ARM  Final   Special Requests   Final    BOTTLES DRAWN AEROBIC AND ANAEROBIC Blood Culture adequate volume   Culture NO GROWTH 5 DAYS  Final   Report Status 06/25/2017 FINAL  Final       Today   Subjective:   Bradly Bienenstock today has no headache,no chest abdominal pain,no new weakness  tingling or numbness, feels much better wants to go home today. *  Objective:   Blood pressure (!) 148/82, pulse 92, temperature 98 F (36.7 C), temperature source Oral, resp. rate 20, height 5\' 11"  (1.803 m), weight 111.9 kg (246 lb 11.1 oz), SpO2 93 %.  No intake or output data in the 24 hours ending 06/25/17 0715  Exam Awake Alert, Oriented x 3, No new F.N deficits, Normal affect .AT,PERRAL Supple Neck,No JVD, No cervical lymphadenopathy appriciated.  Symmetrical Chest wall movement, Good air movement bilaterally, CTAB RRR,No Gallops,Rubs or new Murmurs, No Parasternal Heave +ve B.Sounds, Abd Soft, Non tender, No organomegaly appriciated, No rebound -guarding or rigidity. No Cyanosis, Clubbing or edema, No new Rash or bruise  Data Review   CBC w Diff:  Lab Results  Component Value Date   WBC 17.0 (H) 06/21/2017   HGB 10.2 (L) 06/21/2017   HGB 12.5 (L)  07/25/2014   HCT 29.8 (L) 06/21/2017   HCT 39.7 (L) 07/25/2014   PLT 167 06/21/2017   PLT 200 07/25/2014   LYMPHOPCT 20 11/20/2016   MONOPCT 8 11/20/2016   EOSPCT 1 11/20/2016   BASOPCT 1 11/20/2016    CMP:  Lab Results  Component Value Date   NA 137 06/21/2017   NA 141 12/17/2014   K 3.8 06/21/2017   K 4.5 12/17/2014   CL 95 (L) 06/21/2017   CL 108 (H) 12/17/2014   CO2 30 06/21/2017   CO2 26 12/17/2014   BUN 46 (H) 06/21/2017   BUN 45 (H) 12/17/2014   CREATININE 5.99 (H) 06/21/2017   CREATININE 4.30 (H) 12/17/2014   PROT 7.5 06/20/2017   ALBUMIN 3.8 06/20/2017   BILITOT 1.3 (H) 06/20/2017   ALKPHOS 40 06/20/2017   AST 19 06/20/2017   ALT 21 06/20/2017  .   Total Time in preparing paper work, data evaluation and todays exam - 89 minutes  Magdalina Whitehead M.D on 06/25/2017 at 7:15 AM    Note: This dictation was prepared with Dragon dictation along with smaller phrase technology. Any transcriptional errors that result from this process are unintentional.

## 2017-07-26 ENCOUNTER — Encounter
Admission: RE | Disposition: A | Discharge status: Home / Self Care | Payer: Self-pay | Source: Ambulatory Visit | Attending: Internal Medicine

## 2017-07-26 ENCOUNTER — Ambulatory Visit: Payer: Medicare Other | Admitting: Anesthesiology

## 2017-07-26 ENCOUNTER — Ambulatory Visit
Admission: RE | Admit: 2017-07-26 | Discharge: 2017-07-26 | Disposition: A | Discharge status: Home / Self Care | Payer: Medicare Other | Source: Ambulatory Visit | Attending: Internal Medicine | Admitting: Internal Medicine

## 2017-07-26 DIAGNOSIS — E669 Obesity, unspecified: Secondary | ICD-10-CM | POA: Diagnosis not present

## 2017-07-26 DIAGNOSIS — Z6834 Body mass index (BMI) 34.0-34.9, adult: Secondary | ICD-10-CM | POA: Insufficient documentation

## 2017-07-26 DIAGNOSIS — I480 Paroxysmal atrial fibrillation: Secondary | ICD-10-CM | POA: Insufficient documentation

## 2017-07-26 DIAGNOSIS — Z955 Presence of coronary angioplasty implant and graft: Secondary | ICD-10-CM | POA: Diagnosis not present

## 2017-07-26 DIAGNOSIS — Z79899 Other long term (current) drug therapy: Secondary | ICD-10-CM | POA: Diagnosis not present

## 2017-07-26 DIAGNOSIS — Z87891 Personal history of nicotine dependence: Secondary | ICD-10-CM | POA: Diagnosis not present

## 2017-07-26 DIAGNOSIS — Z5309 Procedure and treatment not carried out because of other contraindication: Secondary | ICD-10-CM | POA: Insufficient documentation

## 2017-07-26 DIAGNOSIS — Z7901 Long term (current) use of anticoagulants: Secondary | ICD-10-CM | POA: Insufficient documentation

## 2017-07-26 DIAGNOSIS — I10 Essential (primary) hypertension: Secondary | ICD-10-CM | POA: Insufficient documentation

## 2017-07-26 DIAGNOSIS — J449 Chronic obstructive pulmonary disease, unspecified: Secondary | ICD-10-CM | POA: Insufficient documentation

## 2017-07-26 HISTORY — PX: CARDIOVERSION: EP1203

## 2017-07-26 LAB — BASIC METABOLIC PANEL
ANION GAP: 13 (ref 5–15)
BUN: 39 mg/dL — ABNORMAL HIGH (ref 6–20)
CALCIUM: 8.5 mg/dL — AB (ref 8.9–10.3)
CO2: 32 mmol/L (ref 22–32)
Chloride: 98 mmol/L — ABNORMAL LOW (ref 101–111)
Creatinine, Ser: 5.79 mg/dL — ABNORMAL HIGH (ref 0.61–1.24)
GFR, EST AFRICAN AMERICAN: 10 mL/min — AB (ref >60)
GFR, EST NON AFRICAN AMERICAN: 9 mL/min — AB (ref >60)
Glucose, Bld: 96 mg/dL (ref 65–99)
Potassium: 4.1 mmol/L (ref 3.5–5.1)
Sodium: 143 mmol/L (ref 135–145)

## 2017-07-26 LAB — PROTIME-INR
INR: 1.92
PROTHROMBIN TIME: 22.2 s — AB (ref 11.4–15.2)

## 2017-07-26 SURGERY — CARDIOVERSION (CATH LAB)
Anesthesia: General

## 2017-07-26 MED ORDER — PROPOFOL 10 MG/ML IV BOLUS
INTRAVENOUS | Status: AC
  Filled 2017-07-26: qty 20

## 2017-07-26 MED ORDER — SODIUM CHLORIDE 0.9 % IV SOLN: INTRAVENOUS | Status: DC

## 2017-07-26 MED ORDER — SODIUM CHLORIDE 0.9 % IV SOLN
INTRAVENOUS | Status: AC | PRN
  Administered 2017-07-26 – 2017-10-21 (×2): via INTRAVENOUS

## 2017-07-26 NOTE — Progress Notes (Signed)
Procedure cancelled due to low PT/INR. Office will reschedule.

## 2017-07-26 NOTE — Anesthesia Preprocedure Evaluation (Addendum)
Anesthesia Evaluation  Patient identified by MRN, date of birth, ID band Patient awake    Reviewed: Allergy & Precautions, NPO status , Patient's Chart, lab work & pertinent test results, reviewed documented beta blocker date and time   Airway Mallampati: III  TM Distance: >3 FB     Dental  (+) Chipped, Poor Dentition, Missing   Pulmonary shortness of breath, COPD, former smoker,           Cardiovascular hypertension, Pt. on medications and Pt. on home beta blockers + Cardiac Stents  + dysrhythmias Atrial Fibrillation      Neuro/Psych    GI/Hepatic GERD  Controlled,  Endo/Other    Renal/GU ESRFRenal disease     Musculoskeletal  (+) Arthritis ,   Abdominal   Peds  Hematology   Anesthesia Other Findings Obese.gout. Rbbb.  Reproductive/Obstetrics                            Anesthesia Physical Anesthesia Plan  ASA: III  Anesthesia Plan: General   Post-op Pain Management:    Induction: Intravenous  PONV Risk Score and Plan:   Airway Management Planned:   Additional Equipment:   Intra-op Plan:   Post-operative Plan:   Informed Consent: I have reviewed the patients History and Physical, chart, labs and discussed the procedure including the risks, benefits and alternatives for the proposed anesthesia with the patient or authorized representative who has indicated his/her understanding and acceptance.     Plan Discussed with: CRNA  Anesthesia Plan Comments:         Anesthesia Quick Evaluation

## 2017-08-18 ENCOUNTER — Encounter
Admission: RE | Disposition: A | Discharge status: Home / Self Care | Payer: Self-pay | Source: Ambulatory Visit | Attending: Internal Medicine

## 2017-08-18 ENCOUNTER — Ambulatory Visit: Payer: Medicare Other | Admitting: Anesthesiology

## 2017-08-18 ENCOUNTER — Ambulatory Visit
Admission: RE | Admit: 2017-08-18 | Discharge: 2017-08-18 | Disposition: A | Discharge status: Home / Self Care | Payer: Medicare Other | Source: Ambulatory Visit | Attending: Internal Medicine | Admitting: Internal Medicine

## 2017-08-18 ENCOUNTER — Encounter: Payer: Self-pay | Admitting: Anesthesiology

## 2017-08-18 DIAGNOSIS — Z79899 Other long term (current) drug therapy: Secondary | ICD-10-CM | POA: Diagnosis not present

## 2017-08-18 DIAGNOSIS — Z955 Presence of coronary angioplasty implant and graft: Secondary | ICD-10-CM | POA: Diagnosis not present

## 2017-08-18 DIAGNOSIS — Z87891 Personal history of nicotine dependence: Secondary | ICD-10-CM | POA: Insufficient documentation

## 2017-08-18 DIAGNOSIS — I4891 Unspecified atrial fibrillation: Secondary | ICD-10-CM | POA: Diagnosis not present

## 2017-08-18 DIAGNOSIS — G5730 Lesion of lateral popliteal nerve, unspecified lower limb: Secondary | ICD-10-CM | POA: Insufficient documentation

## 2017-08-18 DIAGNOSIS — Z9861 Coronary angioplasty status: Secondary | ICD-10-CM | POA: Diagnosis not present

## 2017-08-18 DIAGNOSIS — I120 Hypertensive chronic kidney disease with stage 5 chronic kidney disease or end stage renal disease: Secondary | ICD-10-CM | POA: Diagnosis not present

## 2017-08-18 DIAGNOSIS — M109 Gout, unspecified: Secondary | ICD-10-CM | POA: Diagnosis not present

## 2017-08-18 DIAGNOSIS — E790 Hyperuricemia without signs of inflammatory arthritis and tophaceous disease: Secondary | ICD-10-CM | POA: Insufficient documentation

## 2017-08-18 DIAGNOSIS — E559 Vitamin D deficiency, unspecified: Secondary | ICD-10-CM | POA: Insufficient documentation

## 2017-08-18 DIAGNOSIS — N189 Chronic kidney disease, unspecified: Secondary | ICD-10-CM | POA: Insufficient documentation

## 2017-08-18 DIAGNOSIS — B0229 Other postherpetic nervous system involvement: Secondary | ICD-10-CM | POA: Insufficient documentation

## 2017-08-18 DIAGNOSIS — I2510 Atherosclerotic heart disease of native coronary artery without angina pectoris: Secondary | ICD-10-CM | POA: Insufficient documentation

## 2017-08-18 DIAGNOSIS — M1990 Unspecified osteoarthritis, unspecified site: Secondary | ICD-10-CM | POA: Insufficient documentation

## 2017-08-18 DIAGNOSIS — I4510 Unspecified right bundle-branch block: Secondary | ICD-10-CM | POA: Diagnosis not present

## 2017-08-18 DIAGNOSIS — C61 Malignant neoplasm of prostate: Secondary | ICD-10-CM | POA: Diagnosis not present

## 2017-08-18 DIAGNOSIS — Z888 Allergy status to other drugs, medicaments and biological substances status: Secondary | ICD-10-CM | POA: Insufficient documentation

## 2017-08-18 DIAGNOSIS — N186 End stage renal disease: Secondary | ICD-10-CM | POA: Diagnosis not present

## 2017-08-18 DIAGNOSIS — Z881 Allergy status to other antibiotic agents status: Secondary | ICD-10-CM | POA: Insufficient documentation

## 2017-08-18 DIAGNOSIS — K219 Gastro-esophageal reflux disease without esophagitis: Secondary | ICD-10-CM | POA: Insufficient documentation

## 2017-08-18 DIAGNOSIS — Z7901 Long term (current) use of anticoagulants: Secondary | ICD-10-CM | POA: Insufficient documentation

## 2017-08-18 DIAGNOSIS — E669 Obesity, unspecified: Secondary | ICD-10-CM | POA: Diagnosis not present

## 2017-08-18 DIAGNOSIS — J449 Chronic obstructive pulmonary disease, unspecified: Secondary | ICD-10-CM | POA: Diagnosis not present

## 2017-08-18 HISTORY — PX: CARDIOVERSION: EP1203

## 2017-08-18 SURGERY — CARDIOVERSION (CATH LAB)
Anesthesia: General

## 2017-08-18 MED ORDER — PROPOFOL 10 MG/ML IV BOLUS
INTRAVENOUS | Status: AC
  Filled 2017-08-18: qty 20

## 2017-08-18 MED ORDER — PROPOFOL 10 MG/ML IV BOLUS
INTRAVENOUS | Status: DC | PRN
  Administered 2017-08-18: 50 mg via INTRAVENOUS

## 2017-08-18 MED ORDER — SODIUM CHLORIDE 0.9 % IV SOLN: INTRAVENOUS | Status: DC

## 2017-08-18 NOTE — Discharge Instructions (Signed)
Moderate Conscious Sedation, Adult, Care After °These instructions provide you with information about caring for yourself after your procedure. Your health care provider may also give you more specific instructions. Your treatment has been planned according to current medical practices, but problems sometimes occur. Call your health care provider if you have any problems or questions after your procedure. °What can I expect after the procedure? °After your procedure, it is common: °· To feel sleepy for several hours. °· To feel clumsy and have poor balance for several hours. °· To have poor judgment for several hours. °· To vomit if you eat too soon. ° °Follow these instructions at home: °For at least 24 hours after the procedure: ° °· Do not: °? Participate in activities where you could fall or become injured. °? Drive. °? Use heavy machinery. °? Drink alcohol. °? Take sleeping pills or medicines that cause drowsiness. °? Make important decisions or sign legal documents. °? Take care of children on your own. °· Rest. °Eating and drinking °· Follow the diet recommended by your health care provider. °· If you vomit: °? Drink water, juice, or soup when you can drink without vomiting. °? Make sure you have little or no nausea before eating solid foods. °General instructions °· Have a responsible adult stay with you until you are awake and alert. °· Take over-the-counter and prescription medicines only as told by your health care provider. °· If you smoke, do not smoke without supervision. °· Keep all follow-up visits as told by your health care provider. This is important. °Contact a health care provider if: °· You keep feeling nauseous or you keep vomiting. °· You feel light-headed. °· You develop a rash. °· You have a fever. °Get help right away if: °· You have trouble breathing. °This information is not intended to replace advice given to you by your health care provider. Make sure you discuss any questions you have  with your health care provider. °Document Released: 10/03/2013 Document Revised: 05/17/2016 Document Reviewed: 04/03/2016 °Elsevier Interactive Patient Education © 2018 [employer] °Electrical Cardioversion, Care After °This sheet gives you information about how to care for yourself after your procedure. Your health care provider may also give you more specific instructions. If you have problems or questions, contact your health care provider. °What can I expect after the procedure? °After the procedure, it is common to have: °· Some redness on the skin where the shocks were given. ° °Follow these instructions at home: °· Do not drive for 24 hours if you were given a medicine to help you relax (sedative). °· Take over-the-counter and prescription medicines only as told by your health care provider. °· Ask your health care provider how to check your pulse. Check it often. °· Rest for 48 hours after the procedure or as told by your health care provider. °· Avoid or limit your caffeine use as told by your health care provider. °Contact a health care provider if: °· You feel like your heart is beating too quickly or your pulse is not regular. °· You have a serious muscle cramp that does not go away. °Get help right away if: °· You have discomfort in your chest. °· You are dizzy or you feel faint. °· You have trouble breathing or you are short of breath. °· Your speech is slurred. °· You have trouble moving an arm or leg on one side of your body. °· Your fingers or toes turn cold or blue. °This information is not intended   to replace advice given to you by your health care provider. Make sure you discuss any questions you have with your health care provider. °Document Released: 10/03/2013 Document Revised: 07/16/2016 Document Reviewed: 06/18/2016 °Elsevier Interactive Patient Education © 2018 [employer] ° °

## 2017-08-18 NOTE — Anesthesia Procedure Notes (Signed)
Date/Time: 08/18/2017 7:22 AM Performed by: Nelda Marseille Pre-anesthesia Checklist: Patient identified, Emergency Drugs available, Suction available, Patient being monitored and Timeout performed Oxygen Delivery Method: Nasal cannula

## 2017-08-18 NOTE — Anesthesia Postprocedure Evaluation (Signed)
Anesthesia Post Note  Patient: Maurice Peterson  Procedure(s) Performed: Procedure(s) (LRB): CARDIOVERSION (N/A)  Patient location during evaluation: Cath Lab Anesthesia Type: General Level of consciousness: awake and alert Pain management: pain level controlled Vital Signs Assessment: post-procedure vital signs reviewed and stable Respiratory status: spontaneous breathing, nonlabored ventilation, respiratory function stable and patient connected to nasal cannula oxygen Cardiovascular status: blood pressure returned to baseline and stable Postop Assessment: no signs of nausea or vomiting Anesthetic complications: no     Last Vitals:  Vitals:   08/18/17 0815 08/18/17 0823  BP: 96/75 96/75  Pulse: 81 76  Resp: (!) 29 (!) 23  Temp:    SpO2: 98% 98%    Last Pain:  Vitals:   08/18/17 0654  TempSrc: Oral                 [name]

## 2017-08-18 NOTE — CV Procedure (Signed)
Electrical Cardioversion Procedure Note CADYN FANN [id number] 1941-02-09  Procedure: Electrical Cardioversion Indications:  Atrial Fibrillation  Procedure Details Consent: Risks of procedure as well as the alternatives and risks of each were explained to the (patient/caregiver).  Consent for procedure obtained. Time Out: Verified patient identification, verified procedure, site/side was marked, verified correct patient position, special equipment/implants available, medications/allergies/relevent history reviewed, required imaging and test results available.  Performed  Patient placed on cardiac monitor, pulse oximetry, supplemental oxygen as necessary.  Sedation given: Benzodiazepines and Short-acting barbiturates Pacer pads placed anterior and posterior chest.  Cardioverted 1 time(s).  Cardioverted at 120J.  Evaluation Findings: Post procedure EKG shows: NSR Complications: None Patient did tolerate procedure well.   Corey Skains 08/18/2017, 7:46 AM

## 2017-08-18 NOTE — Anesthesia Post-op Follow-up Note (Signed)
Anesthesia QCDR form completed.        

## 2017-08-18 NOTE — Transfer of Care (Signed)
Immediate Anesthesia Transfer of Care Note  Patient: Maurice Peterson  Procedure(s) Performed: Procedure(s): CARDIOVERSION (N/A)  Patient Location: PACU  Anesthesia Type:General  Level of Consciousness: awake  Airway & Oxygen Therapy: Patient Spontanous Breathing and Patient connected to nasal cannula oxygen  Post-op Assessment: Report given to RN and Post -op Vital signs reviewed and stable  Post vital signs: Reviewed and stable  Last Vitals:  Vitals:   08/18/17 0746 08/18/17 0747  BP:  91/65  Pulse: 85 84  Resp: (!) 25 (!) 27  Temp:    SpO2: 96% 95%    Last Pain:  Vitals:   08/18/17 0654  TempSrc: Oral         Complications: No apparent anesthesia complications

## 2017-08-18 NOTE — Anesthesia Preprocedure Evaluation (Addendum)
Anesthesia Evaluation  Patient identified by MRN, date of birth, ID band Patient awake    Reviewed: Allergy & Precautions, NPO status , Patient's Chart, lab work & pertinent test results, reviewed documented beta blocker date and time   Airway Mallampati: III  TM Distance: >3 FB     Dental  (+) Chipped   Pulmonary shortness of breath, COPD, former smoker,           Cardiovascular hypertension, Pt. on medications and Pt. on home beta blockers + Cardiac Stents       Neuro/Psych    GI/Hepatic GERD  Controlled,  Endo/Other    Renal/GU ESRFRenal disease     Musculoskeletal  (+) Arthritis ,   Abdominal   Peds  Hematology   Anesthesia Other Findings Gout. EKG shows AF and Rbbb.  Reproductive/Obstetrics                            Anesthesia Physical Anesthesia Plan  ASA: III  Anesthesia Plan: General   Post-op Pain Management:    Induction: Intravenous  PONV Risk Score and Plan:   Airway Management Planned:   Additional Equipment:   Intra-op Plan:   Post-operative Plan:   Informed Consent: I have reviewed the patients History and Physical, chart, labs and discussed the procedure including the risks, benefits and alternatives for the proposed anesthesia with the patient or authorized representative who has indicated his/her understanding and acceptance.     Plan Discussed with: CRNA  Anesthesia Plan Comments:         Anesthesia Quick Evaluation

## 2017-08-31 DIAGNOSIS — I952 Hypotension due to drugs: Secondary | ICD-10-CM | POA: Insufficient documentation

## 2017-09-12 ENCOUNTER — Emergency Department
Admission: EM | Admit: 2017-09-12 | Discharge: 2017-09-12 | Disposition: A | Discharge status: Home / Self Care | Payer: Medicare Other | Attending: Emergency Medicine | Admitting: Emergency Medicine

## 2017-09-12 ENCOUNTER — Encounter: Payer: Self-pay | Admitting: Emergency Medicine

## 2017-09-12 DIAGNOSIS — Z992 Dependence on renal dialysis: Secondary | ICD-10-CM | POA: Diagnosis not present

## 2017-09-12 DIAGNOSIS — Z87891 Personal history of nicotine dependence: Secondary | ICD-10-CM | POA: Insufficient documentation

## 2017-09-12 DIAGNOSIS — I120 Hypertensive chronic kidney disease with stage 5 chronic kidney disease or end stage renal disease: Secondary | ICD-10-CM | POA: Insufficient documentation

## 2017-09-12 DIAGNOSIS — R21 Rash and other nonspecific skin eruption: Secondary | ICD-10-CM

## 2017-09-12 DIAGNOSIS — J449 Chronic obstructive pulmonary disease, unspecified: Secondary | ICD-10-CM | POA: Insufficient documentation

## 2017-09-12 DIAGNOSIS — Z79899 Other long term (current) drug therapy: Secondary | ICD-10-CM | POA: Insufficient documentation

## 2017-09-12 DIAGNOSIS — Z7901 Long term (current) use of anticoagulants: Secondary | ICD-10-CM | POA: Insufficient documentation

## 2017-09-12 DIAGNOSIS — N186 End stage renal disease: Secondary | ICD-10-CM | POA: Diagnosis not present

## 2017-09-12 LAB — CBC
HCT: 40.2 % (ref 40.0–52.0)
Hemoglobin: 13.4 g/dL (ref 13.0–18.0)
MCH: 30.8 pg (ref 26.0–34.0)
MCHC: 33.4 g/dL (ref 32.0–36.0)
MCV: 92.4 fL (ref 80.0–100.0)
PLATELETS: 184 10*3/uL (ref 150–440)
RBC: 4.35 MIL/uL — ABNORMAL LOW (ref 4.40–5.90)
RDW: 15.6 % — ABNORMAL HIGH (ref 11.5–14.5)
WBC: 12.6 10*3/uL — AB (ref 3.8–10.6)

## 2017-09-12 LAB — COMPREHENSIVE METABOLIC PANEL
ALT: 23 U/L (ref 17–63)
ANION GAP: 12 (ref 5–15)
AST: 22 U/L (ref 15–41)
Albumin: 4.3 g/dL (ref 3.5–5.0)
Alkaline Phosphatase: 46 U/L (ref 38–126)
BUN: 17 mg/dL (ref 6–20)
CHLORIDE: 94 mmol/L — AB (ref 101–111)
CO2: 32 mmol/L (ref 22–32)
CREATININE: 4.17 mg/dL — AB (ref 0.61–1.24)
Calcium: 8.7 mg/dL — ABNORMAL LOW (ref 8.9–10.3)
GFR, EST AFRICAN AMERICAN: 15 mL/min — AB (ref >60)
GFR, EST NON AFRICAN AMERICAN: 13 mL/min — AB (ref >60)
Glucose, Bld: 108 mg/dL — ABNORMAL HIGH (ref 65–99)
Potassium: 3.6 mmol/L (ref 3.5–5.1)
SODIUM: 138 mmol/L (ref 135–145)
Total Bilirubin: 1 mg/dL (ref 0.3–1.2)
Total Protein: 8.4 g/dL — ABNORMAL HIGH (ref 6.5–8.1)

## 2017-09-12 NOTE — ED Triage Notes (Signed)
Has rash that itches on lower extremities for about 1 week.  Says he had this about 20 yr ago and it was infection in blood.  [facility] went to [facility] and [facility] sent him [facility].  No fever, no pain.

## 2017-09-12 NOTE — ED Provider Notes (Signed)
[facility] Emergency Department Provider Note   ____________________________________________   I have reviewed the triage vital signs and the nursing notes.   HISTORY  Chief Complaint Rash   History limited by: Not Limited   HPI Maurice Peterson is a 76 y.o. male who presents to the emergency department today because of concerns for rash. The patient states that the rash is located in his bilateral feet. It started about a week ago. The rash is itchy. The patient denies any associated fevers, nausea or vomiting. Denies any chest pain or abdominal pain. He states he had a similar rash roughly 20 years ago. 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 Patient is on dialysis.   Past Medical History:  Diagnosis Date  . Arthritis   . Chronic kidney disease    requires dialysis  . COPD (chronic obstructive pulmonary disease) ([place])   . GERD (gastroesophageal reflux disease)   . Hyperlipidemia   . Hypertension   . Prostate cancer ([place])   . Shortness of breath dyspnea     Patient Active Problem List   Diagnosis Date Noted  . Respiratory failure ([place]) 06/20/2017  . COPD exacerbation ([place]) 06/20/2017  . Atrial flutter with rapid ventricular response ([place]) 06/17/2017  . End stage renal disease ([place]) 11/08/2016  . Complication of vascular access for dialysis 11/08/2016  . Essential hypertension, benign 11/08/2016  . Hypercholesterolemia 11/08/2016    Past Surgical History:  Procedure Laterality Date  . CARDIOVERSION N/A 07/26/2017   Procedure: Cardioversion;  Surgeon: Corey Skains, MD;  Location: [facility];  Service: Cardiovascular;  Laterality: N/A;  . CARDIOVERSION N/A 08/18/2017   Procedure: CARDIOVERSION;  Surgeon: Corey Skains, MD;  Location: [facility];  Service: Cardiovascular;  Laterality: N/A;  . CATARACT EXTRACTION W/PHACO Right 08/20/2015   Procedure: CATARACT EXTRACTION PHACO AND INTRAOCULAR LENS PLACEMENT ([place]);  Surgeon: Leandrew Koyanagi, MD;  Location: [place];  Service: Ophthalmology;  Laterality: Right;  . CATARACT EXTRACTION W/PHACO Left 09/10/2015   Procedure: CATARACT EXTRACTION PHACO AND INTRAOCULAR LENS PLACEMENT (IOC);  Surgeon: Leandrew Koyanagi, MD;  Location: [place];  Service: Ophthalmology;  Laterality: Left;  . CORONARY STENT PLACEMENT    . 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 PERIPHERAL VASCULAR CATHETERIZATION Left 05/20/2015   Procedure: A/V Shuntogram/Fistulagram;  Surgeon: Katha Cabal, MD;  Location: [place] [facility];  Service: Cardiovascular;  Laterality: Left;  . PERIPHERAL VASCULAR CATHETERIZATION N/A 05/20/2015   Procedure: A/V Shunt Intervention;  Surgeon: Katha Cabal, MD;  Location: [place] [facility];  Service: Cardiovascular;  Laterality: N/A;  . PERIPHERAL VASCULAR CATHETERIZATION Left 08/05/2015   Procedure: A/V Shuntogram/Fistulagram;  Surgeon: Katha Cabal, MD;  Location: [place] [facility];  Service: Cardiovascular;  Laterality: Left;  . PERIPHERAL VASCULAR CATHETERIZATION N/A 08/05/2015   Procedure: A/V Shunt Intervention;  Surgeon: Katha Cabal, MD;  Location: [place] [facility];  Service: Cardiovascular;  Laterality: N/A;  . PERIPHERAL VASCULAR CATHETERIZATION Left 01/06/2016   Procedure: A/V Shuntogram/Fistulagram;  Surgeon: Katha Cabal, MD;  Location: [place] [facility];  Service: Cardiovascular;  Laterality: Left;  . PERIPHERAL VASCULAR CATHETERIZATION N/A 01/06/2016   Procedure: A/V Shunt Intervention;  Surgeon: Katha Cabal, MD;  Location: [place] [facility];  Service: Cardiovascular;  Laterality: N/A;  . PERIPHERAL VASCULAR CATHETERIZATION N/A 12/13/2016   Procedure: Dialysis/Perma Catheter Removal;  Surgeon: Algernon Huxley, MD;  Location: [place]  [facility];  Service: Cardiovascular;  Laterality: N/A;    Prior to Admission medications   Medication Sig Start Date End Date Taking? Authorizing  Provider  albuterol (PROVENTIL HFA;VENTOLIN HFA) 108 (90 BASE) MCG/ACT inhaler Inhale 2 puffs into the lungs every 4 (four) hours as needed for wheezing or shortness of breath.    [provider]  amiodarone (PACERONE) 200 MG tablet Take 1 tablet (200 mg total) by mouth daily. Patient taking differently: Take 400 mg by mouth daily.  06/22/17   Epifanio Lesches, MD  atorvastatin (LIPITOR) 10 MG tablet Take 10 mg by mouth daily at 6 PM.     [provider]  budesonide-formoterol (SYMBICORT) 160-4.5 MCG/ACT inhaler Inhale 2 puffs into the lungs 2 (two) times daily.    [provider]  calcium acetate (PHOSLO) 667 MG capsule Take 1,334 mg by mouth 3 (three) times daily with meals.    [provider]  carvedilol (COREG) 6.25 MG tablet Take 1 tablet (6.25 mg total) by mouth 2 (two) times daily with a meal. 06/19/17   Sainani, Belia Heman, MD  Cholecalciferol (VITAMIN D3) 2000 units TABS Take 2,000 Units by mouth daily.     [provider]  colchicine 0.6 MG tablet Take 0.6 mg by mouth daily as needed. Reported on 01/06/2016    [provider]  DiphenhydrAMINE HCl, Sleep, (ZZZQUIL PO) Take 25 mg by mouth at bedtime as needed.    [provider]  febuxostat (ULORIC) 40 MG tablet Take 40 mg by mouth daily.     [provider]  furosemide (LASIX) 20 MG tablet Take 80 mg by mouth daily.     [provider]  losartan (COZAAR) 25 MG tablet Take 12.5 mg by mouth daily.     [provider]  montelukast (SINGULAIR) 10 MG tablet Take 10 mg by mouth daily.     [provider]  multivitamin (RENA-VIT) TABS tablet Take 1 tablet by mouth daily.    [provider]  pantoprazole (PROTONIX) 40 MG tablet Take 40 mg by mouth daily.    [provider]  warfarin (COUMADIN) 3 MG tablet Take 3 mg by mouth daily at 6 PM.     [provider]    Allergies Ace inhibitors; Amoxicillin-pot clavulanate; and  Other  Family History  Problem Relation Age of Onset  . Liver disease Mother   . Heart disease Mother   . Hypertension Mother   . Pancreatic cancer Father     Social History Social History  Substance Use Topics  . Smoking status: Former Smoker    Packs/day: 1.00    Years: 30.00    Types: Cigarettes    Quit date: 05/19/2009  . Smokeless tobacco: Never Used  . Alcohol use No    Review of Systems Constitutional: No fever/chills Eyes: No visual changes. ENT: No sore throat. Cardiovascular: Denies chest pain. Respiratory: Denies shortness of breath. Gastrointestinal: No abdominal pain.  No nausea, no vomiting.  No diarrhea.   Genitourinary: Negative for dysuria. Musculoskeletal: Negative for back pain. Skin: positive for rash Neurological: Negative for headaches, focal weakness or numbness.  ____________________________________________   PHYSICAL EXAM:  VITAL SIGNS: ED Triage Vitals  Enc Vitals Group     BP 09/12/17 1318 108/73     Pulse Rate 09/12/17 1318 (!) 103     Resp 09/12/17 1318 16     Temp 09/12/17 1318 98.7 F (37.1 C)     Temp Source 09/12/17 1318 Oral  SpO2 09/12/17 1318 98 %     Weight 09/12/17 1319 235 lb (106.6 kg)     Height 09/12/17 1319 5\' 9"  (1.753 m)   Constitutional: Alert and oriented. Well appearing and in no distress. Eyes: Conjunctivae are normal.  ENT   Head: Normocephalic and atraumatic.   Nose: No congestion/rhinnorhea.   Mouth/Throat: Mucous membranes are moist.   Neck: No stridor. Hematological/Lymphatic/Immunilogical: No cervical lymphadenopathy. Cardiovascular: Normal rate, regular rhythm.  No murmurs, rubs, or gallops.  Respiratory: Normal respiratory effort without tachypnea nor retractions. Breath sounds are clear and equal bilaterally. No wheezes/rales/rhonchi. Gastrointestinal: Soft and non tender. No rebound. No guarding.  Genitourinary: Deferred Musculoskeletal: Normal range of motion in all extremities.  No lower extremity edema. Neurologic:  Normal speech and language. No gross focal neurologic deficits are appreciated.  Skin: palpable purpura of the lower extremities Psychiatric: Mood and affect are normal. Speech and behavior are normal. Patient exhibits appropriate insight and judgment.  ____________________________________________    LABS (pertinent positives/negatives)  Labs Reviewed  COMPREHENSIVE METABOLIC PANEL - Abnormal; Notable for the following:       Result Value   Chloride 94 (*)    Glucose, Bld 108 (*)    Creatinine, Ser 4.17 (*)    Calcium 8.7 (*)    Total Protein 8.4 (*)    GFR calc non Af Amer 13 (*)    GFR calc Af Amer 15 (*)    All other components within normal limits  CBC - Abnormal; Notable for the following:    WBC 12.6 (*)    RBC 4.35 (*)    RDW 15.6 (*)    All other components within normal limits     ____________________________________________   EKG  None  ____________________________________________    RADIOLOGY  None  ____________________________________________   PROCEDURES  Procedures  ____________________________________________   INITIAL IMPRESSION / ASSESSMENT AND PLAN / ED COURSE  Pertinent labs & imaging results that were available during my care of the patient were reviewed by me and considered in my medical decision making (see chart for details).  patient presented to the emergency department today because of concerns for a rash. Exam does show some palpable purpuric. This point I do think vasculitis unlikely. Patient states he already has a follow-up appointment with dermatology scheduled. Patient is dialysis patient. We will discharge. Did recommend Benadryl for itchiness.  ____________________________________________   FINAL CLINICAL IMPRESSION(S) / ED DIAGNOSES  Final diagnoses:  Rash     Note: This dictation was prepared with Dragon dictation. Any transcriptional errors that result from this process  are unintentional     Nance Pear, MD 09/12/17 1749

## 2017-09-12 NOTE — Discharge Instructions (Signed)
Please seek medical attention for any high fevers, chest pain, shortness of breath, change in behavior, persistent vomiting, bloody stool or any other new or concerning symptoms.  

## 2017-10-18 ENCOUNTER — Encounter: Payer: Self-pay | Admitting: Internal Medicine

## 2017-10-18 ENCOUNTER — Ambulatory Visit (INDEPENDENT_AMBULATORY_CARE_PROVIDER_SITE_OTHER): Payer: Medicare Other | Admitting: Internal Medicine

## 2017-10-18 ENCOUNTER — Telehealth: Payer: Self-pay | Admitting: *Deleted

## 2017-10-18 VITALS — BP 100/78 | HR 128 | Ht 70.0 in | Wt 250.5 lb

## 2017-10-18 DIAGNOSIS — I10 Essential (primary) hypertension: Secondary | ICD-10-CM

## 2017-10-18 DIAGNOSIS — I4892 Unspecified atrial flutter: Secondary | ICD-10-CM | POA: Diagnosis not present

## 2017-10-18 DIAGNOSIS — Z01810 Encounter for preprocedural cardiovascular examination: Secondary | ICD-10-CM | POA: Diagnosis not present

## 2017-10-18 MED ORDER — AMIODARONE HCL 200 MG PO TABS: ORAL_TABLET | ORAL | 0 refills | Status: DC

## 2017-10-18 NOTE — Progress Notes (Signed)
ELECTROPHYSIOLOGY CONSULT NOTE  Patient ID: Maurice Peterson, MRN: [id number], DOB/AGE: 01-May-1941 76 y.o. Admit date: (Not on file) Date of Consult: 10/18/2017  Primary Physician: Madelyn Brunner, MD Primary Cardiologist: BK Lupita Leash     Maurice Peterson is a 76 y.o. male who is being seen today for the evaluation of Atrial Fib at the request of [facility] [place].    HPI Maurice Peterson is a 76 y.o. male  Seen for atrial flutter/fibrillation. This first became manifest 6/18. Was discharged on amiodarone and warfarin The patient had undergone cardioversion with significant improvement in symptoms. When seen 9/18 he was holding sinus rhythm. On return 10/18 he had reverted to atrial fibrillation/flutter and his antiarrhythmics were discontinued. He has noted significant worsening in his exercise tolerance. He is unaware of his tachycardia.  He has a history of coronary artery disease with prior inferior wall MI. Echocardiogram 11/17 EF 35% with a left atrial dimension of 3.8 cm  Myoview scan 11/17 EF 46% with normal perfusion (? 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 He is on hemodialysis.  Past Medical History:  Diagnosis Date  . Arthritis   . Chronic kidney disease    requires dialysis  . COPD (chronic obstructive pulmonary disease) ([place])   . GERD (gastroesophageal reflux disease)   . Hyperlipidemia   . Hypertension   . Prostate cancer ([place])   . Shortness of breath dyspnea       Surgical History:  Past Surgical History:  Procedure Laterality Date  . CARDIOVERSION N/A 07/26/2017   Procedure: Cardioversion;  Surgeon: Corey Skains, MD;  Location: [facility];  Service: Cardiovascular;  Laterality: N/A;  . CARDIOVERSION N/A 08/18/2017   Procedure: CARDIOVERSION;  Surgeon: Corey Skains, MD;  Location: [facility];  Service: Cardiovascular;  Laterality: N/A;  . CATARACT  EXTRACTION W/PHACO Right 08/20/2015   Procedure: CATARACT EXTRACTION PHACO AND INTRAOCULAR LENS PLACEMENT ([place]);  Surgeon: Leandrew Koyanagi, MD;  Location: [place];  Service: Ophthalmology;  Laterality: Right;  . CATARACT EXTRACTION W/PHACO Left 09/10/2015   Procedure: CATARACT EXTRACTION PHACO AND INTRAOCULAR LENS PLACEMENT (I[facility]);  Surgeon: Leandrew Koyanagi, MD;  Location: [place];  Service: Ophthalmology;  Laterality: Left;  . CORONARY STENT PLACEMENT    . 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 PERIPHERAL VASCULAR CATHETERIZATION Left 05/20/2015   Procedure: A/V Shuntogram/Fistulagram;  Surgeon: Katha Cabal, MD;  Location: [place] [facility];  Service: Cardiovascular;  Laterality: Left;  . PERIPHERAL VASCULAR CATHETERIZATION N/A 05/20/2015   Procedure: A/V Shunt Intervention;  Surgeon: Katha Cabal, MD;  Location: [place] [facility];  Service: Cardiovascular;  Laterality: N/A;  . PERIPHERAL VASCULAR CATHETERIZATION Left 08/05/2015   Procedure: A/V Shuntogram/Fistulagram;  Surgeon: Katha Cabal, MD;  Location: [place] [facility];  Service: Cardiovascular;  Laterality: Left;  . PERIPHERAL VASCULAR CATHETERIZATION N/A 08/05/2015   Procedure: A/V Shunt Intervention;  Surgeon: Katha Cabal, MD;  Location: [place] [facility];  Service: Cardiovascular;  Laterality: N/A;  . PERIPHERAL VASCULAR CATHETERIZATION Left 01/06/2016   Procedure: A/V Shuntogram/Fistulagram;  Surgeon: Katha Cabal, MD;  Location: [place] [facility];  Service: Cardiovascular;  Laterality: Left;  . PERIPHERAL VASCULAR CATHETERIZATION N/A 01/06/2016  Procedure: A/V Shunt Intervention;  Surgeon: Katha Cabal, MD;  Location: [place] [facility];  Service: Cardiovascular;  Laterality: N/A;  . PERIPHERAL VASCULAR CATHETERIZATION N/A 12/13/2016   Procedure: Dialysis/Perma Catheter Removal;  Surgeon: Algernon Huxley, MD;  Location: [place] [facility];  Service:  Cardiovascular;  Laterality: N/A;     Home Meds: Prior to Admission medications   Medication Sig Start Date End Date Taking? Authorizing Provider  albuterol (PROVENTIL HFA;VENTOLIN HFA) 108 (90 BASE) MCG/ACT inhaler Inhale 2 puffs into the lungs every 4 (four) hours as needed for wheezing or shortness of breath.   Yes [provider]  atorvastatin (LIPITOR) 10 MG tablet Take 10 mg by mouth daily at 6 PM.    Yes [provider]  calcium acetate (PHOSLO) 667 MG capsule Take 1,334 mg by mouth 3 (three) times daily with meals.   Yes [provider]  carvedilol (COREG) 6.25 MG tablet Take 1 tablet (6.25 mg total) by mouth 2 (two) times daily with a meal. 06/19/17  Yes Sainani, Belia Heman, MD  Cholecalciferol (VITAMIN D3) 2000 units TABS Take 2,000 Units by mouth daily.    Yes [provider]  DiphenhydrAMINE HCl, Sleep, (ZZZQUIL PO) Take 25 mg by mouth at bedtime as needed.   Yes [provider]  febuxostat (ULORIC) 40 MG tablet Take 40 mg by mouth daily.    Yes [provider]  furosemide (LASIX) 20 MG tablet Take 80 mg by mouth daily.    Yes [provider]  montelukast (SINGULAIR) 10 MG tablet Take 10 mg by mouth daily.    Yes [provider]  multivitamin (RENA-VIT) TABS tablet Take 1 tablet by mouth daily.   Yes [provider]  pantoprazole (PROTONIX) 40 MG tablet Take 40 mg by mouth daily.   Yes [provider]  predniSONE (DELTASONE) 10 MG tablet Take 30 mg x 1 week, take 20 mg x 1 week, take 10 mg x 1 week, then off 10/11/17  Yes [provider]  warfarin (COUMADIN) 3 MG tablet Take 3 mg by mouth daily at 6 PM.    Yes [provider]    Allergies:  Allergies  Allergen Reactions  . Ace Inhibitors Nausea And Vomiting  . Amoxicillin-Pot Clavulanate Nausea And Vomiting  . Other Nausea And Vomiting    Anti-inflammatories    Social History   Social History  . Marital status: Married      Spouse name: N/A  . Number of children: N/A  . Years of education: N/A   Occupational History  . Not on file.   Social History Main Topics  . Smoking status: Former Smoker    Packs/day: 1.00    Years: 30.00    Types: Cigarettes    Quit date: 05/19/2009  . Smokeless tobacco: Never Used  . Alcohol use No  . Drug use: No  . Sexual activity: Yes   Other Topics Concern  . Not on file   Social History Narrative  . No narrative on file     Family History  Problem Relation Age of Onset  . Liver disease Mother   . Heart disease Mother   . Hypertension Mother   . Pancreatic cancer Father      ROS:  Please see the history of present illness.     All other systems reviewed and negative.    Physical Exam:  Blood pressure 100/78, pulse (!) 128, height 5\' 10"  (1.778 m), weight 250 lb 8  oz (113.6 kg). General: Well developed, well nourished male in no acute distress. Head: Normocephalic, atraumatic, sclera non-icteric, no xanthomas, nares are without discharge. EENT: normal  Lymph Nodes:  none Neck: Negative for carotid bruits. JVD not elevated. Back:without scoliosis kyphosis  Lungs: Clear bilaterally to auscultation without wheezes, rales, or rhonchi. Breathing is unlabored. Heart: RRR with S1 S2. No   murmur . No rubs, or gallops appreciated. Abdomen: Soft, non-tender, non-distended with normoactive bowel sounds. No hepatomegaly. No rebound/guarding. No obvious abdominal masses. Msk:  Strength and tone appear normal for age. Extremities: No clubbing or cyanosis. TR edema.  Distal pedal pulses are 2+ and equal bilaterally. Skin: Warm and Dry Neuro: Alert and oriented X 3. CN III-XII intact Grossly normal sensory and motor function . Psych:  Responds to questions appropriately with a normal affect.      Labs: Cardiac Enzymes No results for input(s): CKTOTAL, CKMB, TROPONINI in the last 72 hours. CBC Lab Results  Component Value Date   WBC 12.6 (H) 09/12/2017   HGB  13.4 09/12/2017   HCT 40.2 09/12/2017   MCV 92.4 09/12/2017   PLT 184 09/12/2017   PROTIME: No results for input(s): LABPROT, INR in the last 72 hours. Chemistry No results for input(s): NA, K, CL, CO2, BUN, CREATININE, CALCIUM, PROT, BILITOT, ALKPHOS, ALT, AST, GLUCOSE in the last 168 hours.  Invalid input(s): LABALBU Lipids No results found for: CHOL, HDL, LDLCALC, TRIG BNP No results found for: PROBNP Thyroid Function Tests: No results for input(s): TSH, T4TOTAL, T3FREE, THYROIDAB in the last 72 hours.  Invalid input(s): FREET3 Miscellaneous No results found for: DDIMER  Radiology/Studies:  No results found.  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 Atrial size is surprisingly small and I think amiodarone may work to restore sinus rhythm in conjunction with cardioversion. Adjunctive ranolazine may be useful. His atrial size is sufficiently small that he may also be a candidate for catheter ablation.  At this juncture, we have decided to resume amiodarone. 400 twice a day 2 weeks and then 400 daily for a month. This will certainly impact his INR. Would anticipate cardioversion in 7-10 days.  He is not urinating at this point I will discontinue his Lasix.      Virl Axe

## 2017-10-18 NOTE — Telephone Encounter (Signed)
Cardioversion scheduled for 10/25/17 at 0730, arrival time of 06:30 to [facility]. Patient received pre-procedural instructions at office today. Patient notified and verbalized understanding.

## 2017-10-18 NOTE — Patient Instructions (Addendum)
Medication Instructions:  Your physician has recommended you make the following change in your medication:  1- STOP TAKING Furosemide. 2- START Amiodarone 400 mg (2 tablets) by mouth twice a day for 2 weeks, then take 400 mg (2 tablets) by mouth once a day for 1 month.   Labwork: Your physician recommends that you return for lab work in: [place].   Testing/Procedures: Your physician has recommended that you have a Cardioversion (DCCV). Electrical Cardioversion uses a jolt of electricity to your heart either through paddles or wired patches attached to your chest. This is a controlled, usually prescheduled, procedure. Defibrillation is done under light anesthesia in the hospital, and you usually go home the day of the procedure. This is done to get your heart back into a normal rhythm. You are not awake for the procedure. Please see the instruction sheet given to you today.  You are scheduled for a Cardioversion on _10/30/18____ with Dr.___END___ Please arrive at the [facility] at _06:30__ a.m. on the day of your procedure.  DIET INSTRUCTIONS:  Nothing to eat or drink after midnight except your medications with a small  sip of water.         1) Labs: __TODAY_  2) Medications:  YOU MAY TAKE ALL of your remaining medications with a small amount of water.  3) Must have a responsible person to drive you home.  4) Bring a current list of your medications and current insurance cards.    If you have any questions after you get home, please call the office at [phone]   Follow-Up: Your physician recommends that you schedule a follow-up appointment in: 3-4 [place].  Your physician recommends that you schedule a follow-up appointment in: WITH COUMADIN CLINIC. NEW PATIENT.     If you need a refill on your cardiac medications before your next appointment, please call your pharmacy.     Electrical Cardioversion Electrical cardioversion is the delivery of a jolt  of electricity to restore a normal rhythm to the heart. A rhythm that is too fast or is not regular keeps the heart from pumping well. In this procedure, sticky patches or metal paddles are placed on the chest to deliver electricity to the heart from a device. This procedure may be done in an emergency if:  There is low or no blood pressure as a result of the heart rhythm.  Normal rhythm must be restored as fast as possible to protect the brain and heart from further damage.  It may save a life.  This procedure may also be done for irregular or fast heart rhythms that are not immediately life-threatening. Tell a health care provider about:  Any allergies you have.  All medicines you are taking, including vitamins, herbs, eye drops, creams, and over-the-counter medicines.  Any problems you or family members have had with anesthetic medicines.  Any blood disorders you have.  Any surgeries you have had.  Any medical conditions you have.  Whether you are pregnant or may be pregnant. What are the risks? Generally, this is a safe procedure. However, problems may occur, including:  Allergic reactions to medicines.  A blood clot that breaks free and travels to other parts of your body.  The possible return of an abnormal heart rhythm within hours or days after the procedure.  Your heart stopping (cardiac arrest). This is rare.  What happens before the procedure? Medicines  Your health care provider may have you start taking: ? Blood-thinning  medicines (anticoagulants) so your blood does not clot as easily. ? Medicines may be given to help stabilize your heart rate and rhythm.  Ask your health care provider about changing or stopping your regular medicines. This is especially important if you are taking diabetes medicines or blood thinners. General instructions  Plan to have someone take you home from the hospital or clinic.  If you will be going home right after the  procedure, plan to have someone with you for 24 hours.  Follow instructions from your health care provider about eating or drinking restrictions. What happens during the procedure?  To lower your risk of infection: ? Your health care team will wash or sanitize their hands. ? Your skin will be washed with soap.  An IV tube will be inserted into one of your veins.  You will be given a medicine to help you relax (sedative).  Sticky patches (electrodes) or metal paddles may be placed on your chest.  An electrical shock will be delivered. The procedure may vary among health care providers and hospitals. What happens after the procedure?  Your blood pressure, heart rate, breathing rate, and blood oxygen level will be monitored until the medicines you were given have worn off.  Do not drive for 24 hours if you were given a sedative.  Your heart rhythm will be watched to make sure it does not change. This information is not intended to replace advice given to you by your health care provider. Make sure you discuss any questions you have with your health care provider. Document Released: 12/03/2002 Document Revised: 08/11/2016 Document Reviewed: 06/18/2016 Elsevier Interactive Patient Education  2017 [employer].

## 2017-10-19 ENCOUNTER — Emergency Department: Payer: Medicare Other

## 2017-10-19 ENCOUNTER — Inpatient Hospital Stay
Admission: EM | Admit: 2017-10-19 | Discharge: 2017-10-26 | DRG: 252 | Disposition: A | Discharge status: Home / Self Care | Payer: Medicare Other | Attending: Internal Medicine | Admitting: Internal Medicine

## 2017-10-19 ENCOUNTER — Encounter: Payer: Self-pay | Admitting: Emergency Medicine

## 2017-10-19 DIAGNOSIS — E7800 Pure hypercholesterolemia, unspecified: Secondary | ICD-10-CM | POA: Diagnosis present

## 2017-10-19 DIAGNOSIS — Z87891 Personal history of nicotine dependence: Secondary | ICD-10-CM

## 2017-10-19 DIAGNOSIS — R0781 Pleurodynia: Secondary | ICD-10-CM | POA: Diagnosis not present

## 2017-10-19 DIAGNOSIS — I132 Hypertensive heart and chronic kidney disease with heart failure and with stage 5 chronic kidney disease, or end stage renal disease: Secondary | ICD-10-CM | POA: Diagnosis present

## 2017-10-19 DIAGNOSIS — Y712 Prosthetic and other implants, materials and accessory cardiovascular devices associated with adverse incidents: Secondary | ICD-10-CM | POA: Diagnosis present

## 2017-10-19 DIAGNOSIS — Z992 Dependence on renal dialysis: Secondary | ICD-10-CM

## 2017-10-19 DIAGNOSIS — Z7901 Long term (current) use of anticoagulants: Secondary | ICD-10-CM

## 2017-10-19 DIAGNOSIS — I2510 Atherosclerotic heart disease of native coronary artery without angina pectoris: Secondary | ICD-10-CM | POA: Diagnosis present

## 2017-10-19 DIAGNOSIS — Z6837 Body mass index (BMI) 37.0-37.9, adult: Secondary | ICD-10-CM | POA: Diagnosis not present

## 2017-10-19 DIAGNOSIS — Z888 Allergy status to other drugs, medicaments and biological substances status: Secondary | ICD-10-CM

## 2017-10-19 DIAGNOSIS — E785 Hyperlipidemia, unspecified: Secondary | ICD-10-CM | POA: Diagnosis present

## 2017-10-19 DIAGNOSIS — K219 Gastro-esophageal reflux disease without esophagitis: Secondary | ICD-10-CM | POA: Diagnosis present

## 2017-10-19 DIAGNOSIS — Z955 Presence of coronary angioplasty implant and graft: Secondary | ICD-10-CM | POA: Diagnosis not present

## 2017-10-19 DIAGNOSIS — I4892 Unspecified atrial flutter: Principal | ICD-10-CM | POA: Diagnosis present

## 2017-10-19 DIAGNOSIS — Z8249 Family history of ischemic heart disease and other diseases of the circulatory system: Secondary | ICD-10-CM

## 2017-10-19 DIAGNOSIS — T82510A Breakdown (mechanical) of surgically created arteriovenous fistula, initial encounter: Secondary | ICD-10-CM | POA: Diagnosis present

## 2017-10-19 DIAGNOSIS — I4891 Unspecified atrial fibrillation: Secondary | ICD-10-CM | POA: Diagnosis present

## 2017-10-19 DIAGNOSIS — A419 Sepsis, unspecified organism: Secondary | ICD-10-CM | POA: Diagnosis present

## 2017-10-19 DIAGNOSIS — N2581 Secondary hyperparathyroidism of renal origin: Secondary | ICD-10-CM | POA: Diagnosis present

## 2017-10-19 DIAGNOSIS — E669 Obesity, unspecified: Secondary | ICD-10-CM | POA: Diagnosis present

## 2017-10-19 DIAGNOSIS — I252 Old myocardial infarction: Secondary | ICD-10-CM

## 2017-10-19 DIAGNOSIS — I5022 Chronic systolic (congestive) heart failure: Secondary | ICD-10-CM | POA: Diagnosis present

## 2017-10-19 DIAGNOSIS — D631 Anemia in chronic kidney disease: Secondary | ICD-10-CM | POA: Diagnosis present

## 2017-10-19 DIAGNOSIS — Z8546 Personal history of malignant neoplasm of prostate: Secondary | ICD-10-CM | POA: Diagnosis not present

## 2017-10-19 DIAGNOSIS — Z881 Allergy status to other antibiotic agents status: Secondary | ICD-10-CM

## 2017-10-19 DIAGNOSIS — M1990 Unspecified osteoarthritis, unspecified site: Secondary | ICD-10-CM | POA: Diagnosis present

## 2017-10-19 DIAGNOSIS — I484 Atypical atrial flutter: Secondary | ICD-10-CM | POA: Diagnosis not present

## 2017-10-19 DIAGNOSIS — Z800 Family history of malignant neoplasm of digestive organs: Secondary | ICD-10-CM

## 2017-10-19 DIAGNOSIS — N186 End stage renal disease: Secondary | ICD-10-CM | POA: Diagnosis present

## 2017-10-19 DIAGNOSIS — N028 Recurrent and persistent hematuria with other morphologic changes: Secondary | ICD-10-CM | POA: Diagnosis present

## 2017-10-19 DIAGNOSIS — J449 Chronic obstructive pulmonary disease, unspecified: Secondary | ICD-10-CM | POA: Diagnosis present

## 2017-10-19 DIAGNOSIS — I959 Hypotension, unspecified: Secondary | ICD-10-CM | POA: Diagnosis not present

## 2017-10-19 DIAGNOSIS — Z9109 Other allergy status, other than to drugs and biological substances: Secondary | ICD-10-CM

## 2017-10-19 DIAGNOSIS — Z79899 Other long term (current) drug therapy: Secondary | ICD-10-CM

## 2017-10-19 DIAGNOSIS — I483 Typical atrial flutter: Secondary | ICD-10-CM | POA: Diagnosis not present

## 2017-10-19 DIAGNOSIS — I255 Ischemic cardiomyopathy: Secondary | ICD-10-CM | POA: Diagnosis not present

## 2017-10-19 DIAGNOSIS — J441 Chronic obstructive pulmonary disease with (acute) exacerbation: Secondary | ICD-10-CM

## 2017-10-19 DIAGNOSIS — T82868A Thrombosis of vascular prosthetic devices, implants and grafts, initial encounter: Secondary | ICD-10-CM | POA: Diagnosis not present

## 2017-10-19 HISTORY — DX: Ischemic cardiomyopathy: I25.5

## 2017-10-19 HISTORY — DX: End stage renal disease: Z99.2

## 2017-10-19 HISTORY — DX: Atherosclerotic heart disease of native coronary artery without angina pectoris: I25.10

## 2017-10-19 HISTORY — DX: End stage renal disease: N18.6

## 2017-10-19 HISTORY — DX: Unspecified atrial flutter: I48.92

## 2017-10-19 LAB — CBC WITH DIFFERENTIAL/PLATELET
BASOS ABS: 0.2 10*3/uL — AB (ref 0–0.1)
BASOS PCT: 1 %
BASOS: 0 %
Basophils Absolute: 0 10*3/uL (ref 0.0–0.2)
EOS (ABSOLUTE): 0 10*3/uL (ref 0.0–0.4)
EOS: 0 %
Eosinophils Absolute: 0 10*3/uL (ref 0–0.7)
Eosinophils Relative: 0 %
HEMATOCRIT: 37.2 % — AB (ref 37.5–51.0)
HEMATOCRIT: 39.1 % — AB (ref 40.0–52.0)
HEMOGLOBIN: 12 g/dL — AB (ref 13.0–17.7)
HEMOGLOBIN: 12.4 g/dL — AB (ref 13.0–18.0)
IMMATURE GRANS (ABS): 0.4 10*3/uL — AB (ref 0.0–0.1)
IMMATURE GRANULOCYTES: 2 %
LYMPHS: 7 %
Lymphocytes Absolute: 1.6 10*3/uL (ref 0.7–3.1)
Lymphocytes Relative: 4 %
Lymphs Abs: 0.7 10*3/uL — ABNORMAL LOW (ref 1.0–3.6)
MCH: 30 pg (ref 26.6–33.0)
MCH: 29.9 pg (ref 26.0–34.0)
MCHC: 32.3 g/dL (ref 31.5–35.7)
MCHC: 31.8 g/dL — ABNORMAL LOW (ref 32.0–36.0)
MCV: 93 fL (ref 79–97)
MCV: 93.9 fL (ref 80.0–100.0)
MONOCYTES: 3 %
Monocytes Absolute: 0.7 10*3/uL (ref 0.1–0.9)
Monocytes Absolute: 1.3 10*3/uL — ABNORMAL HIGH (ref 0.2–1.0)
Monocytes Relative: 7 %
NEUTROS ABS: 19.6 10*3/uL — AB (ref 1.4–7.0)
NEUTROS ABS: 18.2 10*3/uL — AB (ref 1.4–6.5)
NEUTROS PCT: 88 %
NEUTROS PCT: 88 %
PLATELETS: 159 10*3/uL (ref 150–379)
Platelets: 186 10*3/uL (ref 150–440)
RBC: 4 x10E6/uL — ABNORMAL LOW (ref 4.14–5.80)
RBC: 4.17 MIL/uL — ABNORMAL LOW (ref 4.40–5.90)
RDW: 16.3 % — ABNORMAL HIGH (ref 12.3–15.4)
RDW: 17.7 % — AB (ref 11.5–14.5)
WBC: 22.3 10*3/uL (ref 3.4–10.8)
WBC: 20.4 10*3/uL — ABNORMAL HIGH (ref 3.8–10.6)

## 2017-10-19 LAB — BASIC METABOLIC PANEL
BUN/Creatinine Ratio: 10 (ref 10–24)
BUN: 57 mg/dL — ABNORMAL HIGH (ref 8–27)
CO2: 25 mmol/L (ref 20–29)
CREATININE: 5.8 mg/dL — AB (ref 0.76–1.27)
Calcium: 8.2 mg/dL — ABNORMAL LOW (ref 8.6–10.2)
Chloride: 94 mmol/L — ABNORMAL LOW (ref 96–106)
GFR, EST AFRICAN AMERICAN: 10 mL/min/{1.73_m2} — AB (ref >59)
GFR, EST NON AFRICAN AMERICAN: 9 mL/min/{1.73_m2} — AB (ref >59)
Glucose: 97 mg/dL (ref 65–99)
Potassium: 5.2 mmol/L (ref 3.5–5.2)
Sodium: 141 mmol/L (ref 134–144)

## 2017-10-19 LAB — COMPREHENSIVE METABOLIC PANEL
ALBUMIN: 3.7 g/dL (ref 3.5–5.0)
ALT: 24 U/L (ref 17–63)
AST: 16 U/L (ref 15–41)
Alkaline Phosphatase: 45 U/L (ref 38–126)
Anion gap: 14 (ref 5–15)
BILIRUBIN TOTAL: 1.1 mg/dL (ref 0.3–1.2)
BUN: 35 mg/dL — AB (ref 6–20)
CALCIUM: 8 mg/dL — AB (ref 8.9–10.3)
CO2: 27 mmol/L (ref 22–32)
Chloride: 95 mmol/L — ABNORMAL LOW (ref 101–111)
Creatinine, Ser: 4.09 mg/dL — ABNORMAL HIGH (ref 0.61–1.24)
GFR calc Af Amer: 15 mL/min — ABNORMAL LOW (ref >60)
GFR calc non Af Amer: 13 mL/min — ABNORMAL LOW (ref >60)
GLUCOSE: 108 mg/dL — AB (ref 65–99)
POTASSIUM: 4.1 mmol/L (ref 3.5–5.1)
SODIUM: 136 mmol/L (ref 135–145)
TOTAL PROTEIN: 6.9 g/dL (ref 6.5–8.1)

## 2017-10-19 LAB — TYPE AND SCREEN
ABO/RH(D): O POS
ANTIBODY SCREEN: NEGATIVE

## 2017-10-19 LAB — HEPATIC FUNCTION PANEL
ALBUMIN: 4.2 g/dL (ref 3.5–4.8)
ALT: 22 IU/L (ref 0–44)
AST: 9 IU/L (ref 0–40)
Alkaline Phosphatase: 65 IU/L (ref 39–117)
BILIRUBIN TOTAL: 0.5 mg/dL (ref 0.0–1.2)
Bilirubin, Direct: 0.18 mg/dL (ref 0.00–0.40)
TOTAL PROTEIN: 6.5 g/dL (ref 6.0–8.5)

## 2017-10-19 LAB — TROPONIN I
TROPONIN I: 0.05 ng/mL — AB (ref <0.03)
TROPONIN I: 0.05 ng/mL — AB (ref <0.03)

## 2017-10-19 LAB — TSH: TSH: 9.25 u[IU]/mL — ABNORMAL HIGH (ref 0.450–4.500)

## 2017-10-19 LAB — LIPASE, BLOOD: Lipase: 26 U/L (ref 11–51)

## 2017-10-19 LAB — LACTIC ACID, PLASMA
LACTIC ACID, VENOUS: 1.8 mmol/L (ref 0.5–1.9)
Lactic Acid, Venous: 1 mmol/L (ref 0.5–1.9)

## 2017-10-19 LAB — PROTIME-INR
INR: 3.2 — ABNORMAL HIGH (ref 0.8–1.2)
INR: 2.57
Prothrombin Time: 30.8 s — ABNORMAL HIGH (ref 9.1–12.0)
Prothrombin Time: 27.4 seconds — ABNORMAL HIGH (ref 11.4–15.2)

## 2017-10-19 LAB — GLUCOSE, CAPILLARY: Glucose-Capillary: 126 mg/dL — ABNORMAL HIGH (ref 65–99)

## 2017-10-19 LAB — MRSA PCR SCREENING: MRSA by PCR: NEGATIVE

## 2017-10-19 LAB — INFLUENZA PANEL BY PCR (TYPE A & B)
INFLAPCR: NEGATIVE
Influenza B By PCR: NEGATIVE

## 2017-10-19 LAB — APTT: APTT: 37 s — AB (ref 24–36)

## 2017-10-19 MED ORDER — DM-GUAIFENESIN ER 30-600 MG PO TB12: 1.0000 | ORAL_TABLET | Freq: Every evening | ORAL | Status: DC | PRN

## 2017-10-19 MED ORDER — VITAMIN D 1000 UNITS PO TABS
2000.0000 [IU] | ORAL_TABLET | Freq: Every day | ORAL | Status: DC
  Administered 2017-10-20 – 2017-10-26 (×7): 2000 [IU] via ORAL
  Filled 2017-10-19 (×8): qty 2

## 2017-10-19 MED ORDER — LEVALBUTEROL HCL 1.25 MG/3ML IN NEBU
1.2500 mg | INHALATION_SOLUTION | Freq: Once | RESPIRATORY_TRACT | Status: DC
  Filled 2017-10-19: qty 3
  Filled 2017-10-19: qty 0.5

## 2017-10-19 MED ORDER — PANTOPRAZOLE SODIUM 40 MG PO TBEC
40.0000 mg | DELAYED_RELEASE_TABLET | Freq: Every day | ORAL | Status: DC
  Administered 2017-10-20 – 2017-10-26 (×7): 40 mg via ORAL
  Filled 2017-10-19 (×8): qty 1

## 2017-10-19 MED ORDER — DOCUSATE SODIUM 100 MG PO CAPS
100.0000 mg | ORAL_CAPSULE | Freq: Two times a day (BID) | ORAL | Status: DC
  Administered 2017-10-19 – 2017-10-25 (×7): 100 mg via ORAL
  Filled 2017-10-19 (×12): qty 1

## 2017-10-19 MED ORDER — SODIUM CHLORIDE 0.9 % IV BOLUS (SEPSIS)
250.0000 mL | Freq: Once | INTRAVENOUS | Status: AC
  Administered 2017-10-19: 250 mL via INTRAVENOUS

## 2017-10-19 MED ORDER — LEVALBUTEROL HCL 1.25 MG/3ML IN NEBU
1.2500 mg | INHALATION_SOLUTION | Freq: Once | RESPIRATORY_TRACT | Status: AC
  Administered 2017-10-19: 1.25 mg via RESPIRATORY_TRACT
  Filled 2017-10-19: qty 0.5
  Filled 2017-10-19: qty 3

## 2017-10-19 MED ORDER — VANCOMYCIN HCL IN DEXTROSE 1-5 GM/200ML-% IV SOLN
1000.0000 mg | INTRAVENOUS | Status: AC
  Administered 2017-10-19: 1000 mg via INTRAVENOUS
  Filled 2017-10-19: qty 200

## 2017-10-19 MED ORDER — FEBUXOSTAT 40 MG PO TABS
40.0000 mg | ORAL_TABLET | Freq: Every evening | ORAL | Status: DC
  Administered 2017-10-20 – 2017-10-25 (×6): 40 mg via ORAL
  Filled 2017-10-19 (×8): qty 1

## 2017-10-19 MED ORDER — PIPERACILLIN-TAZOBACTAM 3.375 G IVPB: 3.3750 g | Freq: Two times a day (BID) | INTRAVENOUS | Status: DC

## 2017-10-19 MED ORDER — CALCIUM ACETATE 667 MG PO CAPS
1334.0000 mg | ORAL_CAPSULE | Freq: Three times a day (TID) | ORAL | Status: DC
  Administered 2017-10-20 – 2017-10-23 (×9): 1334 mg via ORAL
  Filled 2017-10-19 (×21): qty 2

## 2017-10-19 MED ORDER — DEXTROMETHORPHAN POLISTIREX ER 30 MG/5ML PO SUER
30.0000 mg | Freq: Every evening | ORAL | Status: DC | PRN
  Filled 2017-10-19: qty 5

## 2017-10-19 MED ORDER — ONDANSETRON HCL 4 MG/2ML IJ SOLN: 4.0000 mg | Freq: Four times a day (QID) | INTRAMUSCULAR | Status: DC | PRN

## 2017-10-19 MED ORDER — HYDROCODONE-ACETAMINOPHEN 5-325 MG PO TABS: 1.0000 | ORAL_TABLET | ORAL | Status: DC | PRN

## 2017-10-19 MED ORDER — ONDANSETRON HCL 4 MG PO TABS: 4.0000 mg | ORAL_TABLET | Freq: Four times a day (QID) | ORAL | Status: DC | PRN

## 2017-10-19 MED ORDER — VANCOMYCIN HCL IN DEXTROSE 1-5 GM/200ML-% IV SOLN: 1000.0000 mg | Freq: Once | INTRAVENOUS | Status: DC

## 2017-10-19 MED ORDER — ACETAMINOPHEN 500 MG PO TABS: 1000.0000 mg | ORAL_TABLET | Freq: Every day | ORAL | Status: DC | PRN

## 2017-10-19 MED ORDER — SODIUM CHLORIDE 0.9 % IV BOLUS (SEPSIS): 500.0000 mL | Freq: Once | INTRAVENOUS | Status: DC

## 2017-10-19 MED ORDER — SODIUM CHLORIDE 0.9 % IV BOLUS (SEPSIS): 1000.0000 mL | Freq: Once | INTRAVENOUS | Status: DC

## 2017-10-19 MED ORDER — MONTELUKAST SODIUM 10 MG PO TABS
10.0000 mg | ORAL_TABLET | Freq: Every evening | ORAL | Status: DC
  Administered 2017-10-20 – 2017-10-25 (×6): 10 mg via ORAL
  Filled 2017-10-19 (×6): qty 1

## 2017-10-19 MED ORDER — METHYLPREDNISOLONE SODIUM SUCC 125 MG IJ SOLR
125.0000 mg | Freq: Once | INTRAMUSCULAR | Status: AC
  Administered 2017-10-19: 125 mg via INTRAVENOUS
  Filled 2017-10-19: qty 2

## 2017-10-19 MED ORDER — UMECLIDINIUM-VILANTEROL 62.5-25 MCG/INH IN AEPB
1.0000 | INHALATION_SPRAY | Freq: Every day | RESPIRATORY_TRACT | Status: DC
  Administered 2017-10-21 – 2017-10-26 (×6): 1 via RESPIRATORY_TRACT
  Filled 2017-10-19: qty 14

## 2017-10-19 MED ORDER — VANCOMYCIN HCL IN DEXTROSE 1-5 GM/200ML-% IV SOLN
1000.0000 mg | Freq: Once | INTRAVENOUS | Status: AC
  Administered 2017-10-19: 1000 mg via INTRAVENOUS
  Filled 2017-10-19: qty 200

## 2017-10-19 MED ORDER — WARFARIN SODIUM 3 MG PO TABS
3.0000 mg | ORAL_TABLET | Freq: Every evening | ORAL | Status: DC
  Administered 2017-10-20 – 2017-10-21 (×2): 3 mg via ORAL
  Filled 2017-10-19 (×3): qty 1

## 2017-10-19 MED ORDER — AMIODARONE HCL 200 MG PO TABS
400.0000 mg | ORAL_TABLET | Freq: Two times a day (BID) | ORAL | Status: DC
  Administered 2017-10-19 – 2017-10-20 (×2): 400 mg via ORAL
  Filled 2017-10-19 (×2): qty 2

## 2017-10-19 MED ORDER — ATORVASTATIN CALCIUM 10 MG PO TABS
10.0000 mg | ORAL_TABLET | Freq: Every evening | ORAL | Status: DC
  Administered 2017-10-20 – 2017-10-25 (×6): 10 mg via ORAL
  Filled 2017-10-19 (×6): qty 1

## 2017-10-19 MED ORDER — NOREPINEPHRINE BITARTRATE 1 MG/ML IV SOLN: 5.0000 ug/min | INTRAVENOUS | Status: DC

## 2017-10-19 MED ORDER — RENA-VITE PO TABS
1.0000 | ORAL_TABLET | Freq: Every day | ORAL | Status: DC
  Administered 2017-10-20 – 2017-10-26 (×7): 1 via ORAL
  Filled 2017-10-19 (×7): qty 1

## 2017-10-19 MED ORDER — PIPERACILLIN-TAZOBACTAM 3.375 G IVPB 30 MIN
3.3750 g | Freq: Once | INTRAVENOUS | Status: AC
  Administered 2017-10-19: 3.375 g via INTRAVENOUS
  Filled 2017-10-19: qty 50

## 2017-10-19 MED ORDER — BISACODYL 5 MG PO TBEC: 5.0000 mg | DELAYED_RELEASE_TABLET | Freq: Every day | ORAL | Status: DC | PRN

## 2017-10-19 MED ORDER — LEVALBUTEROL HCL 1.25 MG/0.5ML IN NEBU
INHALATION_SOLUTION | RESPIRATORY_TRACT | Status: AC
  Administered 2017-10-19: 1.25 mg
  Filled 2017-10-19: qty 0.5

## 2017-10-19 MED ORDER — IOPAMIDOL (ISOVUE-370) INJECTION 76%
75.0000 mL | Freq: Once | INTRAVENOUS | Status: AC | PRN
Start: 2017-10-19 — End: 2017-10-19
  Administered 2017-10-19: 75 mL via INTRAVENOUS

## 2017-10-19 MED ORDER — ALBUTEROL SULFATE (2.5 MG/3ML) 0.083% IN NEBU
2.5000 mg | INHALATION_SOLUTION | RESPIRATORY_TRACT | Status: DC | PRN
  Administered 2017-10-21 – 2017-10-23 (×3): 2.5 mg via RESPIRATORY_TRACT
  Filled 2017-10-19 (×3): qty 3

## 2017-10-19 MED ORDER — TRAZODONE HCL 50 MG PO TABS: 25.0000 mg | ORAL_TABLET | Freq: Every evening | ORAL | Status: DC | PRN

## 2017-10-19 MED ORDER — GUAIFENESIN ER 600 MG PO TB12: 600.0000 mg | ORAL_TABLET | Freq: Every evening | ORAL | Status: DC | PRN

## 2017-10-19 MED ORDER — WARFARIN - PHARMACIST DOSING INPATIENT: Freq: Every day | Status: DC

## 2017-10-19 MED ORDER — KETOROLAC TROMETHAMINE 30 MG/ML IJ SOLN: 15.0000 mg | Freq: Four times a day (QID) | INTRAMUSCULAR | Status: AC | PRN

## 2017-10-19 MED ORDER — DEXTROSE 5 % IV SOLN
1.0000 g | Freq: Once | INTRAVENOUS | Status: AC
  Administered 2017-10-19: 1 g via INTRAVENOUS
  Filled 2017-10-19: qty 1

## 2017-10-19 NOTE — H&P (Signed)
[facility] NAME: Maurice Peterson    MR#:  [id number]  DATE OF BIRTH:  1941-08-24  DATE OF ADMISSION:  10/19/2017  PRIMARY CARE PHYSICIAN: Madelyn Brunner, MD   REQUESTING/REFERRING PHYSICIAN: Rudene Re, MD  CHIEF COMPLAINT:   Chief Complaint  Patient presents with  . Chest Pain  . Shortness of Breath    HISTORY OF PRESENT ILLNESS:  Maurice Peterson  is a 76 y.o. male with a known history of ESRD on HD (MWF), atrial flutter on Coumadin, COPD, hypertension and hyperlipidemia who presents for evaluation of chest pain and shortness of breath. Patient reports that he has had a dry cough and intermittent wheezing. Has been using his albuterol since yesterday. Went to dialysis today had a full treatment. When he arrived home he developed chest pain that he describes as sharp, 7 out of 10, located in the center of his chest, pleuritic. Patient is currently on a month along the prednisone taper for vasculitis (follows at [facility]). He is tachycardic, tachypneic, hypotensive with concern for sepsis or underlying heart dz. He is being admitted for further eval and mgmt. CT chest neg for PE PAST MEDICAL HISTORY:   Past Medical History:  Diagnosis Date  . Arthritis   . Chronic kidney disease    requires dialysis  . COPD (chronic obstructive pulmonary disease) ([place])   . GERD (gastroesophageal reflux disease)   . Hyperlipidemia   . Hypertension   . Prostate cancer ([place])   . Shortness of breath dyspnea     PAST SURGICAL HISTORY:   Past Surgical History:  Procedure Laterality Date  . CARDIOVERSION N/A 07/26/2017   Procedure: Cardioversion;  Surgeon: Corey Skains, MD;  Location: [facility] ORS;  Service: Cardiovascular;  Laterality: N/A;  . CARDIOVERSION N/A 08/18/2017   Procedure: CARDIOVERSION;  Surgeon: Corey Skains, MD;  Location: [facility];  Service: Cardiovascular;  Laterality: N/A;  . CATARACT EXTRACTION W/PHACO Right 08/20/2015     Procedure: CATARACT EXTRACTION PHACO AND INTRAOCULAR LENS PLACEMENT ([place]);  Surgeon: Leandrew Koyanagi, MD;  Location: [facility];  Service: Ophthalmology;  Laterality: Right;  . CATARACT EXTRACTION W/PHACO Left 09/10/2015   Procedure: CATARACT EXTRACTION PHACO AND INTRAOCULAR LENS PLACEMENT ([facility]);  Surgeon: Leandrew Koyanagi, MD;  Location: [facility];  Service: Ophthalmology;  Laterality: Left;  . CORONARY STENT PLACEMENT    . 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 PERIPHERAL VASCULAR CATHETERIZATION Left 05/20/2015   Procedure: A/V Shuntogram/Fistulagram;  Surgeon: Katha Cabal, MD;  Location: [place] [facility];  Service: Cardiovascular;  Laterality: Left;  . PERIPHERAL VASCULAR CATHETERIZATION N/A 05/20/2015   Procedure: A/V Shunt Intervention;  Surgeon: Katha Cabal, MD;  Location: [place] [facility];  Service: Cardiovascular;  Laterality: N/A;  . PERIPHERAL VASCULAR CATHETERIZATION Left 08/05/2015   Procedure: A/V Shuntogram/Fistulagram;  Surgeon: Katha Cabal, MD;  Location: [facility] [facility];  Service: Cardiovascular;  Laterality: Left;  . PERIPHERAL VASCULAR CATHETERIZATION N/A 08/05/2015   Procedure: A/V Shunt Intervention;  Surgeon: Katha Cabal, MD;  Location: [place] [facility];  Service: Cardiovascular;  Laterality: N/A;  . PERIPHERAL VASCULAR CATHETERIZATION Left 01/06/2016   Procedure: A/V Shuntogram/Fistulagram;  Surgeon: Katha Cabal, MD;  Location: [place] [facility];  Service: Cardiovascular;  Laterality: Left;  . PERIPHERAL VASCULAR CATHETERIZATION N/A 01/06/2016   Procedure: A/V Shunt Intervention;  Surgeon: Katha Cabal, MD;  Location: [facility] INVASIVE CV  LAB;  Service: Cardiovascular;  Laterality: N/A;  . PERIPHERAL VASCULAR CATHETERIZATION N/A 12/13/2016   Procedure: Dialysis/Perma Catheter Removal;  Surgeon: Algernon Huxley, MD;  Location: [place] [facility];  Service: Cardiovascular;  Laterality: N/A;     SOCIAL HISTORY:   Social History  Substance Use Topics  . Smoking status: Former Smoker    Packs/day: 1.00    Years: 30.00    Types: Cigarettes    Quit date: 05/19/2009  . Smokeless tobacco: Never Used  . Alcohol use No    FAMILY HISTORY:   Family History  Problem Relation Age of Onset  . Liver disease Mother   . Heart disease Mother   . Hypertension Mother   . Pancreatic cancer Father     DRUG ALLERGIES:   Allergies  Allergen Reactions  . Ace Inhibitors Nausea And Vomiting  . Amoxicillin-Pot Clavulanate Nausea And Vomiting       . Other Nausea And Vomiting    Anti-inflammatories    REVIEW OF SYSTEMS:   Review of Systems  Constitutional: Positive for malaise/fatigue. Negative for chills, fever and weight loss.  HENT: Negative for nosebleeds and sore throat.   Eyes: Negative for blurred vision.  Respiratory: Positive for shortness of breath. Negative for cough and wheezing.   Cardiovascular: Positive for chest pain. Negative for orthopnea, leg swelling and PND.  Gastrointestinal: Negative for abdominal pain, constipation, diarrhea, heartburn, nausea and vomiting.  Genitourinary: Negative for dysuria and urgency.  Musculoskeletal: Negative for back pain.  Skin: Negative for rash.  Neurological: Positive for weakness. Negative for dizziness, speech change, focal weakness and headaches.  Endo/Heme/Allergies: Does not bruise/bleed easily.  Psychiatric/Behavioral: Negative for depression.   MEDICATIONS AT HOME:   Prior to Admission medications   Medication Sig Start Date End Date Taking? Authorizing Provider  amiodarone (PACERONE) 200 MG tablet Take 400 mg (2 tab) by mouth twice a day for 2 weeks, then taken 400 mg (2 tab) by mouth once a day for 1 month. 10/18/17  Yes Deboraha Sprang, MD  atorvastatin (LIPITOR) 10 MG tablet Take 10 mg by mouth every evening.    Yes [provider]  calcium acetate (PHOSLO) 667 MG capsule Take 1,334 mg by mouth 3  (three) times daily with meals.   Yes [provider]  carvedilol (COREG) 6.25 MG tablet Take 1 tablet (6.25 mg total) by mouth 2 (two) times daily with a meal. 06/19/17  Yes Sainani, Belia Heman, MD  Cholecalciferol (VITAMIN D3) 2000 units TABS Take 2,000 Units by mouth daily.    Yes [provider]  febuxostat (ULORIC) 40 MG tablet Take 40 mg by mouth every evening.    Yes [provider]  furosemide (LASIX) 80 MG tablet Take 80 mg by mouth daily.   Yes [provider]  montelukast (SINGULAIR) 10 MG tablet Take 10 mg by mouth every evening.    Yes [provider]  multivitamin (RENA-VIT) TABS tablet Take 1 tablet by mouth daily.   Yes [provider]  pantoprazole (PROTONIX) 40 MG tablet Take 40 mg by mouth daily.   Yes [provider]  predniSONE (DELTASONE) 10 MG tablet Take 30 mg x 1 week, take 20 mg x 1 week, take 10 mg x 1 week, then off 10/11/17  Yes [provider]  umeclidinium-vilanterol (ANORO ELLIPTA) 62.5-25 MCG/INH AEPB Inhale 1 puff into the lungs daily.   Yes [provider]  warfarin (COUMADIN) 3 MG tablet Take 3 mg by mouth every evening.  Yes [provider]  acetaminophen (TYLENOL) 500 MG tablet Take 1,000 mg by mouth daily as needed for moderate pain or headache.    [provider]  albuterol (PROVENTIL HFA;VENTOLIN HFA) 108 (90 BASE) MCG/ACT inhaler Inhale 2 puffs into the lungs every 4 (four) hours as needed for wheezing or shortness of breath.    [provider]  azelastine (OPTIVAR) 0.05 % ophthalmic solution Place 1 drop into both eyes 2 (two) times daily as needed (allergies).    [provider]  dextromethorphan-guaiFENesin (MUCINEX DM) 30-600 MG 12hr tablet Take 1 tablet by mouth at bedtime as needed for cough.    [provider]  DiphenhydrAMINE HCl, Sleep, (ZZZQUIL PO) Take 25 mg by mouth at bedtime as needed (sleep).     [provider]       VITAL SIGNS:  Blood pressure (!) 87/61, pulse (!) 121, temperature 98.1 F (36.7 C), temperature source Oral, resp. rate (!) 23, height 5\' 10"  (1.778 m), weight 113.4 kg (250 lb), SpO2 100 %.  PHYSICAL EXAMINATION:  Physical Exam  GENERAL:  76 y.o.-year-old patient lying in the bed with no acute distress.  EYES: Pupils equal, round, reactive to light and accommodation. No scleral icterus. Extraocular muscles intact.  HEENT: Head atraumatic, normocephalic. Oropharynx and nasopharynx clear.  NECK:  Supple, no jugular venous distention. No thyroid enlargement, no tenderness.  LUNGS: Normal breath sounds bilaterally, no wheezing, rales,rhonchi or crepitation. No use of accessory muscles of respiration.  CARDIOVASCULAR: S1, S2 normal. No murmurs, rubs, or gallops.  ABDOMEN: Soft, nontender, nondistended. Bowel sounds present. No organomegaly or mass.  EXTREMITIES: No pedal edema, cyanosis, or clubbing.  NEUROLOGIC: Cranial nerves II through XII are intact. Muscle strength 5/5 in all extremities. Sensation intact. Gait not checked.  PSYCHIATRIC: The patient is alert and oriented x 3.  SKIN: petechiae all over body (more on extremities : rt > lt) LABORATORY PANEL:   CBC  Recent Labs Lab 10/19/17 1446  WBC 20.4*  HGB 12.4*  HCT 39.1*  PLT 186   ------------------------------------------------------------------------------------------------------------------  Chemistries   Recent Labs Lab 10/19/17 1446  NA 136  K 4.1  CL 95*  CO2 27  GLUCOSE 108*  BUN 35*  CREATININE 4.09*  CALCIUM 8.0*  AST 16  ALT 24  ALKPHOS 45  BILITOT 1.1   ------------------------------------------------------------------------------------------------------------------  Cardiac Enzymes  Recent Labs Lab 10/19/17 1446  TROPONINI 0.05*   ------------------------------------------------------------------------------------------------------------------  RADIOLOGY:  Ct Angio Chest Pe W  And/or Wo Contrast  Result Date: 10/19/2017 CLINICAL DATA:  Chest pain and shortness of breath.  Hypotension. EXAM: CT ANGIOGRAPHY CHEST WITH CONTRAST TECHNIQUE: Multidetector CT imaging of the chest was performed using the standard protocol during bolus administration of intravenous contrast. Multiplanar CT image reconstructions and MIPs were obtained to evaluate the vascular anatomy. CONTRAST:  75 cc Isovue 370. COMPARISON:  CT abdomen pelvis 05/29/2011. FINDINGS: Cardiovascular: Negative for pulmonary embolus. Atherosclerotic calcification of the arterial vasculature, including severe involvement of coronary arteries. Heart is enlarged. No pericardial effusion. Mediastinum/Nodes: Mediastinal and hilar lymph nodes are not enlarged by CT size criteria. No axillary adenopathy. Esophagus is grossly unremarkable. Lungs/Pleura: Image quality is degraded by expiratory phase imaging. 3 mm subpleural lingular nodule likely a subpleural lymph node. Lungs are otherwise clear. No pleural fluid. Airway is unremarkable. Upper Abdomen: Reflux of contrast into the IVC and hepatic veins. Visualized portions of the liver, gallbladder and adrenal glands are unremarkable. Partially imaged area of presumed fat necrosis along the interpolar right kidney,  likely smaller than on 05/29/2011. Focal area of hyperdensity along the upper pole the right kidney and subcentimeter low-attenuation lesion along the medial left kidney are too small to characterize. Visualized portions of the spleen, pancreas, stomach and bowel are grossly unremarkable. Musculoskeletal: No worrisome lytic or sclerotic lesions. Mild anterior wedging of a lower thoracic vertebral body, likely remote. Review of the MIP images confirms the above findings. IMPRESSION: 1. Negative for pulmonary embolus. 2. Aortic atherosclerosis (ICD10-170.0). Severe coronary artery calcification. Cardiac enlargement. Electronically Signed   By: Lorin Picket M.D.   On: 10/19/2017  16:23   Dg Chest Portable 1 View  Result Date: 10/19/2017 CLINICAL DATA:  Shortness of breath and chest pain after dialysis today. EXAM: PORTABLE CHEST 1 VIEW COMPARISON:  06/20/2017. FINDINGS: Trachea is midline. Heart is enlarged. Lungs are clear. No pleural fluid. IMPRESSION: No acute findings. Electronically Signed   By: Lorin Picket M.D.   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 - consider Rheum c/s if need    All the records are reviewed and case discussed with ED provider. Management plans discussed with the patient, family (Wife and daughter at bedside) and they are in agreement.  CODE STATUS: FULL CODE  TOTAL TIME (critical care) TAKING CARE OF THIS PATIENT: 45 minutes.    Max Sane M.D on 10/19/2017 at 5:39 PM  Between 7am to 6pm - Pager - [phone]  After 6pm go to [email or web address] - Proofreader  [facility]  Office  [phone]  CC: Primary care physician; Madelyn Brunner, MD   Note: This dictation was prepared with Dragon dictation along with smaller phrase technology. Any transcriptional errors that result from this process are unintentional.

## 2017-10-19 NOTE — ED Triage Notes (Signed)
Pt presents to ED via EMS from home c/o chest pain with SOB post dialysis today. EMS states patient was hypotensive systolic 21-62'O.

## 2017-10-19 NOTE — ED Provider Notes (Signed)
[facility] Emergency Department Provider Note  ____________________________________________  Time seen: Approximately 3:04 PM  I have reviewed the triage vital signs and the nursing notes.   HISTORY  Chief Complaint Chest Pain and Shortness of Breath   HPI Maurice Peterson is a 76 y.o. male with a history of ESRD on HD (MWF), atrial flutter on Coumadin, COPD, hypertension and hyperlipidemia who presents for evaluation of chest pain and shortness of breath. Patient reports that he has had a dry cough and intermittent wheezing. Has been using his albuterol since yesterday. Went to dialysis today had a full treatment. When he arrived home he developed chest pain that he describes as sharp, 7 out of 10, located in the center of his chest, pleuritic. He denies ever having similar pain in the past. No fever or chills, no nausea or vomiting. He reports mild shortness of breath associated with this chest pain. Denies any personal or family history of blood clots, recent travel or immobilization, or hemoptysis. He does have chronic bilateral lower extremity swelling. Patient is currently on a month along the prednisone taper for vasculitis.  Past Medical History:  Diagnosis Date  . Arthritis   . Chronic kidney disease    requires dialysis  . COPD (chronic obstructive pulmonary disease) ([place])   . GERD (gastroesophageal reflux disease)   . Hyperlipidemia   . Hypertension   . Prostate cancer ([place])   . Shortness of breath dyspnea     Patient Active Problem List   Diagnosis Date Noted  . Respiratory failure ([place]) 06/20/2017  . COPD exacerbation ([place]) 06/20/2017  . Atrial flutter with rapid ventricular response ([place]) 06/17/2017  . End stage renal disease ([place]) 11/08/2016  . Complication of vascular access for dialysis 11/08/2016  . Essential hypertension, benign 11/08/2016  . Hypercholesterolemia 11/08/2016    Past Surgical History:  Procedure Laterality Date  .  CARDIOVERSION N/A 07/26/2017   Procedure: Cardioversion;  Surgeon: Corey Skains, MD;  Location: [facility] ORS;  Service: Cardiovascular;  Laterality: N/A;  . CARDIOVERSION N/A 08/18/2017   Procedure: CARDIOVERSION;  Surgeon: Corey Skains, MD;  Location: [facility] ORS;  Service: Cardiovascular;  Laterality: N/A;  . CATARACT EXTRACTION W/PHACO Right 08/20/2015   Procedure: CATARACT EXTRACTION PHACO AND INTRAOCULAR LENS PLACEMENT ([place]);  Surgeon: Leandrew Koyanagi, MD;  Location: [place];  Service: Ophthalmology;  Laterality: Right;  . CATARACT EXTRACTION W/PHACO Left 09/10/2015   Procedure: CATARACT EXTRACTION PHACO AND INTRAOCULAR LENS PLACEMENT (IOC);  Surgeon: Leandrew Koyanagi, MD;  Location: [place];  Service: Ophthalmology;  Laterality: Left;  . CORONARY STENT PLACEMENT    . 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 PERIPHERAL VASCULAR CATHETERIZATION Left 05/20/2015   Procedure: A/V Shuntogram/Fistulagram;  Surgeon: Katha Cabal, MD;  Location: [place] [facility];  Service: Cardiovascular;  Laterality: Left;  . PERIPHERAL VASCULAR CATHETERIZATION N/A 05/20/2015   Procedure: A/V Shunt Intervention;  Surgeon: Katha Cabal, MD;  Location: [place] [facility];  Service: Cardiovascular;  Laterality: N/A;  . PERIPHERAL VASCULAR CATHETERIZATION Left 08/05/2015   Procedure: A/V Shuntogram/Fistulagram;  Surgeon: Katha Cabal, MD;  Location: [place] [facility];  Service: Cardiovascular;  Laterality: Left;  . PERIPHERAL VASCULAR CATHETERIZATION N/A 08/05/2015   Procedure: A/V Shunt Intervention;  Surgeon: Katha Cabal, MD;  Location: [place] [facility];  Service: Cardiovascular;  Laterality: N/A;  . PERIPHERAL VASCULAR CATHETERIZATION Left 01/06/2016   Procedure: A/V Shuntogram/Fistulagram;  Surgeon: Katha Cabal,  MD;  Location: [place] [facility];  Service: Cardiovascular;  Laterality: Left;  . PERIPHERAL VASCULAR CATHETERIZATION N/A  01/06/2016   Procedure: A/V Shunt Intervention;  Surgeon: Katha Cabal, MD;  Location: [place] [facility];  Service: Cardiovascular;  Laterality: N/A;  . PERIPHERAL VASCULAR CATHETERIZATION N/A 12/13/2016   Procedure: Dialysis/Perma Catheter Removal;  Surgeon: Algernon Huxley, MD;  Location: [place] [facility];  Service: Cardiovascular;  Laterality: N/A;    Prior to Admission medications   Medication Sig Start Date End Date Taking? Authorizing Provider  amiodarone (PACERONE) 200 MG tablet Take 400 mg (2 tab) by mouth twice a day for 2 weeks, then taken 400 mg (2 tab) by mouth once a day for 1 month. 10/18/17  Yes Deboraha Sprang, MD  atorvastatin (LIPITOR) 10 MG tablet Take 10 mg by mouth every evening.    Yes [provider]  calcium acetate (PHOSLO) 667 MG capsule Take 1,334 mg by mouth 3 (three) times daily with meals.   Yes [provider]  carvedilol (COREG) 6.25 MG tablet Take 1 tablet (6.25 mg total) by mouth 2 (two) times daily with a meal. 06/19/17  Yes Sainani, Belia Heman, MD  Cholecalciferol (VITAMIN D3) 2000 units TABS Take 2,000 Units by mouth daily.    Yes [provider]  febuxostat (ULORIC) 40 MG tablet Take 40 mg by mouth every evening.    Yes [provider]  furosemide (LASIX) 80 MG tablet Take 80 mg by mouth daily.   Yes [provider]  montelukast (SINGULAIR) 10 MG tablet Take 10 mg by mouth every evening.    Yes [provider]  multivitamin (RENA-VIT) TABS tablet Take 1 tablet by mouth daily.   Yes [provider]  pantoprazole (PROTONIX) 40 MG tablet Take 40 mg by mouth daily.   Yes [provider]  predniSONE (DELTASONE) 10 MG tablet Take 30 mg x 1 week, take 20 mg x 1 week, take 10 mg x 1 week, then off 10/11/17  Yes [provider]  umeclidinium-vilanterol (ANORO ELLIPTA) 62.5-25 MCG/INH AEPB Inhale 1 puff into the lungs daily.   Yes [provider]  warfarin (COUMADIN) 3 MG  tablet Take 3 mg by mouth every evening.    Yes [provider]  acetaminophen (TYLENOL) 500 MG tablet Take 1,000 mg by mouth daily as needed for moderate pain or headache.    [provider]  albuterol (PROVENTIL HFA;VENTOLIN HFA) 108 (90 BASE) MCG/ACT inhaler Inhale 2 puffs into the lungs every 4 (four) hours as needed for wheezing or shortness of breath.    [provider]  azelastine (OPTIVAR) 0.05 % ophthalmic solution Place 1 drop into both eyes 2 (two) times daily as needed (allergies).    [provider]  dextromethorphan-guaiFENesin (MUCINEX DM) 30-600 MG 12hr tablet Take 1 tablet by mouth at bedtime as needed for cough.    [provider]  DiphenhydrAMINE HCl, Sleep, (ZZZQUIL PO) Take 25 mg by mouth at bedtime as needed (sleep).     [provider]    Allergies Ace inhibitors; Amoxicillin-pot clavulanate; and Other  Family History  Problem Relation Age of Onset  . Liver disease Mother   . Heart disease Mother   . Hypertension Mother   . Pancreatic cancer Father     Social History Social History  Substance Use Topics  . Smoking status: Former Smoker    Packs/day: 1.00    Years: 30.00    Types: Cigarettes  Quit date: 05/19/2009  . Smokeless tobacco: Never Used  . Alcohol use No    Review of Systems  Constitutional: Negative for fever. Eyes: Negative for visual changes. ENT: Negative for sore throat. Neck: No neck pain  Cardiovascular: + chest pain. Respiratory:+ shortness of breath, wheezing, cough Gastrointestinal: Negative for abdominal pain, vomiting or diarrhea. Genitourinary: Negative for dysuria. Musculoskeletal: Negative for back pain. Skin: Negative for rash. Neurological: Negative for headaches, weakness or numbness. Psych: No SI or HI  ____________________________________________   PHYSICAL EXAM:  VITAL SIGNS: ED Triage Vitals  Enc Vitals Group     BP 10/19/17 1413 101/82     Pulse Rate  10/19/17 1413 (!) 121     Resp 10/19/17 1413 (!) 26     Temp 10/19/17 1413 98.1 F (36.7 C)     Temp Source 10/19/17 1413 Oral     SpO2 10/19/17 1413 100 %     Weight 10/19/17 1414 250 lb (113.4 kg)     Height 10/19/17 1414 5\' 10"  (1.778 m)     Head Circumference --      Peak Flow --      Pain Score --      Pain Loc --      Pain Edu? --      Excl. in [place]? --     Constitutional: Alert and oriented. Well appearing and in no apparent distress. HEENT:      Head: Normocephalic and atraumatic.         Eyes: Conjunctivae are normal. Sclera is non-icteric.       Mouth/Throat: Mucous membranes are moist.       Neck: Supple with no signs of meningismus. Cardiovascular: tachycardic with regular rhythm. No murmurs, gallops, or rubs. 2+ symmetrical distal pulses are present in all extremities. No JVD. Respiratory: Normal respiratory effort. Slightly decreased air movement with expiratory wheezes bilaterally Gastrointestinal: Soft, non tender, and non distended with positive bowel sounds. No rebound or guarding. Musculoskeletal: 1+ pitting edema bilaterally Neurologic: Normal speech and language. Face is symmetric. Moving all extremities. No gross focal neurologic deficits are appreciated. Skin: Skin is warm, dry and intact. No rash noted. Psychiatric: Mood and affect are normal. Speech and behavior are normal.  ____________________________________________   LABS (all labs ordered are listed, but only abnormal results are displayed)  Labs Reviewed  TROPONIN I - Abnormal; Notable for the following:       Result Value   Troponin I 0.05 (*)    All other components within normal limits  COMPREHENSIVE METABOLIC PANEL - Abnormal; Notable for the following:    Chloride 95 (*)    Glucose, Bld 108 (*)    BUN 35 (*)    Creatinine, Ser 4.09 (*)    Calcium 8.0 (*)    GFR calc non Af Amer 13 (*)    GFR calc Af Amer 15 (*)    All other components within normal limits  CBC WITH  DIFFERENTIAL/PLATELET - Abnormal; Notable for the following:    WBC 20.4 (*)    RBC 4.17 (*)    Hemoglobin 12.4 (*)    HCT 39.1 (*)    MCHC 31.8 (*)    RDW 17.7 (*)    Neutro Abs 18.2 (*)    Lymphs Abs 0.7 (*)    Monocytes Absolute 1.3 (*)    Basophils Absolute 0.2 (*)    All other components within normal limits  CULTURE, BLOOD (ROUTINE X 2)  CULTURE, BLOOD (ROUTINE X 2)  PROTIME-INR  APTT  LACTIC ACID, PLASMA  LACTIC ACID, PLASMA  INFLUENZA PANEL BY PCR (TYPE A & B)  LIPASE, BLOOD   ____________________________________________  EKG  ED ECG REPORT I, Rudene Re, the attending physician, personally viewed and interpreted this ECG.  Sinus tachycardia, rate of 120, right bundle branch block, prolonged QTC, normal axis, no ST elevations or depressions.unchanged from prior from June 2018 ____________________________________________  RADIOLOGY  CXR:  negative ____________________________________________   PROCEDURES  Procedure(s) performed: None Procedures Critical Care performed: yes  CRITICAL CARE Performed by: Rudene Re  ?  Total critical care time: 35 min  Critical care time was exclusive of separately billable procedures and treating other patients.  Critical care was necessary to treat or prevent imminent or life-threatening deterioration.  Critical care was time spent personally by me on the following activities: development of treatment plan with patient and/or surrogate as well as nursing, discussions with consultants, evaluation of patient's response to treatment, examination of patient, obtaining history from patient or surrogate, ordering and performing treatments and interventions, ordering and review of laboratory studies, ordering and review of radiographic studies, pulse oximetry and re-evaluation of patient's condition.  ____________________________________________   INITIAL IMPRESSION / ASSESSMENT AND PLAN / ED COURSE  76 y.o.  male with a history of ESRD on HD (MWF), atrial flutter on Coumadin, COPD, hypertension and hyperlipidemia who presents for evaluation of sharp sudden onset pleuritic chest pain and shortness of breath after HD today. Patient with URI symptoms for a few days using his inhaler at home. patient is tachycardic and hypotensive slightly tachypneic with wheezing bilaterally. since patient has received a full dialysis treatment today will give him gentle hydration. Chest x-ray with no evidence of pleural effusion, pneumothorax, pneumonia, or pulmonary edema. EKG showing sinus tachycardia with no ischemic changes. Differential diagnoses including PE versus ACS versus GI bleed on Coumadin versus COPD exacerbation. We'll give is open and intact to avoid worsening tachycardia, we'll give 250 cc of IV fluids. We'll check basic blood work, we'll give him steroids.     _________________________ 4:49 PM on 10/19/2017 -----------------------------------------  Labs showing leukocytosis with WBC 20K which some of it probably due to steroids however in the setting of tachypnea, tachycardia, and hypotension patient was made a code sepsis. Given cefepime and vancomycin. After 250cc x 2 bolus BP and HR are improved (HR 122 -110 and sBP 80s-90s). Blood cultures pending. CTA with no evidence of PE or PNA. Flu pending. Patient does not make urine. Will admit to [facility] at this time.   As part of my medical decision making, I reviewed the following data within the [place] History obtained from family, Nursing notes reviewed and incorporated, Labs reviewed , EKG interpreted , Old EKG reviewed, Old chart reviewed, Radiograph reviewed , Discussed with admitting physician , Notes from prior ED visits and [place] Controlled Substance Database    Pertinent labs & imaging results that were available during my care of the patient were reviewed by me and considered in my medical decision making (see chart for  details).    ____________________________________________   FINAL CLINICAL IMPRESSION(S) / ED DIAGNOSES  Final diagnoses:  Sepsis, due to unspecified organism [facility])  Pleuritic chest pain      NEW MEDICATIONS STARTED DURING THIS VISIT:  New Prescriptions   No medications on file     Note:  This document was prepared using Dragon voice recognition software and may include unintentional dictation errors.    Rudene Re, MD 10/19/17  1652  

## 2017-10-19 NOTE — Consult Note (Signed)
Name: Maurice Peterson MRN: [id number] DOB: 11-Jan-1941    ADMISSION DATE:  10/19/2017  CONSULTATION DATE: 10/19/17  REFERRING MD : Dr.Shah  CHIEF COMPLAINT:  Chest pain  BRIEF PATIENT DESCRIPTION: 76 year old male with known history of ESRD,On HD,atrial flutter on coumadin,COPD,Hypertension and hyperlipidemia being admitted for  Possible sepsis.  SIGNIFICANT EVENTS  10/24 Patient admitted to the SDU for suspected sepsis and chest pain for observation  STUDIES:  06/18/17 ECHO>>Left ventricle: The cavity size was normal. Wall thickness was  normal. Systolic function was moderately reduced. The estimated  ejection fraction was in the range of 35% to 40%. 10/19/17 CTPE>>Negative for pulmonary embolus.  HISTORY OF PRESENT ILLNESS:  Maurice Peterson is a 76 year old male with known history of COPD,GERD,Hyperlipidemia ,Hypertension , ESRD on HD, Atrial flutter on coumadin.  Patient presents to ED on 10/24 to [facility] with complaints of shortness of breath and chest pain.  Patient was having dry cough and intermittent wheezing since yesterday and he has been taking albuterol.  He was scheduled for dialysis and had dialysis treatment.  When he reached home,he started to have chest pain.  Patient is on prednisone for vasculitis and followed at [facility].  Patient was admitted by the hospitalist and transferred to the SDU with concerns of sepsis and underlying heart disease.  PAST MEDICAL HISTORY :   has a past medical history of Arthritis; Chronic kidney disease; COPD (chronic obstructive pulmonary disease) ([place]); GERD (gastroesophageal reflux disease); Hyperlipidemia; Hypertension; Prostate cancer ([place]); and Shortness of breath dyspnea.  has a past surgical history that includes Cardiac catheterization (Left, 05/20/2015); Cardiac catheterization (N/A, 05/20/2015); Cardiac catheterization (Left, 08/05/2015); Cardiac catheterization (N/A, 08/05/2015); Kidney surgery (Right); Coronary stent placement; Cataract extraction w/PHACO  (Right, 08/20/2015); Cataract extraction w/PHACO (Left, 09/10/2015); Cardiac catheterization (Left, 01/06/2016); Cardiac catheterization (N/A, 01/06/2016); Cardiac catheterization (N/A, 12/13/2016); CARDIOVERSION (N/A, 07/26/2017); and CARDIOVERSION (N/A, 08/18/2017). Prior to Admission medications   Medication Sig Start Date End Date Taking? Authorizing Provider  amiodarone (PACERONE) 200 MG tablet Take 400 mg (2 tab) by mouth twice a day for 2 weeks, then taken 400 mg (2 tab) by mouth once a day for 1 month. 10/18/17  Yes Deboraha Sprang, MD  atorvastatin (LIPITOR) 10 MG tablet Take 10 mg by mouth every evening.    Yes [provider]  calcium acetate (PHOSLO) 667 MG capsule Take 1,334 mg by mouth 3 (three) times daily with meals.   Yes [provider]  carvedilol (COREG) 6.25 MG tablet Take 1 tablet (6.25 mg total) by mouth 2 (two) times daily with a meal. 06/19/17  Yes Sainani, Belia Heman, MD  Cholecalciferol (VITAMIN D3) 2000 units TABS Take 2,000 Units by mouth daily.    Yes [provider]  febuxostat (ULORIC) 40 MG tablet Take 40 mg by mouth every evening.    Yes [provider]  furosemide (LASIX) 80 MG tablet Take 80 mg by mouth daily.   Yes [provider]  montelukast (SINGULAIR) 10 MG tablet Take 10 mg by mouth every evening.    Yes [provider]  multivitamin (RENA-VIT) TABS tablet Take 1 tablet by mouth daily.   Yes [provider]  pantoprazole (PROTONIX) 40 MG tablet Take 40 mg by mouth daily.   Yes [provider]  predniSONE (DELTASONE) 10 MG tablet Take 30 mg x 1 week, take 20 mg x 1 week, take 10 mg x 1 week, then off 10/11/17  Yes [provider]  umeclidinium-vilanterol (ANORO ELLIPTA)  62.5-25 MCG/INH AEPB Inhale 1 puff into the lungs daily.   Yes [provider]  warfarin (COUMADIN) 3 MG tablet Take 3 mg by mouth every evening.    Yes [provider]  acetaminophen (TYLENOL) 500 MG  tablet Take 1,000 mg by mouth daily as needed for moderate pain or headache.    [provider]  albuterol (PROVENTIL HFA;VENTOLIN HFA) 108 (90 BASE) MCG/ACT inhaler Inhale 2 puffs into the lungs every 4 (four) hours as needed for wheezing or shortness of breath.    [provider]  azelastine (OPTIVAR) 0.05 % ophthalmic solution Place 1 drop into both eyes 2 (two) times daily as needed (allergies).    [provider]  dextromethorphan-guaiFENesin (MUCINEX DM) 30-600 MG 12hr tablet Take 1 tablet by mouth at bedtime as needed for cough.    [provider]  DiphenhydrAMINE HCl, Sleep, (ZZZQUIL PO) Take 25 mg by mouth at bedtime as needed (sleep).     [provider]   Allergies  Allergen Reactions  . Ace Inhibitors Nausea And Vomiting  . Amoxicillin-Pot Clavulanate Nausea And Vomiting       . Other Nausea And Vomiting    Anti-inflammatories    FAMILY HISTORY:  family history includes Heart disease in his mother; Hypertension in his mother; Liver disease in his mother; Pancreatic cancer in his father. SOCIAL HISTORY:  reports that he quit smoking about 8 years ago. His smoking use included Cigarettes. He has a 30.00 pack-year smoking history. He has never used smokeless tobacco. He reports that he does not drink alcohol or use drugs.  REVIEW OF SYSTEMS:   Constitutional: Negative for fever, chills, weight loss, malaise/fatigue and diaphoresis.  HENT: Negative for hearing loss, ear pain, nosebleeds, congestion, sore throat, neck pain, tinnitus and ear discharge.   Eyes: Negative for blurred vision, double vision, photophobia, pain, discharge and redness.  Respiratory: Negative for cough, hemoptysis, sputum production, shortness of breath, wheezing and stridor.   Cardiovascular: Negative for chest pain, palpitations, orthopnea, claudication, leg swelling and PND.  Gastrointestinal: Negative for heartburn, nausea, vomiting, abdominal pain, diarrhea,  constipation, blood in stool and melena.  Genitourinary: Negative for dysuria, urgency, frequency, hematuria and flank pain.  Musculoskeletal: Negative for myalgias, back pain, joint pain and falls.  Skin: Negative for itching and rash.  Neurological: Negative for dizziness, tingling, tremors, sensory change, speech change, focal weakness, seizures, loss of consciousness, weakness and headaches.  Endo/Heme/Allergies: Negative for environmental allergies and polydipsia. Does not bruise/bleed easily.  SUBJECTIVE: Patient denies any pain or discomfort at this time  VITAL SIGNS: Temp:  [98.1 F (36.7 C)] 98.1 F (36.7 C) (10/24 1413) Pulse Rate:  [115-121] 117 (10/24 1830) Resp:  [18-30] 18 (10/24 1830) BP: (75-101)/(61-82) 96/74 (10/24 1830) SpO2:  [85 %-100 %] 100 % (10/24 1830) Weight:  [250 lb (113.4 kg)] 250 lb (113.4 kg) (10/24 1414)  PHYSICAL EXAMINATION: General: 76 year old male,in no acute distress Neuro:  Awake,Alert and oriented HEENT:  AT,[place],No Jvd, Cardiovascular:  ST,S1S2,no m/r/g Lungs: clear bilaterally, no wheezes,crackles Abdomen: soft,NT,ND,Positive bowel sounds Musculoskeletal:no edema,cyanosis Skin: warm,dry and intact   Recent Labs Lab 10/18/17 1030 10/19/17 1446  NA 141 136  K 5.2 4.1  CL 94* 95*  CO2 25 27  BUN 57* 35*  CREATININE 5.80* 4.09*  GLUCOSE 97 108*    Recent Labs Lab 10/18/17 1030 10/19/17 1446  HGB 12.0* 12.4*  HCT 37.2* 39.1*  WBC 22.3* 20.4*  PLT 159 186   Ct Angio Chest  Pe W And/or Wo Contrast  Result Date: 10/19/2017 CLINICAL DATA:  Chest pain and shortness of breath.  Hypotension. EXAM: CT ANGIOGRAPHY CHEST WITH CONTRAST TECHNIQUE: Multidetector CT imaging of the chest was performed using the standard protocol during bolus administration of intravenous contrast. Multiplanar CT image reconstructions and MIPs were obtained to evaluate the vascular anatomy. CONTRAST:  75 cc Isovue 370. COMPARISON:  CT abdomen pelvis 05/29/2011.  FINDINGS: Cardiovascular: Negative for pulmonary embolus. Atherosclerotic calcification of the arterial vasculature, including severe involvement of coronary arteries. Heart is enlarged. No pericardial effusion. Mediastinum/Nodes: Mediastinal and hilar lymph nodes are not enlarged by CT size criteria. No axillary adenopathy. Esophagus is grossly unremarkable. Lungs/Pleura: Image quality is degraded by expiratory phase imaging. 3 mm subpleural lingular nodule likely a subpleural lymph node. Lungs are otherwise clear. No pleural fluid. Airway is unremarkable. Upper Abdomen: Reflux of contrast into the IVC and hepatic veins. Visualized portions of the liver, gallbladder and adrenal glands are unremarkable. Partially imaged area of presumed fat necrosis along the interpolar right kidney, likely smaller than on 05/29/2011. Focal area of hyperdensity along the upper pole the right kidney and subcentimeter low-attenuation lesion along the medial left kidney are too small to characterize. Visualized portions of the spleen, pancreas, stomach and bowel are grossly unremarkable. Musculoskeletal: No worrisome lytic or sclerotic lesions. Mild anterior wedging of a lower thoracic vertebral body, likely remote. Review of the MIP images confirms the above findings. IMPRESSION: 1. Negative for pulmonary embolus. 2. Aortic atherosclerosis (ICD10-170.0). Severe coronary artery calcification. Cardiac enlargement. Electronically Signed   By: Lorin Picket M.D.   On: 10/19/2017 16:23   Dg Chest Portable 1 View  Result Date: 10/19/2017 CLINICAL DATA:  Shortness of breath and chest pain after dialysis today. EXAM: PORTABLE CHEST 1 VIEW COMPARISON:  06/20/2017. FINDINGS: Trachea is midline. Heart is enlarged. Lungs are clear. No pleural fluid. IMPRESSION: No acute findings. Electronically Signed   By: Lorin Picket M.D.   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 Elevated troponin Leukocytosis possibly related to prednisone  Plan -Catheter  Ablation planned for 10/30 by Dr.Klein -Continue  amiodarone at 400 twice a day 2 weeks and then 400 daily for a month per Dr Caryl Comes -Continue Coumadin -Monitor fever,cbc - Trend troponin - Continue Atorvastatin - nephrology and cardiology consulted - No clear etiology of sepsis,CXR clear therefore will d/c antibiotics - Follow cultures -Patient [facility] for [facility]    Bincy Varughese,AG-ACNP Pulmonary and [place]   10/19/2017, 7:25 PM   Merton Border, MD PCCM service Mobile [phone] Pager [phone] 10/20/2017 1:17 PM

## 2017-10-19 NOTE — ED Notes (Signed)
Due to hard stick "ok" to get 1 blood culture then start IV antibiotics per Dr. Alfred Levins; also spoke with e-link nurse

## 2017-10-19 NOTE — ED Notes (Signed)
CODE SEPSIS CALLED TO DOUG AT [facility] 

## 2017-10-20 ENCOUNTER — Encounter: Payer: Self-pay | Admitting: Physician Assistant

## 2017-10-20 DIAGNOSIS — I483 Typical atrial flutter: Secondary | ICD-10-CM

## 2017-10-20 DIAGNOSIS — I255 Ischemic cardiomyopathy: Secondary | ICD-10-CM

## 2017-10-20 DIAGNOSIS — Z992 Dependence on renal dialysis: Secondary | ICD-10-CM

## 2017-10-20 DIAGNOSIS — N186 End stage renal disease: Secondary | ICD-10-CM

## 2017-10-20 DIAGNOSIS — I959 Hypotension, unspecified: Secondary | ICD-10-CM

## 2017-10-20 DIAGNOSIS — I484 Atypical atrial flutter: Secondary | ICD-10-CM

## 2017-10-20 DIAGNOSIS — R0781 Pleurodynia: Secondary | ICD-10-CM

## 2017-10-20 LAB — GLUCOSE, CAPILLARY: Glucose-Capillary: 119 mg/dL — ABNORMAL HIGH (ref 65–99)

## 2017-10-20 LAB — BASIC METABOLIC PANEL
Anion gap: 16 — ABNORMAL HIGH (ref 5–15)
BUN: 49 mg/dL — ABNORMAL HIGH (ref 6–20)
CO2: 27 mmol/L (ref 22–32)
Calcium: 7.9 mg/dL — ABNORMAL LOW (ref 8.9–10.3)
Chloride: 93 mmol/L — ABNORMAL LOW (ref 101–111)
Creatinine, Ser: 5.59 mg/dL — ABNORMAL HIGH (ref 0.61–1.24)
GFR calc Af Amer: 10 mL/min — ABNORMAL LOW (ref >60)
GFR, EST NON AFRICAN AMERICAN: 9 mL/min — AB (ref >60)
GLUCOSE: 151 mg/dL — AB (ref 65–99)
POTASSIUM: 5.2 mmol/L — AB (ref 3.5–5.1)
Sodium: 136 mmol/L (ref 135–145)

## 2017-10-20 LAB — CBC
HEMATOCRIT: 37.7 % — AB (ref 40.0–52.0)
Hemoglobin: 12.1 g/dL — ABNORMAL LOW (ref 13.0–18.0)
MCH: 30.2 pg (ref 26.0–34.0)
MCHC: 32 g/dL (ref 32.0–36.0)
MCV: 94.5 fL (ref 80.0–100.0)
Platelets: 113 10*3/uL — ABNORMAL LOW (ref 150–440)
RBC: 3.99 MIL/uL — ABNORMAL LOW (ref 4.40–5.90)
RDW: 17.3 % — AB (ref 11.5–14.5)
WBC: 19.7 10*3/uL — ABNORMAL HIGH (ref 3.8–10.6)

## 2017-10-20 LAB — LACTIC ACID, PLASMA: Lactic Acid, Venous: 1.8 mmol/L (ref 0.5–1.9)

## 2017-10-20 LAB — CORTISOL: Cortisol, Plasma: 11.6 ug/dL

## 2017-10-20 LAB — PROTIME-INR
INR: 2.24
Prothrombin Time: 24.6 seconds — ABNORMAL HIGH (ref 11.4–15.2)

## 2017-10-20 LAB — TROPONIN I
Troponin I: 0.05 ng/mL (ref <0.03)
Troponin I: 0.05 ng/mL (ref <0.03)
Troponin I: 0.07 ng/mL (ref <0.03)

## 2017-10-20 LAB — PROCALCITONIN: Procalcitonin: 0.18 ng/mL

## 2017-10-20 MED ORDER — ACETAMINOPHEN 500 MG PO TABS
1000.0000 mg | ORAL_TABLET | Freq: Every day | ORAL | Status: DC | PRN
  Administered 2017-10-20 – 2017-10-25 (×3): 1000 mg via ORAL
  Filled 2017-10-20 (×3): qty 2

## 2017-10-20 MED ORDER — AMIODARONE HCL 200 MG PO TABS
400.0000 mg | ORAL_TABLET | Freq: Two times a day (BID) | ORAL | Status: DC
  Administered 2017-10-20 – 2017-10-26 (×12): 400 mg via ORAL
  Filled 2017-10-20 (×12): qty 2

## 2017-10-20 MED ORDER — SODIUM CHLORIDE 0.9% FLUSH
3.0000 mL | Freq: Two times a day (BID) | INTRAVENOUS | Status: DC
  Administered 2017-10-20 – 2017-10-24 (×9): 3 mL via INTRAVENOUS

## 2017-10-20 MED ORDER — SODIUM CHLORIDE 0.9% FLUSH: 3.0000 mL | INTRAVENOUS | Status: DC | PRN

## 2017-10-20 MED ORDER — SODIUM CHLORIDE 0.9 % IV SOLN: 250.0000 mL | INTRAVENOUS | Status: DC

## 2017-10-20 NOTE — Progress Notes (Signed)
[facility] NAME: Maurice Peterson    MR#:  [id number]  DATE OF BIRTH:  1941/03/16  SUBJECTIVE:    REVIEW OF SYSTEMS:   ROS Tolerating Diet: Tolerating PT:   DRUG ALLERGIES:   Allergies  Allergen Reactions  . Ace Inhibitors Nausea And Vomiting  . Amoxicillin-Pot Clavulanate Nausea And Vomiting       . Other Nausea And Vomiting    Anti-inflammatories    VITALS:  Blood pressure 90/76, pulse (!) 115, temperature 97.7 F (36.5 C), temperature source Oral, resp. rate (!) 23, height 5\' 9"  (1.753 m), weight 113.6 kg (250 lb 7.1 oz), SpO2 98 %.  PHYSICAL EXAMINATION:   Physical Exam  GENERAL:  76 y.o.-year-old patient lying in the bed with no acute distress.  EYES: Pupils equal, round, reactive to light and accommodation. No scleral icterus. Extraocular muscles intact.  HEENT: Head atraumatic, normocephalic. Oropharynx and nasopharynx clear.  NECK:  Supple, no jugular venous distention. No thyroid enlargement, no tenderness.  LUNGS: Normal breath sounds bilaterally, no wheezing, rales, rhonchi. No use of accessory muscles of respiration.  CARDIOVASCULAR: S1, S2 normal. No murmurs, rubs, or gallops.  ABDOMEN: Soft, nontender, nondistended. Bowel sounds present. No organomegaly or mass.  EXTREMITIES: No cyanosis, clubbing or edema b/l.    NEUROLOGIC: Cranial nerves II through XII are intact. No focal Motor or sensory deficits b/l.   PSYCHIATRIC:  patient is alert and oriented x 3.  SKIN: No obvious rash, lesion, or ulcer.   LABORATORY PANEL:  CBC  Recent Labs Lab 10/20/17 0206  WBC 19.7*  HGB 12.1*  HCT 37.7*  PLT 113*    Chemistries   Recent Labs Lab 10/19/17 1446 10/20/17 0206  NA 136 136  K 4.1 5.2*  CL 95* 93*  CO2 27 27  GLUCOSE 108* 151*  BUN 35* 49*  CREATININE 4.09* 5.59*  CALCIUM 8.0* 7.9*  AST 16  --   ALT 24  --   ALKPHOS 45  --   BILITOT 1.1  --    Cardiac Enzymes  Recent Labs Lab  10/20/17 0758  TROPONINI 0.05*   RADIOLOGY:  Ct Angio Chest Pe W And/or Wo Contrast  Result Date: 10/19/2017 CLINICAL DATA:  Chest pain and shortness of breath.  Hypotension. EXAM: CT ANGIOGRAPHY CHEST WITH CONTRAST TECHNIQUE: Multidetector CT imaging of the chest was performed using the standard protocol during bolus administration of intravenous contrast. Multiplanar CT image reconstructions and MIPs were obtained to evaluate the vascular anatomy. CONTRAST:  75 cc Isovue 370. COMPARISON:  CT abdomen pelvis 05/29/2011. FINDINGS: Cardiovascular: Negative for pulmonary embolus. Atherosclerotic calcification of the arterial vasculature, including severe involvement of coronary arteries. Heart is enlarged. No pericardial effusion. Mediastinum/Nodes: Mediastinal and hilar lymph nodes are not enlarged by CT size criteria. No axillary adenopathy. Esophagus is grossly unremarkable. Lungs/Pleura: Image quality is degraded by expiratory phase imaging. 3 mm subpleural lingular nodule likely a subpleural lymph node. Lungs are otherwise clear. No pleural fluid. Airway is unremarkable. Upper Abdomen: Reflux of contrast into the IVC and hepatic veins. Visualized portions of the liver, gallbladder and adrenal glands are unremarkable. Partially imaged area of presumed fat necrosis along the interpolar right kidney, likely smaller than on 05/29/2011. Focal area of hyperdensity along the upper pole the right kidney and subcentimeter low-attenuation lesion along the medial left kidney are too small to characterize. Visualized portions of the spleen, pancreas, stomach and bowel are grossly unremarkable. Musculoskeletal: No worrisome lytic or  sclerotic lesions. Mild anterior wedging of a lower thoracic vertebral body, likely remote. Review of the MIP images confirms the above findings. IMPRESSION: 1. Negative for pulmonary embolus. 2. Aortic atherosclerosis (ICD10-170.0). Severe coronary artery calcification. Cardiac  enlargement. Electronically Signed   By: Lorin Picket M.D.   On: 10/19/2017 16:23   Dg Chest Portable 1 View  Result Date: 10/19/2017 CLINICAL DATA:  Shortness of breath and chest pain after dialysis today. EXAM: PORTABLE CHEST 1 VIEW COMPARISON:  06/20/2017. FINDINGS: Trachea is midline. Heart is enlarged. Lungs are clear. No pleural fluid. IMPRESSION: No acute findings. Electronically Signed   By: Lorin Picket M.D.   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 - consider Rheum c/s if need  Case discussed with Care Management/Social Worker. Management plans discussed with the patient, family and they are in agreement.  CODE STATUS: Full code  DVT Prophylaxis: coumadin  TOTAL TIME TAKING CARE OF THIS PATIENT: *30* minutes.  >50% time spent on counselling and coordination of care  POSSIBLE D/C IN 2-3 DAYS, DEPENDING ON CLINICAL CONDITION.  Note: This dictation was prepared with Dragon dictation along with smaller phrase technology. Any transcriptional errors that result from this process are unintentional.  Xzander Gilham M.D on 10/20/2017 at 2:09 PM  Between 7am to 6pm - Pager - [phone]  After  6pm go to [email or web address] - password [email or web address] [facility]  Office  [phone]  CC: Primary care physician; Madelyn Brunner, MD

## 2017-10-20 NOTE — Consult Note (Signed)
Cardiology Consultation:   Patient ID: Maurice Peterson; [id number]; 03-17-1941   Admit date: 10/19/2017 Date of Consult: 10/20/2017  Primary Care Provider: Madelyn Brunner, MD Primary Cardiologist: Maurice Peterson Primary Electrophysiologist:  Maurice Peterson   Patient Profile:   Maurice Peterson is a 76 y.o. 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 Manuella Ghazi, MD.  History of Present Illness:   Maurice Peterson primary cardiologist is Dr. Nehemiah Massed, MD with [facility]. He was recently seen by Dr. Caryl Peterson with [facility] for evaluation of atrial flutter with RVR on 10/23. He was initially diagnosed with atrial flutter/fibrillation in 05/2017 at which time he was discharged on warfarin and amiodarone. He underwent briefly successful DCCV in 07/2017 with improvement in overall symptoms. When he was seen in 08/2017 he was maintaining sinus rhythm. On follow up visit in early 09/2017 he was noted to be in Afib/flutter and his AAT was stopped. He was continued on warfarin and Coreg. With being in Afib/flutter with RVR he noted worsening exercise tolerance.   Prior echo from 10/2016 showed EF 30-35% with a LA dimension of 28 mm. Most recent ischemic evaluation via Myoview in 10/2016 with normal perfusion with an EF of 46%. Most recent echo from 05/2017 showed EF 35-40%, LA 50 mm.   He was seen by Dr. Caryl Peterson on 10/23 for EP evaluation with recommended reloading of amiodarone followed by DCCV in ~ 1 week. He had been therapeutic with his Coumadin dating back to July, 2018.   Patient has been noting significant increases in his weight between HD sessions. He reports a dry weight of 108 kg and between his last two HD  sessions his weight has increased to 112 and 117 kg respectively. He was able to be dialyzed down to 108 kg each time. Following HD on 10/24, he developed chest tightness with increased SOB. Chest tightness was sharp and rated 7/10, did not radiate, and was worse with deep inspiration. He was able to undergo his full HD on 10/24 without issues. Upon the patient's arrival to [facility] there was concern for possible sepsis of unknown etiology. EKG showed atrial flutter with RVR, 120 bpm, 2:1 conduction, poor R wave progression, nonspecific st/t changes. CTA chest was negative for PE, though did show aortic atherosclerosis and severe coronary artery calcifications. CXR was not acute. Troponin minimally elevated with a peak of 0.05 x 5, INR therapeutic, WBC elevated, HGB stable, flu negative. He was admitted to ICU wit sepsis protocol per IM. He was continued on amiodarone and warfarin. Cardiology was asked to evaluate.   Past Medical History:  Diagnosis Date  . Arthritis   . Chronic kidney disease    requires dialysis  . COPD (chronic obstructive pulmonary disease) ([place])   . GERD (gastroesophageal reflux disease)   . Hyperlipidemia   . Hypertension   . Prostate cancer ([place])   . Shortness of breath dyspnea     Past Surgical History:  Procedure Laterality Date  . CARDIOVERSION N/A 07/26/2017   Procedure: Cardioversion;  Surgeon: Corey Skains, MD;  Location: [facility];  Service: Cardiovascular;  Laterality: N/A;  . CARDIOVERSION N/A 08/18/2017   Procedure: CARDIOVERSION;  Surgeon: Corey Skains, MD;  Location: [facility];  Service: Cardiovascular;  Laterality: N/A;  . CATARACT EXTRACTION W/PHACO Right 08/20/2015   Procedure: CATARACT EXTRACTION PHACO AND INTRAOCULAR LENS PLACEMENT (IOC);  Surgeon: Leandrew Koyanagi, MD;  Location: [facility];  Service: Ophthalmology;  Laterality: Right;  . CATARACT EXTRACTION W/PHACO Left 09/10/2015   Procedure: CATARACT EXTRACTION PHACO AND INTRAOCULAR  LENS PLACEMENT (IOC);  Surgeon: Leandrew Koyanagi, MD;  Location: [facility];  Service: Ophthalmology;  Laterality: Left;  . CORONARY STENT PLACEMENT    . 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 PERIPHERAL VASCULAR CATHETERIZATION Left 05/20/2015   Procedure: A/V Shuntogram/Fistulagram;  Surgeon: Katha Cabal, MD;  Location: [place] [facility];  Service: Cardiovascular;  Laterality: Left;  . PERIPHERAL VASCULAR CATHETERIZATION N/A 05/20/2015   Procedure: A/V Shunt Intervention;  Surgeon: Katha Cabal, MD;  Location: [place] [facility];  Service: Cardiovascular;  Laterality: N/A;  . PERIPHERAL VASCULAR CATHETERIZATION Left 08/05/2015   Procedure: A/V Shuntogram/Fistulagram;  Surgeon: Katha Cabal, MD;  Location: [place] [facility];  Service: Cardiovascular;  Laterality: Left;  . PERIPHERAL VASCULAR CATHETERIZATION N/A 08/05/2015   Procedure: A/V Shunt Intervention;  Surgeon: Katha Cabal, MD;  Location: [place] [facility];  Service: Cardiovascular;  Laterality: N/A;  . PERIPHERAL VASCULAR CATHETERIZATION Left 01/06/2016   Procedure: A/V Shuntogram/Fistulagram;  Surgeon: Katha Cabal, MD;  Location: [place] [facility];  Service: Cardiovascular;  Laterality: Left;  . PERIPHERAL VASCULAR CATHETERIZATION N/A 01/06/2016   Procedure: A/V Shunt Intervention;  Surgeon: Katha Cabal, MD;  Location: [place] [facility];  Service: Cardiovascular;  Laterality: N/A;  . PERIPHERAL VASCULAR CATHETERIZATION N/A 12/13/2016   Procedure: Dialysis/Perma Catheter Removal;  Surgeon: Algernon Huxley, MD;  Location: [place] [facility];  Service: Cardiovascular;  Laterality: N/A;     Home Meds: Prior to Admission medications   Medication Sig Start Date End Date Taking? Authorizing Provider  amiodarone (PACERONE) 200 MG tablet Take 400 mg (2 tab) by mouth twice a day for 2 weeks, then taken 400 mg (2 tab) by mouth once a day for 1 month. 10/18/17  Yes Deboraha Sprang, MD  atorvastatin (LIPITOR) 10 MG tablet Take 10 mg by mouth every evening.    Yes [provider]  calcium acetate (PHOSLO) 667 MG capsule Take 1,334 mg by mouth 3 (three) times daily with meals.   Yes [provider]  carvedilol (COREG) 6.25 MG tablet Take 1 tablet (6.25 mg total) by mouth 2 (two) times daily with a meal. 06/19/17  Yes Sainani, Belia Heman, MD  Cholecalciferol (VITAMIN D3) 2000 units TABS Take 2,000 Units by mouth daily.    Yes [provider]  febuxostat (ULORIC) 40 MG tablet Take 40 mg by mouth every evening.    Yes [provider]  furosemide (LASIX) 80 MG tablet Take 80 mg by mouth daily.   Yes [provider]  montelukast (SINGULAIR) 10 MG tablet Take 10 mg by mouth every evening.    Yes [provider]  multivitamin (RENA-VIT) TABS tablet Take 1 tablet by mouth daily.   Yes [provider]  pantoprazole (PROTONIX) 40 MG tablet Take 40 mg by mouth daily.   Yes [provider]  predniSONE (DELTASONE) 10 MG tablet Take 30 mg x 1 week, take 20 mg x 1 week, take 10 mg x 1 week, then off 10/11/17  Yes [provider]  umeclidinium-vilanterol (ANORO ELLIPTA) 62.5-25 MCG/INH AEPB Inhale 1 puff  into the lungs daily.   Yes [provider]  warfarin (COUMADIN) 3 MG tablet Take 3 mg by mouth every evening.    Yes [provider]  acetaminophen (TYLENOL) 500 MG tablet Take 1,000 mg by mouth daily as needed for moderate pain or headache.    [provider]  albuterol (PROVENTIL HFA;VENTOLIN HFA) 108 (90 BASE) MCG/ACT inhaler Inhale 2 puffs into the lungs every 4 (four) hours as needed for wheezing or shortness of breath.    [provider]  azelastine (OPTIVAR) 0.05 % ophthalmic solution Place 1 drop into both eyes 2 (two) times daily as needed (allergies).    [provider]  dextromethorphan-guaiFENesin (MUCINEX DM) 30-600 MG 12hr tablet Take 1 tablet by mouth at  bedtime as needed for cough.    [provider]  DiphenhydrAMINE HCl, Sleep, (ZZZQUIL PO) Take 25 mg by mouth at bedtime as needed (sleep).     [provider]    Inpatient Medications: Scheduled Meds: . amiodarone  400 mg Oral BID  . atorvastatin  10 mg Oral QPM  . calcium acetate  1,334 mg Oral TID WC  . cholecalciferol  2,000 Units Oral Daily  . docusate sodium  100 mg Oral BID  . febuxostat  40 mg Oral QPM  . levalbuterol  1.25 mg Nebulization Once  . montelukast  10 mg Oral QPM  . multivitamin  1 tablet Oral Daily  . pantoprazole  40 mg Oral Daily  . sodium chloride flush  3 mL Intravenous Q12H  . umeclidinium-vilanterol  1 puff Inhalation Daily  . warfarin  3 mg Oral QPM  . Warfarin - Pharmacist Dosing Inpatient   Does not apply q1800   Continuous Infusions: . sodium chloride     PRN Meds: acetaminophen, albuterol, bisacodyl, guaiFENesin **AND** dextromethorphan, HYDROcodone-acetaminophen, ketorolac, ondansetron **OR** ondansetron (ZOFRAN) IV, sodium chloride flush, traZODone  Allergies:   Allergies  Allergen Reactions  . Ace Inhibitors Nausea And Vomiting  . Amoxicillin-Pot Clavulanate Nausea And Vomiting       . Other Nausea And Vomiting    Anti-inflammatories    Social History:   Social History   Social History  . Marital status: Married    Spouse name: N/A  . Number of children: N/A  . Years of education: N/A   Occupational History  . Not on file.   Social History Main Topics  . Smoking status: Former Smoker    Packs/day: 1.00    Years: 30.00    Types: Cigarettes    Quit date: 05/19/2009  . Smokeless tobacco: Never Used  . Alcohol use No  . Drug use: No  . Sexual activity: Yes   Other Topics Concern  . Not on file   Social History Narrative  . No narrative on file     Family History:  Family History  Problem Relation Age of Onset  . Liver disease Mother   . Heart disease Mother   . Hypertension Mother   . Pancreatic  cancer Father     ROS:  Review of Systems  Constitutional: Positive for malaise/fatigue. Negative for chills, diaphoresis, fever and weight loss.  HENT: Negative for congestion.   Eyes: Negative for discharge and redness.  Respiratory: Positive for shortness of breath. Negative for cough, hemoptysis, sputum production and wheezing.   Cardiovascular: Positive for chest pain, palpitations and leg swelling. Negative for orthopnea, claudication and PND.  Gastrointestinal: Positive for abdominal pain. Negative for blood in stool, heartburn, melena, nausea and vomiting.  Genitourinary: Negative for hematuria.  Musculoskeletal: Negative for falls and myalgias.  Skin: Positive for rash.  Neurological: Positive for weakness. Negative for dizziness, tingling, tremors, sensory change, speech change, focal weakness and loss of consciousness.  Endo/Heme/Allergies: Does not bruise/bleed easily.  Psychiatric/Behavioral: Negative for substance abuse. The patient is not nervous/anxious.   All other systems reviewed and are negative.     Physical Exam/Data:   Vitals:   10/20/17 0700 10/20/17 0800 10/20/17 0900 10/20/17 1000  BP: 93/79 95/82 93/79  96/75  Pulse: (!) 116 (!) 116 (!) 115 (!) 116  Resp: (!) 22 (!) 26 (!) 26 (!) 26  Temp:  98.7 F (37.1 C)    TempSrc:  Oral    SpO2: 97% 99% 97% 97%  Weight:      Height:        Intake/Output Summary (Last 24 hours) at 10/20/17 1143 Last data filed at 10/20/17 0800  Gross per 24 hour  Intake              987 ml  Output                0 ml  Net              987 ml   Filed Weights   10/19/17 1414 10/19/17 1930  Weight: 250 lb (113.4 kg) 250 lb 7.1 oz (113.6 kg)   Body mass index is 36.98 kg/m.   Physical Exam: General: Well developed, well nourished, in no acute distress. Head: Normocephalic, atraumatic, sclera non-icteric, no xanthomas, nares without discharge. Neck: Negative for carotid bruits. JVD not elevated. Lungs: Clear bilaterally to  auscultation without wheezes, rales, or rhonchi. Breathing is unlabored. Heart: Tachycardic with S1 S2. No murmurs, rubs, or gallops appreciated. Abdomen: Soft, non-tender, non-distended with normoactive bowel sounds. No hepatomegaly. No rebound/guarding. No obvious abdominal masses. Msk:  Strength and tone appear normal for age. Extremities: No clubbing or cyanosis. No edema. Distal pedal pulses are 2+ and equal bilaterally. Rash noted.  Neuro: Alert and oriented X 3. No facial asymmetry. No focal deficit. Moves all extremities spontaneously. Psych:  Responds to questions appropriately with a normal affect.   EKG:  The EKG was personally reviewed and demonstrates: Atrial flutter with RVR, 120 bpm, poor R progression, nonspecific st/t changes Telemetry:  Telemetry was personally reviewed and demonstrates: atrial flutter with RVR with 2:1 conduction, 116 bpm  Weights: Filed Weights   10/19/17 1414 10/19/17 1930  Weight: 250 lb (113.4 kg) 250 lb 7.1 oz (113.6 kg)    Relevant CV Studies: Echo 05/2017: Study Conclusions  - Left ventricle: The cavity size was normal. Wall thickness was   normal. Systolic function was moderately reduced. The estimated   ejection fraction was in the range of 35% to 40%. Regional wall   motion abnormalities cannot be excluded. - Mitral valve: There was mild regurgitation. - Right ventricle: The cavity size was mildly dilated. Wall   thickness was normal.  Laboratory Data:  Chemistry Recent Labs Lab 10/18/17 1030 10/19/17 1446 10/20/17 0206  NA 141 136 136  K 5.2 4.1 5.2*  CL 94* 95* 93*  CO2 25 27 27   GLUCOSE 97 108* 151*  BUN 57* 35* 49*  CREATININE 5.80* 4.09* 5.59*  CALCIUM 8.2* 8.0* 7.9*  GFRNONAA 9* 13* 9*  GFRAA 10* 15* 10*  ANIONGAP  --  14 16*     Recent Labs Lab 10/18/17 1030 10/19/17 1446  PROT 6.5 6.9  ALBUMIN 4.2 3.7  AST 9  16  ALT 22 24  ALKPHOS 65 45  BILITOT 0.5 1.1   Hematology Recent Labs Lab 10/18/17 1030  10/19/17 1446 10/20/17 0206  WBC 22.3* 20.4* 19.7*  RBC 4.00* 4.17* 3.99*  HGB 12.0* 12.4* 12.1*  HCT 37.2* 39.1* 37.7*  MCV 93 93.9 94.5  MCH 30.0 29.9 30.2  MCHC 32.3 31.8* 32.0  RDW 16.3* 17.7* 17.3*  PLT 159 186 113*   Cardiac Enzymes Recent Labs Lab 10/19/17 1446 10/19/17 1937 10/20/17 0206 10/20/17 0758  TROPONINI 0.05* 0.05* 0.05* 0.05*   No results for input(s): TROPIPOC in the last 168 hours.  BNPNo results for input(s): BNP, PROBNP in the last 168 hours.  DDimer No results for input(s): DDIMER in the last 168 hours.  Radiology/Studies:  Ct Angio Chest Pe W And/or Wo Contrast  Result Date: 10/19/2017 IMPRESSION: 1. Negative for pulmonary embolus. 2. Aortic atherosclerosis (ICD10-170.0). Severe coronary artery calcification. Cardiac enlargement. Electronically Signed   By: Lorin Picket M.D.   On: 10/19/2017 16:23   Dg Chest Portable 1 View  Result Date: 10/19/2017 IMPRESSION: No acute findings. Electronically Signed   By: Lorin Picket M.D.   On: 10/19/2017 14:45    Assessment and Plan:   1. Atrial flutter with RVR: -He is quite symptomatic from his arrhythmia -His heart rate is likely playing a role in his chest tightness, SOB, and hypotension -Will plan for DCCV on 10/26 with Dr. Fletcher Anon (have spoken with specials and anaesthesia, orders are placed) -INR therapeutic, continue Coumadin per pharmacy  -Continue amiodarone  -Coreg on hold given hypotension, restart when able -CHADS2VASc at least 5 (CHF, HTN, age x 2, vascular disease)  2. CAD/elevated troponin: -No chest pain currently -Troponin minimally elevated likely in the setting of supply demand ischemia 2/2 tachycardia with underlying CAD -No plans for heparin gtt or inpatient ischemic evaluation at this time given he is anticoagulated with Coumadin -Consider outpatient stress testing -Coumadin in place of ASA  3. ICM: -He does not appear volume up -Volume managed by HD -BP precludes BB at  this time -Not on ACEi/ARB/ARNI 2/2 CKD and BP -Not on spiro 2/2 BP -Recent echo as above, no need to repeat at this time   For questions or updates, please contact [facility] Please consult [email or web address] for contact info under Cardiology/STEMI.   Signed, Christell Faith, PA-C [facility] Pager: [phone] 10/20/2017, 11:43 AM

## 2017-10-20 NOTE — Progress Notes (Signed)
ANTICOAGULATION CONSULT NOTE - Initial Consult  Pharmacy Consult for warfarin Indication: atrial fibrillation  Allergies  Allergen Reactions  . Ace Inhibitors Nausea And Vomiting  . Amoxicillin-Pot Clavulanate Nausea And Vomiting       . Other Nausea And Vomiting    Anti-inflammatories    Patient Measurements: Height: 5\' 9"  (175.3 cm) Weight: 250 lb 7.1 oz (113.6 kg) IBW/kg (Calculated) : 70.7 Heparin Dosing Weight: 113.6 kg  Vital Signs: Temp: 97.9 F (36.6 C) (10/24 1930) Temp Source: Oral (10/24 1930) BP: 96/74 (10/24 1830) Pulse Rate: 117 (10/24 1830)  Labs:  Recent Labs  10/18/17 1030 10/19/17 1446 10/19/17 1713 10/19/17 1937  HGB 12.0* 12.4*  --   --   HCT 37.2* 39.1*  --   --   PLT 159 186  --   --   APTT  --   --  37*  --   LABPROT 30.8*  --  27.4*  --   INR 3.2*  --  2.57  --   CREATININE 5.80* 4.09*  --   --   TROPONINI  --  0.05*  --  0.05*    Estimated Creatinine Clearance: 19.4 mL/min (A) (by C-G formula based on SCr of 4.09 mg/dL (H)).   Medical History: Past Medical History:  Diagnosis Date  . Arthritis   . Chronic kidney disease    requires dialysis  . COPD (chronic obstructive pulmonary disease) ([place])   . GERD (gastroesophageal reflux disease)   . Hyperlipidemia   . Hypertension   . Prostate cancer ([place])   . Shortness of breath dyspnea     Medications:  Scheduled:  . amiodarone  400 mg Oral BID  . atorvastatin  10 mg Oral QPM  . calcium acetate  1,334 mg Oral TID WC  . cholecalciferol  2,000 Units Oral Daily  . docusate sodium  100 mg Oral BID  . febuxostat  40 mg Oral QPM  . levalbuterol  1.25 mg Nebulization Once  . montelukast  10 mg Oral QPM  . multivitamin  1 tablet Oral Daily  . pantoprazole  40 mg Oral Daily  . umeclidinium-vilanterol  1 puff Inhalation Daily  . warfarin  3 mg Oral QPM  . Warfarin - Pharmacist Dosing Inpatient   Does not apply q1800    Assessment: Patient admitted for CP/SOB was found to be  septic. Is anticoagulated w/ warfarin for chronic afib. Warfarin PTA: 3 mg daily at 6 pm.  10/23 INR 3.2 10/24 INR 2.57 INR is currently in therapeutic range.  Goal of Therapy:  INR 2-3 Monitor platelets by anticoagulation protocol: Yes   Plan:  Since INR is in therapeutic range will continue patient's warfarin home regimen. Will restart warfarin 3 mg daily @ 6 pm. Will monitor CBC/INRs daily while on warfarin.  Tobie Lords, PharmD, BCPS Clinical Pharmacist 10/20/2017

## 2017-10-20 NOTE — Care Management Note (Signed)
Case Management Note  Patient Details  Name: Maurice Peterson MRN: [id number] Date of Birth: 08-Feb-1941  Subjective/Objective:                 Placed in icu stepdown for sepsis for close monitoring.  Chronic dialysis. [facility]/MWF/[address]   Action/Plan:  Notified Elvera Bicker with [facility] of admission   Expected Discharge Date:  10/22/17               Expected Discharge Plan:     In-House Referral:     Discharge planning Services     Post Acute Care Choice:    Choice offered to:     DME Arranged:    DME Agency:     HH Arranged:    [place] Agency:     Status of Service:     If discussed at [employer], dates discussed:    Additional Comments:  Katrina Stack, RN 10/20/2017, 9:18 AM

## 2017-10-20 NOTE — Progress Notes (Signed)
Name: Maurice Peterson MRN: [id number] DOB: Sep 03, 1941    BRIEF PATIENT DESCRIPTION:  76 year old male admitted 10/25 with chest pain, acute respiratory failure, and suspected sepsis due to hypotension   SIGNIFICANT EVENTS  10/24 Patient admitted to the SDU for suspected sepsis and chest pain for observation  STUDIES:  06/18/17 Echo>>Left ventricle:the cavity size was normal. Wall thickness was  normal. Systolic function was moderately reduced. The estimated  ejection fraction was in the range of 35% to 40%. 10/19/17 CTPE>>Negative for pulmonary embolus.  SUBJECTIVE:  No complaints at this time.  VITAL SIGNS: Temp:  [97.9 F (36.6 C)-98.7 F (37.1 C)] 98.7 F (37.1 C) (10/25 0800) Pulse Rate:  [114-121] 116 (10/25 1000) Resp:  [16-30] 26 (10/25 1000) BP: (75-110)/(61-82) 96/75 (10/25 1000) SpO2:  [85 %-100 %] 97 % (10/25 1000) Weight:  [113.4 kg (250 lb)-113.6 kg (250 lb 7.1 oz)] 113.6 kg (250 lb 7.1 oz) (10/24 1930)  PHYSICAL EXAMINATION: General: 76 year old male,in no acute distress Neuro:  Awake,Alert and oriented HEENT:  AT,[place],No Jvd, Cardiovascular: atrial flutter with BBB, no M/R/G  Lungs: clear bilaterally, no wheezes, crackles Abdomen: soft, NT, ND, +BS x4 Musculoskeletal: no edema,cyanosis Skin: warm,dry and intact scabbed abrasion left shin    Recent Labs Lab 10/18/17 1030 10/19/17 1446 10/20/17 0206  NA 141 136 136  K 5.2 4.1 5.2*  CL 94* 95* 93*  CO2 25 27 27   BUN 57* 35* 49*  CREATININE 5.80* 4.09* 5.59*  GLUCOSE 97 108* 151*    Recent Labs Lab 10/18/17 1030 10/19/17 1446 10/20/17 0206  HGB 12.0* 12.4* 12.1*  HCT 37.2* 39.1* 37.7*  WBC 22.3* 20.4* 19.7*  PLT 159 186 113*   Ct Angio Chest Pe W And/or Wo Contrast  Result Date: 10/19/2017 CLINICAL DATA:  Chest pain and shortness of breath.  Hypotension. EXAM: CT ANGIOGRAPHY CHEST WITH CONTRAST TECHNIQUE: Multidetector CT imaging of the chest was performed using the standard protocol during bolus  administration of intravenous contrast. Multiplanar CT image reconstructions and MIPs were obtained to evaluate the vascular anatomy. CONTRAST:  75 cc Isovue 370. COMPARISON:  CT abdomen pelvis 05/29/2011. FINDINGS: Cardiovascular: Negative for pulmonary embolus. Atherosclerotic calcification of the arterial vasculature, including severe involvement of coronary arteries. Heart is enlarged. No pericardial effusion. Mediastinum/Nodes: Mediastinal and hilar lymph nodes are not enlarged by CT size criteria. No axillary adenopathy. Esophagus is grossly unremarkable. Lungs/Pleura: Image quality is degraded by expiratory phase imaging. 3 mm subpleural lingular nodule likely a subpleural lymph node. Lungs are otherwise clear. No pleural fluid. Airway is unremarkable. Upper Abdomen: Reflux of contrast into the IVC and hepatic veins. Visualized portions of the liver, gallbladder and adrenal glands are unremarkable. Partially imaged area of presumed fat necrosis along the interpolar right kidney, likely smaller than on 05/29/2011. Focal area of hyperdensity along the upper pole the right kidney and subcentimeter low-attenuation lesion along the medial left kidney are too small to characterize. Visualized portions of the spleen, pancreas, stomach and bowel are grossly unremarkable. Musculoskeletal: No worrisome lytic or sclerotic lesions. Mild anterior wedging of a lower thoracic vertebral body, likely remote. Review of the MIP images confirms the above findings. IMPRESSION: 1. Negative for pulmonary embolus. 2. Aortic atherosclerosis (ICD10-170.0). Severe coronary artery calcification. Cardiac enlargement. Electronically Signed   By: Lorin Picket M.D.   On: 10/19/2017 16:23   Dg Chest Portable 1 View  Result Date: 10/19/2017 CLINICAL DATA:  Shortness of breath and chest pain after dialysis today. EXAM: PORTABLE  CHEST 1 VIEW COMPARISON:  06/20/2017. FINDINGS: Trachea is midline. Heart is enlarged. Lungs are clear. No  pleural fluid. IMPRESSION: No acute findings. Electronically Signed   By: Lorin Picket M.D.   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 Elevated troponin likely secondary to demand ischemia  Leukocytosis possibly related to prednisone no evidence of infection  Hx: Atrial Fibrillation/Flutter  Plan Supplemental O2 for dyspnea or hypoxia Prn bronchodilator therapy  Cardioversion planned for 10/26 per Cardiology Nephrology and Cardiology consulted appreciate input   Continue amiodarone, atorvastatin, and coumadin  Monitor fever curve and WBC  Trend troponin Follow cultures Patient is followed at [facility] for vasculitis  -Will transfer pt to any medsurg with off unit telemetry monitoring today 10/25 once bed becomes available PCCM will sign off if you need further assistance please call on call pager# listed in [place].  Marda Stalker, [place] Pager [phone] (please enter 7 digits) [place] Pager [phone] (please enter 7 digits)  Merton Border, MD PCCM service Mobile [phone] Pager [phone] 10/20/2017 1:18 PM

## 2017-10-20 NOTE — Progress Notes (Signed)
[facility], [name] 10/20/17  Subjective:   Patient is known to our practice from previous admissions.  He presented to the emergency room via EMS from home for complaints of substernal chest pain and shortness of breath. He completed his dialysis treatment uneventfully. Per EMS, patient was hypotensive with systolic in the 00B-70W. Patient states that he has been dealing with a generalized petechial rash over his body since September.  He was seen by dermatology and a biopsy was done which showed vasculitis.  He was placed on prednisone about 3 weeks ago.  He was taking 30 mg daily which was changed to 20 mg daily starting yesterday.  He states his appetite had increased and he was having some fluid retention.  About a month ago he had an episode of gross hematuria.  He was evaluated at [facility]. Renal u/s was done. Hematuria has now resolved.    Objective:  Vital signs in last 24 hours:  Temp:  [97.9 F (36.6 C)-98.7 F (37.1 C)] 98.7 F (37.1 C) (10/25 0800) Pulse Rate:  [114-121] 115 (10/25 0900) Resp:  [16-30] 26 (10/25 0900) BP: (75-110)/(61-82) 93/79 (10/25 0900) SpO2:  [85 %-100 %] 97 % (10/25 0900) Weight:  [113.4 kg (250 lb)-113.6 kg (250 lb 7.1 oz)] 113.6 kg (250 lb 7.1 oz) (10/24 1930)  Weight change:  Filed Weights   10/19/17 1414 10/19/17 1930  Weight: 113.4 kg (250 lb) 113.6 kg (250 lb 7.1 oz)    Intake/Output:    Intake/Output Summary (Last 24 hours) at 10/20/17 0938 Last data filed at 10/20/17 0800  Gross per 24 hour  Intake              987 ml  Output                0 ml  Net              987 ml     Physical Exam: General: NAD, laying in the bed  HEENT Anicteric, moist mucus membranes  Neck Supple, no JVD  Pulm/lungs Mild diffuse wheezing and crackles  CVS/Heart Regular, tachycardic, a flutter  Abdomen:  Soft, NT  Extremities: + dependent edema  Neurologic: Alert, oriented  Skin: Petechial rash over legs, thighs,  torso  Access: Left forearm AVF, good bruit       Basic Metabolic Panel:   Recent Labs Lab 10/18/17 1030 10/19/17 1446 10/20/17 0206  NA 141 136 136  K 5.2 4.1 5.2*  CL 94* 95* 93*  CO2 25 27 27   GLUCOSE 97 108* 151*  BUN 57* 35* 49*  CREATININE 5.80* 4.09* 5.59*  CALCIUM 8.2* 8.0* 7.9*     CBC:  Recent Labs Lab 10/18/17 1030 10/19/17 1446 10/20/17 0206  WBC 22.3* 20.4* 19.7*  NEUTROABS 19.6* 18.2*  --   HGB 12.0* 12.4* 12.1*  HCT 37.2* 39.1* 37.7*  MCV 93 93.9 94.5  PLT 159 186 113*     No results found for: HEPBSAG, HEPBSAB, HEPBIGM    Microbiology:  Recent Results (from the past 240 hour(s))  Blood culture (routine x 2)     Status: None (Preliminary result)   Collection Time: 10/19/17  4:30 PM  Result Value Ref Range Status   Specimen Description BLOOD RIGHT ARM  Final   Special Requests   Final    BOTTLES DRAWN AEROBIC AND ANAEROBIC Blood Culture adequate volume   Culture NO GROWTH < 24 HOURS  Final   Report Status PENDING  Incomplete  Blood culture (routine x 2)     Status: None (Preliminary result)   Collection Time: 10/19/17  7:37 PM  Result Value Ref Range Status   Specimen Description BLOOD RIGHT SHOULDER  Final   Special Requests   Final    BOTTLES DRAWN AEROBIC AND ANAEROBIC Blood Culture adequate volume   Culture NO GROWTH < 12 HOURS  Final   Report Status PENDING  Incomplete  MRSA PCR Screening     Status: None   Collection Time: 10/19/17  7:38 PM  Result Value Ref Range Status   MRSA by PCR NEGATIVE NEGATIVE Final    Comment:        The GeneXpert MRSA Assay (FDA approved for NASAL specimens only), is one component of a comprehensive MRSA colonization surveillance program. It is not intended to diagnose MRSA infection nor to guide or monitor treatment for MRSA infections.     Coagulation Studies:  Recent Labs  10/18/17 1030 10/19/17 1713 10/20/17 0206  LABPROT 30.8* 27.4* 24.6*  INR 3.2* 2.57 2.24     Urinalysis: No results for input(s): COLORURINE, LABSPEC, PHURINE, GLUCOSEU, HGBUR, BILIRUBINUR, KETONESUR, PROTEINUR, UROBILINOGEN, NITRITE, LEUKOCYTESUR in the last 72 hours.  Invalid input(s): APPERANCEUR    Imaging: Ct Angio Chest Pe W And/or Wo Contrast  Result Date: 10/19/2017 CLINICAL DATA:  Chest pain and shortness of breath.  Hypotension. EXAM: CT ANGIOGRAPHY CHEST WITH CONTRAST TECHNIQUE: Multidetector CT imaging of the chest was performed using the standard protocol during bolus administration of intravenous contrast. Multiplanar CT image reconstructions and MIPs were obtained to evaluate the vascular anatomy. CONTRAST:  75 cc Isovue 370. COMPARISON:  CT abdomen pelvis 05/29/2011. FINDINGS: Cardiovascular: Negative for pulmonary embolus. Atherosclerotic calcification of the arterial vasculature, including severe involvement of coronary arteries. Heart is enlarged. No pericardial effusion. Mediastinum/Nodes: Mediastinal and hilar lymph nodes are not enlarged by CT size criteria. No axillary adenopathy. Esophagus is grossly unremarkable. Lungs/Pleura: Image quality is degraded by expiratory phase imaging. 3 mm subpleural lingular nodule likely a subpleural lymph node. Lungs are otherwise clear. No pleural fluid. Airway is unremarkable. Upper Abdomen: Reflux of contrast into the IVC and hepatic veins. Visualized portions of the liver, gallbladder and adrenal glands are unremarkable. Partially imaged area of presumed fat necrosis along the interpolar right kidney, likely smaller than on 05/29/2011. Focal area of hyperdensity along the upper pole the right kidney and subcentimeter low-attenuation lesion along the medial left kidney are too small to characterize. Visualized portions of the spleen, pancreas, stomach and bowel are grossly unremarkable. Musculoskeletal: No worrisome lytic or sclerotic lesions. Mild anterior wedging of a lower thoracic vertebral body, likely remote. Review of the  MIP images confirms the above findings. IMPRESSION: 1. Negative for pulmonary embolus. 2. Aortic atherosclerosis (ICD10-170.0). Severe coronary artery calcification. Cardiac enlargement. Electronically Signed   By: Lorin Picket M.D.   On: 10/19/2017 16:23   Dg Chest Portable 1 View  Result Date: 10/19/2017 CLINICAL DATA:  Shortness of breath and chest pain after dialysis today. EXAM: PORTABLE CHEST 1 VIEW COMPARISON:  06/20/2017. FINDINGS: Trachea is midline. Heart is enlarged. Lungs are clear. No pleural fluid. IMPRESSION: No acute findings. Electronically Signed   By: Lorin Picket M.D.   On: 10/19/2017 14:45     Medications:    . amiodarone  400 mg Oral BID  . atorvastatin  10 mg Oral QPM  . calcium acetate  1,334 mg Oral TID WC  . cholecalciferol  2,000 Units Oral Daily  . docusate sodium  100  mg Oral BID  . febuxostat  40 mg Oral QPM  . levalbuterol  1.25 mg Nebulization Once  . montelukast  10 mg Oral QPM  . multivitamin  1 tablet Oral Daily  . pantoprazole  40 mg Oral Daily  . umeclidinium-vilanterol  1 puff Inhalation Daily  . warfarin  3 mg Oral QPM  . Warfarin - Pharmacist Dosing Inpatient   Does not apply q1800   acetaminophen, albuterol, bisacodyl, guaiFENesin **AND** dextromethorphan, HYDROcodone-acetaminophen, ketorolac, ondansetron **OR** ondansetron (ZOFRAN) IV, traZODone  Assessment/ Plan:  76 y.o. 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  1. End-stage renal disease We will plan to dialyze the patient tomorrow and keep him on his schedule. Patient does have some volume overload in the form of lower extremity edema Ultrafiltration is limited by low BP 2.  Anemia of  chronic kidney disease Hemoglobin 12.1 We will hold Procrit for now 3.  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  Currently on IV Solu-Medrol. 4.  Secondary hyperparathyroidism Continue PhosLo 5.  Hypotension from atrial flutter Plan for cardioversion tomorrow morning by cardiology     LOS: 1 [facility] 10/25/20189:38 [place], [place]

## 2017-10-20 NOTE — Progress Notes (Signed)
Report called to [facility] on 2A, pt will transfer via wheelchair, chart, meds, and belongings to transfer with him.  No Supp O2 needed, VSS, pt in 3 to 1 aflutter all shift.  No s/sx distress

## 2017-10-21 ENCOUNTER — Encounter
Admission: EM | Disposition: A | Discharge status: Home / Self Care | Payer: Self-pay | Source: Home / Self Care | Attending: Internal Medicine

## 2017-10-21 ENCOUNTER — Other Ambulatory Visit: Payer: Self-pay

## 2017-10-21 ENCOUNTER — Inpatient Hospital Stay: Payer: Medicare Other | Admitting: Anesthesiology

## 2017-10-21 ENCOUNTER — Encounter: Payer: Self-pay | Admitting: Anesthesiology

## 2017-10-21 DIAGNOSIS — I484 Atypical atrial flutter: Secondary | ICD-10-CM

## 2017-10-21 HISTORY — PX: CARDIOVERSION: EP1203

## 2017-10-21 LAB — BASIC METABOLIC PANEL
ANION GAP: 14 (ref 5–15)
BUN: 91 mg/dL — ABNORMAL HIGH (ref 6–20)
CALCIUM: 8.1 mg/dL — AB (ref 8.9–10.3)
CO2: 25 mmol/L (ref 22–32)
Chloride: 92 mmol/L — ABNORMAL LOW (ref 101–111)
Creatinine, Ser: 7.31 mg/dL — ABNORMAL HIGH (ref 0.61–1.24)
GFR, EST AFRICAN AMERICAN: 7 mL/min — AB (ref >60)
GFR, EST NON AFRICAN AMERICAN: 6 mL/min — AB (ref >60)
GLUCOSE: 102 mg/dL — AB (ref 65–99)
POTASSIUM: 5.7 mmol/L — AB (ref 3.5–5.1)
Sodium: 131 mmol/L — ABNORMAL LOW (ref 135–145)

## 2017-10-21 LAB — CBC WITH DIFFERENTIAL/PLATELET
BASOS ABS: 0 10*3/uL (ref 0–0.1)
BASOS PCT: 0 %
EOS PCT: 0 %
Eosinophils Absolute: 0 10*3/uL (ref 0–0.7)
HCT: 37.3 % — ABNORMAL LOW (ref 40.0–52.0)
Hemoglobin: 11.7 g/dL — ABNORMAL LOW (ref 13.0–18.0)
LYMPHS PCT: 6 %
Lymphs Abs: 1.2 10*3/uL (ref 1.0–3.6)
MCH: 29.6 pg (ref 26.0–34.0)
MCHC: 31.4 g/dL — ABNORMAL LOW (ref 32.0–36.0)
MCV: 94.5 fL (ref 80.0–100.0)
MONO ABS: 1.4 10*3/uL — AB (ref 0.2–1.0)
Monocytes Relative: 6 %
NEUTROS ABS: 18.6 10*3/uL — AB (ref 1.4–6.5)
Neutrophils Relative %: 88 %
PLATELETS: 126 10*3/uL — AB (ref 150–440)
RBC: 3.95 MIL/uL — AB (ref 4.40–5.90)
RDW: 17.8 % — AB (ref 11.5–14.5)
WBC: 21.2 10*3/uL — AB (ref 3.8–10.6)

## 2017-10-21 LAB — PROTIME-INR
INR: 2.17
PROTHROMBIN TIME: 24 s — AB (ref 11.4–15.2)

## 2017-10-21 LAB — GLUCOSE, CAPILLARY: Glucose-Capillary: 93 mg/dL (ref 65–99)

## 2017-10-21 SURGERY — CARDIOVERSION (CATH LAB)
Anesthesia: General

## 2017-10-21 MED ORDER — PROPOFOL 10 MG/ML IV BOLUS
INTRAVENOUS | Status: DC | PRN
  Administered 2017-10-21 (×2): 20 mg via INTRAVENOUS

## 2017-10-21 MED ORDER — SODIUM POLYSTYRENE SULFONATE 15 GM/60ML PO SUSP
15.0000 g | Freq: Once | ORAL | Status: AC
  Administered 2017-10-21: 15 g via ORAL
  Filled 2017-10-21: qty 60

## 2017-10-21 MED ORDER — PREDNISONE 20 MG PO TABS
20.0000 mg | ORAL_TABLET | Freq: Every day | ORAL | Status: DC
  Administered 2017-10-22 – 2017-10-25 (×4): 20 mg via ORAL
  Filled 2017-10-21 (×7): qty 1

## 2017-10-21 MED ORDER — PHENYLEPHRINE HCL 10 MG/ML IJ SOLN
INTRAMUSCULAR | Status: DC | PRN
  Administered 2017-10-21: 200 ug via INTRAVENOUS
  Administered 2017-10-21 (×4): 100 ug via INTRAVENOUS

## 2017-10-21 MED ORDER — SODIUM POLYSTYRENE SULFONATE 15 GM/60ML PO SUSP
15.0000 g | Freq: Once | ORAL | Status: DC
  Filled 2017-10-21: qty 60

## 2017-10-21 MED ORDER — NYSTATIN 100000 UNIT/ML MT SUSP
5.0000 mL | Freq: Four times a day (QID) | OROMUCOSAL | Status: DC
  Administered 2017-10-21 – 2017-10-26 (×16): 500000 [IU] via ORAL
  Filled 2017-10-21 (×17): qty 5

## 2017-10-21 MED ORDER — AMIODARONE HCL 200 MG PO TABS: ORAL_TABLET | ORAL | 0 refills | Status: DC

## 2017-10-21 NOTE — Anesthesia Procedure Notes (Signed)
Date/Time: 10/21/2017 7:49 AM Performed by: Johnna Acosta Pre-anesthesia Checklist: Patient identified, Emergency Drugs available, Suction available, Patient being monitored and Timeout performed Patient Re-evaluated:Patient Re-evaluated prior to induction Oxygen Delivery Method: Nasal cannula

## 2017-10-21 NOTE — Progress Notes (Signed)
Progress Note  Patient Name: Maurice Peterson Date of Encounter: 10/21/2017  Primary Cardiologist: Dr. Nehemiah Massed /Dr. Caryl Comes (EP)  Subjective   The patient underwent successful cardioversion to sinus rhythm this morning.  He feels significantly better overall.  Inpatient Medications    Scheduled Meds: . amiodarone  400 mg Oral BID  . atorvastatin  10 mg Oral QPM  . calcium acetate  1,334 mg Oral TID WC  . cholecalciferol  2,000 Units Oral Daily  . docusate sodium  100 mg Oral BID  . febuxostat  40 mg Oral QPM  . levalbuterol  1.25 mg Nebulization Once  . montelukast  10 mg Oral QPM  . multivitamin  1 tablet Oral Daily  . pantoprazole  40 mg Oral Daily  . sodium chloride flush  3 mL Intravenous Q12H  . umeclidinium-vilanterol  1 puff Inhalation Daily  . warfarin  3 mg Oral QPM  . Warfarin - Pharmacist Dosing Inpatient   Does not apply q1800   Continuous Infusions: . sodium chloride     PRN Meds: acetaminophen, albuterol, bisacodyl, guaiFENesin **AND** dextromethorphan, HYDROcodone-acetaminophen, ketorolac, ondansetron **OR** ondansetron (ZOFRAN) IV, sodium chloride flush   Vital Signs    Vitals:   10/21/17 0816 10/21/17 0829 10/21/17 0842 10/21/17 1011  BP: 90/74 91/76 92/72  (!) 83/59  Pulse: 77 78 83 77  Resp: (!) 22 16 17  (!) 21  Temp:      TempSrc:      SpO2: 100% 98% 99% 100%  Weight:      Height:        Intake/Output Summary (Last 24 hours) at 10/21/17 1150 Last data filed at 10/21/17 0808  Gross per 24 hour  Intake              400 ml  Output                0 ml  Net              400 ml   Filed Weights   10/19/17 1414 10/19/17 1930 10/21/17 0414  Weight: 250 lb (113.4 kg) 250 lb 7.1 oz (113.6 kg) 253 lb 4.8 oz (114.9 kg)    Telemetry    Currently normal sinus rhythm.- Personally Reviewed  ECG    Atrial flutter converted to sinus rhythm with cardioversion.- Personally Reviewed  Physical Exam   GEN: No acute distress.   Neck: No JVD Cardiac:  RRR, no rubs, or gallops.  1 out of 6 systolic ejection murmur at the base. Respiratory: Clear to auscultation bilaterally. GI: Soft, nontender, non-distended  MS: No edema; No deformity. Neuro:  Nonfocal  Psych: Normal affect   Labs    Chemistry Recent Labs Lab 10/18/17 1030 10/19/17 1446 10/20/17 0206 10/21/17 0344  NA 141 136 136 131*  K 5.2 4.1 5.2* 5.7*  CL 94* 95* 93* 92*  CO2 25 27 27 25   GLUCOSE 97 108* 151* 102*  BUN 57* 35* 49* 91*  CREATININE 5.80* 4.09* 5.59* 7.31*  CALCIUM 8.2* 8.0* 7.9* 8.1*  PROT 6.5 6.9  --   --   ALBUMIN 4.2 3.7  --   --   AST 9 16  --   --   ALT 22 24  --   --   ALKPHOS 65 45  --   --   BILITOT 0.5 1.1  --   --   GFRNONAA 9* 13* 9* 6*  GFRAA 10* 15* 10* 7*  ANIONGAP  --  14 16*  14     Hematology Recent Labs Lab 10/19/17 1446 10/20/17 0206 10/21/17 0344  WBC 20.4* 19.7* 21.2*  RBC 4.17* 3.99* 3.95*  HGB 12.4* 12.1* 11.7*  HCT 39.1* 37.7* 37.3*  MCV 93.9 94.5 94.5  MCH 29.9 30.2 29.6  MCHC 31.8* 32.0 31.4*  RDW 17.7* 17.3* 17.8*  PLT 186 113* 126*    Cardiac Enzymes Recent Labs Lab 10/19/17 1937 10/20/17 0206 10/20/17 0758 10/20/17 1427  TROPONINI 0.05* 0.05* 0.05* 0.07*   No results for input(s): TROPIPOC in the last 168 hours.   BNPNo results for input(s): BNP, PROBNP in the last 168 hours.   DDimer No results for input(s): DDIMER in the last 168 hours.   Radiology    Ct Angio Chest Pe W And/or Wo Contrast  Result Date: 10/19/2017 CLINICAL DATA:  Chest pain and shortness of breath.  Hypotension. EXAM: CT ANGIOGRAPHY CHEST WITH CONTRAST TECHNIQUE: Multidetector CT imaging of the chest was performed using the standard protocol during bolus administration of intravenous contrast. Multiplanar CT image reconstructions and MIPs were obtained to evaluate the vascular anatomy. CONTRAST:  75 cc Isovue 370. COMPARISON:  CT abdomen pelvis 05/29/2011. FINDINGS: Cardiovascular: Negative for pulmonary embolus. Atherosclerotic  calcification of the arterial vasculature, including severe involvement of coronary arteries. Heart is enlarged. No pericardial effusion. Mediastinum/Nodes: Mediastinal and hilar lymph nodes are not enlarged by CT size criteria. No axillary adenopathy. Esophagus is grossly unremarkable. Lungs/Pleura: Image quality is degraded by expiratory phase imaging. 3 mm subpleural lingular nodule likely a subpleural lymph node. Lungs are otherwise clear. No pleural fluid. Airway is unremarkable. Upper Abdomen: Reflux of contrast into the IVC and hepatic veins. Visualized portions of the liver, gallbladder and adrenal glands are unremarkable. Partially imaged area of presumed fat necrosis along the interpolar right kidney, likely smaller than on 05/29/2011. Focal area of hyperdensity along the upper pole the right kidney and subcentimeter low-attenuation lesion along the medial left kidney are too small to characterize. Visualized portions of the spleen, pancreas, stomach and bowel are grossly unremarkable. Musculoskeletal: No worrisome lytic or sclerotic lesions. Mild anterior wedging of a lower thoracic vertebral body, likely remote. Review of the MIP images confirms the above findings. IMPRESSION: 1. Negative for pulmonary embolus. 2. Aortic atherosclerosis (ICD10-170.0). Severe coronary artery calcification. Cardiac enlargement. Electronically Signed   By: Lorin Picket M.D.   On: 10/19/2017 16:23   Dg Chest Portable 1 View  Result Date: 10/19/2017 CLINICAL DATA:  Shortness of breath and chest pain after dialysis today. EXAM: PORTABLE CHEST 1 VIEW COMPARISON:  06/20/2017. FINDINGS: Trachea is midline. Heart is enlarged. Lungs are clear. No pleural fluid. IMPRESSION: No acute findings. Electronically Signed   By: Lorin Picket M.D.   On: 10/19/2017 14:45    Cardiac Studies     Patient Profile     75 y.o. male with history of coronary artery disease status post remote inferior MI status post stenting,  chronic systolic heart failure due to ischemic cardiomyopathy, paroxysmal atrial flutter on anticoagulation with warfarin and end-stage renal disease on hemodialysis who presented with atrial flutter with rapid ventricular response with associated hypotension.  Assessment & Plan    1.  Atrial flutter with RVR: Status post successful cardioversion to sinus rhythm this morning.  I recommend continuing amiodarone 400 mg twice daily for another week followed by 200 mg twice daily.  Continue anticoagulation with warfarin with a target INR between 2 and 3.  INR should be checked shortly after discharge due to amiodarone interaction with  warfarin.  2.  Coronary artery disease: No anginal symptoms.  Continue medical therapy.  3.  Hypotension: Sepsis was suspected but no source has been identified.  He has low blood pressure at baseline and it is possible that this worsened in the setting of atrial flutter.  Continue to monitor and can consider Midodrin as a last resort.  For questions or updates, please contact [place] Please consult [email or web address] for contact info under Cardiology/STEMI.      Signed, Kathlyn Sacramento, MD  10/21/2017, 11:50 AM

## 2017-10-21 NOTE — Transfer of Care (Signed)
Immediate Anesthesia Transfer of Care Note  Patient: Maurice Peterson  Procedure(s) Performed: CARDIOVERSION (N/A )  Patient Location: PACU  Anesthesia Type:General  Level of Consciousness: sedated  Airway & Oxygen Therapy: Patient Spontanous Breathing and Patient connected to nasal cannula oxygen  Post-op Assessment: Report given to RN and Post -op Vital signs reviewed and stable  Post vital signs: Reviewed and stable  Last Vitals:  Vitals:   10/21/17 0414 10/21/17 0709  BP: (!) 86/55 (!) 87/49  Pulse: (!) 105 (!) 106  Resp: 18 20  Temp: 36.5 C 37.1 C  SpO2: 99% 98%    Last Pain:  Vitals:   10/21/17 0414  TempSrc: Oral  PainSc:          Complications: No apparent anesthesia complications

## 2017-10-21 NOTE — CV Procedure (Signed)
Cardioversion note: A standard informed consent was obtained. Timeout was performed. The pads were placed in the anterior posterior fashion. The patient was given propofol by the anesthesia team.  Phenylephrine boluses were given to support blood pressure. Successful cardioversion was performed with a 100 J. The patient converted to sinus rhythm. Pre-and post EKGs were reviewed. The patient tolerated the procedure with no immediate complications.  Recommendations: Continue Amiodarone and Warfarin.

## 2017-10-21 NOTE — Progress Notes (Signed)
[facility], [name] 10/21/17  Subjective:   Patient had cardioversion earlier today.  Now in sinus rhythm Unfortunately his left arm AV fistula has clotted He does not feel short of breath Ate lunch without nausea or vomiting   Objective:  Vital signs in last 24 hours:  Temp:  [97.5 F (36.4 C)-98.7 F (37.1 C)] 98.7 F (37.1 C) (10/26 0709) Pulse Rate:  [74-116] 77 (10/26 1011) Resp:  [0-26] 21 (10/26 1011) BP: (76-111)/(49-85) 83/59 (10/26 1011) SpO2:  [94 %-100 %] 100 % (10/26 1011) Weight:  [114.9 kg (253 lb 4.8 oz)] 114.9 kg (253 lb 4.8 oz) (10/26 0414)  Weight change: 1.497 kg (3 lb 4.8 oz) Filed Weights   10/19/17 1414 10/19/17 1930 10/21/17 0414  Weight: 113.4 kg (250 lb) 113.6 kg (250 lb 7.1 oz) 114.9 kg (253 lb 4.8 oz)    Intake/Output:    Intake/Output Summary (Last 24 hours) at 10/21/17 1609 Last data filed at 10/21/17 1413  Gross per 24 hour  Intake              640 ml  Output                0 ml  Net              640 ml     Physical Exam: General: NAD, laying in the bed  HEENT Anicteric, moist mucus membranes  Neck Supple, no JVD  Pulm/lungs Mild diffuse wheezing and crackles  CVS/Heart Regular, tachycardic, a flutter  Abdomen:  Soft, NT  Extremities: + dependent edema  Neurologic: Alert, oriented  Skin: Petechial rash over legs, thighs, torso  Access: Left forearm AVF, clotted.  No bruit       Basic Metabolic Panel:   Recent Labs Lab 10/18/17 1030 10/19/17 1446 10/20/17 0206 10/21/17 0344  NA 141 136 136 131*  K 5.2 4.1 5.2* 5.7*  CL 94* 95* 93* 92*  CO2 25 27 27 25   GLUCOSE 97 108* 151* 102*  BUN 57* 35* 49* 91*  CREATININE 5.80* 4.09* 5.59* 7.31*  CALCIUM 8.2* 8.0* 7.9* 8.1*     CBC:  Recent Labs Lab 10/18/17 1030 10/19/17 1446 10/20/17 0206 10/21/17 0344  WBC 22.3* 20.4* 19.7* 21.2*  NEUTROABS 19.6* 18.2*  --  18.6*  HGB 12.0* 12.4* 12.1* 11.7*  HCT 37.2* 39.1* 37.7* 37.3*  MCV 93  93.9 94.5 94.5  PLT 159 186 113* 126*     No results found for: HEPBSAG, HEPBSAB, HEPBIGM    Microbiology:  Recent Results (from the past 240 hour(s))  Blood culture (routine x 2)     Status: None (Preliminary result)   Collection Time: 10/19/17  4:30 PM  Result Value Ref Range Status   Specimen Description BLOOD RIGHT ARM  Final   Special Requests   Final    BOTTLES DRAWN AEROBIC AND ANAEROBIC Blood Culture adequate volume   Culture NO GROWTH 2 DAYS  Final   Report Status PENDING  Incomplete  Blood culture (routine x 2)     Status: None (Preliminary result)   Collection Time: 10/19/17  7:37 PM  Result Value Ref Range Status   Specimen Description BLOOD RIGHT SHOULDER  Final   Special Requests   Final    BOTTLES DRAWN AEROBIC AND ANAEROBIC Blood Culture adequate volume   Culture NO GROWTH 2 DAYS  Final   Report Status PENDING  Incomplete  MRSA PCR Screening     Status: None   Collection  Time: 10/19/17  7:38 PM  Result Value Ref Range Status   MRSA by PCR NEGATIVE NEGATIVE Final    Comment:        The GeneXpert MRSA Assay (FDA approved for NASAL specimens only), is one component of a comprehensive MRSA colonization surveillance program. It is not intended to diagnose MRSA infection nor to guide or monitor treatment for MRSA infections.     Coagulation Studies:  Recent Labs  10/19/17 1713 10/20/17 0206 10/21/17 0440  LABPROT 27.4* 24.6* 24.0*  INR 2.57 2.24 2.17    Urinalysis: No results for input(s): COLORURINE, LABSPEC, PHURINE, GLUCOSEU, HGBUR, BILIRUBINUR, KETONESUR, PROTEINUR, UROBILINOGEN, NITRITE, LEUKOCYTESUR in the last 72 hours.  Invalid input(s): APPERANCEUR    Imaging: Ct Angio Chest Pe W And/or Wo Contrast  Result Date: 10/19/2017 CLINICAL DATA:  Chest pain and shortness of breath.  Hypotension. EXAM: CT ANGIOGRAPHY CHEST WITH CONTRAST TECHNIQUE: Multidetector CT imaging of the chest was performed using the standard protocol during bolus  administration of intravenous contrast. Multiplanar CT image reconstructions and MIPs were obtained to evaluate the vascular anatomy. CONTRAST:  75 cc Isovue 370. COMPARISON:  CT abdomen pelvis 05/29/2011. FINDINGS: Cardiovascular: Negative for pulmonary embolus. Atherosclerotic calcification of the arterial vasculature, including severe involvement of coronary arteries. Heart is enlarged. No pericardial effusion. Mediastinum/Nodes: Mediastinal and hilar lymph nodes are not enlarged by CT size criteria. No axillary adenopathy. Esophagus is grossly unremarkable. Lungs/Pleura: Image quality is degraded by expiratory phase imaging. 3 mm subpleural lingular nodule likely a subpleural lymph node. Lungs are otherwise clear. No pleural fluid. Airway is unremarkable. Upper Abdomen: Reflux of contrast into the IVC and hepatic veins. Visualized portions of the liver, gallbladder and adrenal glands are unremarkable. Partially imaged area of presumed fat necrosis along the interpolar right kidney, likely smaller than on 05/29/2011. Focal area of hyperdensity along the upper pole the right kidney and subcentimeter low-attenuation lesion along the medial left kidney are too small to characterize. Visualized portions of the spleen, pancreas, stomach and bowel are grossly unremarkable. Musculoskeletal: No worrisome lytic or sclerotic lesions. Mild anterior wedging of a lower thoracic vertebral body, likely remote. Review of the MIP images confirms the above findings. IMPRESSION: 1. Negative for pulmonary embolus. 2. Aortic atherosclerosis (ICD10-170.0). Severe coronary artery calcification. Cardiac enlargement. Electronically Signed   By: Lorin Picket M.D.   On: 10/19/2017 16:23     Medications:   . sodium chloride     . amiodarone  400 mg Oral BID  . atorvastatin  10 mg Oral QPM  . calcium acetate  1,334 mg Oral TID WC  . cholecalciferol  2,000 Units Oral Daily  . docusate sodium  100 mg Oral BID  . febuxostat  40  mg Oral QPM  . levalbuterol  1.25 mg Nebulization Once  . montelukast  10 mg Oral QPM  . multivitamin  1 tablet Oral Daily  . pantoprazole  40 mg Oral Daily  . sodium chloride flush  3 mL Intravenous Q12H  . umeclidinium-vilanterol  1 puff Inhalation Daily  . warfarin  3 mg Oral QPM  . Warfarin - Pharmacist Dosing Inpatient   Does not apply q1800   acetaminophen, albuterol, bisacodyl, guaiFENesin **AND** dextromethorphan, HYDROcodone-acetaminophen, ketorolac, ondansetron **OR** ondansetron (ZOFRAN) IV, sodium chloride flush  Assessment/ Plan:  76 y.o. 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  1. End-stage renal disease -Dialysis fistula is clotted -Consult placed with vascular for a temporary dialysis catheter  2.  Anemia of chronic kidney disease Hemoglobin 11.7 We will hold Procrit for now  3.  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  Prednisone 20 mg. Taper as per [facility] direction  4.  Secondary hyperparathyroidism Continue PhosLo  5.  Hypotension from atrial flutter Cardioverted to sinus earlier today     LOS: 2 [facility] 10/26/20184:09 PM  [place], [place]

## 2017-10-21 NOTE — Anesthesia Post-op Follow-up Note (Signed)
Anesthesia QCDR form completed.        

## 2017-10-21 NOTE — Care Management (Signed)
successful cardioversion this morning

## 2017-10-21 NOTE — Anesthesia Postprocedure Evaluation (Signed)
Anesthesia Post Note  Patient: Maurice Peterson  Procedure(s) Performed: CARDIOVERSION (N/A )  Patient location during evaluation: PACU Anesthesia Type: General Level of consciousness: awake and alert and oriented Pain management: pain level controlled Vital Signs Assessment: post-procedure vital signs reviewed and stable Respiratory status: spontaneous breathing Cardiovascular status: blood pressure returned to baseline Anesthetic complications: no Comments: Talked with Dr. Fletcher Anon about Bps, WBC and K+ preop.  BPs in 80 s preop and he is fine with that level postop.  Patient alert and cognizant post op     Last Vitals:  Vitals:   10/21/17 0808 10/21/17 0816  BP: (!) 82/63 90/74  Pulse: 78 77  Resp: (!) 21 (!) 22  Temp:    SpO2: 97% 100%    Last Pain:  Vitals:   10/21/17 0414  TempSrc: Oral  PainSc:                  [name]

## 2017-10-21 NOTE — Transfer of Care (Signed)
Immediate Anesthesia Transfer of Care Note  Patient: MAYUR DUMAN  Procedure(s) Performed: CARDIOVERSION (N/A )  Patient Location: PACU  Anesthesia Type:General  Level of Consciousness: awake, alert  and oriented  Airway & Oxygen Therapy: Patient Spontanous Breathing and Patient connected to nasal cannula oxygen  Post-op Assessment: Report given to RN and Post -op Vital signs reviewed and stable  Post vital signs: Reviewed and stable  Last Vitals:  Vitals:   10/21/17 0807 10/21/17 0808  BP:  (!) 82/63  Pulse: 74 78  Resp: (!) 22 (!) 21  Temp:    SpO2: 94% 97%    Last Pain:  Vitals:   10/21/17 0414  TempSrc: Oral  PainSc:          Complications: No apparent anesthesia complications

## 2017-10-21 NOTE — Anesthesia Preprocedure Evaluation (Addendum)
Anesthesia Evaluation  Patient identified by MRN, date of birth, ID band Patient awake    Reviewed: Allergy & Precautions, NPO status , Patient's Chart, lab work & pertinent test results, reviewed documented beta blocker date and time   Airway Mallampati: III  TM Distance: >3 FB     Dental  (+) Chipped   Pulmonary shortness of breath and with exertion, COPD, former smoker,    Pulmonary exam normal        Cardiovascular hypertension, Pt. on medications and Pt. on home beta blockers + CAD and + Cardiac Stents   Rhythm:Irregular     Neuro/Psych negative psych ROS   GI/Hepatic GERD  Controlled,  Endo/Other    Renal/GU ESRFRenal disease     Musculoskeletal  (+) Arthritis ,   Abdominal   Peds  Hematology   Anesthesia Other Findings Gout. EKG shows AF and Rbbb. Past Medical History: No date: Arthritis No date: Atrial flutter ([place])     Comment:  a. s/p DCCV 07/2017 briefly successful; b. amio and               warfarin; c. CHADS2VASc => 5 (CHF, HTN, age x 2, vascular              disease); No date: CAD (coronary artery disease)     Comment:  a. remote inferior MI s/p stenting; b. Myoview in               10/2016 with normal perfusion with an EF of 46% No date: COPD (chronic obstructive pulmonary disease) (HCC) No date: ESRD (end stage renal disease) on dialysis ([place])     Comment:  a. HD MWF No date: GERD (gastroesophageal reflux disease) No date: Hyperlipidemia No date: Hypertension No date: Ischemic cardiomyopathy     Comment:  a. TTE 05/2017: EF 35-40%, unable to exlcude RWMA, mild               MR, mildly dilated RV No date: Prostate cancer ([place]) No date: Shortness of breath dyspnea  Reproductive/Obstetrics                             Anesthesia Physical  Anesthesia Plan  ASA: III  Anesthesia Plan: General   Post-op Pain Management:    Induction: Intravenous  PONV Risk Score  and Plan:   Airway Management Planned: Nasal Cannula  Additional Equipment:   Intra-op Plan:   Post-operative Plan:   Informed Consent: I have reviewed the patients History and Physical, chart, labs and discussed the procedure including the risks, benefits and alternatives for the proposed anesthesia with the patient or authorized representative who has indicated his/her understanding and acceptance.     Plan Discussed with: CRNA  Anesthesia Plan Comments:         Anesthesia Quick Evaluation

## 2017-10-21 NOTE — Discharge Summary (Addendum)
[place] [facility] [place] NAME: Maurice Peterson    MR#:  [id number]  DATE OF BIRTH:  05-28-1941  DATE OF ADMISSION:  10/19/2017 ADMITTING PHYSICIAN: Max Sane, MD  DATE OF DISCHARGE: 10/21/2017  PRIMARY CARE PHYSICIAN: Madelyn Brunner, MD    ADMISSION DIAGNOSIS:  Pleuritic chest pain [R07.81] COPD exacerbation ([place]) [J44.1] Sepsis, due to unspecified organism [facility]) [A41.9]  DISCHARGE DIAGNOSIS:  Atrial fibrillation with RVR status post successful cardioversion to sinus rhythm 10/21/2017 Coronary artery disease History of ischemic cardia myopathy End-stage renal disease on hemodialysis SECONDARY DIAGNOSIS:   Past Medical History:  Diagnosis Date  . Arthritis   . Atrial flutter ([place])    a. s/p DCCV 07/2017 briefly successful; b. amio and warfarin; c. CHADS2VASc => 5 (CHF, HTN, age x 2, vascular disease);  [name] CAD (coronary artery disease)    a. remote inferior MI s/p stenting; b. Myoview in 10/2016 with normal perfusion with an EF of 46%  . COPD (chronic obstructive pulmonary disease) ([place])   . ESRD (end stage renal disease) on dialysis ([place])    a. HD MWF  . GERD (gastroesophageal reflux disease)   . Hyperlipidemia   . Hypertension   . Ischemic cardiomyopathy    a. TTE 05/2017: EF 35-40%, unable to exlcude RWMA, mild MR, mildly dilated RV  . Prostate cancer ([place])   . Shortness of breath dyspnea     HOSPITAL COURSE:   75 y m with known history ofESRD on HD (MWF), atrial flutter on Coumadin, COPD, hypertension and hyperlipidemia being admitted for possible sepsis  * Atrial fibrillation/flutter -Patient is status post cardioversion now in sinus rhythm - resume amiodarone at400 twice a day 2 weeks and then 200 twice daily- on coumadin with therapeutic INR 2.57 - patient will get INR checked at Dr. Donivan Scull office   * Ischemic cardiomyopathy/Congestive heart failure-chronic-systolic class III - well compensated at this  time  * End-stage renal disease on hemodialysis - nephrology input noted  * Vasculitis on prednisone -Ig A vascilitis/nephropathy - will hold off steroids for now with concern for sepsis. - consider Rheum c/s if need  Discussed with patient and wife.  He will discharge home after dialysis.  Above was discussed with Dr.Arida  CONSULTS OBTAINED:  Treatment Team:  Murlean Iba, MD Minna Merritts, MD  DRUG ALLERGIES:   Allergies  Allergen Reactions  . Ace Inhibitors Nausea And Vomiting  . Amoxicillin-Pot Clavulanate Nausea And Vomiting       . Other Nausea And Vomiting    Anti-inflammatories    DISCHARGE MEDICATIONS:   Current Discharge Medication List    CONTINUE these medications which have CHANGED   Details  amiodarone (PACERONE) 200 MG tablet Take 400 mg (2 tab) by mouth twice a day for 1 week  then taken 200 mg (1 tab) by mouth  Twice a day Qty: 90 tablet, Refills: 0      CONTINUE these medications which have NOT CHANGED   Details  atorvastatin (LIPITOR) 10 MG tablet Take 10 mg by mouth every evening.     calcium acetate (PHOSLO) 667 MG capsule Take 1,334 mg by mouth 3 (three) times daily with meals.    carvedilol (COREG) 6.25 MG tablet Take 1 tablet (6.25 mg total) by mouth 2 (two) times daily with a meal. Qty: 60 tablet, Refills: 1    Cholecalciferol (VITAMIN D3) 2000 units TABS Take 2,000 Units by mouth daily.     febuxostat (ULORIC)  40 MG tablet Take 40 mg by mouth every evening.     furosemide (LASIX) 80 MG tablet Take 80 mg by mouth daily.    montelukast (SINGULAIR) 10 MG tablet Take 10 mg by mouth every evening.     multivitamin (RENA-VIT) TABS tablet Take 1 tablet by mouth daily.    pantoprazole (PROTONIX) 40 MG tablet Take 40 mg by mouth daily.    predniSONE (DELTASONE) 10 MG tablet Take 30 mg x 1 week, take 20 mg x 1 week, take 10 mg x 1 week, then off    umeclidinium-vilanterol (ANORO ELLIPTA) 62.5-25 MCG/INH AEPB Inhale 1 puff into  the lungs daily.    warfarin (COUMADIN) 3 MG tablet Take 3 mg by mouth every evening.     acetaminophen (TYLENOL) 500 MG tablet Take 1,000 mg by mouth daily as needed for moderate pain or headache.    albuterol (PROVENTIL HFA;VENTOLIN HFA) 108 (90 BASE) MCG/ACT inhaler Inhale 2 puffs into the lungs every 4 (four) hours as needed for wheezing or shortness of breath.    azelastine (OPTIVAR) 0.05 % ophthalmic solution Place 1 drop into both eyes 2 (two) times daily as needed (allergies).    dextromethorphan-guaiFENesin (MUCINEX DM) 30-600 MG 12hr tablet Take 1 tablet by mouth at bedtime as needed for cough.    DiphenhydrAMINE HCl, Sleep, (ZZZQUIL PO) Take 25 mg by mouth at bedtime as needed (sleep).         If you experience worsening of your admission symptoms, develop shortness of breath, life threatening emergency, suicidal or homicidal thoughts you must seek medical attention immediately by calling 911 or calling your MD immediately  if symptoms less severe.  You Must read complete instructions/literature along with all the possible adverse reactions/side effects for all the Medicines you take and that have been prescribed to you. Take any new Medicines after you have completely understood and accept all the possible adverse reactions/side effects.   Please note  You were cared for by a hospitalist during your hospital stay. If you have any questions about your discharge medications or the care you received while you were in the hospital after you are discharged, you can call the unit and asked to speak with the hospitalist on call if the hospitalist that took care of you is not available. Once you are discharged, your primary care physician will handle any further medical issues. Please note that NO REFILLS for any discharge medications will be authorized once you are discharged, as it is imperative that you return to your primary care physician (or establish a relationship with a primary  care physician if you do not have one) for your aftercare needs so that they can reassess your need for medications and monitor your lab values. Today   SUBJECTIVE   Doing well  VITAL SIGNS:  Blood pressure 96/62, pulse 79, temperature 97.6 F (36.4 C), temperature source Oral, resp. rate 18, height 5\' 9"  (1.753 m), weight 115.8 kg (255 lb 6.4 oz), SpO2 94 %.  I/O:    Intake/Output Summary (Last 24 hours) at 10/22/17 0650 Last data filed at 10/22/17 0000  Gross per 24 hour  Intake              640 ml  Output                0 ml  Net              640 ml    PHYSICAL EXAMINATION:  GENERAL:  76 y.o.-year-old patient lying in the bed with no acute distress.  EYES: Pupils equal, round, reactive to light and accommodation. No scleral icterus. Extraocular muscles intact.  HEENT: Head atraumatic, normocephalic. Oropharynx and nasopharynx clear.  NECK:  Supple, no jugular venous distention. No thyroid enlargement, no tenderness.  LUNGS decreased breath sounds bilaterally, no wheezing, rales,rhonchi or crepitation. No use of accessory muscles of respiration.  CARDIOVASCULAR: S1, S2 normal. No murmurs, rubs, or gallops.  ABDOMEN: Soft, non-tender, non-distended. Bowel sounds present. No organomegaly or mass.  EXTREMITIES: No pedal edema, cyanosis, or clubbing.  NEUROLOGIC: Cranial nerves II through XII are intact. Muscle strength 5/5 in all extremities. Sensation intact. Gait not checked.  PSYCHIATRIC: The patient is alert and oriented x 3.  SKIN: No obvious rash, lesion, or ulcer.   DATA REVIEW:   CBC   Recent Labs Lab 10/21/17 0344  WBC 21.2*  HGB 11.7*  HCT 37.3*  PLT 126*    Chemistries   Recent Labs Lab 10/19/17 1446  10/21/17 0344  NA 136  < > 131*  K 4.1  < > 5.7*  CL 95*  < > 92*  CO2 27  < > 25  GLUCOSE 108*  < > 102*  BUN 35*  < > 91*  CREATININE 4.09*  < > 7.31*  CALCIUM 8.0*  < > 8.1*  AST 16  --   --   ALT 24  --   --   ALKPHOS 45  --   --   BILITOT  1.1  --   --   < > = values in this interval not displayed.  Microbiology Results   Recent Results (from the past 240 hour(s))  Blood culture (routine x 2)     Status: None (Preliminary result)   Collection Time: 10/19/17  4:30 PM  Result Value Ref Range Status   Specimen Description BLOOD RIGHT ARM  Final   Special Requests   Final    BOTTLES DRAWN AEROBIC AND ANAEROBIC Blood Culture adequate volume   Culture NO GROWTH 2 DAYS  Final   Report Status PENDING  Incomplete  Blood culture (routine x 2)     Status: None (Preliminary result)   Collection Time: 10/19/17  7:37 PM  Result Value Ref Range Status   Specimen Description BLOOD RIGHT SHOULDER  Final   Special Requests   Final    BOTTLES DRAWN AEROBIC AND ANAEROBIC Blood Culture adequate volume   Culture NO GROWTH 2 DAYS  Final   Report Status PENDING  Incomplete  MRSA PCR Screening     Status: None   Collection Time: 10/19/17  7:38 PM  Result Value Ref Range Status   MRSA by PCR NEGATIVE NEGATIVE Final    Comment:        The GeneXpert MRSA Assay (FDA approved for NASAL specimens only), is one component of a comprehensive MRSA colonization surveillance program. It is not intended to diagnose MRSA infection nor to guide or monitor treatment for MRSA infections.     RADIOLOGY:  No results found.   Management plans discussed with the patient, family and they are in agreement.  CODE STATUS:     Code Status Orders        Start     Ordered   10/19/17 2000  Full code  Continuous     10/19/17 1959    Code Status History    Date Active Date Inactive Code Status Order ID Comments User Context   06/20/2017  4:48  PM 06/22/2017  7:26 PM Full Code [id number]  Bettey Costa, MD Inpatient   06/17/2017  6:50 PM 06/19/2017  2:32 PM Full Code [id number]  Henreitta Leber, MD Inpatient   08/05/2015  3:25 PM 08/05/2015  6:45 PM Full Code [id number]  Delana Meyer Dolores Lory, MD Inpatient   05/20/2015  4:18 PM 05/20/2015  7:37 PM Full Code  [id number]  Schnier, Dolores Lory, MD Inpatient    Advance Directive Documentation     Most Recent Value  Type of Advance Directive  Healthcare Power of Attorney  Pre-existing out of facility DNR order (yellow form or pink MOST form)  -  "MOST" Form in Place?  -      TOTAL TIME TAKING CARE OF THIS PATIENT: 40 minutes.    Zelene Barga M.D on 10/22/2017 at 6:50 AM  Between 7am to 6pm - Pager - [phone] After 6pm go to [email or web address] - password [email or web address] [place]  Office  [phone]  CC: Primary care physician; Madelyn Brunner, MD

## 2017-10-21 NOTE — Plan of Care (Signed)
Problem: Activity: Goal: Risk for activity intolerance will decrease Outcome: Not Progressing Patient DOE, PRN breathing treatment given, O2 sats 99%. Will continue to monitor.

## 2017-10-21 NOTE — Progress Notes (Signed)
ANTICOAGULATION CONSULT NOTE - Consult  Pharmacy Consult for warfarin Indication: atrial fibrillation  Allergies  Allergen Reactions  . Ace Inhibitors Nausea And Vomiting  . Amoxicillin-Pot Clavulanate Nausea And Vomiting       . Other Nausea And Vomiting    Anti-inflammatories    Patient Measurements: Height: 5\' 9"  (175.3 cm) Weight: 253 lb 4.8 oz (114.9 kg) IBW/kg (Calculated) : 70.7 Heparin Dosing Weight: 113.6 kg  Vital Signs: Temp: 98.7 F (37.1 C) (10/26 0709) Temp Source: Oral (10/26 0414) BP: 92/72 (10/26 0842) Pulse Rate: 83 (10/26 0842)  Labs:  Recent Labs  10/19/17 1446 10/19/17 1713  10/20/17 0206 10/20/17 0758 10/20/17 1427 10/21/17 0344 10/21/17 0440  HGB 12.4*  --   --  12.1*  --   --  11.7*  --   HCT 39.1*  --   --  37.7*  --   --  37.3*  --   PLT 186  --   --  113*  --   --  126*  --   APTT  --  37*  --   --   --   --   --   --   LABPROT  --  27.4*  --  24.6*  --   --   --  24.0*  INR  --  2.57  --  2.24  --   --   --  2.17  CREATININE 4.09*  --   --  5.59*  --   --  7.31*  --   TROPONINI 0.05*  --   < > 0.05* 0.05* 0.07*  --   --   < > = values in this interval not displayed.  Estimated Creatinine Clearance: 10.9 mL/min (A) (by C-G formula based on SCr of 7.31 mg/dL (H)).   Medical History: Past Medical History:  Diagnosis Date  . Arthritis   . Atrial flutter ([place])    a. s/p DCCV 07/2017 briefly successful; b. amio and warfarin; c. CHADS2VASc => 5 (CHF, HTN, age x 2, vascular disease);  [name] CAD (coronary artery disease)    a. remote inferior MI s/p stenting; b. Myoview in 10/2016 with normal perfusion with an EF of 46%  . COPD (chronic obstructive pulmonary disease) ([place])   . ESRD (end stage renal disease) on dialysis ([place])    a. HD MWF  . GERD (gastroesophageal reflux disease)   . Hyperlipidemia   . Hypertension   . Ischemic cardiomyopathy    a. TTE 05/2017: EF 35-40%, unable to exlcude RWMA, mild MR, mildly dilated RV  . Prostate  cancer ([place])   . Shortness of breath dyspnea     Medications:  Scheduled:  . amiodarone  400 mg Oral BID  . atorvastatin  10 mg Oral QPM  . calcium acetate  1,334 mg Oral TID WC  . cholecalciferol  2,000 Units Oral Daily  . docusate sodium  100 mg Oral BID  . febuxostat  40 mg Oral QPM  . levalbuterol  1.25 mg Nebulization Once  . montelukast  10 mg Oral QPM  . multivitamin  1 tablet Oral Daily  . pantoprazole  40 mg Oral Daily  . sodium chloride flush  3 mL Intravenous Q12H  . sodium polystyrene  15 g Oral Once  . umeclidinium-vilanterol  1 puff Inhalation Daily  . warfarin  3 mg Oral QPM  . Warfarin - Pharmacist Dosing Inpatient   Does not apply q1800    Assessment: Patient admitted for CP/SOB was found to  be septic. Is anticoagulated w/ warfarin for chronic afib. Warfarin PTA: 3 mg daily at 6 pm.   INR  Dose  10/23   3.2 10/24   2.57 --- 10/25 2.24 3mg  10/26 2.17   INR is currently in therapeutic range.  Goal of Therapy:  INR 2-3 Monitor platelets by anticoagulation protocol: Yes   Plan:  INR is in therapeutic range, although trending down, possibly due to no dose on 10/24; Will continue patient's warfarin home regimen. Will continue warfarin 3 mg daily @ 6 pm. Will monitor CBC/INRs daily while on warfarin.  [place] Resident  10/21/2017

## 2017-10-22 DIAGNOSIS — N186 End stage renal disease: Secondary | ICD-10-CM

## 2017-10-22 DIAGNOSIS — I4891 Unspecified atrial fibrillation: Secondary | ICD-10-CM

## 2017-10-22 DIAGNOSIS — T82868A Thrombosis of vascular prosthetic devices, implants and grafts, initial encounter: Secondary | ICD-10-CM

## 2017-10-22 DIAGNOSIS — I484 Atypical atrial flutter: Secondary | ICD-10-CM

## 2017-10-22 LAB — PHOSPHORUS: PHOSPHORUS: 11.1 mg/dL — AB (ref 2.5–4.6)

## 2017-10-22 LAB — PROTIME-INR
INR: 2.62
Prothrombin Time: 27.8 seconds — ABNORMAL HIGH (ref 11.4–15.2)

## 2017-10-22 LAB — GLUCOSE, CAPILLARY: Glucose-Capillary: 92 mg/dL (ref 65–99)

## 2017-10-22 MED ORDER — LORAZEPAM 2 MG/ML IJ SOLN
1.0000 mg | Freq: Once | INTRAMUSCULAR | Status: AC
  Administered 2017-10-22: 1 mg via INTRAVENOUS
  Filled 2017-10-22: qty 1

## 2017-10-22 MED ORDER — WARFARIN SODIUM 3 MG PO TABS: 3.0000 mg | ORAL_TABLET | Freq: Every evening | ORAL | Status: DC

## 2017-10-22 MED ORDER — WARFARIN 0.5 MG HALF TABLET: 1.5000 mg | ORAL_TABLET | Freq: Once | ORAL | Status: DC

## 2017-10-22 NOTE — Progress Notes (Signed)
[facility] NAME: Maurice Peterson    MR#:  [id number]  DATE OF BIRTH:  June 11, 1941  SUBJECTIVE:  Doing well. HD access clotted Pt to get temp catheter today  REVIEW OF SYSTEMS:   Review of Systems  Constitutional: Negative for chills, fever and weight loss.  HENT: Negative for ear discharge, ear pain and nosebleeds.   Eyes: Negative for blurred vision, pain and discharge.  Respiratory: Negative for sputum production, shortness of breath, wheezing and stridor.   Cardiovascular: Negative for chest pain, palpitations, orthopnea and PND.  Gastrointestinal: Negative for abdominal pain, diarrhea, nausea and vomiting.  Genitourinary: Negative for frequency and urgency.  Musculoskeletal: Negative for back pain and joint pain.  Skin: Positive for rash.  Neurological: Positive for weakness. Negative for sensory change, speech change and focal weakness.  Psychiatric/Behavioral: Negative for depression and hallucinations. The patient is not nervous/anxious.    Tolerating Diet:yes Tolerating PT: not needed  DRUG ALLERGIES:   Allergies  Allergen Reactions  . Ace Inhibitors Nausea And Vomiting  . Amoxicillin-Pot Clavulanate Nausea And Vomiting       . Other Nausea And Vomiting    Anti-inflammatories    VITALS:  Blood pressure 122/82, pulse 89, temperature 97.8 F (36.6 C), temperature source Oral, resp. rate 18, height 5\' 9"  (1.753 m), weight 115.8 kg (255 lb 6.4 oz), SpO2 99 %.  PHYSICAL EXAMINATION:   Physical Exam  GENERAL:  76 y.o.-year-old patient lying in the bed with no acute distress.  EYES: Pupils equal, round, reactive to light and accommodation. No scleral icterus. Extraocular muscles intact.  HEENT: Head atraumatic, normocephalic. Oropharynx and nasopharynx clear.  NECK:  Supple, no jugular venous distention. No thyroid enlargement, no tenderness.  LUNGS: Normal breath sounds bilaterally, no wheezing, rales, rhonchi.  No use of accessory muscles of respiration.  CARDIOVASCULAR: S1, S2 normal. No murmurs, rubs, or gallops.  ABDOMEN: Soft, nontender, nondistended. Bowel sounds present. No organomegaly or mass.  EXTREMITIES: No cyanosis, clubbing or edema b/l.    NEUROLOGIC: Cranial nerves II through XII are intact. No focal Motor or sensory deficits b/l.   PSYCHIATRIC:  patient is alert and oriented x 3.  SKIN: vasculitic rash+ on Hands   LABORATORY PANEL:  CBC  Recent Labs Lab 10/21/17 0344  WBC 21.2*  HGB 11.7*  HCT 37.3*  PLT 126*    Chemistries   Recent Labs Lab 10/19/17 1446  10/21/17 0344  NA 136  < > 131*  K 4.1  < > 5.7*  CL 95*  < > 92*  CO2 27  < > 25  GLUCOSE 108*  < > 102*  BUN 35*  < > 91*  CREATININE 4.09*  < > 7.31*  CALCIUM 8.0*  < > 8.1*  AST 16  --   --   ALT 24  --   --   ALKPHOS 45  --   --   BILITOT 1.1  --   --   < > = values in this interval not displayed. Cardiac Enzymes  Recent Labs Lab 10/20/17 1427  TROPONINI 0.07*   RADIOLOGY:  No results found. ASSESSMENT AND PLAN:  29 y m with known history ofESRD on HD (MWF), atrial flutter on Coumadin, COPD, hypertension and hyperlipidemia being admitted for possible sepsis  * Atrial fibrillation/flutter -Patient is status post cardioversion now in sinus rhythm - resume amiodarone at400 twice a day 2 weeks and then 200 twice daily - on coumadin with  therapeutic INR 2.57--COUMDAIN ON HOLD form today due to declotting procedure for Monday - patient will get INR checked at Dr. 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 D/w dr Candiss Norse and dr schneir  Case discussed with Care Management/Social Worker. Management plans discussed with the patient, family and they are in agreement.  CODE STATUS: *full code DVT Prophylaxis: hcoumadin  TOTAL TIME TAKING CARE OF THIS PATIENT: *25* minutes.  >50% time spent on counselling and coordination of care  POSSIBLE D/C IN *1-2* DAYS, DEPENDING ON CLINICAL CONDITION.  Note: This dictation was prepared with Dragon dictation along with smaller phrase technology. Any transcriptional errors that result from this process are unintentional.  Nelva Hauk M.D on 10/22/2017 at 12:32 PM  Between 7am to 6pm - Pager - [phone]  After 6pm go to [email or web address] - password [email or web address] [facility]  Office  [phone]  CC: Primary care physician; Madelyn Brunner, MD

## 2017-10-22 NOTE — Progress Notes (Signed)
Progress Note  Patient Name: Maurice Peterson Date of Encounter: 10/22/2017  Primary Cardiologist: Dr. Caryl Comes (EP)  Subjective   Feels well this morning, Recent successful cardioversion, maintaining normal sinus rhythm Telemetry reviewed, heart rates in the 80s AV graft thrombus, unable to have dialysis, waiting for temporary hemodialysis line placement  Inpatient Medications    Scheduled Meds: . amiodarone  400 mg Oral BID  . atorvastatin  10 mg Oral QPM  . calcium acetate  1,334 mg Oral TID WC  . cholecalciferol  2,000 Units Oral Daily  . docusate sodium  100 mg Oral BID  . febuxostat  40 mg Oral QPM  . levalbuterol  1.25 mg Nebulization Once  . montelukast  10 mg Oral QPM  . multivitamin  1 tablet Oral Daily  . nystatin  5 mL Oral QID  . pantoprazole  40 mg Oral Daily  . predniSONE  20 mg Oral Q breakfast  . sodium chloride flush  3 mL Intravenous Q12H  . umeclidinium-vilanterol  1 puff Inhalation Daily  . Warfarin - Pharmacist Dosing Inpatient   Does not apply q1800   Continuous Infusions: . sodium chloride     PRN Meds: acetaminophen, albuterol, bisacodyl, guaiFENesin **AND** dextromethorphan, HYDROcodone-acetaminophen, ketorolac, ondansetron **OR** ondansetron (ZOFRAN) IV, sodium chloride flush   Vital Signs    Vitals:   10/22/17 0753 10/22/17 1345 10/22/17 1350 10/22/17 1405  BP: 122/82 103/81 96/64 102/81  Pulse: 89 88 85 86  Resp: 18 (!) 23 18 (!) 21  Temp: 97.8 F (36.6 C) 98.4 F (36.9 C)    TempSrc: Oral Oral    SpO2: 99% 96% 98% 96%  Weight:  259 lb 4.2 oz (117.6 kg)    Height:        Intake/Output Summary (Last 24 hours) at 10/22/17 1420 Last data filed at 10/22/17 0000  Gross per 24 hour  Intake                0 ml  Output                0 ml  Net                0 ml   Filed Weights   10/21/17 0414 10/22/17 0439 10/22/17 1345  Weight: 253 lb 4.8 oz (114.9 kg) 255 lb 6.4 oz (115.8 kg) 259 lb 4.2 oz (117.6 kg)    Telemetry    Currently  normal sinus rhythm.- Personally Reviewed  ECG    Atrial flutter converted to sinus rhythm with cardioversion.- Personally Reviewed  Physical Exam   GEN: No acute distress, obese Neck: No JVD Cardiac: RRR, no rubs, or gallops.  1 out of 6 systolic ejection murmur at the base. Respiratory: Clear to auscultation bilaterally. GI: Soft, nontender, non-distended  MS: No edema; No deformity. Neuro:  Nonfocal  Psych: Normal affect   Labs    Chemistry  Recent Labs Lab 10/18/17 1030 10/19/17 1446 10/20/17 0206 10/21/17 0344  NA 141 136 136 131*  K 5.2 4.1 5.2* 5.7*  CL 94* 95* 93* 92*  CO2 25 27 27 25   GLUCOSE 97 108* 151* 102*  BUN 57* 35* 49* 91*  CREATININE 5.80* 4.09* 5.59* 7.31*  CALCIUM 8.2* 8.0* 7.9* 8.1*  PROT 6.5 6.9  --   --   ALBUMIN 4.2 3.7  --   --   AST 9 16  --   --   ALT 22 24  --   --  ALKPHOS 65 45  --   --   BILITOT 0.5 1.1  --   --   GFRNONAA 9* 13* 9* 6*  GFRAA 10* 15* 10* 7*  ANIONGAP  --  14 16* 14     Hematology  Recent Labs Lab 10/19/17 1446 10/20/17 0206 10/21/17 0344  WBC 20.4* 19.7* 21.2*  RBC 4.17* 3.99* 3.95*  HGB 12.4* 12.1* 11.7*  HCT 39.1* 37.7* 37.3*  MCV 93.9 94.5 94.5  MCH 29.9 30.2 29.6  MCHC 31.8* 32.0 31.4*  RDW 17.7* 17.3* 17.8*  PLT 186 113* 126*    Cardiac Enzymes  Recent Labs Lab 10/19/17 1937 10/20/17 0206 10/20/17 0758 10/20/17 1427  TROPONINI 0.05* 0.05* 0.05* 0.07*   No results for input(s): TROPIPOC in the last 168 hours.   BNPNo results for input(s): BNP, PROBNP in the last 168 hours.   DDimer No results for input(s): DDIMER in the last 168 hours.   Radiology    No results found.  Cardiac Studies     Patient Profile     76 y.o. male with history of coronary artery disease status post remote inferior MI status post stenting, chronic systolic heart failure due to ischemic cardiomyopathy, paroxysmal atrial flutter on anticoagulation with warfarin and end-stage renal disease on hemodialysis  who presented with atrial flutter with rapid ventricular response with associated hypotension.  Assessment & Plan    1.  Atrial flutter with RVR:  Status post successful cardioversion to sinus rhythm this hospital admission --continue amiodarone 400 mg twice daily for another week followed by 200 mg twice daily.   Continue anticoagulation with warfarin with a target INR between 2 and 3.   INR should be checked shortly after discharge due to amiodarone interaction with warfarin.  2.  Coronary artery disease:  Currently with no symptoms of angina. No further workup at this time. Continue current medication regimen.  3.  Hypotension:   in the setting of atrial flutter.  Blood pressure has remained low despite normal sinus rhythm Asymptomatic  4.  End-stage renal disease on hemodialysis AV graft occluded, waiting for temporary line to be placed   Total encounter time more than 25 minutes  Greater than 50% was spent in counseling and coordination of care with the patient   For questions or updates, please contact [place] Please consult [email or web address] for contact info under Cardiology/STEMI.      Signed, Ida Rogue, MD  10/22/2017, 2:20 PM

## 2017-10-22 NOTE — Progress Notes (Signed)
[facility], [name] 10/22/17  Subjective:     HEMODIALYSIS FLOWSHEET:  Blood Flow Rate (mL/min): 350 mL/min Arterial Pressure (mmHg): -160 mmHg Venous Pressure (mmHg): 150 mmHg Transmembrane Pressure (mmHg): 60 mmHg Ultrafiltration Rate (mL/min): 660 mL/min Dialysate Flow Rate (mL/min): 600 ml/min Conductivity: Machine : 13.7 Conductivity: Machine : 13.7 Dialysis Fluid Bolus: Normal Saline Bolus Amount (mL): 250 mL (prime) Dialysate Change: 2K  Tolerating dialysis well Temporary dialysis catheter placed today   Objective:  Vital signs in last 24 hours:  Temp:  [97.6 F (36.4 C)-98.4 F (36.9 C)] 98.4 F (36.9 C) (10/27 1345) Pulse Rate:  [79-97] 97 (10/27 1605) Resp:  [18-25] 22 (10/27 1605) BP: (86-122)/(62-82) 94/70 (10/27 1605) SpO2:  [94 %-99 %] 98 % (10/27 1605) Weight:  [115.8 kg (255 lb 6.4 oz)-117.6 kg (259 lb 4.2 oz)] 117.6 kg (259 lb 4.2 oz) (10/27 1345)  Weight change: 0.953 kg (2 lb 1.6 oz) Filed Weights   10/21/17 0414 10/22/17 0439 10/22/17 1345  Weight: 114.9 kg (253 lb 4.8 oz) 115.8 kg (255 lb 6.4 oz) 117.6 kg (259 lb 4.2 oz)    Intake/Output:    Intake/Output Summary (Last 24 hours) at 10/22/17 1637 Last data filed at 10/22/17 0000  Gross per 24 hour  Intake                0 ml  Output                0 ml  Net                0 ml     Physical Exam: General: NAD, laying in the bed  HEENT Anicteric, moist mucus membranes  Neck Supple, no JVD  Pulm/lungs Mild diffuse wheezing and crackles  CVS/Heart Regular, tachycardic, a flutter  Abdomen:  Soft, NT  Extremities: + dependent edema  Neurologic: Alert, oriented  Skin: Petechial rash over legs, thighs, torso  Access: Left forearm AVF, clotted.  No bruit, temporary dialysis catheter       Basic Metabolic Panel:   Recent Labs Lab 10/18/17 1030 10/19/17 1446 10/20/17 0206 10/21/17 0344  NA 141 136 136 131*  K 5.2 4.1 5.2* 5.7*  CL 94* 95* 93* 92*   CO2 25 27 27 25   GLUCOSE 97 108* 151* 102*  BUN 57* 35* 49* 91*  CREATININE 5.80* 4.09* 5.59* 7.31*  CALCIUM 8.2* 8.0* 7.9* 8.1*  PHOS  --   --   --  11.1*     CBC:  Recent Labs Lab 10/18/17 1030 10/19/17 1446 10/20/17 0206 10/21/17 0344  WBC 22.3* 20.4* 19.7* 21.2*  NEUTROABS 19.6* 18.2*  --  18.6*  HGB 12.0* 12.4* 12.1* 11.7*  HCT 37.2* 39.1* 37.7* 37.3*  MCV 93 93.9 94.5 94.5  PLT 159 186 113* 126*     No results found for: HEPBSAG, HEPBSAB, HEPBIGM    Microbiology:  Recent Results (from the past 240 hour(s))  Blood culture (routine x 2)     Status: None (Preliminary result)   Collection Time: 10/19/17  4:30 PM  Result Value Ref Range Status   Specimen Description BLOOD RIGHT ARM  Final   Special Requests   Final    BOTTLES DRAWN AEROBIC AND ANAEROBIC Blood Culture adequate volume   Culture NO GROWTH 3 DAYS  Final   Report Status PENDING  Incomplete  Blood culture (routine x 2)     Status: None (Preliminary result)   Collection Time: 10/19/17  7:37 PM  Result Value Ref Range Status   Specimen Description BLOOD RIGHT SHOULDER  Final   Special Requests   Final    BOTTLES DRAWN AEROBIC AND ANAEROBIC Blood Culture adequate volume   Culture NO GROWTH 3 DAYS  Final   Report Status PENDING  Incomplete  MRSA PCR Screening     Status: None   Collection Time: 10/19/17  7:38 PM  Result Value Ref Range Status   MRSA by PCR NEGATIVE NEGATIVE Final    Comment:        The GeneXpert MRSA Assay (FDA approved for NASAL specimens only), is one component of a comprehensive MRSA colonization surveillance program. It is not intended to diagnose MRSA infection nor to guide or monitor treatment for MRSA infections.     Coagulation Studies:  Recent Labs  10/19/17 1713 10/20/17 0206 10/21/17 0440 10/22/17 0430  LABPROT 27.4* 24.6* 24.0* 27.8*  INR 2.57 2.24 2.17 2.62    Urinalysis: No results for input(s): COLORURINE, LABSPEC, PHURINE, GLUCOSEU, HGBUR,  BILIRUBINUR, KETONESUR, PROTEINUR, UROBILINOGEN, NITRITE, LEUKOCYTESUR in the last 72 hours.  Invalid input(s): APPERANCEUR    Imaging: No results found.   Medications:   . sodium chloride     . amiodarone  400 mg Oral BID  . atorvastatin  10 mg Oral QPM  . calcium acetate  1,334 mg Oral TID WC  . cholecalciferol  2,000 Units Oral Daily  . docusate sodium  100 mg Oral BID  . febuxostat  40 mg Oral QPM  . levalbuterol  1.25 mg Nebulization Once  . montelukast  10 mg Oral QPM  . multivitamin  1 tablet Oral Daily  . nystatin  5 mL Oral QID  . pantoprazole  40 mg Oral Daily  . predniSONE  20 mg Oral Q breakfast  . sodium chloride flush  3 mL Intravenous Q12H  . umeclidinium-vilanterol  1 puff Inhalation Daily  . Warfarin - Pharmacist Dosing Inpatient   Does not apply q1800   acetaminophen, albuterol, bisacodyl, guaiFENesin **AND** dextromethorphan, HYDROcodone-acetaminophen, ketorolac, ondansetron **OR** ondansetron (ZOFRAN) IV, sodium chloride flush  Assessment/ Plan:  76 y.o. 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  1. End-stage renal disease -Dialysis fistula is clotted -Temporary dialysis catheter placed today Repeat dialysis tomorrow morning to make up for coming Monday Patient is on schedule for fistula declot or if PermCath placement on Monday by vascular surgery  2.  Anemia of chronic kidney disease Hemoglobin 11.7 We will hold Procrit for now  3.  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  Prednisone 20 mg. Taper as per [facility] direction  4.  Secondary hyperparathyroidism Continue PhosLo  5.  Hypotension from atrial  flutter Cardioverted to sinus earlier today     LOS: 3 [facility] 10/27/20184:37 PM  [place], [place]

## 2017-10-22 NOTE — Op Note (Signed)
  OPERATIVE NOTE   PROCEDURE: 1. Insertion of temporary dialysis catheter catheter right femoral approach.  PRE-OPERATIVE DIAGNOSIS: Complication dialysis device with thrombosis of left wrist access; end-stage renal disease on hemodialysis  POST-OPERATIVE DIAGNOSIS: Same  SURGEON: Katha Cabal M.D.  ANESTHESIA: 1% lidocaine local infiltration  ESTIMATED BLOOD LOSS: Minimal cc  INDICATIONS:   Maurice Peterson is a 76 y.o. male who presents with thrombosis of his AV access and the need for dialysis.  He was therefore undergo temporary dialysis catheter placement his INR will be corrected and he will undergo restoration of appropriate upper extremity access.  DESCRIPTION: After obtaining full informed written consent, the patient was positioned supine. The right groin was prepped and draped in a sterile fashion. Ultrasound was placed in a sterile sleeve. Ultrasound was utilized to identify the right common femoral vein which is noted to be echolucent and compressible indicating patency. Images recorded for the permanent record. Under real-time visualization a Seldinger needle is inserted into the vein and the guidewires advanced without difficulty. Small counterincision was made at the wire insertion site. Dilator is passed over the wire and the temporary dialysis catheter catheter is fed over the wire without difficulty.  All lumens aspirate and flush easily and are packed with heparin saline. Catheter secured to the skin of the right thigh with 2-0 silk. A sterile dressing is applied with Biopatch.  COMPLICATIONS: None  CONDITION: Unchanged  Hortencia Pilar Office:  [phone] 10/22/2017, 2:22 PM

## 2017-10-22 NOTE — Progress Notes (Signed)
Post hd vitals 

## 2017-10-22 NOTE — Progress Notes (Signed)
Post hd assessment 

## 2017-10-22 NOTE — Consult Note (Signed)
[facility] Vascular Consult Note  MRN : [id number]  Maurice Peterson is a 76 y.o. (1941/07/01) male who presents with chief complaint of  Chief Complaint  Patient presents with  . Chest Pain  . Shortness of Breath  .  History of Present Illness: I was contacted by by phone by Dr. Candiss Norse to evaluate Maurice Peterson.  He was recently admitted to [facility] for cardioversion but during this hospitalization it was noted that his fistula no longer had a thrill or bruit.  Attempts at cannulation were unsuccessful the patient is been found to have a thrombosed left wrist AV access.  To this point the patient had not missed any dialysis runs.  He had no recent illnesses.  This fistula has had several problems in the past and there have been multiple interventions in order to keep it functioning.  Current Facility-Administered Medications  Medication Dose Route Frequency Provider Last Rate Last Dose  . 0.9 %  sodium chloride infusion  250 mL Intravenous Continuous Dunn, Ryan M, PA-C      . acetaminophen (TYLENOL) tablet 1,000 mg  1,000 mg Oral Daily PRN Fritzi Mandes, MD   1,000 mg at 10/21/17 2016  . albuterol (PROVENTIL) (2.5 MG/3ML) 0.083% nebulizer solution 2.5 mg  2.5 mg Inhalation Q4H PRN Max Sane, MD   2.5 mg at 10/22/17 0532  . amiodarone (PACERONE) tablet 400 mg  400 mg Oral BID Minna Merritts, MD   400 mg at 10/22/17 0600  . atorvastatin (LIPITOR) tablet 10 mg  10 mg Oral QPM Max Sane, MD   10 mg at 10/21/17 1811  . bisacodyl (DULCOLAX) EC tablet 5 mg  5 mg Oral Daily PRN Max Sane, MD      . calcium acetate (PHOSLO) capsule 1,334 mg  1,334 mg Oral TID WC Max Sane, MD   1,334 mg at 10/21/17 1700  . cholecalciferol (VITAMIN D) tablet 2,000 Units  2,000 Units Oral Daily Max Sane, MD   2,000 Units at 10/21/17 1031  . guaiFENesin (MUCINEX) 12 hr tablet 600 mg  600 mg Oral QHS PRN Max Sane, MD       And  . dextromethorphan (DELSYM) 30  MG/5ML liquid 30 mg  30 mg Oral QHS PRN Max Sane, MD      . docusate sodium (COLACE) capsule 100 mg  100 mg Oral BID Max Sane, MD   100 mg at 10/20/17 2059  . febuxostat (ULORIC) tablet 40 mg  40 mg Oral QPM Max Sane, MD   40 mg at 10/21/17 1811  . HYDROcodone-acetaminophen (NORCO/VICODIN) 5-325 MG per tablet 1-2 tablet  1-2 tablet Oral Q4H PRN Max Sane, MD      . ketorolac (TORADOL) 30 MG/ML injection 15 mg  15 mg Intravenous Q6H PRN Max Sane, MD      . levalbuterol (XOPENEX) nebulizer solution 1.25 mg  1.25 mg Nebulization Once Alfred Levins, [place], MD      . montelukast (SINGULAIR) tablet 10 mg  10 mg Oral QPM Max Sane, MD   10 mg at 10/21/17 1811  . multivitamin (RENA-VIT) tablet 1 tablet  1 tablet Oral Daily Max Sane, MD   1 tablet at 10/21/17 1032  . nystatin (MYCOSTATIN) 100000 UNIT/ML suspension 500,000 Units  5 mL Oral QID Fritzi Mandes, MD   500,000 Units at 10/22/17 0756  . ondansetron (ZOFRAN) tablet 4 mg  4 mg Oral Q6H PRN Max Sane, MD       Or  .  ondansetron (ZOFRAN) injection 4 mg  4 mg Intravenous Q6H PRN Max Sane, MD      . pantoprazole (PROTONIX) EC tablet 40 mg  40 mg Oral Daily Manuella Ghazi, [place], MD   40 mg at 10/21/17 1031  . predniSONE (DELTASONE) tablet 20 mg  20 mg Oral Q breakfast Singh, Harmeet, MD      . sodium chloride flush (NS) 0.9 % injection 3 mL  3 mL Intravenous Q12H Christell Faith M, PA-C   3 mL at 10/22/17 0757  . sodium chloride flush (NS) 0.9 % injection 3 mL  3 mL Intravenous PRN Dunn, Ryan M, PA-C      . umeclidinium-vilanterol (ANORO ELLIPTA) 62.5-25 MCG/INH 1 puff  1 puff Inhalation Daily Max Sane, MD   1 puff at 10/22/17 0756  . Warfarin - Pharmacist Dosing Inpatient   Does not apply q1800 Max Sane, MD       Facility-Administered Medications Ordered in Other Encounters  Medication Dose Route Frequency Provider Last Rate Last Dose  . 0.9 %  sodium chloride infusion    Continuous PRN Johnna Acosta, CRNA        Past Medical  History:  Diagnosis Date  . Arthritis   . Atrial flutter ([place])    a. s/p DCCV 07/2017 briefly successful; b. amio and warfarin; c. CHADS2VASc => 5 (CHF, HTN, age x 2, vascular disease);  [name] CAD (coronary artery disease)    a. remote inferior MI s/p stenting; b. Myoview in 10/2016 with normal perfusion with an EF of 46%  . COPD (chronic obstructive pulmonary disease) ([place])   . ESRD (end stage renal disease) on dialysis ([place])    a. HD MWF  . GERD (gastroesophageal reflux disease)   . Hyperlipidemia   . Hypertension   . Ischemic cardiomyopathy    a. TTE 05/2017: EF 35-40%, unable to exlcude RWMA, mild MR, mildly dilated RV  . Prostate cancer ([place])   . Shortness of breath dyspnea     Past Surgical History:  Procedure Laterality Date  . CARDIOVERSION N/A 07/26/2017   Procedure: Cardioversion;  Surgeon: Corey Skains, MD;  Location: [facility]S;  Service: Cardiovascular;  Laterality: N/A;  . CARDIOVERSION N/A 08/18/2017   Procedure: CARDIOVERSION;  Surgeon: Corey Skains, MD;  Location: [facility];  Service: Cardiovascular;  Laterality: N/A;  . CARDIOVERSION N/A 10/21/2017   Procedure: CARDIOVERSION;  Surgeon: Wellington Hampshire, MD;  Location: [facility] OR[facility];  Service: Cardiovascular;  Laterality: N/A;  . CATARACT EXTRACTION W/PHACO Right 08/20/2015   Procedure: CATARACT EXTRACTION PHACO AND INTRAOCULAR LENS PLACEMENT ([place]);  Surgeon: Leandrew Koyanagi, MD;  Location: [place];  Service: Ophthalmology;  Laterality: Right;  . CATARACT EXTRACTION W/PHACO Left 09/10/2015   Procedure: CATARACT EXTRACTION PHACO AND INTRAOCULAR LENS PLACEMENT (I[facility]);  Surgeon: Leandrew Koyanagi, MD;  Location: [place];  Service: Ophthalmology;  Laterality: Left;  . CORONARY STENT PLACEMENT    . 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 PERIPHERAL VASCULAR CATHETERIZATION Left 05/20/2015   Procedure: A/V Shuntogram/Fistulagram;  Surgeon: Katha Cabal, MD;  Location: [place]  CV LAB;  Service: Cardiovascular;  Laterality: Left;  . PERIPHERAL VASCULAR CATHETERIZATION N/A 05/20/2015   Procedure: A/V Shunt Intervention;  Surgeon: Katha Cabal, MD;  Location: [place] [facility];  Service: Cardiovascular;  Laterality: N/A;  . PERIPHERAL VASCULAR CATHETERIZATION Left 08/05/2015   Procedure: A/V Shuntogram/Fistulagram;  Surgeon: Katha Cabal, MD;  Location: [place] [facility];  Service: Cardiovascular;  Laterality: Left;  . PERIPHERAL VASCULAR CATHETERIZATION N/A 08/05/2015   Procedure: A/V Shunt Intervention;  Surgeon: Katha Cabal, MD;  Location: [place] [facility];  Service: Cardiovascular;  Laterality: N/A;  . PERIPHERAL VASCULAR CATHETERIZATION Left 01/06/2016   Procedure: A/V Shuntogram/Fistulagram;  Surgeon: Katha Cabal, MD;  Location: [place] [facility];  Service: Cardiovascular;  Laterality: Left;  . PERIPHERAL VASCULAR CATHETERIZATION N/A 01/06/2016   Procedure: A/V Shunt Intervention;  Surgeon: Katha Cabal, MD;  Location: [place] [facility];  Service: Cardiovascular;  Laterality: N/A;  . PERIPHERAL VASCULAR CATHETERIZATION N/A 12/13/2016   Procedure: Dialysis/Perma Catheter Removal;  Surgeon: Algernon Huxley, MD;  Location: [place] [facility];  Service: Cardiovascular;  Laterality: N/A;    Social History Social History  Substance Use Topics  . Smoking status: Former Smoker    Packs/day: 1.00    Years: 30.00    Types: Cigarettes    Quit date: 05/19/2009  . Smokeless tobacco: Never Used  . Alcohol use No    Family History Family History  Problem Relation Age of Onset  . Liver disease Mother   . Heart disease Mother   . Hypertension Mother   . Pancreatic cancer Father   No family history of bleeding/clotting disorders, porphyria or autoimmune disease   Allergies  Allergen Reactions  . Ace Inhibitors Nausea And Vomiting  . Amoxicillin-Pot Clavulanate Nausea And Vomiting       . Other Nausea And Vomiting     Anti-inflammatories     REVIEW OF SYSTEMS (Negative unless checked)  Constitutional: [] Weight loss  [] Fever  [] Chills Cardiac: [] Chest pain   [] Chest pressure   [] Palpitations   [] Shortness of breath when laying flat   [] Shortness of breath at rest   [] Shortness of breath with exertion. Vascular:  [] Pain in legs with walking   [] Pain in legs at rest   [] Pain in legs when laying flat   [] Claudication   [] Pain in feet when walking  [] Pain in feet at rest  [] Pain in feet when laying flat   [] History of DVT   [] Phlebitis   [] Swelling in legs   [] Varicose veins   [] Non-healing ulcers Pulmonary:   [] Uses home oxygen   [] Productive cough   [] Hemoptysis   [] Wheeze  [] COPD   [] Asthma Neurologic:  [] Dizziness  [] Blackouts   [] Seizures   [] History of stroke   [] History of TIA  [] Aphasia   [] Temporary blindness   [] Dysphagia   [] Weakness or numbness in arms   [] Weakness or numbness in legs Musculoskeletal:  [] Arthritis   [] Joint swelling   [] Joint pain   [] Low back pain Hematologic:  [] Easy bruising  [] Easy bleeding   [] Hypercoagulable state   [] Anemic  [] Hepatitis Gastrointestinal:  [] Blood in stool   [] Vomiting blood  [] Gastroesophageal reflux/heartburn   [] Difficulty swallowing. Genitourinary:  [x] Chronic kidney disease   [] Difficult urination  [] Frequent urination  [] Burning with urination   [] Blood in urine Skin:  [] Rashes   [] Ulcers   [] Wounds Psychological:  [] History of anxiety   []  History of major depression.  Physical Examination  Vitals:   10/21/17 1949 10/22/17 0439 10/22/17 0442 10/22/17 0753  BP: 91/71 (!) 86/63 96/62 122/82  Pulse: 86 79  89  Resp: 18 18  18   Temp: 97.9 F (36.6 C) 97.6 F (36.4 C)  97.8 F (36.6 C)  TempSrc: Oral Oral  Oral  SpO2: 97% 94%  99%  Weight:  115.8 kg (255 lb 6.4 oz)    Height:  Body mass index is 37.72 kg/m. Gen:  WD/WN, NAD Head: [place]/AT, No temporalis wasting. Prominent temp pulse not noted. Ear/Nose/Throat: Hearing grossly intact, nares  w/o erythema or drainage, oropharynx w/o Erythema/Exudate Eyes: Sclera non-icteric, conjunctiva clear Neck: Trachea midline.  No JVD.  Pulmonary:  Good air movement, respirations not labored, equal bilaterally.  Cardiac: RRR, normal S1, S2. Vascular: Left wrist fistula no thrill no bruit Vessel Right Left  Radial Palpable Palpable  Ulnar Palpable Palpable  Brachial Palpable Palpable  Carotid Palpable, without bruit Palpable, without bruit  Femoral Palpable Palpable  Gastrointestinal: soft, non-tender/non-distended. No guarding/reflex.  Musculoskeletal: M/S 5/5 throughout.  Extremities without ischemic changes.  No deformity or atrophy. No edema. Neurologic: Sensation grossly intact in extremities.  Symmetrical.  Speech is fluent. Motor exam as listed above. Psychiatric: Judgment intact, Mood & affect appropriate for pt's clinical situation. Dermatologic: Diffuse maculopapular rashes no ulcers noted.  No cellulitis or open wounds. Lymph : No Cervical, Axillary, or Inguinal lymphadenopathy.      CBC Lab Results  Component Value Date   WBC 21.2 (H) 10/21/2017   HGB 11.7 (L) 10/21/2017   HCT 37.3 (L) 10/21/2017   MCV 94.5 10/21/2017   PLT 126 (L) 10/21/2017    BMET    Component Value Date/Time   NA 131 (L) 10/21/2017 0344   NA 141 10/18/2017 1030   NA 141 12/17/2014 1034   K 5.7 (H) 10/21/2017 0344   K 4.5 12/17/2014 1034   CL 92 (L) 10/21/2017 0344   CL 108 (H) 12/17/2014 1034   CO2 25 10/21/2017 0344   CO2 26 12/17/2014 1034   GLUCOSE 102 (H) 10/21/2017 0344   GLUCOSE 98 12/17/2014 1034   BUN 91 (H) 10/21/2017 0344   BUN 57 (H) 10/18/2017 1030   BUN 45 (H) 12/17/2014 1034   CREATININE 7.31 (H) 10/21/2017 0344   CREATININE 4.30 (H) 12/17/2014 1034   CALCIUM 8.1 (L) 10/21/2017 0344   CALCIUM 7.7 (L) 12/17/2014 1034   GFRNONAA 6 (L) 10/21/2017 0344   GFRNONAA 15 (L) 12/17/2014 1034   GFRNONAA 15 (L) 07/25/2014 1040   GFRAA 7 (L) 10/21/2017 0344   GFRAA 18 (L)  12/17/2014 1034   GFRAA 17 (L) 07/25/2014 1040   Estimated Creatinine Clearance: 11 mL/min (A) (by C-G formula based on SCr of 7.31 mg/dL (H)).  COAG Lab Results  Component Value Date   INR 2.62 10/22/2017   INR 2.17 10/21/2017   INR 2.24 10/20/2017    Radiology Ct Angio Chest Pe W And/or Wo Contrast  Result Date: 10/19/2017 CLINICAL DATA:  Chest pain and shortness of breath.  Hypotension. EXAM: CT ANGIOGRAPHY CHEST WITH CONTRAST TECHNIQUE: Multidetector CT imaging of the chest was performed using the standard protocol during bolus administration of intravenous contrast. Multiplanar CT image reconstructions and MIPs were obtained to evaluate the vascular anatomy. CONTRAST:  75 cc Isovue 370. COMPARISON:  CT abdomen pelvis 05/29/2011. FINDINGS: Cardiovascular: Negative for pulmonary embolus. Atherosclerotic calcification of the arterial vasculature, including severe involvement of coronary arteries. Heart is enlarged. No pericardial effusion. Mediastinum/Nodes: Mediastinal and hilar lymph nodes are not enlarged by CT size criteria. No axillary adenopathy. Esophagus is grossly unremarkable. Lungs/Pleura: Image quality is degraded by expiratory phase imaging. 3 mm subpleural lingular nodule likely a subpleural lymph node. Lungs are otherwise clear. No pleural fluid. Airway is unremarkable. Upper Abdomen: Reflux of contrast into the IVC and hepatic veins. Visualized portions of the liver, gallbladder and adrenal glands are unremarkable. Partially imaged area of presumed  fat necrosis along the interpolar right kidney, likely smaller than on 05/29/2011. Focal area of hyperdensity along the upper pole the right kidney and subcentimeter low-attenuation lesion along the medial left kidney are too small to characterize. Visualized portions of the spleen, pancreas, stomach and bowel are grossly unremarkable. Musculoskeletal: No worrisome lytic or sclerotic lesions. Mild anterior wedging of a lower thoracic  vertebral body, likely remote. Review of the MIP images confirms the above findings. IMPRESSION: 1. Negative for pulmonary embolus. 2. Aortic atherosclerosis (ICD10-170.0). Severe coronary artery calcification. Cardiac enlargement. Electronically Signed   By: Lorin Picket M.D.   On: 10/19/2017 16:23   Dg Chest Portable 1 View  Result Date: 10/19/2017 CLINICAL DATA:  Shortness of breath and chest pain after dialysis today. EXAM: PORTABLE CHEST 1 VIEW COMPARISON:  06/20/2017. FINDINGS: Trachea is midline. Heart is enlarged. Lungs are clear. No pleural fluid. IMPRESSION: No acute findings. Electronically Signed   By: Lorin Picket M.D.   On: 10/19/2017 14:45      Assessment/Plan 1.  Complication dialysis device: The patient should undergo a declot of his left wrist fistula.  Successful declot may be difficult as this access has had multiple problems in the past.  If a appropriate flow cannot be restored he will require placement of a tunneled catheter.  This will require adjustment of his INR.  In preparation he will undergo placement of a temporary dialysis catheter so his dialysis can be maintained in the interim.  The risks and benefits were reviewed with the patient all questions have been answered patient has agreed to proceed.  2. Atrial fibrillation/flutter: He is status post successful cardioversion, resume amiodarone at 400 twice a day 2 weeks and then 400 daily for a month per Dr Caryl Comes on coumadin with therapeutic INR 2.57 this may need to be adjusted so that his access can be secured  3. Ischemic cardiomyopathy/Congestive heart failure-chronic-systolic class III: Volume well controlled, well compensated at this time monitor while on IVFs due to suspected sepsis  4. End-stage renal disease on hemodialysis: Nephrology on consult.  Will place temporary catheter so that his dialysis can be maintained and we will move forward with restoration of his access as noted above.     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  He is currently being followed by [facility] dermatology but I would concur with rheumatology seeing him   Hortencia Pilar, MD  10/22/2017 2:01 PM    This note was created with Dragon medical transcription system.  Any error is purely unintentional

## 2017-10-22 NOTE — Progress Notes (Signed)
Hd start, alert, no c/o, stable, delay d/t cvc had to be placed for hd

## 2017-10-22 NOTE — Progress Notes (Signed)
Hd end 

## 2017-10-22 NOTE — Progress Notes (Signed)
Pre hd info 

## 2017-10-22 NOTE — Progress Notes (Signed)
Pre hd assessment  

## 2017-10-22 NOTE — Progress Notes (Signed)
ANTICOAGULATION CONSULT NOTE - Consult  Pharmacy Consult for warfarin Indication: atrial fibrillation  Allergies  Allergen Reactions  . Ace Inhibitors Nausea And Vomiting  . Amoxicillin-Pot Clavulanate Nausea And Vomiting       . Other Nausea And Vomiting    Anti-inflammatories    Patient Measurements: Height: 5\' 9"  (175.3 cm) Weight: 255 lb 6.4 oz (115.8 kg) IBW/kg (Calculated) : 70.7 Heparin Dosing Weight: 113.6 kg  Vital Signs: Temp: 97.6 F (36.4 C) (10/27 0439) Temp Source: Oral (10/27 0439) BP: 122/82 (10/27 0753) Pulse Rate: 89 (10/27 0753)  Labs:  Recent Labs  10/19/17 1446  10/19/17 1713  10/20/17 0206 10/20/17 0758 10/20/17 1427 10/21/17 0344 10/21/17 0440 10/22/17 0430  HGB 12.4*  --   --   --  12.1*  --   --  11.7*  --   --   HCT 39.1*  --   --   --  37.7*  --   --  37.3*  --   --   PLT 186  --   --   --  113*  --   --  126*  --   --   APTT  --   --  37*  --   --   --   --   --   --   --   LABPROT  --   < > 27.4*  --  24.6*  --   --   --  24.0* 27.8*  INR  --   < > 2.57  --  2.24  --   --   --  2.17 2.62  CREATININE 4.09*  --   --   --  5.59*  --   --  7.31*  --   --   TROPONINI 0.05*  --   --   < > 0.05* 0.05* 0.07*  --   --   --   < > = values in this interval not displayed.  Estimated Creatinine Clearance: 11 mL/min (A) (by C-G formula based on SCr of 7.31 mg/dL (H)).   Medical History: Past Medical History:  Diagnosis Date  . Arthritis   . Atrial flutter ([place])    a. s/p DCCV 07/2017 briefly successful; b. amio and warfarin; c. CHADS2VASc => 5 (CHF, HTN, age x 2, vascular disease);  [name] CAD (coronary artery disease)    a. remote inferior MI s/p stenting; b. Myoview in 10/2016 with normal perfusion with an EF of 46%  . COPD (chronic obstructive pulmonary disease) ([place])   . ESRD (end stage renal disease) on dialysis ([place])    a. HD MWF  . GERD (gastroesophageal reflux disease)   . Hyperlipidemia   . Hypertension   . Ischemic cardiomyopathy     a. TTE 05/2017: EF 35-40%, unable to exlcude RWMA, mild MR, mildly dilated RV  . Prostate cancer ([place])   . Shortness of breath dyspnea     Medications:  Scheduled:  . amiodarone  400 mg Oral BID  . atorvastatin  10 mg Oral QPM  . calcium acetate  1,334 mg Oral TID WC  . cholecalciferol  2,000 Units Oral Daily  . docusate sodium  100 mg Oral BID  . febuxostat  40 mg Oral QPM  . levalbuterol  1.25 mg Nebulization Once  . montelukast  10 mg Oral QPM  . multivitamin  1 tablet Oral Daily  . nystatin  5 mL Oral QID  . pantoprazole  40 mg Oral Daily  . predniSONE  20 mg Oral Q breakfast  . sodium chloride flush  3 mL Intravenous Q12H  . umeclidinium-vilanterol  1 puff Inhalation Daily  . warfarin  3 mg Oral QPM  . Warfarin - Pharmacist Dosing Inpatient   Does not apply q1800    Assessment: Patient admitted for CP/SOB was found to be septic. Is anticoagulated w/ warfarin for chronic afib. Warfarin PTA: 3 mg daily at 6 pm.   INR  Dose  10/23   3.2 10/24   2.57 --- 10/25 2.24 3mg  10/26 2.17     3 mg  10/27   2.62  INR is currently in therapeutic range.  Goal of Therapy:  INR 2-3 Monitor platelets by anticoagulation protocol: Yes   Plan:  INR therapeutic; however increase by .4 overnight. Will give warfarin 1.5 mg PO x 1 dose and will then continue home regimen.   Larene Beach, PharmD 10/22/2017

## 2017-10-23 LAB — GLUCOSE, CAPILLARY: GLUCOSE-CAPILLARY: 133 mg/dL — AB (ref 65–99)

## 2017-10-23 LAB — PROTIME-INR
INR: 2.64
Prothrombin Time: 28 seconds — ABNORMAL HIGH (ref 11.4–15.2)

## 2017-10-23 MED ORDER — CLINDAMYCIN PHOSPHATE 300 MG/50ML IV SOLN
300.0000 mg | INTRAVENOUS | Status: AC
  Administered 2017-10-24: 300 mg via INTRAVENOUS
  Filled 2017-10-23: qty 50

## 2017-10-23 MED ORDER — PHYTONADIONE 5 MG PO TABS
2.5000 mg | ORAL_TABLET | Freq: Once | ORAL | Status: AC
  Administered 2017-10-23: 2.5 mg via ORAL
  Filled 2017-10-23: qty 1

## 2017-10-23 MED ORDER — PHYTONADIONE 5 MG PO TABS
5.0000 mg | ORAL_TABLET | Freq: Once | ORAL | Status: DC
  Filled 2017-10-23: qty 1

## 2017-10-23 NOTE — Progress Notes (Signed)
[place] [facility] [place] NAME: Maurice Peterson    MR#:  [id number]  DATE OF BIRTH:  [birth year]  SUBJECTIVE:  Doing well. HD access clotted No new complaints  REVIEW OF SYSTEMS:   Review of Systems  Constitutional: Negative for chills, fever and weight loss.  HENT: Negative for ear discharge, ear pain and nosebleeds.   Eyes: Negative for blurred vision, pain and discharge.  Respiratory: Negative for sputum production, shortness of breath, wheezing and stridor.   Cardiovascular: Negative for chest pain, palpitations, orthopnea and PND.  Gastrointestinal: Negative for abdominal pain, diarrhea, nausea and vomiting.  Genitourinary: Negative for frequency and urgency.  Musculoskeletal: Negative for back pain and joint pain.  Skin: Positive for rash.  Neurological: Positive for weakness. Negative for sensory change, speech change and focal weakness.  Psychiatric/Behavioral: Negative for depression and hallucinations. The patient is not nervous/anxious.    Tolerating Diet:yes Tolerating PT: not needed  DRUG ALLERGIES:   Allergies  Allergen Reactions  . Ace Inhibitors Nausea And Vomiting  . Amoxicillin-Pot Clavulanate Nausea And Vomiting       . Other Nausea And Vomiting    Anti-inflammatories    VITALS:  Blood pressure 110/74, pulse 88, temperature 97.9 F (36.6 C), temperature source Oral, resp. rate 18, height 5\' 9"  (1.753 m), weight 114.8 kg (253 lb), SpO2 96 %.  PHYSICAL EXAMINATION:   Physical Exam  GENERAL:  76 y.o.-year-old patient lying in the bed with no acute distress.  EYES: Pupils equal, round, reactive to light and accommodation. No scleral icterus. Extraocular muscles intact.  HEENT: Head atraumatic, normocephalic. Oropharynx and nasopharynx clear.  NECK:  Supple, no jugular venous distention. No thyroid enlargement, no tenderness.  LUNGS: Normal breath sounds bilaterally, no wheezing, rales, rhonchi. No use of accessory  muscles of respiration.  CARDIOVASCULAR: S1, S2 normal. No murmurs, rubs, or gallops.  ABDOMEN: Soft, nontender, nondistended. Bowel sounds present. No organomegaly or mass.  EXTREMITIES: No cyanosis, clubbing or edema b/l.    NEUROLOGIC: Cranial nerves II through XII are intact. No focal Motor or sensory deficits b/l.   PSYCHIATRIC:  patient is alert and oriented x 3.  SKIN: vasculitic rash+ on Hands   LABORATORY PANEL:  CBC  Recent Labs Lab 10/21/17 0344  WBC 21.2*  HGB 11.7*  HCT 37.3*  PLT 126*    Chemistries   Recent Labs Lab 10/19/17 1446  10/21/17 0344  NA 136  < > 131*  K 4.1  < > 5.7*  CL 95*  < > 92*  CO2 27  < > 25  GLUCOSE 108*  < > 102*  BUN 35*  < > 91*  CREATININE 4.09*  < > 7.31*  CALCIUM 8.0*  < > 8.1*  AST 16  --   --   ALT 24  --   --   ALKPHOS 45  --   --   BILITOT 1.1  --   --   < > = values in this interval not displayed. Cardiac Enzymes  Recent Labs Lab 10/20/17 1427  TROPONINI 0.07*   RADIOLOGY:  No results found. ASSESSMENT AND PLAN:  76 y m with known history ofESRD on HD (MWF), atrial flutter on Coumadin, COPD, hypertension and hyperlipidemia being admitted for possible sepsis  * Atrial fibrillation/flutter -Patient is status post cardioversion now in sinus rhythm - resume amiodarone at400 twice a day 2 weeks and then 200 twice daily - on coumadin with therapeutic INR 2.57--COUMDAIN ON HOLD  form today due to declotting procedure for Monday - patient will get INR checked at Dr. 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)  * HD access malfunctioning -Temp catheter placed on 10/23/17 by  Dr Delana Meyer--- hold coumadin---declotting on Monday -INR 2.64 will give 2.5  mg po vitamin K today. D/w dr Ronalee Belts   Discussed with patient and wife. D/w dr Candiss Norse and dr schneir  Case discussed with Care Management/Social Worker. Management plans discussed with the patient, family and they are in agreement.  CODE STATUS: *full code DVT Prophylaxis: hcoumadin  TOTAL TIME TAKING CARE OF THIS PATIENT: *25* minutes.  >50% time spent on counselling and coordination of care  POSSIBLE D/C IN *1-2* DAYS, DEPENDING ON CLINICAL CONDITION.  Note: This dictation was prepared with Dragon dictation along with smaller phrase technology. Any transcriptional errors that result from this process are unintentional.  Mechel Haggard M.D on 10/23/2017 at 12:29 PM  Between 7am to 6pm - Pager - [phone]  After 6pm go to [email or web address] - password [email or web address]  Office  [phone]  CC: Primary care physician; Madelyn Brunner, MD

## 2017-10-23 NOTE — Progress Notes (Signed)
ANTICOAGULATION CONSULT NOTE - Consult  Pharmacy Consult for warfarin Indication: atrial fibrillation  Allergies  Allergen Reactions  . Ace Inhibitors Nausea And Vomiting  . Amoxicillin-Pot Clavulanate Nausea And Vomiting       . Other Nausea And Vomiting    Anti-inflammatories    Patient Measurements: Height: 5\' 9"  (175.3 cm) Weight: 253 lb (114.8 kg) IBW/kg (Calculated) : 70.7  Vital Signs: Temp: 98.1 F (36.7 C) (10/28 0415) Temp Source: Oral (10/28 0415) BP: 112/77 (10/28 0415) Pulse Rate: 91 (10/28 0415)  Labs:  Recent Labs  10/20/17 0758 10/20/17 1427 10/21/17 0344 10/21/17 0440 10/22/17 0430 10/23/17 0414  HGB  --   --  11.7*  --   --   --   HCT  --   --  37.3*  --   --   --   PLT  --   --  126*  --   --   --   LABPROT  --   --   --  24.0* 27.8* 28.0*  INR  --   --   --  2.17 2.62 2.64  CREATININE  --   --  7.31*  --   --   --   TROPONINI 0.05* 0.07*  --   --   --   --     Estimated Creatinine Clearance: 10.9 mL/min (A) (by C-G formula based on SCr of 7.31 mg/dL (H)).   Medical History: Past Medical History:  Diagnosis Date  . Arthritis   . Atrial flutter ([place])    a. s/p DCCV 07/2017 briefly successful; b. amio and warfarin; c. CHADS2VASc => 5 (CHF, HTN, age x 2, vascular disease);  [name] CAD (coronary artery disease)    a. remote inferior MI s/p stenting; b. Myoview in 10/2016 with normal perfusion with an EF of 46%  . COPD (chronic obstructive pulmonary disease) ([place])   . ESRD (end stage renal disease) on dialysis ([place])    a. HD MWF  . GERD (gastroesophageal reflux disease)   . Hyperlipidemia   . Hypertension   . Ischemic cardiomyopathy    a. TTE 05/2017: EF 35-40%, unable to exlcude RWMA, mild MR, mildly dilated RV  . Prostate cancer ([place])   . Shortness of breath dyspnea    Assessment: Patient admitted for CP/SOB was found to be septic. Is anticoagulated w/ warfarin for chronic afib.  Warfarin PTA: 3 mg daily at 6 pm.   INR  Dose  10/23    3.2 10/24   2.57 --- 10/25 2.24 3mg  10/26 2.17     3 mg  10/27   2.62 Held by MD 10/28 2.64 Hold per MD    Goal of Therapy:  INR 2-3 Monitor platelets by anticoagulation protocol: Yes   Plan:  INR remains therapeutic.  Per discussion with MD, MD would like to HOLD warfarin again today in anticipation for procedure on Monday.  Lenis Noon, PharmD, BCPS Clinical Pharmacist 10/23/2017

## 2017-10-23 NOTE — Progress Notes (Signed)
[facility], [name] 10/23/17  Subjective:   Patient underwent dialysis successfully yesterday Doing well this morning No shortness of breath Able to eat without any nausea or vomiting  Objective:  Vital signs in last 24 hours:  Temp:  [97.5 F (36.4 C)-98.4 F (36.9 C)] 97.9 F (36.6 C) (10/28 0735) Pulse Rate:  [85-98] 88 (10/28 0735) Resp:  [17-25] 18 (10/28 0415) BP: (89-112)/(64-81) 110/74 (10/28 0735) SpO2:  [94 %-98 %] 96 % (10/28 0735) Weight:  [114.8 kg (253 lb)-117.6 kg (259 lb 4.2 oz)] 114.8 kg (253 lb) (10/28 0415)  Weight change: 1.751 kg (3 lb 13.8 oz) Filed Weights   10/22/17 0439 10/22/17 1345 10/23/17 0415  Weight: 115.8 kg (255 lb 6.4 oz) 117.6 kg (259 lb 4.2 oz) 114.8 kg (253 lb)    Intake/Output:    Intake/Output Summary (Last 24 hours) at 10/23/17 1232 Last data filed at 10/23/17 1026  Gross per 24 hour  Intake              480 ml  Output             1300 ml  Net             -820 ml     Physical Exam: General: NAD, sitting on side of bed  HEENT Anicteric, moist mucus membranes  Neck Supple, no JVD  Pulm/lungs Mild rhonchi at bases  CVS/Heart Regular,    Abdomen:  Soft, NT  Extremities: + dependent edema  Neurologic: Alert, oriented  Skin: Petechial rash over legs, thighs, torso  Access: Left forearm AVF,  temporary dialysis catheter Bruit noted today but high-pitched sounds.       Basic Metabolic Panel:   Recent Labs Lab 10/18/17 1030 10/19/17 1446 10/20/17 0206 10/21/17 0344  NA 141 136 136 131*  K 5.2 4.1 5.2* 5.7*  CL 94* 95* 93* 92*  CO2 25 27 27 25   GLUCOSE 97 108* 151* 102*  BUN 57* 35* 49* 91*  CREATININE 5.80* 4.09* 5.59* 7.31*  CALCIUM 8.2* 8.0* 7.9* 8.1*  PHOS  --   --   --  11.1*     CBC:  Recent Labs Lab 10/18/17 1030 10/19/17 1446 10/20/17 0206 10/21/17 0344  WBC 22.3* 20.4* 19.7* 21.2*  NEUTROABS 19.6* 18.2*  --  18.6*  HGB 12.0* 12.4* 12.1* 11.7*  HCT 37.2* 39.1*  37.7* 37.3*  MCV 93 93.9 94.5 94.5  PLT 159 186 113* 126*     No results found for: HEPBSAG, HEPBSAB, HEPBIGM    Microbiology:  Recent Results (from the past 240 hour(s))  Blood culture (routine x 2)     Status: None (Preliminary result)   Collection Time: 10/19/17  4:30 PM  Result Value Ref Range Status   Specimen Description BLOOD RIGHT ARM  Final   Special Requests   Final    BOTTLES DRAWN AEROBIC AND ANAEROBIC Blood Culture adequate volume   Culture NO GROWTH 4 DAYS  Final   Report Status PENDING  Incomplete  Blood culture (routine x 2)     Status: None (Preliminary result)   Collection Time: 10/19/17  7:37 PM  Result Value Ref Range Status   Specimen Description BLOOD RIGHT SHOULDER  Final   Special Requests   Final    BOTTLES DRAWN AEROBIC AND ANAEROBIC Blood Culture adequate volume   Culture NO GROWTH 4 DAYS  Final   Report Status PENDING  Incomplete  MRSA PCR Screening     Status: None  Collection Time: 10/19/17  7:38 PM  Result Value Ref Range Status   MRSA by PCR NEGATIVE NEGATIVE Final    Comment:        The GeneXpert MRSA Assay (FDA approved for NASAL specimens only), is one component of a comprehensive MRSA colonization surveillance program. It is not intended to diagnose MRSA infection nor to guide or monitor treatment for MRSA infections.     Coagulation Studies:  Recent Labs  10/21/17 0440 10/22/17 0430 10/23/17 0414  LABPROT 24.0* 27.8* 28.0*  INR 2.17 2.62 2.64    Urinalysis: No results for input(s): COLORURINE, LABSPEC, PHURINE, GLUCOSEU, HGBUR, BILIRUBINUR, KETONESUR, PROTEINUR, UROBILINOGEN, NITRITE, LEUKOCYTESUR in the last 72 hours.  Invalid input(s): APPERANCEUR    Imaging: No results found.   Medications:   . sodium chloride     . amiodarone  400 mg Oral BID  . atorvastatin  10 mg Oral QPM  . calcium acetate  1,334 mg Oral TID WC  . cholecalciferol  2,000 Units Oral Daily  . docusate sodium  100 mg Oral BID  .  febuxostat  40 mg Oral QPM  . levalbuterol  1.25 mg Nebulization Once  . montelukast  10 mg Oral QPM  . multivitamin  1 tablet Oral Daily  . nystatin  5 mL Oral QID  . pantoprazole  40 mg Oral Daily  . predniSONE  20 mg Oral Q breakfast  . sodium chloride flush  3 mL Intravenous Q12H  . umeclidinium-vilanterol  1 puff Inhalation Daily  . Warfarin - Pharmacist Dosing Inpatient   Does not apply q1800   acetaminophen, albuterol, bisacodyl, guaiFENesin **AND** dextromethorphan, HYDROcodone-acetaminophen, ketorolac, ondansetron **OR** ondansetron (ZOFRAN) IV, sodium chloride flush  Assessment/ Plan:  76 y.o. 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  1. End-stage renal disease -Dialysis fistula is clotted -Temporary dialysis catheter placed on 10/27  And dialysis done Patient is on schedule for angiogram on Monday by vascular surgery Next HD after angiogram tomorrow.   2.  Anemia of chronic kidney disease Hemoglobin 11.7 We will hold Procrit for now  3.  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  Prednisone 20 mg. Taper as per [facility] direction  4.  Secondary hyperparathyroidism Continue PhosLo  5.  Hypotension from atrial flutter Cardioverted to sinus 10/22/17     LOS: 4 Amesha Bailey 10/28/201812:32 PM  [place], [place]

## 2017-10-24 ENCOUNTER — Telehealth: Payer: Self-pay

## 2017-10-24 ENCOUNTER — Other Ambulatory Visit: Payer: Self-pay

## 2017-10-24 ENCOUNTER — Encounter
Admission: EM | Disposition: A | Discharge status: Home / Self Care | Payer: Self-pay | Source: Home / Self Care | Attending: Internal Medicine

## 2017-10-24 DIAGNOSIS — N186 End stage renal disease: Secondary | ICD-10-CM

## 2017-10-24 DIAGNOSIS — T82868A Thrombosis of vascular prosthetic devices, implants and grafts, initial encounter: Secondary | ICD-10-CM

## 2017-10-24 HISTORY — PX: A/V SHUNT INTERVENTION: CATH118220

## 2017-10-24 LAB — CULTURE, BLOOD (ROUTINE X 2)
CULTURE: NO GROWTH
Culture: NO GROWTH
Special Requests: ADEQUATE
Special Requests: ADEQUATE

## 2017-10-24 LAB — CBC
HEMATOCRIT: 33.6 % — AB (ref 40.0–52.0)
Hemoglobin: 11.3 g/dL — ABNORMAL LOW (ref 13.0–18.0)
MCH: 31.1 pg (ref 26.0–34.0)
MCHC: 33.7 g/dL (ref 32.0–36.0)
MCV: 92.2 fL (ref 80.0–100.0)
Platelets: 108 10*3/uL — ABNORMAL LOW (ref 150–440)
RBC: 3.64 MIL/uL — AB (ref 4.40–5.90)
RDW: 17.4 % — ABNORMAL HIGH (ref 11.5–14.5)
WBC: 14.4 10*3/uL — AB (ref 3.8–10.6)

## 2017-10-24 LAB — RENAL FUNCTION PANEL
ALBUMIN: 3.1 g/dL — AB (ref 3.5–5.0)
ANION GAP: 13 (ref 5–15)
BUN: 96 mg/dL — ABNORMAL HIGH (ref 6–20)
CHLORIDE: 91 mmol/L — AB (ref 101–111)
CO2: 27 mmol/L (ref 22–32)
Calcium: 7.6 mg/dL — ABNORMAL LOW (ref 8.9–10.3)
Creatinine, Ser: 8.22 mg/dL — ABNORMAL HIGH (ref 0.61–1.24)
GFR, EST AFRICAN AMERICAN: 6 mL/min — AB (ref >60)
GFR, EST NON AFRICAN AMERICAN: 6 mL/min — AB (ref >60)
Glucose, Bld: 108 mg/dL — ABNORMAL HIGH (ref 65–99)
PHOSPHORUS: 9.8 mg/dL — AB (ref 2.5–4.6)
POTASSIUM: 4.8 mmol/L (ref 3.5–5.1)
Sodium: 131 mmol/L — ABNORMAL LOW (ref 135–145)

## 2017-10-24 LAB — GLUCOSE, CAPILLARY
Glucose-Capillary: 101 mg/dL — ABNORMAL HIGH (ref 65–99)
Glucose-Capillary: 96 mg/dL (ref 65–99)

## 2017-10-24 LAB — PROTIME-INR
INR: 2.02
Prothrombin Time: 22.7 seconds — ABNORMAL HIGH (ref 11.4–15.2)

## 2017-10-24 LAB — APTT: APTT: 32 s (ref 24–36)

## 2017-10-24 SURGERY — A/V SHUNT INTERVENTION
Anesthesia: Moderate Sedation | Laterality: Left

## 2017-10-24 MED ORDER — CLINDAMYCIN PHOSPHATE 300 MG/50ML IV SOLN
INTRAVENOUS | Status: AC
  Administered 2017-10-24: 300 mg via INTRAVENOUS
  Filled 2017-10-24: qty 50

## 2017-10-24 MED ORDER — HEPARIN (PORCINE) IN NACL 100-0.45 UNIT/ML-% IJ SOLN
1400.0000 [IU]/h | INTRAMUSCULAR | Status: DC
  Administered 2017-10-24: 1500 [IU]/h via INTRAVENOUS
  Filled 2017-10-24 (×3): qty 250

## 2017-10-24 MED ORDER — HEPARIN (PORCINE) IN NACL 2-0.9 UNIT/ML-% IJ SOLN
INTRAMUSCULAR | Status: AC
  Filled 2017-10-24: qty 1500

## 2017-10-24 MED ORDER — HEPARIN SODIUM (PORCINE) 1000 UNIT/ML IJ SOLN
INTRAMUSCULAR | Status: AC
  Filled 2017-10-24: qty 1

## 2017-10-24 MED ORDER — HEPARIN SODIUM (PORCINE) 1000 UNIT/ML IJ SOLN
INTRAMUSCULAR | Status: DC | PRN
  Administered 2017-10-24: 3000 [IU] via INTRAVENOUS

## 2017-10-24 MED ORDER — ONDANSETRON HCL 4 MG/2ML IJ SOLN: 4.0000 mg | Freq: Once | INTRAMUSCULAR | Status: DC | PRN

## 2017-10-24 MED ORDER — CALCIUM ACETATE (PHOS BINDER) 667 MG PO CAPS
2001.0000 mg | ORAL_CAPSULE | Freq: Three times a day (TID) | ORAL | Status: DC
  Administered 2017-10-24 – 2017-10-25 (×4): 2001 mg via ORAL
  Filled 2017-10-24 (×8): qty 3

## 2017-10-24 MED ORDER — MIDAZOLAM HCL 5 MG/5ML IJ SOLN
INTRAMUSCULAR | Status: AC
  Filled 2017-10-24: qty 5

## 2017-10-24 MED ORDER — FENTANYL CITRATE (PF) 100 MCG/2ML IJ SOLN
INTRAMUSCULAR | Status: DC | PRN
  Administered 2017-10-24 (×2): 50 ug via INTRAVENOUS

## 2017-10-24 MED ORDER — LIDOCAINE-EPINEPHRINE (PF) 1 %-1:200000 IJ SOLN
INTRAMUSCULAR | Status: AC
  Filled 2017-10-24: qty 30

## 2017-10-24 MED ORDER — FENTANYL CITRATE (PF) 100 MCG/2ML IJ SOLN
INTRAMUSCULAR | Status: AC
  Filled 2017-10-24: qty 2

## 2017-10-24 MED ORDER — IOPAMIDOL (ISOVUE-300) INJECTION 61%
INTRAVENOUS | Status: DC | PRN
  Administered 2017-10-24: 20 mL via INTRA_ARTERIAL

## 2017-10-24 MED ORDER — WARFARIN SODIUM 3 MG PO TABS
3.0000 mg | ORAL_TABLET | Freq: Every day | ORAL | Status: DC
  Administered 2017-10-24 – 2017-10-25 (×2): 3 mg via ORAL
  Filled 2017-10-24 (×2): qty 1

## 2017-10-24 MED ORDER — SODIUM CHLORIDE 0.9 % IV SOLN
INTRAVENOUS | Status: DC
  Administered 2017-10-24: 15:00:00 via INTRAVENOUS

## 2017-10-24 MED ORDER — MIDAZOLAM HCL 2 MG/2ML IJ SOLN
INTRAMUSCULAR | Status: DC | PRN
  Administered 2017-10-24 (×2): 2 mg via INTRAVENOUS

## 2017-10-24 MED ORDER — FENTANYL CITRATE (PF) 100 MCG/2ML IJ SOLN: 25.0000 ug | INTRAMUSCULAR | Status: DC | PRN

## 2017-10-24 SURGICAL SUPPLY — 13 items
BALLN LUTONIX DCB 7X60X130 (BALLOONS) ×3
BALLOON LUTONIX DCB 7X60X130 (BALLOONS) ×1 IMPLANT
CANNULA 5F STIFF (CANNULA) ×3 IMPLANT
CATH BEACON 5 .035 40 KMP TP (CATHETERS) ×1 IMPLANT
CATH BEACON 5 .038 40 KMP TP (CATHETERS) ×2
DEVICE PRESTO INFLATION (MISCELLANEOUS) ×3 IMPLANT
DRAPE BRACHIAL (DRAPES) ×3 IMPLANT
PACK ANGIOGRAPHY (CUSTOM PROCEDURE TRAY) ×3 IMPLANT
SHEATH BRITE TIP 6FRX5.5 (SHEATH) ×6 IMPLANT
SHIELD RADPAD DADD DRAPE 4X9 (MISCELLANEOUS) ×3 IMPLANT
SUT MNCRL AB 4-0 PS2 18 (SUTURE) ×3 IMPLANT
TOWEL OR 17X26 4PK STRL BLUE (TOWEL DISPOSABLE) ×3 IMPLANT
WIRE MAGIC TORQUE 260C (WIRE) ×3 IMPLANT

## 2017-10-24 NOTE — Progress Notes (Signed)
HD ended early d/t patient having procedure. Tolerated treatment well. Treatment discontinued with 32 minutes remaining. Goal met. Report called to primary RN

## 2017-10-24 NOTE — Progress Notes (Signed)
Pre HD  

## 2017-10-24 NOTE — Progress Notes (Signed)
Patient without complaints, taking po's without difficulty. Report called to 2a Janett Billow RN) with plan reviewed.

## 2017-10-24 NOTE — H&P (Signed)
[facility] History & Physical Update  The patient was interviewed and re-examined.  The patient's previous History and Physical has been reviewed and is unchanged.  There is no change in the plan of care. We plan to proceed with the scheduled procedure.  Leotis Pain, MD  10/24/2017, 2:56 PM

## 2017-10-24 NOTE — Op Note (Signed)
[name]    OPERATIVE NOTE   PROCEDURE: 1.   Left radiocephalic arteriovenous fistula cannulation under ultrasound guidance 2.   Left arm fistulagram including central venogram 3.   Percutaneous transluminal angioplasty of forearm cephalic vein with 3 inflations with a 7 mm diameter by 6 cm length Lutonix drug-coated angioplasty balloon  PRE-OPERATIVE DIAGNOSIS: 1. ESRD 2. Poorly functional left radiocephalic AVF  POST-OPERATIVE DIAGNOSIS: same as above   SURGEON: Leotis Pain, MD  ANESTHESIA: local with MCS  ESTIMATED BLOOD LOSS: 25 cc  FINDING(S): 1. Multiple areas of greater than 70% narrowing within the previously placed stent and just proximal to the previously placed stent in the forearm cephalic vein.  The outflow in the upper arm was largely through the basilic vein with what appeared to be occlusion of the cephalic vein.  Flow was traversing slowly through the central venous circulation but no obvious stenosis was identified.  The radiocephalic anastomosis appeared patent without significant stenosis.  SPECIMEN(S):  None  CONTRAST: 20 cc  FLUORO TIME: 1.8 minutes  MODERATE CONSCIOUS SEDATION TIME: Approximately 20 minutes with 4 mg of Versed and 100 Mcg of Fentanyl   INDICATIONS: Maurice Peterson is a 76 y.o. male who presents with malfunctioning left radiocephalic arteriovenous fistula.  The patient is scheduled for left arm fistulagram.  The patient is aware the risks include but are not limited to: bleeding, infection, thrombosis of the cannulated access, and possible anaphylactic reaction to the contrast.  The patient is aware of the risks of the procedure and elects to proceed forward.  DESCRIPTION: After full informed written consent was obtained, the patient was brought back to the angiography suite and placed supine upon the angiography table.  The patient was connected to monitoring equipment. Moderate conscious sedation was administered with  a face to face encounter with the patient throughout the procedure with my supervision of the RN administering medicines and monitoring the patient's vital signs and mental status throughout from the start of the procedure until the patient was taken to the recovery room. The left arm was prepped and draped in the standard fashion for a percutaneous access intervention.  Under ultrasound guidance, the left radiocephalic arteriovenous fistula was cannulated with a micropuncture needle under direct ultrasound guidance in a retrograde fashion just below the antecubital fossa and a permanent image was performed.  The microwire was advanced into the fistula and the needle was exchanged for the a microsheath.  I then upsized to a 6 Fr Sheath and imaging was performed.  Hand injections were completed to image the access including the central venous system. This demonstrated multiple areas of greater than 70% narrowing within the previously placed stent and just proximal to the previously placed stent in the forearm cephalic vein.  The outflow in the upper arm was largely through the basilic vein with what appeared to be occlusion of the cephalic vein.  Flow was traversing slowly through the central venous circulation but no obvious stenosis was identified.  The radiocephalic anastomosis appeared patent without significant stenosis.  Based on the images, this patient will need intervention to the forearm cephalic vein. I then gave the patient 3000 units of intravenous heparin.  I then crossed the stenosis with a Magic Tourqe wire.  Based on the imaging, a 7 mm x 6 cm Lutonix drug-coated angioplasty balloon was selected.  The balloon was centered around the proximal forearm cephalic vein stenosis and advanced with its distal tip couple of centimeters into the  previously placed stent and inflated to 12 ATM for 1 minute(s).  It was then advanced into the stents themselves and to further inflations to 14-16 atm for 1 minute  were performed within the stented cephalic vein.  On completion imaging, a less than 25 % residual stenosis was present with marked improvement in the appearance of the forearm cephalic vein.     Based on the completion imaging, no further intervention is necessary.  The wire and balloon were removed from the sheath.  A 4-0 Monocryl purse-string suture was sewn around the sheath.  The sheath was removed while tying down the suture.  A sterile bandage was applied to the puncture site.  COMPLICATIONS: None  CONDITION: Stable   Leotis Pain  10/24/2017 3:52 PM   This note was created with Dragon Medical transcription system. Any errors in dictation are purely unintentional.

## 2017-10-24 NOTE — Progress Notes (Signed)
[place], [name] 10/24/17  Subjective:  Patient seen at bedside. Due for hemodialysis today. Thereafter he is due for a fistulogram.   Objective:  Vital signs in last 24 hours:  Temp:  [97.5 F (36.4 C)-98.3 F (36.8 C)] 97.5 F (36.4 C) (10/29 1135) Pulse Rate:  [88-95] 92 (10/29 1215) Resp:  [13-19] 17 (10/29 1215) BP: (95-119)/(64-86) 105/72 (10/29 1215) SpO2:  [92 %-99 %] 96 % (10/29 1215) Weight:  [116.4 kg (256 lb 9.6 oz)-116.4 kg (256 lb 9.9 oz)] 116.4 kg (256 lb 9.9 oz) (10/29 1135)  Weight change: -1.207 kg (-2 lb 10.6 oz) Filed Weights   10/23/17 0415 10/24/17 0312 10/24/17 1135  Weight: 114.8 kg (253 lb) 116.4 kg (256 lb 9.6 oz) 116.4 kg (256 lb 9.9 oz)    Intake/Output:    Intake/Output Summary (Last 24 hours) at 10/24/17 1226 Last data filed at 10/24/17 0858  Gross per 24 hour  Intake              240 ml  Output              300 ml  Net              -60 ml     Physical Exam: General: NAD  HEENT Anicteric, moist mucus membranes  Neck Supple, no JVD  Pulm/lungs Clear to auscultation bilateral   CVS/Heart Irregular, no rubs  Abdomen:  Soft, NTND, BS present  Extremities: + dependent edema  Neurologic: Alert, oriented  Skin: Petechial rash over legs, thighs, torso  Access: Left forearm AVF,  temporary dialysis catheter Bruit noted but high-pitched sounds.       Basic Metabolic Panel:   Recent Labs Lab 10/18/17 1030 10/19/17 1446 10/20/17 0206 10/21/17 0344 10/24/17 0928  NA 141 136 136 131* 131*  K 5.2 4.1 5.2* 5.7* 4.8  CL 94* 95* 93* 92* 91*  CO2 25 27 27 25 27   GLUCOSE 97 108* 151* 102* 108*  BUN 57* 35* 49* 91* 96*  CREATININE 5.80* 4.09* 5.59* 7.31* 8.22*  CALCIUM 8.2* 8.0* 7.9* 8.1* 7.6*  PHOS  --   --   --  11.1* 9.8*     CBC:  Recent Labs Lab 10/18/17 1030 10/19/17 1446 10/20/17 0206 10/21/17 0344 10/24/17 0422  WBC 22.3* 20.4* 19.7* 21.2* 14.4*  NEUTROABS 19.6* 18.2*  --  18.6*  --    HGB 12.0* 12.4* 12.1* 11.7* 11.3*  HCT 37.2* 39.1* 37.7* 37.3* 33.6*  MCV 93 93.9 94.5 94.5 92.2  PLT 159 186 113* 126* 108*     No results found for: HEPBSAG, HEPBSAB, HEPBIGM    Microbiology:  Recent Results (from the past 240 hour(s))  Blood culture (routine x 2)     Status: None   Collection Time: 10/19/17  4:30 PM  Result Value Ref Range Status   Specimen Description BLOOD RIGHT ARM  Final   Special Requests   Final    BOTTLES DRAWN AEROBIC AND ANAEROBIC Blood Culture adequate volume   Culture NO GROWTH 5 DAYS  Final   Report Status 10/24/2017 FINAL  Final  Blood culture (routine x 2)     Status: None   Collection Time: 10/19/17  7:37 PM  Result Value Ref Range Status   Specimen Description BLOOD RIGHT SHOULDER  Final   Special Requests   Final    BOTTLES DRAWN AEROBIC AND ANAEROBIC Blood Culture adequate volume   Culture NO GROWTH 5 DAYS  Final  Report Status 10/24/2017 FINAL  Final  MRSA PCR Screening     Status: None   Collection Time: 10/19/17  7:38 PM  Result Value Ref Range Status   MRSA by PCR NEGATIVE NEGATIVE Final    Comment:        The GeneXpert MRSA Assay (FDA approved for NASAL specimens only), is one component of a comprehensive MRSA colonization surveillance program. It is not intended to diagnose MRSA infection nor to guide or monitor treatment for MRSA infections.     Coagulation Studies:  Recent Labs  10/22/17 0430 10/23/17 0414 10/24/17 0422  LABPROT 27.8* 28.0* 22.7*  INR 2.62 2.64 2.02    Urinalysis: No results for input(s): COLORURINE, LABSPEC, PHURINE, GLUCOSEU, HGBUR, BILIRUBINUR, KETONESUR, PROTEINUR, UROBILINOGEN, NITRITE, LEUKOCYTESUR in the last 72 hours.  Invalid input(s): APPERANCEUR    Imaging: No results found.   Medications:   . sodium chloride    . clindamycin (CLEOCIN) IV     . amiodarone  400 mg Oral BID  . atorvastatin  10 mg Oral QPM  . calcium acetate  1,334 mg Oral TID WC  . cholecalciferol   2,000 Units Oral Daily  . docusate sodium  100 mg Oral BID  . febuxostat  40 mg Oral QPM  . levalbuterol  1.25 mg Nebulization Once  . montelukast  10 mg Oral QPM  . multivitamin  1 tablet Oral Daily  . nystatin  5 mL Oral QID  . pantoprazole  40 mg Oral Daily  . predniSONE  20 mg Oral Q breakfast  . sodium chloride flush  3 mL Intravenous Q12H  . umeclidinium-vilanterol  1 puff Inhalation Daily  . Warfarin - Pharmacist Dosing Inpatient   Does not apply q1800   acetaminophen, albuterol, bisacodyl, guaiFENesin **AND** dextromethorphan, HYDROcodone-acetaminophen, ketorolac, ondansetron **OR** ondansetron (ZOFRAN) IV, sodium chloride flush  Assessment/ Plan:  76 y.o. 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  1. End-stage renal disease -Patient to have dialysis today prior to fistulogram. Orders have been prepared.   2.  Anemia of chronic kidney disease - Most recent hemoglobin was 11.3. Hold off on Procrit for now.  3.  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  4.  Secondary hyperparathyroidism Phosphorus quite high at 9.8.  Increase PhosLo to 3 tablets by mouth 3 times a day with meals.  5.  Hypotension from atrial flutter Seems his back and atrial flutter now. Await further cardiology input.     LOS: 5 Odelle Kosier 10/29/201812:26 PM  [facility], [place]

## 2017-10-24 NOTE — Progress Notes (Signed)
Patient clinically stable post procedure, vitals stable, wife at bedside. Dr Lucky Cowboy out to speak with patient and wife  With questions answered.denies complaints.

## 2017-10-24 NOTE — Progress Notes (Signed)
Per Dr. Posey Pronto , leave temp cath in place until further notice/ r groin site WNL/ will continue to monitor.

## 2017-10-24 NOTE — Progress Notes (Signed)
Pre hd assessment  

## 2017-10-24 NOTE — Progress Notes (Signed)
Progress Note  Patient Name: Maurice Peterson Date of Encounter: 10/24/2017  Primary Cardiologist: Dr. Nehemiah Massed /Dr. Caryl Comes (EP)  Subjective   Underwent successful DCCV on 10/26. Appears to be back in Afib/flutter this morning, EKG pending to confirm. HD access clotted off, underwent temporary dialysis catheter on 10/27 with successful completion of HD. Planning for vascular angiogram on 10/29. Coumadin has been on hold since 10/26. INR 2.02 this morning. No chest pain. Notes palpitations, feels like he is back in flutter per his report.   Inpatient Medications    Scheduled Meds: . amiodarone  400 mg Oral BID  . atorvastatin  10 mg Oral QPM  . calcium acetate  1,334 mg Oral TID WC  . cholecalciferol  2,000 Units Oral Daily  . docusate sodium  100 mg Oral BID  . febuxostat  40 mg Oral QPM  . levalbuterol  1.25 mg Nebulization Once  . montelukast  10 mg Oral QPM  . multivitamin  1 tablet Oral Daily  . nystatin  5 mL Oral QID  . pantoprazole  40 mg Oral Daily  . predniSONE  20 mg Oral Q breakfast  . sodium chloride flush  3 mL Intravenous Q12H  . umeclidinium-vilanterol  1 puff Inhalation Daily  . Warfarin - Pharmacist Dosing Inpatient   Does not apply q1800   Continuous Infusions: . sodium chloride    . clindamycin (CLEOCIN) IV     PRN Meds: acetaminophen, albuterol, bisacodyl, guaiFENesin **AND** dextromethorphan, HYDROcodone-acetaminophen, ketorolac, ondansetron **OR** ondansetron (ZOFRAN) IV, sodium chloride flush   Vital Signs    Vitals:   10/23/17 1653 10/23/17 1900 10/23/17 1955 10/24/17 0312  BP:   115/84 118/70  Pulse: 90  88 94  Resp:   17 17  Temp:   (!) 97.5 F (36.4 C) 97.6 F (36.4 C)  TempSrc:   Oral Oral  SpO2: 99% 97% 97% 98%  Weight:    256 lb 9.6 oz (116.4 kg)  Height:        Intake/Output Summary (Last 24 hours) at 10/24/17 0813 Last data filed at 10/24/17 0500  Gross per 24 hour  Intake              480 ml  Output              300 ml  Net               180 ml   Filed Weights   10/22/17 1345 10/23/17 0415 10/24/17 0312  Weight: 259 lb 4.2 oz (117.6 kg) 253 lb (114.8 kg) 256 lb 9.6 oz (116.4 kg)    Telemetry    Afib/flutter with variable AV block, 90s bpm - Personally Reviewed  ECG    Atypical atrial flutter, 91 bpm, bifascicular block - Personally Reviewed  Physical Exam   GEN: No acute distress.   Neck: No JVD. Cardiac: Irregular, no murmurs, rubs, or gallops.  Respiratory: Clear to auscultation bilaterally.  GI: Soft, nontender, non-distended.   MS: No edema; No deformity. Neuro:  Alert and oriented x 3; Nonfocal.  Psych: Normal affect.  Labs    Chemistry Recent Labs Lab 10/18/17 1030 10/19/17 1446 10/20/17 0206 10/21/17 0344  NA 141 136 136 131*  K 5.2 4.1 5.2* 5.7*  CL 94* 95* 93* 92*  CO2 25 27 27 25   GLUCOSE 97 108* 151* 102*  BUN 57* 35* 49* 91*  CREATININE 5.80* 4.09* 5.59* 7.31*  CALCIUM 8.2* 8.0* 7.9* 8.1*  PROT 6.5 6.9  --   --  ALBUMIN 4.2 3.7  --   --   AST 9 16  --   --   ALT 22 24  --   --   ALKPHOS 65 45  --   --   BILITOT 0.5 1.1  --   --   GFRNONAA 9* 13* 9* 6*  GFRAA 10* 15* 10* 7*  ANIONGAP  --  14 16* 14     Hematology Recent Labs Lab 10/20/17 0206 10/21/17 0344 10/24/17 0422  WBC 19.7* 21.2* 14.4*  RBC 3.99* 3.95* 3.64*  HGB 12.1* 11.7* 11.3*  HCT 37.7* 37.3* 33.6*  MCV 94.5 94.5 92.2  MCH 30.2 29.6 31.1  MCHC 32.0 31.4* 33.7  RDW 17.3* 17.8* 17.4*  PLT 113* 126* 108*    Cardiac Enzymes Recent Labs Lab 10/19/17 1937 10/20/17 0206 10/20/17 0758 10/20/17 1427  TROPONINI 0.05* 0.05* 0.05* 0.07*   No results for input(s): TROPIPOC in the last 168 hours.   BNPNo results for input(s): BNP, PROBNP in the last 168 hours.   DDimer No results for input(s): DDIMER in the last 168 hours.   Radiology    No results found.  Cardiac Studies   Echo 05/2017: Study Conclusions  - Left ventricle: The cavity size was normal. Wall thickness was   normal.  Systolic function was moderately reduced. The estimated   ejection fraction was in the range of 35% to 40%. Regional wall   motion abnormalities cannot be excluded. - Mitral valve: There was mild regurgitation. - Right ventricle: The cavity size was mildly dilated. Wall   thickness was normal.  Impressions:  - Normal study. Mild to moderate reduced overall left ventricular   function   Globally depressed LVF   ejection fraction between 35 and 40%   Mildly dilated right side  DCCV 10/21/2017: A standard informed consent was obtained. Timeout was performed. The pads were placed in the anterior posterior fashion. The patient was given propofol by the anesthesia team.  Phenylephrine boluses were given to support blood pressure. Successful cardioversion was performed with a 100 J. The patient converted to sinus rhythm. Pre-and post EKGs were reviewed. The patient tolerated the procedure with no immediate complications.  Recommendations: Continue Amiodarone and Warfarin.   Patient Profile     76 y.o. male with history of coronary artery disease status post remote inferior MI status post stenting, chronic systolic heart failure/ischemic cardiomyopathy, paroxysmal atrial flutter s/p DCCV on 10/21/17 on anticoagulation with warfarin and end-stage renal disease on hemodialysis who presented with atrial flutter with rapid ventricular response with associated hypotension.  Assessment & Plan    1. Afib/flutter: -Appears to be back in Afib/flutter with heart rates in the 90s bpm -EKG this morning showed patient is back in atypical flutter, rate controlled  -Coumadin has been held since 10/26 -Received vitamin K on 10/28  -INR 2.02 this morning -Recommend placing patient on heparin gtt following vascular procedure given recent cardioversion with bridge to Coumadin until INR > 2.0, will notify IM -Not on beta blocker or CCB given recent hypotension -Will keep on amiodarone load with plans  to repeat DCCV as an outpatient following longer amiodarone load -Would have EP round on patient 10/30 if in the hospital   2. CAD: -No symptoms concerning for ischemia -No plans for inpatient ischemic evaluation at this time -Continue current medication regimen  3. Hypotension: -Resolved  4. ESRD on HD: -AV graft occluded -For angiogram today -Coumadin has been held since 10/26 -Given vitamin K 10/28  For  questions or updates, please contact [place] Please consult [email or web address] for contact info under Cardiology/STEMI.    Signed, Christell Faith, PA-C [place] Pager: [phone] 10/24/2017, 8:13 AM

## 2017-10-24 NOTE — Discharge Instructions (Addendum)

## 2017-10-24 NOTE — Telephone Encounter (Signed)
lmtcb for lab results and recommendations 

## 2017-10-24 NOTE — Progress Notes (Signed)
Post HD assessment unchanged  

## 2017-10-24 NOTE — Progress Notes (Signed)
[facility] [place] NAME: Maurice Peterson    MR#:  [id number]  DATE OF BIRTH:  April 13, 1941  SUBJECTIVE:  Doing well. HD access clotted No new complaints  REVIEW OF SYSTEMS:   Review of Systems  Constitutional: Negative for chills, fever and weight loss.  HENT: Negative for ear discharge, ear pain and nosebleeds.   Eyes: Negative for blurred vision, pain and discharge.  Respiratory: Negative for sputum production, shortness of breath, wheezing and stridor.   Cardiovascular: Negative for chest pain, palpitations, orthopnea and PND.  Gastrointestinal: Negative for abdominal pain, diarrhea, nausea and vomiting.  Genitourinary: Negative for frequency and urgency.  Musculoskeletal: Negative for back pain and joint pain.  Skin: Positive for rash.  Neurological: Positive for weakness. Negative for sensory change, speech change and focal weakness.  Psychiatric/Behavioral: Negative for depression and hallucinations. The patient is not nervous/anxious.    Tolerating Diet:yes Tolerating PT: not needed  DRUG ALLERGIES:   Allergies  Allergen Reactions  . Ace Inhibitors Nausea And Vomiting  . Amoxicillin-Pot Clavulanate Nausea And Vomiting       . Other Nausea And Vomiting    Anti-inflammatories    VITALS:  Blood pressure 105/73, pulse (!) 101, temperature (!) 97.5 F (36.4 C), temperature source Oral, resp. rate (!) 21, height 5\' 9"  (1.753 m), weight 116.4 kg (256 lb 9.9 oz), SpO2 (!) 88 %.  PHYSICAL EXAMINATION:   Physical Exam  GENERAL:  76 y.o.-year-old patient lying in the bed with no acute distress.  EYES: Pupils equal, round, reactive to light and accommodation. No scleral icterus. Extraocular muscles intact.  HEENT: Head atraumatic, normocephalic. Oropharynx and nasopharynx clear.  NECK:  Supple, no jugular venous distention. No thyroid enlargement, no tenderness.  LUNGS: Normal breath sounds bilaterally, no wheezing, rales,  rhonchi. No use of accessory muscles of respiration.  CARDIOVASCULAR: S1, S2 normal. No murmurs, rubs, or gallops.  ABDOMEN: Soft, nontender, nondistended. Bowel sounds present. No organomegaly or mass.  EXTREMITIES: No cyanosis, clubbing or edema b/l.    NEUROLOGIC: Cranial nerves II through XII are intact. No focal Motor or sensory deficits b/l.   PSYCHIATRIC:  patient is alert and oriented x 3.  SKIN: vasculitic rash+ on Hands   LABORATORY PANEL:  CBC  Recent Labs Lab 10/24/17 0422  WBC 14.4*  HGB 11.3*  HCT 33.6*  PLT 108*    Chemistries   Recent Labs Lab 10/19/17 1446  10/24/17 0928  NA 136  < > 131*  K 4.1  < > 4.8  CL 95*  < > 91*  CO2 27  < > 27  GLUCOSE 108*  < > 108*  BUN 35*  < > 96*  CREATININE 4.09*  < > 8.22*  CALCIUM 8.0*  < > 7.6*  AST 16  --   --   ALT 24  --   --   ALKPHOS 45  --   --   BILITOT 1.1  --   --   < > = values in this interval not displayed. Cardiac Enzymes  Recent Labs Lab 10/20/17 1427  TROPONINI 0.07*   RADIOLOGY:  No results found. ASSESSMENT AND PLAN:  3 y m with known history ofESRD on HD (MWF), atrial flutter on Coumadin, COPD, hypertension and hyperlipidemia being admitted for possible sepsis  * Atrial fibrillation/flutter -Patient is status post cardioversion was in sinus rhythm---.  Atrial flutter in the 90s today - resume amiodarone at400 twice a day 2 weeks and  then 200 twice daily - on coumadin with therapeutic INR 2.57--COUMDAIN ON HOLD form today due to declotting procedure for Monday. -Cardiology recommends IV heparin drip to be started back after vascular declotting and resume Coumadin also if okay with vascular surgery.  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 D/w dr Ronalee Belts   Discussed with patient and wife.  Case discussed with Care Management/Social Worker. Management plans discussed with the patient, family and they are in agreement.  CODE STATUS: *full code DVT Prophylaxis: hcoumadin  TOTAL TIME TAKING CARE OF THIS PATIENT: *25* minutes.  >50% time spent on counselling and coordination of care  POSSIBLE D/C IN *1-2* DAYS, DEPENDING ON CLINICAL CONDITION.  Note: This dictation was prepared with Dragon dictation along with smaller phrase technology. Any transcriptional errors that result from this process are unintentional.  Kord Monette M.D on 10/24/2017 at 2:38 PM  Between 7am to 6pm - Pager - [phone]  After 6pm go to [email or web address] - password [email or web address] [facility]  Office  [phone]  CC: Primary care physician; Madelyn Brunner, MD

## 2017-10-24 NOTE — Progress Notes (Addendum)
ANTICOAGULATION CONSULT NOTE - Initial Consult  Pharmacy Consult for heparin Indication: AF  Allergies  Allergen Reactions  . Ace Inhibitors Nausea And Vomiting  . Amoxicillin-Pot Clavulanate Nausea And Vomiting       . Other Nausea And Vomiting    Anti-inflammatories    Patient Measurements: Height: 5\' 9"  (175.3 cm) Weight: 255 lb 8.2 oz (115.9 kg) IBW/kg (Calculated) : 70.7 Heparin Dosing Weight: 96.6 kg  Vital Signs: Temp: 98 F (36.7 C) (10/29 1453) Temp Source: Oral (10/29 1453) BP: 91/67 (10/29 1630) Pulse Rate: 85 (10/29 1630)  Labs:  Recent Labs  10/22/17 0430 10/23/17 0414 10/24/17 0422 10/24/17 0928  HGB  --   --  11.3*  --   HCT  --   --  33.6*  --   PLT  --   --  108*  --   LABPROT 27.8* 28.0* 22.7*  --   INR 2.62 2.64 2.02  --   CREATININE  --   --   --  8.22*    Estimated Creatinine Clearance: 9.8 mL/min (A) (by C-G formula based on SCr of 8.22 mg/dL (H)).   Medical History: Past Medical History:  Diagnosis Date  . Arthritis   . Atrial flutter ([place])    a. s/p DCCV 07/2017 briefly successful; b. amio and warfarin; c. CHADS2VASc => 5 (CHF, HTN, age x 2, vascular disease);  [name] CAD (coronary artery disease)    a. remote inferior MI s/p stenting; b. Myoview in 10/2016 with normal perfusion with an EF of 46%  . COPD (chronic obstructive pulmonary disease) ([place])   . ESRD (end stage renal disease) on dialysis ([place])    a. HD MWF  . GERD (gastroesophageal reflux disease)   . Hyperlipidemia   . Hypertension   . Ischemic cardiomyopathy    a. TTE 05/2017: EF 35-40%, unable to exlcude RWMA, mild MR, mildly dilated RV  . Prostate cancer ([place])   . Shortness of breath dyspnea     Medications:  Infusions:  . sodium chloride    . sodium chloride 10 mL/hr at 10/24/17 1503  . heparin      Assessment: 65 yom now post declotting catheter. Cardiology following for AF - patient on VKA PTA. Received vitamin K recently for procedure, pharmacy consulted to  manage UFH for bridging.   Goal of Therapy:  Heparin level 0.3-0.7 units/ml Monitor platelets by anticoagulation protocol: Yes   Plan: Maurice Peterson was therapeutic on heparin 1500 units/hr in June of this year, so we will start at that rate (no bolus per order). I spoke with Dr. Lucky Cowboy regarding initiation times post procedure and he tells me patient may start on UFH/VKA now as far as he is concerned.   Start heparin infusion at 1500 units/hr Check anti-Xa level in 8 hours and daily while on heparin Continue to monitor H&H and platelets  Laural Benes, Pharm.D., BCPS Clinical Pharmacist 10/24/2017,4:35 PM

## 2017-10-24 NOTE — Progress Notes (Signed)
ANTICOAGULATION CONSULT NOTE - Consult  Pharmacy Consult for warfarin Indication: atrial fibrillation  Allergies  Allergen Reactions  . Ace Inhibitors Nausea And Vomiting  . Amoxicillin-Pot Clavulanate Nausea And Vomiting       . Other Nausea And Vomiting    Anti-inflammatories    Patient Measurements: Height: 5\' 9"  (175.3 cm) Weight: 255 lb 8.2 oz (115.9 kg) IBW/kg (Calculated) : 70.7  Vital Signs: Temp: 98 F (36.7 C) (10/29 1453) Temp Source: Oral (10/29 1453) BP: 91/67 (10/29 1630) Pulse Rate: 85 (10/29 1630)  Labs:  Recent Labs  10/22/17 0430 10/23/17 0414 10/24/17 0422 10/24/17 0928  HGB  --   --  11.3*  --   HCT  --   --  33.6*  --   PLT  --   --  108*  --   LABPROT 27.8* 28.0* 22.7*  --   INR 2.62 2.64 2.02  --   CREATININE  --   --   --  8.22*    Estimated Creatinine Clearance: 9.8 mL/min (A) (by C-G formula based on SCr of 8.22 mg/dL (H)).   Medical History: Past Medical History:  Diagnosis Date  . Arthritis   . Atrial flutter ([place])    a. s/p DCCV 07/2017 briefly successful; b. amio and warfarin; c. CHADS2VASc => 5 (CHF, HTN, age x 2, vascular disease);  [name] CAD (coronary artery disease)    a. remote inferior MI s/p stenting; b. Myoview in 10/2016 with normal perfusion with an EF of 46%  . COPD (chronic obstructive pulmonary disease) ([place])   . ESRD (end stage renal disease) on dialysis ([place])    a. HD MWF  . GERD (gastroesophageal reflux disease)   . Hyperlipidemia   . Hypertension   . Ischemic cardiomyopathy    a. TTE 05/2017: EF 35-40%, unable to exlcude RWMA, mild MR, mildly dilated RV  . Prostate cancer ([place])   . Shortness of breath dyspnea    Assessment: Patient admitted for CP/SOB was found to be septic. Is anticoagulated w/ warfarin for chronic afib.  Warfarin PTA: 3 mg daily at 6 pm.  DATE INR  Dose  10/23   3.2 10/24   2.57 --- 10/25 2.24 3mg  10/26 2.17     3 mg  10/27   2.62 Held by MD 10/28 2.64 Hold per MD 10/29  2.02 3  mg    Goal of Therapy:  INR 2-3 Monitor platelets by anticoagulation protocol: Yes   Plan:  INR remains therapeutic.   Dr. Lucky Cowboy says patient may resume anticoagulation now as far as he is concerned. Will resume VKA PTA dose and follow INR per protocol.  Laural Benes, PharmD, BCPS Clinical Pharmacist 10/24/2017

## 2017-10-24 NOTE — Progress Notes (Signed)
HD initiated via R Fem cath without issue. Biopatch clean dry and intact, no drainage. No s/sx of infection. No heparin treatment. Patient to be dialyzed prior to procedure this afternoon. Currently without complaints.

## 2017-10-25 ENCOUNTER — Encounter: Payer: Self-pay | Admitting: *Deleted

## 2017-10-25 ENCOUNTER — Encounter: Payer: Self-pay | Admitting: Anesthesiology

## 2017-10-25 ENCOUNTER — Encounter
Admission: EM | Disposition: A | Discharge status: Home / Self Care | Payer: Self-pay | Source: Home / Self Care | Attending: Internal Medicine

## 2017-10-25 ENCOUNTER — Ambulatory Visit: Admission: RE | Admit: 2017-10-25 | Payer: Medicare Other | Source: Ambulatory Visit | Admitting: Internal Medicine

## 2017-10-25 LAB — HEPARIN LEVEL (UNFRACTIONATED)
HEPARIN UNFRACTIONATED: 0.4 [IU]/mL (ref 0.30–0.70)
HEPARIN UNFRACTIONATED: 0.86 [IU]/mL — AB (ref 0.30–0.70)
HEPARIN UNFRACTIONATED: 0.24 [IU]/mL — AB (ref 0.30–0.70)

## 2017-10-25 LAB — GLUCOSE, CAPILLARY: Glucose-Capillary: 95 mg/dL (ref 65–99)

## 2017-10-25 LAB — CBC
HCT: 38.1 % — ABNORMAL LOW (ref 40.0–52.0)
Hemoglobin: 12.3 g/dL — ABNORMAL LOW (ref 13.0–18.0)
MCH: 30.4 pg (ref 26.0–34.0)
MCHC: 32.2 g/dL (ref 32.0–36.0)
MCV: 94.3 fL (ref 80.0–100.0)
PLATELETS: 94 10*3/uL — AB (ref 150–440)
RBC: 4.04 MIL/uL — ABNORMAL LOW (ref 4.40–5.90)
RDW: 17.8 % — ABNORMAL HIGH (ref 11.5–14.5)
WBC: 14.6 10*3/uL — ABNORMAL HIGH (ref 3.8–10.6)

## 2017-10-25 LAB — PROTIME-INR
INR: 1.48
Prothrombin Time: 17.8 seconds — ABNORMAL HIGH (ref 11.4–15.2)

## 2017-10-25 SURGERY — CARDIOVERSION (CATH LAB)
Anesthesia: General

## 2017-10-25 MED ORDER — DIPHENHYDRAMINE HCL 25 MG PO CAPS
25.0000 mg | ORAL_CAPSULE | Freq: Every evening | ORAL | Status: DC | PRN
  Administered 2017-10-25: 25 mg via ORAL
  Filled 2017-10-25: qty 1

## 2017-10-25 NOTE — Progress Notes (Signed)
Maurice Peterson at [place] NAME: Maurice Peterson    MR#:  [id number]  DATE OF BIRTH:  1941-12-11  SUBJECTIVE:  Doing well. HD access clotted--status post operative repair. No new complaints  REVIEW OF SYSTEMS:   Review of Systems  Constitutional: Negative for chills, fever and weight loss.  HENT: Negative for ear discharge, ear pain and nosebleeds.   Eyes: Negative for blurred vision, pain and discharge.  Respiratory: Negative for sputum production, shortness of breath, wheezing and stridor.   Cardiovascular: Negative for chest pain, palpitations, orthopnea and PND.  Gastrointestinal: Negative for abdominal pain, diarrhea, nausea and vomiting.  Genitourinary: Negative for frequency and urgency.  Musculoskeletal: Negative for back pain and joint pain.  Skin: Positive for rash.  Neurological: Positive for weakness. Negative for sensory change, speech change and focal weakness.  Psychiatric/Behavioral: Negative for depression and hallucinations. The patient is not nervous/anxious.    Tolerating Diet:yes Tolerating PT: not needed  DRUG ALLERGIES:   Allergies  Allergen Reactions  . Ace Inhibitors Nausea And Vomiting  . Amoxicillin-Pot Clavulanate Nausea And Vomiting       . Other Nausea And Vomiting    Anti-inflammatories    VITALS:  Blood pressure 114/79, pulse 82, temperature 97.8 F (36.6 C), temperature source Oral, resp. rate 18, height 5\' 9"  (1.753 m), weight 116.3 kg (256 lb 6.4 oz), SpO2 97 %.  PHYSICAL EXAMINATION:   Physical Exam  GENERAL:  76 y.o.-year-old patient lying in the bed with no acute distress.  EYES: Pupils equal, round, reactive to light and accommodation. No scleral icterus. Extraocular muscles intact.  HEENT: Head atraumatic, normocephalic. Oropharynx and nasopharynx clear.  NECK:  Supple, no jugular venous distention. No thyroid enlargement, no tenderness.  LUNGS: Normal breath sounds bilaterally, no  wheezing, rales, rhonchi. No use of accessory muscles of respiration.  CARDIOVASCULAR: S1, S2 normal. No murmurs, rubs, or gallops.  ABDOMEN: Soft, nontender, nondistended. Bowel sounds present. No organomegaly or mass.  EXTREMITIES: No cyanosis, clubbing or edema b/l.    NEUROLOGIC: Cranial nerves II through XII are intact. No focal Motor or sensory deficits b/l.   PSYCHIATRIC:  patient is alert and oriented x 3.  SKIN: vasculitic rash+ on Hands   LABORATORY PANEL:  CBC  Recent Labs Lab 10/25/17 1344  WBC 14.6*  HGB 12.3*  HCT 38.1*  PLT 94*    Chemistries   Recent Labs Lab 10/19/17 1446  10/24/17 0928  NA 136  < > 131*  K 4.1  < > 4.8  CL 95*  < > 91*  CO2 27  < > 27  GLUCOSE 108*  < > 108*  BUN 35*  < > 96*  CREATININE 4.09*  < > 8.22*  CALCIUM 8.0*  < > 7.6*  AST 16  --   --   ALT 24  --   --   ALKPHOS 45  --   --   BILITOT 1.1  --   --   < > = values in this interval not displayed. Cardiac Enzymes  Recent Labs Lab 10/20/17 1427  TROPONINI 0.07*   RADIOLOGY:  No results found. ASSESSMENT AND PLAN:  66 y m with known history ofESRD on HD (MWF), atrial flutter on Coumadin, COPD, hypertension and hyperlipidemia being admitted for possible sepsis  * Atrial fibrillation/flutter -Patient is status post cardioversion was in sinus rhythm---.  Atrial flutter in the 90s today - resume amiodarone at400 twice a day 2 weeks and then  200 twice daily --Cardiology recommends IV heparin drip to be started back after vascular declotting and resumed Coumadin  -INR is subtherapeutic at 1.4.  Continue IV heparin drip.  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 D/w dr Ronalee Belts -Temporary dialysis catheter to be removed per Dr. Zollie Scale  Discussed with patient and wife.  Case discussed with Care Management/Social Worker. Management plans discussed with the patient, family and they are in agreement.  CODE STATUS: *full code DVT Prophylaxis: hcoumadin  TOTAL TIME TAKING CARE OF THIS PATIENT: *25* minutes.  >50% time spent on counselling and coordination of care  POSSIBLE D/C IN *1-2* DAYS, DEPENDING ON CLINICAL CONDITION.  Note: This dictation was prepared with Dragon dictation along with smaller phrase technology. Any transcriptional errors that result from this process are unintentional.  Angelicia Lessner M.D on 10/25/2017 at 2:47 PM  Between 7am to 6pm - Pager - [phone]  After 6pm go to [email or web address] - password [email or web address] [facility]  Office  [phone]  CC: Primary care physician; Madelyn Brunner, MD

## 2017-10-25 NOTE — Progress Notes (Signed)
Pt requesting benadryl for sleep, takes that at home sometimes when he can't sleep. Dr. Jannifer Franklin paged, he will put in orders.

## 2017-10-25 NOTE — Plan of Care (Signed)
Problem: Safety: Goal: Ability to remain free from injury will improve Outcome: Progressing Pt educated on safety, call bell in reach, bed in lowest position, yellow socks on.   Problem: Physical Regulation: Goal: Will remain free from infection Outcome: Progressing Temporary right groin cathether removed as it was no longer necessary.

## 2017-10-25 NOTE — Progress Notes (Signed)
ANTICOAGULATION CONSULT NOTE - Initial Consult  Pharmacy Consult for heparin Indication: AF  Allergies  Allergen Reactions  . Ace Inhibitors Nausea And Vomiting  . Amoxicillin-Pot Clavulanate Nausea And Vomiting       . Other Nausea And Vomiting    Anti-inflammatories    Patient Measurements: Height: 5\' 9"  (175.3 cm) Weight: 256 lb 6.4 oz (116.3 kg) IBW/kg (Calculated) : 70.7 Heparin Dosing Weight: 96.6 kg  Vital Signs: Temp: 97.8 F (36.6 C) (10/30 0841) Temp Source: Oral (10/30 0841) BP: 114/79 (10/30 0841) Pulse Rate: 82 (10/30 0841)  Labs:  Recent Labs  10/23/17 0414 10/24/17 0422 10/24/17 0928 10/24/17 1732 10/25/17 0118 10/25/17 1344  HGB  --  11.3*  --   --   --  12.3*  HCT  --  33.6*  --   --   --  38.1*  PLT  --  108*  --   --   --  94*  APTT  --   --   --  32  --   --   LABPROT 28.0* 22.7*  --   --  17.8*  --   INR 2.64 2.02  --   --  1.48  --   HEPARINUNFRC  --   --   --   --  0.40 0.86*  CREATININE  --   --  8.22*  --   --   --     Estimated Creatinine Clearance: 9.8 mL/min (A) (by C-G formula based on SCr of 8.22 mg/dL (H)).   Medical History: Past Medical History:  Diagnosis Date  . Arthritis   . Atrial flutter ([place])    a. s/p DCCV 07/2017 briefly successful; b. amio and warfarin; c. CHADS2VASc => 5 (CHF, HTN, age x 2, vascular disease);  [name] CAD (coronary artery disease)    a. remote inferior MI s/p stenting; b. Myoview in 10/2016 with normal perfusion with an EF of 46%  . COPD (chronic obstructive pulmonary disease) ([place])   . ESRD (end stage renal disease) on dialysis ([place])    a. HD MWF  . GERD (gastroesophageal reflux disease)   . Hyperlipidemia   . Hypertension   . Ischemic cardiomyopathy    a. TTE 05/2017: EF 35-40%, unable to exlcude RWMA, mild MR, mildly dilated RV  . Prostate cancer ([place])   . Shortness of breath dyspnea     Medications:  Infusions:  . sodium chloride    . sodium chloride 10 mL/hr at 10/24/17 1503  . heparin  1,500 Units/hr (10/25/17 0706)    Assessment: 36 yom now post declotting catheter. Cardiology following for AF - patient on VKA PTA. Received vitamin K recently for procedure, pharmacy consulted to manage UFH for bridging.   Goal of Therapy:  Heparin level 0.3-0.7 units/ml Monitor platelets by anticoagulation protocol: Yes   Plan: Heparin level elevated at 0.86. Decrease rate to 1300 units/hr and recheck in 6 hours  Trevyn Lumpkin D Tracker Mance, Pharm.D., BCPS Clinical Pharmacist 10/25/2017,2:15 PM

## 2017-10-25 NOTE — Progress Notes (Signed)
[place], [name] 10/25/17  Subjective:  Patient seen at bedside.  He remains on heparin drip. Due for dialysis again tomorrow.   Objective:  Vital signs in last 24 hours:  Temp:  [97.4 F (36.3 C)-98.1 F (36.7 C)] 97.8 F (36.6 C) (10/30 0841) Pulse Rate:  [63-102] 82 (10/30 0841) Resp:  [17-24] 18 (10/30 0841) BP: (91-116)/(65-82) 114/79 (10/30 0841) SpO2:  [88 %-100 %] 97 % (10/30 0841) Weight:  [115.9 kg (255 lb 8.2 oz)-116.3 kg (256 lb 6.4 oz)] 116.3 kg (256 lb 6.4 oz) (10/30 0429)  Weight change: 0.007 kg (0.3 oz) Filed Weights   10/24/17 1135 10/24/17 1436 10/25/17 0429  Weight: 116.4 kg (256 lb 9.9 oz) 115.9 kg (255 lb 8.2 oz) 116.3 kg (256 lb 6.4 oz)    Intake/Output:    Intake/Output Summary (Last 24 hours) at 10/25/17 1418 Last data filed at 10/25/17 0604  Gross per 24 hour  Intake            716.5 ml  Output             1148 ml  Net           -431.5 ml     Physical Exam: General: NAD  HEENT Anicteric, moist mucus membranes  Neck Supple, no JVD  Pulm/lungs Clear to auscultation bilateral   CVS/Heart Irregular, no rubs  Abdomen:  Soft, NTND, BS present  Extremities: + dependent edema  Neurologic: Alert, oriented  Skin: Petechial rash over legs  Access: Left forearm AVF       Basic Metabolic Panel:   Recent Labs Lab 10/19/17 1446 10/20/17 0206 10/21/17 0344 10/24/17 0928  NA 136 136 131* 131*  K 4.1 5.2* 5.7* 4.8  CL 95* 93* 92* 91*  CO2 27 27 25 27   GLUCOSE 108* 151* 102* 108*  BUN 35* 49* 91* 96*  CREATININE 4.09* 5.59* 7.31* 8.22*  CALCIUM 8.0* 7.9* 8.1* 7.6*  PHOS  --   --  11.1* 9.8*     CBC:  Recent Labs Lab 10/19/17 1446 10/20/17 0206 10/21/17 0344 10/24/17 0422 10/25/17 1344  WBC 20.4* 19.7* 21.2* 14.4* 14.6*  NEUTROABS 18.2*  --  18.6*  --   --   HGB 12.4* 12.1* 11.7* 11.3* 12.3*  HCT 39.1* 37.7* 37.3* 33.6* 38.1*  MCV 93.9 94.5 94.5 92.2 94.3  PLT 186 113* 126* 108* 94*     No  results found for: HEPBSAG, HEPBSAB, HEPBIGM    Microbiology:  Recent Results (from the past 240 hour(s))  Blood culture (routine x 2)     Status: None   Collection Time: 10/19/17  4:30 PM  Result Value Ref Range Status   Specimen Description BLOOD RIGHT ARM  Final   Special Requests   Final    BOTTLES DRAWN AEROBIC AND ANAEROBIC Blood Culture adequate volume   Culture NO GROWTH 5 DAYS  Final   Report Status 10/24/2017 FINAL  Final  Blood culture (routine x 2)     Status: None   Collection Time: 10/19/17  7:37 PM  Result Value Ref Range Status   Specimen Description BLOOD RIGHT SHOULDER  Final   Special Requests   Final    BOTTLES DRAWN AEROBIC AND ANAEROBIC Blood Culture adequate volume   Culture NO GROWTH 5 DAYS  Final   Report Status 10/24/2017 FINAL  Final  MRSA PCR Screening     Status: None   Collection Time: 10/19/17  7:38 PM  Result Value Ref  Range Status   MRSA by PCR NEGATIVE NEGATIVE Final    Comment:        The GeneXpert MRSA Assay (FDA approved for NASAL specimens only), is one component of a comprehensive MRSA colonization surveillance program. It is not intended to diagnose MRSA infection nor to guide or monitor treatment for MRSA infections.     Coagulation Studies:  Recent Labs  10/23/17 0414 10/24/17 0422 10/25/17 0118  LABPROT 28.0* 22.7* 17.8*  INR 2.64 2.02 1.48    Urinalysis: No results for input(s): COLORURINE, LABSPEC, PHURINE, GLUCOSEU, HGBUR, BILIRUBINUR, KETONESUR, PROTEINUR, UROBILINOGEN, NITRITE, LEUKOCYTESUR in the last 72 hours.  Invalid input(s): APPERANCEUR    Imaging: No results found.   Medications:   . sodium chloride    . sodium chloride 10 mL/hr at 10/24/17 1503  . heparin 1,500 Units/hr (10/25/17 0706)   . amiodarone  400 mg Oral BID  . atorvastatin  10 mg Oral QPM  . calcium acetate  2,001 mg Oral TID WC  . cholecalciferol  2,000 Units Oral Daily  . docusate sodium  100 mg Oral BID  . febuxostat  40 mg  Oral QPM  . levalbuterol  1.25 mg Nebulization Once  . montelukast  10 mg Oral QPM  . multivitamin  1 tablet Oral Daily  . nystatin  5 mL Oral QID  . pantoprazole  40 mg Oral Daily  . predniSONE  20 mg Oral Q breakfast  . sodium chloride flush  3 mL Intravenous Q12H  . umeclidinium-vilanterol  1 puff Inhalation Daily  . warfarin  3 mg Oral q1800  . Warfarin - Pharmacist Dosing Inpatient   Does not apply q1800   acetaminophen, albuterol, bisacodyl, guaiFENesin **AND** dextromethorphan, HYDROcodone-acetaminophen, ondansetron **OR** ondansetron (ZOFRAN) IV, sodium chloride flush  Assessment/ Plan:  76 y.o. 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  1. End-stage renal disease - patient had fistulogram yesterday.  PTCA of the cephalic vein was performed.  We will plan for dialysis tomorrow using his left upper extremity AV fistula.  2.  Anemia of chronic kidney disease - hemoglobin currently 12.3.  Continue to monitor.  3.  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  4.  Secondary hyperparathyroidism Repeat serum phosphorus tomorrow as was quite high yesterday at 9.8.  Continue PhosLo 3 tablets by mouth 3 times a day with meals.  5.  Hypotension from atrial flutter Patient currently on heparin drip.     LOS: 6 Shanee Batch 10/30/20182:18 PM  [place], [place]

## 2017-10-25 NOTE — Progress Notes (Signed)
ANTICOAGULATION CONSULT NOTE - Initial Consult  Pharmacy Consult for heparin Indication: AF  Allergies  Allergen Reactions  . Ace Inhibitors Nausea And Vomiting  . Amoxicillin-Pot Clavulanate Nausea And Vomiting       . Other Nausea And Vomiting    Anti-inflammatories    Patient Measurements: Height: 5\' 9"  (175.3 cm) Weight: 256 lb 6.4 oz (116.3 kg) IBW/kg (Calculated) : 70.7 Heparin Dosing Weight: 96.6 kg  Vital Signs: Temp: 97.5 F (36.4 C) (10/30 1939) Temp Source: Oral (10/30 1939) BP: 112/68 (10/30 1939) Pulse Rate: 101 (10/30 1939)  Labs:  Recent Labs  10/23/17 0414 10/24/17 0422 10/24/17 0928 10/24/17 1732 10/25/17 0118 10/25/17 1344 10/25/17 2228  HGB  --  11.3*  --   --   --  12.3*  --   HCT  --  33.6*  --   --   --  38.1*  --   PLT  --  108*  --   --   --  94*  --   APTT  --   --   --  32  --   --   --   LABPROT 28.0* 22.7*  --   --  17.8*  --   --   INR 2.64 2.02  --   --  1.48  --   --   HEPARINUNFRC  --   --   --   --  0.40 0.86* 0.24*  CREATININE  --   --  8.22*  --   --   --   --     Estimated Creatinine Clearance: 9.8 mL/min (A) (by C-G formula based on SCr of 8.22 mg/dL (H)).   Medical History: Past Medical History:  Diagnosis Date  . Arthritis   . Atrial flutter ([place])    a. Peterson/p DCCV 07/2017 briefly successful; b. amio and warfarin; c. CHADS2VASc => 5 (CHF, HTN, age x 2, vascular disease);  [name] CAD (coronary artery disease)    a. remote inferior MI Peterson/p stenting; b. Myoview in 10/2016 with normal perfusion with an EF of 46%  . COPD (chronic obstructive pulmonary disease) ([place])   . ESRD (end stage renal disease) on dialysis ([place])    a. HD MWF  . GERD (gastroesophageal reflux disease)   . Hyperlipidemia   . Hypertension   . Ischemic cardiomyopathy    a. TTE 05/2017: EF 35-40%, unable to exlcude RWMA, mild MR, mildly dilated RV  . Prostate cancer ([place])   . Shortness of breath dyspnea     Medications:  Infusions:  . sodium chloride     . sodium chloride 10 mL/hr at 10/24/17 1503  . heparin 1,300 Units/hr (10/25/17 1418)    Assessment: 44 yom now post declotting catheter. Cardiology following for AF - patient on VKA PTA. Received vitamin K recently for procedure, pharmacy consulted to manage UFH for bridging.   Goal of Therapy:  Heparin level 0.3-0.7 units/ml Monitor platelets by anticoagulation protocol: Yes   Plan: Heparin level elevated at 0.86. Decrease rate to 1300 units/hr and recheck in 6 hours  10/30 2230 heparin level 0.24. Increase to 1400 units/hr with no bolus. Increasing cautiously due to large decrease in heparin level between previous rate changes.  Maurice Peterson, Pharm.D., BCPS Clinical Pharmacist 10/25/2017,11:36 PM

## 2017-10-25 NOTE — Progress Notes (Signed)
ANTICOAGULATION CONSULT NOTE - Initial Consult  Pharmacy Consult for heparin Indication: AF  Allergies  Allergen Reactions  . Ace Inhibitors Nausea And Vomiting  . Amoxicillin-Pot Clavulanate Nausea And Vomiting       . Other Nausea And Vomiting    Anti-inflammatories    Patient Measurements: Height: 5\' 9"  (175.3 cm) Weight: 256 lb 6.4 oz (116.3 kg) IBW/kg (Calculated) : 70.7 Heparin Dosing Weight: 96.6 kg  Vital Signs: Temp: 97.4 F (36.3 C) (10/30 0429) Temp Source: Oral (10/30 0429) BP: 116/71 (10/30 0429) Pulse Rate: 83 (10/30 0429)  Labs:  Recent Labs  10/23/17 0414 10/24/17 0422 10/24/17 0928 10/24/17 1732 10/25/17 0118  HGB  --  11.3*  --   --   --   HCT  --  33.6*  --   --   --   PLT  --  108*  --   --   --   APTT  --   --   --  32  --   LABPROT 28.0* 22.7*  --   --  17.8*  INR 2.64 2.02  --   --  1.48  HEPARINUNFRC  --   --   --   --  0.40  CREATININE  --   --  8.22*  --   --     Estimated Creatinine Clearance: 9.8 mL/min (A) (by C-G formula based on SCr of 8.22 mg/dL (H)).   Medical History: Past Medical History:  Diagnosis Date  . Arthritis   . Atrial flutter ([place])    a. s/p DCCV 07/2017 briefly successful; b. amio and warfarin; c. CHADS2VASc => 5 (CHF, HTN, age x 2, vascular disease);  [name] CAD (coronary artery disease)    a. remote inferior MI s/p stenting; b. Myoview in 10/2016 with normal perfusion with an EF of 46%  . COPD (chronic obstructive pulmonary disease) ([place])   . ESRD (end stage renal disease) on dialysis ([place])    a. HD MWF  . GERD (gastroesophageal reflux disease)   . Hyperlipidemia   . Hypertension   . Ischemic cardiomyopathy    a. TTE 05/2017: EF 35-40%, unable to exlcude RWMA, mild MR, mildly dilated RV  . Prostate cancer ([place])   . Shortness of breath dyspnea     Medications:  Infusions:  . sodium chloride    . sodium chloride 10 mL/hr at 10/24/17 1503  . heparin 1,500 Units/hr (10/25/17 0706)    Assessment: 12 yom  now post declotting catheter. Cardiology following for AF - patient on VKA PTA. Received vitamin K recently for procedure, pharmacy consulted to manage UFH for bridging.   Goal of Therapy:  Heparin level 0.3-0.7 units/ml Monitor platelets by anticoagulation protocol: Yes   Plan: Per RN pt peripheral IV site went bad last night and IV was pulled around 2000. Pt had intermittent bleeding. Heparin drip was held for 30 min at 0630 and was resumed at 0700. Will check HL 6 hours from the restart of the drip, 1300.  Ramond Dial, Pharm.D., BCPS Clinical Pharmacist 10/25/2017,7:44 AM

## 2017-10-25 NOTE — Progress Notes (Signed)
ANTICOAGULATION CONSULT NOTE - Initial Consult  Pharmacy Consult for heparin Indication: AF  Allergies  Allergen Reactions  . Ace Inhibitors Nausea And Vomiting  . Amoxicillin-Pot Clavulanate Nausea And Vomiting       . Other Nausea And Vomiting    Anti-inflammatories    Patient Measurements: Height: 5\' 9"  (175.3 cm) Weight: 255 lb 8.2 oz (115.9 kg) IBW/kg (Calculated) : 70.7 Heparin Dosing Weight: 96.6 kg  Vital Signs: Temp: 98.1 F (36.7 C) (10/29 1913) Temp Source: Oral (10/29 1913) BP: 102/71 (10/29 1913) Pulse Rate: 92 (10/29 1913)  Labs:  Recent Labs  10/23/17 0414 10/24/17 0422 10/24/17 0928 10/24/17 1732 10/25/17 0118  HGB  --  11.3*  --   --   --   HCT  --  33.6*  --   --   --   PLT  --  108*  --   --   --   APTT  --   --   --  32  --   LABPROT 28.0* 22.7*  --   --  17.8*  INR 2.64 2.02  --   --  1.48  HEPARINUNFRC  --   --   --   --  0.40  CREATININE  --   --  8.22*  --   --     Estimated Creatinine Clearance: 9.8 mL/min (A) (by C-G formula based on SCr of 8.22 mg/dL (H)).   Medical History: Past Medical History:  Diagnosis Date  . Arthritis   . Atrial flutter ([place])    a. s/p DCCV 07/2017 briefly successful; b. amio and warfarin; c. CHADS2VASc => 5 (CHF, HTN, age x 2, vascular disease);  [name] CAD (coronary artery disease)    a. remote inferior MI s/p stenting; b. Myoview in 10/2016 with normal perfusion with an EF of 46%  . COPD (chronic obstructive pulmonary disease) ([place])   . ESRD (end stage renal disease) on dialysis ([place])    a. HD MWF  . GERD (gastroesophageal reflux disease)   . Hyperlipidemia   . Hypertension   . Ischemic cardiomyopathy    a. TTE 05/2017: EF 35-40%, unable to exlcude RWMA, mild MR, mildly dilated RV  . Prostate cancer ([place])   . Shortness of breath dyspnea     Medications:  Infusions:  . sodium chloride    . sodium chloride 10 mL/hr at 10/24/17 1503  . heparin 1,500 Units/hr (10/24/17 2142)    Assessment: 50 yom  now post declotting catheter. Cardiology following for AF - patient on VKA PTA. Received vitamin K recently for procedure, pharmacy consulted to manage UFH for bridging.   Goal of Therapy:  Heparin level 0.3-0.7 units/ml Monitor platelets by anticoagulation protocol: Yes   Plan: Maurice Peterson was therapeutic on heparin 1500 units/hr in June of this year, so we will start at that rate (no bolus per order). I spoke with Dr. Lucky Cowboy regarding initiation times post procedure and he tells me patient may start on UFH/VKA now as far as he is concerned.   Start heparin infusion at 1500 units/hr Check anti-Xa level in 8 hours and daily while on heparin Continue to monitor H&H and platelets   10/30 0100 heparin level 0.40. Continue current regimen and recheck in 8 hours to confirm. CBC ordered with confirmatory level.  Ingri Diemer S, Pharm.D., BCPS Clinical Pharmacist 10/25/2017,3:14 AM

## 2017-10-25 NOTE — Progress Notes (Signed)
ANTICOAGULATION CONSULT NOTE - Consult  Pharmacy Consult for warfarin Indication: atrial fibrillation  Allergies  Allergen Reactions  . Ace Inhibitors Nausea And Vomiting  . Amoxicillin-Pot Clavulanate Nausea And Vomiting       . Other Nausea And Vomiting    Anti-inflammatories    Patient Measurements: Height: 5\' 9"  (175.3 cm) Weight: 256 lb 6.4 oz (116.3 kg) IBW/kg (Calculated) : 70.7  Vital Signs: Temp: 97.4 F (36.3 C) (10/30 0429) Temp Source: Oral (10/30 0429) BP: 116/71 (10/30 0429) Pulse Rate: 83 (10/30 0429)  Labs:  Recent Labs  10/23/17 0414 10/24/17 0422 10/24/17 0928 10/24/17 1732 10/25/17 0118  HGB  --  11.3*  --   --   --   HCT  --  33.6*  --   --   --   PLT  --  108*  --   --   --   APTT  --   --   --  32  --   LABPROT 28.0* 22.7*  --   --  17.8*  INR 2.64 2.02  --   --  1.48  HEPARINUNFRC  --   --   --   --  0.40  CREATININE  --   --  8.22*  --   --     Estimated Creatinine Clearance: 9.8 mL/min (A) (by C-G formula based on SCr of 8.22 mg/dL (H)).   Medical History: Past Medical History:  Diagnosis Date  . Arthritis   . Atrial flutter ([place])    a. s/p DCCV 07/2017 briefly successful; b. amio and warfarin; c. CHADS2VASc => 5 (CHF, HTN, age x 2, vascular disease);  [name] CAD (coronary artery disease)    a. remote inferior MI s/p stenting; b. Myoview in 10/2016 with normal perfusion with an EF of 46%  . COPD (chronic obstructive pulmonary disease) ([place])   . ESRD (end stage renal disease) on dialysis ([place])    a. HD MWF  . GERD (gastroesophageal reflux disease)   . Hyperlipidemia   . Hypertension   . Ischemic cardiomyopathy    a. TTE 05/2017: EF 35-40%, unable to exlcude RWMA, mild MR, mildly dilated RV  . Prostate cancer ([place])   . Shortness of breath dyspnea    Assessment: Patient admitted for CP/SOB was found to be septic. Is anticoagulated w/ warfarin for chronic afib.  Warfarin PTA: 3 mg daily at 6 pm.  DATE INR  Dose  10/23    3.2 10/24   2.57 --- 10/25 2.24 3mg  10/26 2.17     3 mg  10/27   2.62 Held by MD 10/28 2.64 Hold per MD 10/29  2.02 3 mg    Goal of Therapy:  INR 2-3 Monitor platelets by anticoagulation protocol: Yes   Plan:  Warfarin resume last night after 2 days of holding and vit K 2.5mg . INR is subtherapeutic as to be expected. Continue patient home dose of 3mg  daily and recheck INR in the AM. Pt to be on heparin drip to bridge while INR is subtherapeutic.   Ramond Dial, PharmD, BCPS Clinical Pharmacist 10/25/2017

## 2017-10-25 NOTE — Progress Notes (Signed)
Pt had a peripheral IV site go bad last night, IV was pulled around 2000, pt has been bleeding intermittently since then, with multiple pressure dressings. Pt is currently on a heparin gtt to bridge pt back to his coumadin. MD paged, verbal orders from Dr. Estanislado Pandy to hold heparin gtt for 30 minutes, to restart back at 0700. Pressure dressing currently back in place, will continue to monitor.

## 2017-10-26 DIAGNOSIS — A419 Sepsis, unspecified organism: Secondary | ICD-10-CM

## 2017-10-26 LAB — CBC
HEMATOCRIT: 35.2 % — AB (ref 40.0–52.0)
HEMATOCRIT: 34.8 % — AB (ref 40.0–52.0)
HEMOGLOBIN: 11.4 g/dL — AB (ref 13.0–18.0)
HEMOGLOBIN: 11 g/dL — AB (ref 13.0–18.0)
MCH: 30.2 pg (ref 26.0–34.0)
MCH: 29.5 pg (ref 26.0–34.0)
MCHC: 32.4 g/dL (ref 32.0–36.0)
MCHC: 31.6 g/dL — AB (ref 32.0–36.0)
MCV: 93.4 fL (ref 80.0–100.0)
MCV: 93.4 fL (ref 80.0–100.0)
Platelets: 123 10*3/uL — ABNORMAL LOW (ref 150–440)
Platelets: 128 10*3/uL — ABNORMAL LOW (ref 150–440)
RBC: 3.77 MIL/uL — AB (ref 4.40–5.90)
RBC: 3.73 MIL/uL — ABNORMAL LOW (ref 4.40–5.90)
RDW: 17.4 % — ABNORMAL HIGH (ref 11.5–14.5)
RDW: 17.3 % — AB (ref 11.5–14.5)
WBC: 16.6 10*3/uL — ABNORMAL HIGH (ref 3.8–10.6)
WBC: 16.7 10*3/uL — ABNORMAL HIGH (ref 3.8–10.6)

## 2017-10-26 LAB — PHOSPHORUS: PHOSPHORUS: 8.2 mg/dL — AB (ref 2.5–4.6)

## 2017-10-26 LAB — RENAL FUNCTION PANEL
ANION GAP: 15 (ref 5–15)
Albumin: 3.4 g/dL — ABNORMAL LOW (ref 3.5–5.0)
BUN: 86 mg/dL — AB (ref 6–20)
CHLORIDE: 93 mmol/L — AB (ref 101–111)
CO2: 23 mmol/L (ref 22–32)
Calcium: 8.3 mg/dL — ABNORMAL LOW (ref 8.9–10.3)
Creatinine, Ser: 7.48 mg/dL — ABNORMAL HIGH (ref 0.61–1.24)
GFR calc Af Amer: 7 mL/min — ABNORMAL LOW (ref >60)
GFR calc non Af Amer: 6 mL/min — ABNORMAL LOW (ref >60)
GLUCOSE: 92 mg/dL (ref 65–99)
POTASSIUM: 5.3 mmol/L — AB (ref 3.5–5.1)
Phosphorus: 8.2 mg/dL — ABNORMAL HIGH (ref 2.5–4.6)
Sodium: 131 mmol/L — ABNORMAL LOW (ref 135–145)

## 2017-10-26 LAB — GLUCOSE, CAPILLARY: GLUCOSE-CAPILLARY: 87 mg/dL (ref 65–99)

## 2017-10-26 LAB — PROTIME-INR
INR: 1.44
Prothrombin Time: 17.4 seconds — ABNORMAL HIGH (ref 11.4–15.2)

## 2017-10-26 LAB — HEPARIN LEVEL (UNFRACTIONATED): Heparin Unfractionated: 0.33 IU/mL (ref 0.30–0.70)

## 2017-10-26 MED ORDER — APIXABAN 5 MG PO TABS
5.0000 mg | ORAL_TABLET | Freq: Two times a day (BID) | ORAL | Status: DC
  Filled 2017-10-26: qty 1

## 2017-10-26 MED ORDER — APIXABAN 5 MG PO TABS: 5.0000 mg | ORAL_TABLET | Freq: Two times a day (BID) | ORAL | 2 refills | Status: AC

## 2017-10-26 NOTE — Progress Notes (Signed)
[facility]- [place], [name] 10/26/17  Subjective:  Patient seen and evaluated during hemodialysis. He appears to be tolerating this quite well. His left upper extremity AV fistula is working well.   Objective:  Vital signs in last 24 hours:  Temp:  [97.4 F (36.3 C)-98.3 F (36.8 C)] 98 F (36.7 C) (10/31 0945) Pulse Rate:  [82-101] 101 (10/31 1200) Resp:  [17-21] 19 (10/31 1200) BP: (93-118)/(67-81) 105/67 (10/31 1200) SpO2:  [94 %-99 %] 97 % (10/31 1200) Weight:  [117.3 kg (258 lb 9.6 oz)-121.8 kg (268 lb 8.3 oz)] 121.8 kg (268 lb 8.3 oz) (10/31 0945)  Weight change: 0.9 kg (1 lb 15.8 oz) Filed Weights   10/25/17 0429 10/26/17 0339 10/26/17 0945  Weight: 116.3 kg (256 lb 6.4 oz) 117.3 kg (258 lb 9.6 oz) 121.8 kg (268 lb 8.3 oz)    Intake/Output:    Intake/Output Summary (Last 24 hours) at 10/26/17 1210 Last data filed at 10/26/17 1010  Gross per 24 hour  Intake          1046.83 ml  Output                0 ml  Net          1046.83 ml     Physical Exam: General: NAD  HEENT Anicteric, moist mucus membranes  Neck Supple  Pulm/lungs Clear to auscultation bilateral   CVS/Heart Irregular, no rubs  Abdomen:  Soft, NTND, BS present  Extremities: + dependent edema  Neurologic: Alert, oriented  Skin: Petechial rash over legs  Access: Left forearm AVF       Basic Metabolic Panel:   Recent Labs Lab 10/19/17 1446 10/20/17 0206 10/21/17 0344 10/24/17 0928 10/26/17 0823 10/26/17 1018  NA 136 136 131* 131*  --  131*  K 4.1 5.2* 5.7* 4.8  --  5.3*  CL 95* 93* 92* 91*  --  93*  CO2 27 27 25 27   --  23  GLUCOSE 108* 151* 102* 108*  --  92  BUN 35* 49* 91* 96*  --  86*  CREATININE 4.09* 5.59* 7.31* 8.22*  --  7.48*  CALCIUM 8.0* 7.9* 8.1* 7.6*  --  8.3*  PHOS  --   --  11.1* 9.8* 8.2* 8.2*     CBC:  Recent Labs Lab 10/19/17 1446  10/21/17 0344 10/24/17 0422 10/25/17 1344 10/26/17 0823 10/26/17 1018  WBC 20.4*  < > 21.2* 14.4*  14.6* 16.6* 16.7*  NEUTROABS 18.2*  --  18.6*  --   --   --   --   HGB 12.4*  < > 11.7* 11.3* 12.3* 11.4* 11.0*  HCT 39.1*  < > 37.3* 33.6* 38.1* 35.2* 34.8*  MCV 93.9  < > 94.5 92.2 94.3 93.4 93.4  PLT 186  < > 126* 108* 94* 123* 128*  < > = values in this interval not displayed.   No results found for: HEPBSAG, HEPBSAB, HEPBIGM    Microbiology:  Recent Results (from the past 240 hour(s))  Blood culture (routine x 2)     Status: None   Collection Time: 10/19/17  4:30 PM  Result Value Ref Range Status   Specimen Description BLOOD RIGHT ARM  Final   Special Requests   Final    BOTTLES DRAWN AEROBIC AND ANAEROBIC Blood Culture adequate volume   Culture NO GROWTH 5 DAYS  Final   Report Status 10/24/2017 FINAL  Final  Blood culture (routine x 2)  Status: None   Collection Time: 10/19/17  7:37 PM  Result Value Ref Range Status   Specimen Description BLOOD RIGHT SHOULDER  Final   Special Requests   Final    BOTTLES DRAWN AEROBIC AND ANAEROBIC Blood Culture adequate volume   Culture NO GROWTH 5 DAYS  Final   Report Status 10/24/2017 FINAL  Final  MRSA PCR Screening     Status: None   Collection Time: 10/19/17  7:38 PM  Result Value Ref Range Status   MRSA by PCR NEGATIVE NEGATIVE Final    Comment:        The GeneXpert MRSA Assay (FDA approved for NASAL specimens only), is one component of a comprehensive MRSA colonization surveillance program. It is not intended to diagnose MRSA infection nor to guide or monitor treatment for MRSA infections.     Coagulation Studies:  Recent Labs  10/24/17 0422 10/25/17 0118 10/26/17 0823  LABPROT 22.7* 17.8* 17.4*  INR 2.02 1.48 1.44    Urinalysis: No results for input(s): COLORURINE, LABSPEC, PHURINE, GLUCOSEU, HGBUR, BILIRUBINUR, KETONESUR, PROTEINUR, UROBILINOGEN, NITRITE, LEUKOCYTESUR in the last 72 hours.  Invalid input(s): APPERANCEUR    Imaging: No results found.   Medications:    . amiodarone  400 mg Oral  BID  . apixaban  5 mg Oral BID  . atorvastatin  10 mg Oral QPM  . calcium acetate  2,001 mg Oral TID WC  . cholecalciferol  2,000 Units Oral Daily  . docusate sodium  100 mg Oral BID  . febuxostat  40 mg Oral QPM  . levalbuterol  1.25 mg Nebulization Once  . montelukast  10 mg Oral QPM  . multivitamin  1 tablet Oral Daily  . nystatin  5 mL Oral QID  . pantoprazole  40 mg Oral Daily  . predniSONE  20 mg Oral Q breakfast  . umeclidinium-vilanterol  1 puff Inhalation Daily   acetaminophen, albuterol, bisacodyl, guaiFENesin **AND** dextromethorphan, diphenhydrAMINE, HYDROcodone-acetaminophen, ondansetron **OR** ondansetron (ZOFRAN) IV  Assessment/ Plan:  76 y.o. 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  1. End-stage renal disease - The patient's fistulogram went well. His access is now functioning quite well. He is seen and evaluated during hemodialysis. We plan to complete dialysis today.  2.  Anemia of chronic kidney disease - Hemoglobin 11.0. Hold off on Epogen for now.  3.  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  4.  Secondary hyperparathyroidism Phosphorus repeated today and still quite high at 8.2 despite increase in PhosLo. He may need to have additional Binder therapy added as an outpatient.  5.  Hypotension from atrial flutter Patient currently on heparin drip, INR currently 1.44. Pharmacy managing.     LOS: 7 [name] 10/31/201812:10 PM  [place], [place]

## 2017-10-26 NOTE — Care Management (Signed)
Patient's wife confirms that patient was taken off Eliquis and placed on coumadin by "the heart doctor" because in patients with kidney trouble should take coumadin."  Patient will follow up with [facility] cardiology at discharge.  Wife says that patient does have some eliquis on hand and verbally confirms that patient has pharmacy coverage with his medicare plan.  Discussed the usual tier level and copays.  Patient has not been walking much per wife since admitted.  Discussed need to ambulate with primary nurse.  Patient does have a walker at home.  Wife will think about home health nurse.

## 2017-10-26 NOTE — Progress Notes (Signed)
Walked patient out to the nurses station. Pt on room air, tolerated well with no complaints. Pt stated he walked the amount he usually walks at home. No complaints of dizziness. Will continue to monitor.

## 2017-10-26 NOTE — Progress Notes (Signed)
Progress Note  Patient Name: Maurice Peterson Date of Encounter: 10/26/2017  Primary Cardiologist: Dr. Caryl Comes (EP)  Subjective   Underwent successful DCCV on 10/26.  back in Afib/flutter October 29  HD access clotted off, underwent temporary dialysis catheter on 10/27 with successful completion of HD.   vascular angiogram on 10/29.  Coumadin has been on hold since 10/26 but has been on heparin INR today 1.4, subtherapeutic Discussed with the patient and with hospitalist service, consideration of starting a NOAC   Inpatient Medications    Scheduled Meds: . amiodarone  400 mg Oral BID  . atorvastatin  10 mg Oral QPM  . calcium acetate  1,334 mg Oral TID WC  . cholecalciferol  2,000 Units Oral Daily  . docusate sodium  100 mg Oral BID  . febuxostat  40 mg Oral QPM  . levalbuterol  1.25 mg Nebulization Once  . montelukast  10 mg Oral QPM  . multivitamin  1 tablet Oral Daily  . nystatin  5 mL Oral QID  . pantoprazole  40 mg Oral Daily  . predniSONE  20 mg Oral Q breakfast  . sodium chloride flush  3 mL Intravenous Q12H  . umeclidinium-vilanterol  1 puff Inhalation Daily  . Warfarin - Pharmacist Dosing Inpatient   Does not apply q1800   Continuous Infusions: . sodium chloride    . clindamycin (CLEOCIN) IV     PRN Meds: acetaminophen, albuterol, bisacodyl, guaiFENesin **AND** dextromethorphan, HYDROcodone-acetaminophen, ketorolac, ondansetron **OR** ondansetron (ZOFRAN) IV, sodium chloride flush      Vital Signs    Vitals:   10/26/17 1315 10/26/17 1325 10/26/17 1333 10/26/17 1355  BP: 100/77 100/62 91/72 92/67   Pulse: (!) 102 99 99   Resp: 20 20 20    Temp:    98 F (36.7 C)  TempSrc:      SpO2: 98% 98% 98%   Weight:    270 lb 11.6 oz (122.8 kg)  Height:        Intake/Output Summary (Last 24 hours) at 10/26/17 1918 Last data filed at 10/26/17 1355  Gross per 24 hour  Intake           637.95 ml  Output             2036 ml  Net         -1398.05 ml   Filed  Weights   10/26/17 0339 10/26/17 0945 10/26/17 1355  Weight: 258 lb 9.6 oz (117.3 kg) 268 lb 8.3 oz (121.8 kg) 270 lb 11.6 oz (122.8 kg)    Telemetry    Afib/flutter with variable AV block, 80-90 bpm - Personally Reviewed  ECG      Physical Exam   Examined at hemodialysis No significant change in exam GEN: No acute distress.   Neck: No JVD. Cardiac: Irregular, no murmurs, rubs, or gallops.  Respiratory: Clear to auscultation bilaterally.  GI: Soft, nontender, non-distended.   MS: No edema; No deformity. Neuro:  Alert and oriented x 3; Nonfocal.  Psych: Normal affect.  Labs    Chemistry  Recent Labs Lab 10/21/17 0344 10/24/17 0928 10/26/17 1018  NA 131* 131* 131*  K 5.7* 4.8 5.3*  CL 92* 91* 93*  CO2 25 27 23   GLUCOSE 102* 108* 92  BUN 91* 96* 86*  CREATININE 7.31* 8.22* 7.48*  CALCIUM 8.1* 7.6* 8.3*  ALBUMIN  --  3.1* 3.4*  GFRNONAA 6* 6* 6*  GFRAA 7* 6* 7*  ANIONGAP 14 13 15  Hematology  Recent Labs Lab 10/25/17 1344 10/26/17 0823 10/26/17 1018  WBC 14.6* 16.6* 16.7*  RBC 4.04* 3.77* 3.73*  HGB 12.3* 11.4* 11.0*  HCT 38.1* 35.2* 34.8*  MCV 94.3 93.4 93.4  MCH 30.4 30.2 29.5  MCHC 32.2 32.4 31.6*  RDW 17.8* 17.4* 17.3*  PLT 94* 123* 128*    Cardiac Enzymes  Recent Labs Lab 10/19/17 1937 10/20/17 0206 10/20/17 0758 10/20/17 1427  TROPONINI 0.05* 0.05* 0.05* 0.07*   No results for input(s): TROPIPOC in the last 168 hours.   BNPNo results for input(s): BNP, PROBNP in the last 168 hours.   DDimer No results for input(s): DDIMER in the last 168 hours.   Radiology    No results found.  Cardiac Studies   Echo 05/2017: Study Conclusions  - Left ventricle: The cavity size was normal. Wall thickness was   normal. Systolic function was moderately reduced. The estimated   ejection fraction was in the range of 35% to 40%. Regional wall   motion abnormalities cannot be excluded. - Mitral valve: There was mild regurgitation. - Right  ventricle: The cavity size was mildly dilated. Wall   thickness was normal.  Impressions:  - Normal study. Mild to moderate reduced overall left ventricular   function   Globally depressed LVF   ejection fraction between 35 and 40%   Mildly dilated right side  DCCV 10/21/2017: A standard informed consent was obtained. Timeout was performed. The pads were placed in the anterior posterior fashion. The patient was given propofol by the anesthesia team.  Phenylephrine boluses were given to support blood pressure. Successful cardioversion was performed with a 100 J. The patient converted to sinus rhythm. Pre-and post EKGs were reviewed. The patient tolerated the procedure with no immediate complications.  Recommendations: Continue Amiodarone and Warfarin.   Patient Profile     76 y.o. male with history of coronary artery disease status post remote inferior MI status post stenting, chronic systolic heart failure/ischemic cardiomyopathy, paroxysmal atrial flutter s/p DCCV on 10/21/17 on anticoagulation with warfarin and end-stage renal disease on hemodialysis who presented with atrial flutter with rapid ventricular response with associated hypotension.  Assessment & Plan    1. Afib/flutter: Rate relatively well controlled on current medication regiment Recommend we hold the warfarin as he is subtherapeutic Suggest we start eliquis 5 twice daily Would consider outpatient cardioversion He has been on heparin infusion  while off warfarin  2. CAD: -No symptoms concerning for ischemia -Continue current medication regimen  3. Hypotension: -Resolved  4. ESRD on HD: -AV graft occluded Had angiogram   Total encounter time more than 25 minutes  Greater than 50% was spent in counseling and coordination of care with the patient   For questions or updates, please contact [place] Please consult [email or web address] for contact info under Cardiology/STEMI.    Signed, Esmond Plants,  MD, Ph.D [facility]

## 2017-10-26 NOTE — Progress Notes (Addendum)
   Telemetry reviewed with MD. Afib/flutter with well controlled ventricular rates. INR 1.4 this morning. MD discussed with pharmacy, planning to transition to Eliquis 5 mg bid. Continue to load with amiodarone with plans for possible repeat TEE/DCCV as an outpatient. Unlikely to hold sinus rhythm if he were to have a repeat DCCV at this time.

## 2017-10-26 NOTE — Progress Notes (Signed)
Pre HD assessment  

## 2017-10-26 NOTE — Progress Notes (Signed)
ANTICOAGULATION CONSULT NOTE - Initial Consult  Pharmacy Consult for heparin Indication: AF  Allergies  Allergen Reactions  . Ace Inhibitors Nausea And Vomiting  . Amoxicillin-Pot Clavulanate Nausea And Vomiting       . Other Nausea And Vomiting    Anti-inflammatories    Patient Measurements: Height: 5\' 9"  (175.3 cm) Weight: 258 lb 9.6 oz (117.3 kg) IBW/kg (Calculated) : 70.7 Heparin Dosing Weight: 96.6 kg  Vital Signs: Temp: 98.3 F (36.8 C) (10/31 0844) Temp Source: Oral (10/31 0844) BP: 111/74 (10/31 0844) Pulse Rate: 89 (10/31 0844)  Labs:  Recent Labs  10/24/17 0422 10/24/17 1027 10/24/17 1732  10/25/17 0118 10/25/17 1344 10/25/17 2228 10/26/17 0823  HGB 11.3*  --   --   --   --  12.3*  --  11.4*  HCT 33.6*  --   --   --   --  38.1*  --  35.2*  PLT 108*  --   --   --   --  94*  --  123*  APTT  --   --  32  --   --   --   --   --   LABPROT 22.7*  --   --   --  17.8*  --   --  17.4*  INR 2.02  --   --   --  1.48  --   --  1.44  HEPARINUNFRC  --   --   --   < > 0.40 0.86* 0.24* 0.33  CREATININE  --  8.22*  --   --   --   --   --   --   < > = values in this interval not displayed.  Estimated Creatinine Clearance: 9.8 mL/min (A) (by C-G formula based on SCr of 8.22 mg/dL (H)).   Medical History: Past Medical History:  Diagnosis Date  . Arthritis   . Atrial flutter ([place])    a. s/p DCCV 07/2017 briefly successful; b. amio and warfarin; c. CHADS2VASc => 5 (CHF, HTN, age x 2, vascular disease);  [name] CAD (coronary artery disease)    a. remote inferior MI s/p stenting; b. Myoview in 10/2016 with normal perfusion with an EF of 46%  . COPD (chronic obstructive pulmonary disease) ([place])   . ESRD (end stage renal disease) on dialysis ([place])    a. HD MWF  . GERD (gastroesophageal reflux disease)   . Hyperlipidemia   . Hypertension   . Ischemic cardiomyopathy    a. TTE 05/2017: EF 35-40%, unable to exlcude RWMA, mild MR, mildly dilated RV  . Prostate cancer ([place])    . Shortness of breath dyspnea     Medications:  Infusions:  . heparin 1,400 Units/hr (10/26/17 0036)    Assessment: 50 yom now post declotting catheter. Cardiology following for AF - patient on VKA PTA. Received vitamin K recently for procedure, pharmacy consulted to manage UFH for bridging.   Goal of Therapy:  Heparin level 0.3-0.7 units/ml Monitor platelets by anticoagulation protocol: Yes   Plan: Heparin level elevated at 0.86. Decrease rate to 1300 units/hr and recheck in 6 hours  10/30 2230 heparin level 0.24. Increase to 1400 units/hr with no bolus. Increasing cautiously due to large decrease in heparin level between previous rate changes.  Ramond Dial, Pharm.D., BCPS Clinical Pharmacist 10/26/2017,9:33 AM

## 2017-10-26 NOTE — Progress Notes (Signed)
Discharge instructions given to patient and family. IV and tele off. Patient verbalized understanding with questions answered. No complaints. Patient transported via wheelchair.

## 2017-10-26 NOTE — Care Management (Signed)
Patient's INR not therapeutic and use of Eliquis is being discussed for patient atrial fib/flutter.  Patient was on Eliquis in June and not clear reason he was switched to coumadin.  Discussed with pharmacy.  Patient has been provided with the 30 day trial coupon and and used.  He will be subject to his usual copay if restarts the medication.

## 2017-10-26 NOTE — Progress Notes (Signed)
Post HD assessment  

## 2017-10-26 NOTE — Progress Notes (Signed)
HD tx end  

## 2017-10-26 NOTE — Progress Notes (Signed)
ANTICOAGULATION CONSULT NOTE - Initial Consult  Pharmacy Consult for heparin Indication: AF  Allergies  Allergen Reactions  . Ace Inhibitors Nausea And Vomiting  . Amoxicillin-Pot Clavulanate Nausea And Vomiting       . Other Nausea And Vomiting    Anti-inflammatories    Patient Measurements: Height: 5\' 9"  (175.3 cm) Weight: 268 lb 8.3 oz (121.8 kg) IBW/kg (Calculated) : 70.7 Heparin Dosing Weight: 96.6 kg  Vital Signs: Temp: 98 F (36.7 C) (10/31 0945) Temp Source: Oral (10/31 0945) BP: 108/73 (10/31 1145) Pulse Rate: 99 (10/31 1145)  Labs:  Recent Labs  10/24/17 0422 10/24/17 0928 10/24/17 1732  10/25/17 0118 10/25/17 1344 10/25/17 2228 10/26/17 0823 10/26/17 1018  HGB 11.3*  --   --   --   --  12.3*  --  11.4* 11.0*  HCT 33.6*  --   --   --   --  38.1*  --  35.2* 34.8*  PLT 108*  --   --   --   --  94*  --  123* 128*  APTT  --   --  32  --   --   --   --   --   --   LABPROT 22.7*  --   --   --  17.8*  --   --  17.4*  --   INR 2.02  --   --   --  1.48  --   --  1.44  --   HEPARINUNFRC  --   --   --   < > 0.40 0.86* 0.24* 0.33  --   CREATININE  --  8.22*  --   --   --   --   --   --  7.48*  < > = values in this interval not displayed.  Estimated Creatinine Clearance: 11 mL/min (A) (by C-G formula based on SCr of 7.48 mg/dL (H)).   Medical History: Past Medical History:  Diagnosis Date  . Arthritis   . Atrial flutter ([place])    a. s/p DCCV 07/2017 briefly successful; b. amio and warfarin; c. CHADS2VASc => 5 (CHF, HTN, age x 2, vascular disease);  [name] CAD (coronary artery disease)    a. remote inferior MI s/p stenting; b. Myoview in 10/2016 with normal perfusion with an EF of 46%  . COPD (chronic obstructive pulmonary disease) ([place])   . ESRD (end stage renal disease) on dialysis ([place])    a. HD MWF  . GERD (gastroesophageal reflux disease)   . Hyperlipidemia   . Hypertension   . Ischemic cardiomyopathy    a. TTE 05/2017: EF 35-40%, unable to exlcude RWMA,  mild MR, mildly dilated RV  . Prostate cancer ([place])   . Shortness of breath dyspnea     Medications:  Infusions:    Assessment: 72 yom now post declotting catheter. Cardiology following for AF - patient on VKA PTA. Received vitamin K recently for procedure, pharmacy consulted to manage UFH for bridging.   Goal of Therapy:  Heparin level 0.3-0.7 units/ml Monitor platelets by anticoagulation protocol: Yes   Plan: Heparin level therapeutic. Pt will be transitioned to apixaban. Drip can be d/c at the same time first apixaban dose is given  Ramond Dial, Pharm.D., BCPS Clinical Pharmacist 10/26/2017,11:56 AM

## 2017-10-26 NOTE — Plan of Care (Signed)
Problem: Education: Goal: Knowledge of [facility] Education information/materials will improve Outcome: Completed/Met Date Met: 10/26/17 No complaints of pain this shift, heparin gtt infusing at 58m/hr. Up to bathroom this morning standby assist, tolerated well to give himself a bath. Hemodialysis planned for today.

## 2017-10-26 NOTE — Progress Notes (Signed)
HD tx start 

## 2017-10-26 NOTE — Discharge Summary (Signed)
[facility] NAME: Maurice Peterson    MR#:  [id number]  DATE OF BIRTH:  07/07/1941  DATE OF ADMISSION:  10/19/2017 ADMITTING PHYSICIAN: Max Sane, MD  DATE OF DISCHARGE: 10/26/17  PRIMARY CARE PHYSICIAN: Madelyn Brunner, MD    ADMISSION DIAGNOSIS:  Pleuritic chest pain [R07.81] COPD exacerbation ([place]) [J44.1] Sepsis, due to unspecified organism ([place]) [A41.9]  DISCHARGE DIAGNOSIS:  Acute on chronic atrial flutter fibrillation status post cardioversion.  Patient back in atrial flutter.  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 Arthritis   . Atrial flutter ([place])    a. s/p DCCV 07/2017 briefly successful; b. amio and warfarin; c. CHADS2VASc => 5 (CHF, HTN, age x 2, vascular disease);  [name] CAD (coronary artery disease)    a. remote inferior MI s/p stenting; b. Myoview in 10/2016 with normal perfusion with an EF of 46%  . COPD (chronic obstructive pulmonary disease) ([place])   . ESRD (end stage renal disease) on dialysis ([place])    a. HD MWF  . GERD (gastroesophageal reflux disease)   . Hyperlipidemia   . Hypertension   . Ischemic cardiomyopathy    a. TTE 05/2017: EF 35-40%, unable to exlcude RWMA, mild MR, mildly dilated RV  . Prostate cancer ([place])   . Shortness of breath dyspnea     HOSPITAL COURSE:   76 y m with known history ofESRD on HD (MWF), atrial flutter on Coumadin, COPD, hypertension and hyperlipidemia being admitted for possible sepsis  * Atrial fibrillation/flutter -Patient is status post cardioversion was in sinus rhythm---.  Atrial flutter in the 90s today - resume amiodarone at400 twice a day 2 weeks and then 200 twice daily --Cardiology recommends IV heparin drip to be started back after  vascular declotting and resumed Coumadin  -INR is subtherapeutic at 1.4.    Received  IV heparin drip.  -  Discussed with cardiology Dr. Rockey Situ --- oral anticoagulation changed to Eliquis since INR is drifting down despite being on Coumadin.  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 D/w dr Ronalee Belts -Temporary dialysis catheter to be removed per Dr. Holley Raring. -Patient getting dialyzed today through his HD access  CONSULTS OBTAINED:  Treatment Team:  Murlean Iba, MD Minna Merritts, MD Schnier, Dolores Lory, MD  DRUG ALLERGIES:   Allergies  Allergen Reactions  . Ace Inhibitors Nausea And Vomiting  . Amoxicillin-Pot Clavulanate Nausea And Vomiting       . Other Nausea And Vomiting    Anti-inflammatories    DISCHARGE MEDICATIONS:   Current Discharge Medication List    START taking these medications   Details  apixaban (ELIQUIS) 5 MG TABS tablet Take 1 tablet (5 mg total) by mouth 2 (two) times daily. Qty: 60 tablet, Refills: 2      CONTINUE these medications which have CHANGED   Details  amiodarone (PACERONE) 200 MG tablet Take 400 mg (2 tab) by mouth twice a day for 1 week  then taken 200 mg (1 tab) by mouth  Twice a day Qty: 90 tablet, Refills: 0      CONTINUE these medications which have NOT CHANGED   Details  atorvastatin (LIPITOR) 10 MG tablet Take 10 mg by mouth every evening.     calcium acetate (PHOSLO) 667 MG capsule Take 1,334 mg by mouth 3 (three) times daily with meals.    carvedilol (COREG) 6.25 MG tablet Take 1 tablet (6.25 mg total) by mouth 2 (two)  times daily with a meal. Qty: 60 tablet, Refills: 1    Cholecalciferol (VITAMIN D3) 2000 units TABS Take 2,000 Units by mouth daily.     febuxostat (ULORIC) 40 MG tablet Take 40 mg by mouth every evening.     furosemide (LASIX) 80 MG tablet Take 80 mg by mouth daily.    montelukast (SINGULAIR) 10 MG tablet Take 10 mg by mouth every evening.     multivitamin (RENA-VIT) TABS tablet Take 1 tablet by mouth daily.    pantoprazole (PROTONIX) 40 MG tablet Take 40 mg by mouth daily.    predniSONE (DELTASONE) 10 MG tablet Take 30 mg x 1 week, take 20 mg x 1 week, take 10 mg x 1 week, then off    umeclidinium-vilanterol (ANORO ELLIPTA) 62.5-25 MCG/INH AEPB Inhale 1 puff into the lungs daily.    acetaminophen (TYLENOL) 500 MG tablet Take 1,000 mg by mouth daily as needed for moderate pain or headache.    albuterol (PROVENTIL HFA;VENTOLIN HFA) 108 (90 BASE) MCG/ACT inhaler Inhale 2 puffs into the lungs every 4 (four) hours as needed for wheezing or shortness of breath.    azelastine (OPTIVAR) 0.05 % ophthalmic solution Place 1 drop into both eyes 2 (two) times daily as needed (allergies).    dextromethorphan-guaiFENesin (MUCINEX DM) 30-600 MG 12hr tablet Take 1 tablet by mouth at bedtime as needed for cough.    DiphenhydrAMINE HCl, Sleep, (ZZZQUIL PO) Take 25 mg by mouth at bedtime as needed (sleep).       STOP taking these medications     warfarin (COUMADIN) 3 MG tablet         If you experience worsening of your admission symptoms, develop shortness of breath, life threatening emergency, suicidal or homicidal thoughts you must seek medical attention immediately by calling 911 or calling your MD immediately  if symptoms less severe.  You Must read complete instructions/literature along with all the possible adverse reactions/side effects for all the Medicines you take and that have been prescribed to you. Take any new Medicines after you have completely understood and accept all the  possible adverse reactions/side effects.   Please note  You were cared for by a hospitalist during your hospital stay. If you have any questions about your discharge medications or the care you received while you were in the hospital after you are discharged, you can call the unit and asked to speak with the hospitalist on call if the hospitalist that took care of you is not available. Once you are discharged, your primary care physician will handle any further medical issues. Please note that NO REFILLS for any discharge medications will be authorized once you are discharged, as it is imperative that you return to your primary care physician (or establish a relationship with a primary care physician if you do not have one) for your aftercare needs so that they can reassess your need for medications and monitor your lab values. Today   SUBJECTIVE   Doing well  VITAL  SIGNS:  Blood pressure 92/67, pulse 99, temperature 98 F (36.7 C), resp. rate 20, height 5\' 9"  (1.753 m), weight 121.8 kg (268 lb 8.3 oz), SpO2 98 %.  I/O:   Intake/Output Summary (Last 24 hours) at 10/26/17 1429 Last data filed at 10/26/17 1355  Gross per 24 hour  Intake          1046.83 ml  Output             2036 ml  Net          -989.17 ml    PHYSICAL EXAMINATION:  GENERAL:  76 y.o.-year-old patient lying in the bed with no acute distress.  obese EYES: Pupils equal, round, reactive to light and accommodation. No scleralcterus. Extraocular muscles intact.  HEENT: Head atraumatic, normocephalic. Oropharynx and nasopharynx clear.  NECK:  Supple, no jugular venous distention. No thyroid enlargement, no tenderness.  LUNGS: Normal breath sounds bilaterally, no wheezing, rales,rhonchi or crepitation. No use of accessory muscles of respiration.  CARDIOVASCULAR: S1, S2 normal. No murmurs, rubs, or gallops.  ABDOMEN: Soft, non-tender, non-distended. Bowel sounds present. No organomegaly or mass.  EXTREMITIES: No pedal edema,  cyanosis, or clubbing.  NEUROLOGIC: Cranial nerves II through XII are intact. Muscle strength 5/5 in all extremities. Sensation intact. Gait not checked.  PSYCHIATRIC: The patient is alert and oriented x 3.  SKIN: Vasculitis rash upper body  DATA REVIEW:   CBC   Recent Labs Lab 10/26/17 1018  WBC 16.7*  HGB 11.0*  HCT 34.8*  PLT 128*    Chemistries   Recent Labs Lab 10/19/17 1446  10/26/17 1018  NA 136  < > 131*  K 4.1  < > 5.3*  CL 95*  < > 93*  CO2 27  < > 23  GLUCOSE 108*  < > 92  BUN 35*  < > 86*  CREATININE 4.09*  < > 7.48*  CALCIUM 8.0*  < > 8.3*  AST 16  --   --   ALT 24  --   --   ALKPHOS 45  --   --   BILITOT 1.1  --   --   < > = values in this interval not displayed.  Microbiology Results   Recent Results (from the past 240 hour(s))  Blood culture (routine x 2)     Status: None   Collection Time: 10/19/17  4:30 PM  Result Value Ref Range Status   Specimen Description BLOOD RIGHT ARM  Final   Special Requests   Final    BOTTLES DRAWN AEROBIC AND ANAEROBIC Blood Culture adequate volume   Culture NO GROWTH 5 DAYS  Final   Report Status 10/24/2017 FINAL  Final  Blood culture (routine x 2)     Status: None   Collection Time: 10/19/17  7:37 PM  Result Value Ref Range Status   Specimen Description BLOOD RIGHT SHOULDER  Final   Special Requests   Final    BOTTLES DRAWN AEROBIC AND ANAEROBIC Blood Culture adequate volume   Culture NO GROWTH 5 DAYS  Final   Report Status 10/24/2017 FINAL  Final  MRSA PCR Screening     Status: None   Collection Time: 10/19/17  7:38 PM  Result Value Ref Range Status   MRSA by PCR NEGATIVE NEGATIVE Final    Comment:        The GeneXpert MRSA Assay (FDA approved for NASAL specimens only), is one component of a comprehensive MRSA colonization surveillance program. It is not intended to  diagnose MRSA infection nor to guide or monitor treatment for MRSA infections.     RADIOLOGY:  No results found.   Management  plans discussed with the patient, family and they are in agreement.  CODE STATUS:     Code Status Orders        Start     Ordered   10/19/17 2000  Full code  Continuous     10/19/17 1959    Code Status History    Date Active Date Inactive Code Status Order ID Comments User Context   06/20/2017  4:48 PM 06/22/2017  7:26 PM Full Code [id number]  Bettey Costa, MD Inpatient   06/17/2017  6:50 PM 06/19/2017  2:32 PM Full Code [id number]  Henreitta Leber, MD Inpatient   08/05/2015  3:25 PM 08/05/2015  6:45 PM Full Code [id number]  Delana Meyer, Dolores Lory, MD Inpatient   05/20/2015  4:18 PM 05/20/2015  7:37 PM Full Code [id number]  Schnier, Dolores Lory, MD Inpatient    Advance Directive Documentation     Most Recent Value  Type of Advance Directive  Healthcare Power of Attorney  Pre-existing out of facility DNR order (yellow form or pink MOST form)  -  "MOST" Form in Place?  -      TOTAL TIME TAKING CARE OF THIS PATIENT: *40* minutes.    Ellisa Devivo M.D on 10/26/2017 at 2:29 PM  Between 7am to 6pm - Pager - [phone] After 6pm go to [email or web address] - password [email or web address] [place]  Office  [phone]  CC: Primary care physician; Madelyn Brunner, MD

## 2017-10-27 ENCOUNTER — Other Ambulatory Visit: Payer: Self-pay

## 2017-10-27 DIAGNOSIS — Z79899 Other long term (current) drug therapy: Secondary | ICD-10-CM

## 2017-10-27 NOTE — Telephone Encounter (Signed)
Pt is aware and agreeable to lab results. Recheck TSH is scheduled in [place] on 11/13. Orders are in and pt is agreeable.

## 2017-11-03 DIAGNOSIS — N186 End stage renal disease: Secondary | ICD-10-CM | POA: Insufficient documentation

## 2017-11-07 ENCOUNTER — Telehealth: Payer: Self-pay | Admitting: Internal Medicine

## 2017-11-07 NOTE — Telephone Encounter (Signed)
New message   Patient calling   Pt c/o medication issue:  1. Name of Medication: apixaban (ELIQUIS) 5 MG TABS tablet  2. How are you currently taking this medication (dosage and times per day)? Take 1 tablet (5 mg total) by mouth 2 (two) times daily.  3. Are you having a reaction (difficulty breathing--STAT)? No   4. What is your medication issue? Need prior authorization .

## 2017-11-08 ENCOUNTER — Other Ambulatory Visit: Payer: Self-pay | Admitting: Internal Medicine

## 2017-11-08 ENCOUNTER — Other Ambulatory Visit (INDEPENDENT_AMBULATORY_CARE_PROVIDER_SITE_OTHER): Payer: Medicare Other

## 2017-11-08 ENCOUNTER — Other Ambulatory Visit: Payer: Self-pay

## 2017-11-08 ENCOUNTER — Telehealth: Payer: Self-pay

## 2017-11-08 ENCOUNTER — Other Ambulatory Visit: Payer: Medicare Other

## 2017-11-08 DIAGNOSIS — I4892 Unspecified atrial flutter: Secondary | ICD-10-CM | POA: Diagnosis not present

## 2017-11-08 NOTE — Telephone Encounter (Signed)
Patient here in office. We had not received prior authorization at this time. Pharmacy called and asked to fax over prior auth. Samples provided to patient until prior auth can be completed. Medication Samples have been provided to the patient.  Drug name: Eliquis       Strength: 5mg         Qty: 2 boxes  LOT: [id number]  Exp.Date: Mar 2021.

## 2017-11-08 NOTE — Telephone Encounter (Signed)
Please review for prior authorization for Eliquis 5 mg one tablet twice a day.

## 2017-11-09 NOTE — Telephone Encounter (Signed)
This was sent to the coumadin clinic.   This should be handled by prescribing MDs support.

## 2017-11-10 LAB — TSH: TSH: 18.17 u[IU]/mL — ABNORMAL HIGH (ref 0.450–4.500)

## 2017-11-10 NOTE — Telephone Encounter (Signed)
In further review of patient's chart, patient's primary cardiologist is Dr Nehemiah Massed. Called Dr Alveria Apley office and confirmed patient is continuing to receive care from Dr Nehemiah Massed. Patient next appointment with him is 12/05/17.    [facility] pharmacy to notify them that patient's primary cardiologist is Dr Nehemiah Massed and to reach out to his office regarding the prior authorization and to call us back if anything else is needed.

## 2017-11-10 NOTE — Telephone Encounter (Signed)
Please review for prior authorization for Eliquis. Thanks!

## 2017-11-10 NOTE — Telephone Encounter (Signed)
Spoke with pharmacist at [facility]. He stated the prior auth is suppose to generate automatically; however, we have not received anything at this time. I was provided with number to call help desk for Prior auth [id number].

## 2017-11-22 ENCOUNTER — Encounter: Payer: Self-pay | Admitting: Internal Medicine

## 2017-11-22 ENCOUNTER — Ambulatory Visit (INDEPENDENT_AMBULATORY_CARE_PROVIDER_SITE_OTHER): Payer: Medicare Other | Admitting: Internal Medicine

## 2017-11-22 VITALS — BP 82/60 | HR 95 | Ht 71.0 in | Wt 242.5 lb

## 2017-11-22 DIAGNOSIS — I4892 Unspecified atrial flutter: Secondary | ICD-10-CM

## 2017-11-22 DIAGNOSIS — Z01812 Encounter for preprocedural laboratory examination: Secondary | ICD-10-CM | POA: Diagnosis not present

## 2017-11-22 DIAGNOSIS — R7989 Other specified abnormal findings of blood chemistry: Secondary | ICD-10-CM

## 2017-11-22 MED ORDER — LEVOTHYROXINE SODIUM 25 MCG PO TABS: 25.0000 ug | ORAL_TABLET | Freq: Every day | ORAL | 5 refills | Status: DC

## 2017-11-22 MED ORDER — METOPROLOL SUCCINATE ER 25 MG PO TB24: 12.5000 mg | ORAL_TABLET | Freq: Every day | ORAL | 3 refills | Status: DC

## 2017-11-22 NOTE — Patient Instructions (Addendum)
Medication Instructions:  Your physician has recommended you make the following change in your medication:  STOP taking coreg START taking metoprolol 12.5mg  once daily (in the evening) START taking synthroid 43mcg once daily   Labwork: BMET, CBC, PT/INR Recheck TSH in 6 weeks at the [facility] lab.No appointment necessary.  Testing/Procedures: Your physician has recommended that you have a Cardioversion (DCCV). Electrical Cardioversion uses a jolt of electricity to your heart either through paddles or wired patches attached to your chest. This is a controlled, usually prescheduled, procedure. Defibrillation is done under light anesthesia in the hospital, and you usually go home the day of the procedure. This is done to get your heart back into a normal rhythm. You are not awake for the procedure. Please see the instruction sheet given to you today.   Friday, November 30 6:30am arrival [facility] : [facility] Nothing to eat or drink after midnight before your labs.  Please take your morning medications with a sip of water.   Follow-Up: Your physician recommends that you schedule a follow-up appointment in: 1 months with Dr. Caryl Comes.    Any Other Special Instructions Will Be Listed Below (If Applicable).     If you need a refill on your cardiac medications before your next appointment, please call your pharmacy.   Electrical Cardioversion Electrical cardioversion is the delivery of a jolt of electricity to restore a normal rhythm to the heart. A rhythm that is too fast or is not regular keeps the heart from pumping well. In this procedure, sticky patches or metal paddles are placed on the chest to deliver electricity to the heart from a device. This procedure may be done in an emergency if:  There is low or no blood pressure as a result of the heart rhythm.  Normal rhythm must be restored as fast as possible to protect the brain and heart from further damage.  It may save a  life.  This procedure may also be done for irregular or fast heart rhythms that are not immediately life-threatening. Tell a health care provider about:  Any allergies you have.  All medicines you are taking, including vitamins, herbs, eye drops, creams, and over-the-counter medicines.  Any problems you or family members have had with anesthetic medicines.  Any blood disorders you have.  Any surgeries you have had.  Any medical conditions you have.  Whether you are pregnant or may be pregnant. What are the risks? Generally, this is a safe procedure. However, problems may occur, including:  Allergic reactions to medicines.  A blood clot that breaks free and travels to other parts of your body.  The possible return of an abnormal heart rhythm within hours or days after the procedure.  Your heart stopping (cardiac arrest). This is rare.  What happens before the procedure? Medicines  Your health care provider may have you start taking: ? Blood-thinning medicines (anticoagulants) so your blood does not clot as easily. ? Medicines may be given to help stabilize your heart rate and rhythm.  Ask your health care provider about changing or stopping your regular medicines. This is especially important if you are taking diabetes medicines or blood thinners. General instructions  Plan to have someone take you home from the hospital or clinic.  If you will be going home right after the procedure, plan to have someone with you for 24 hours.  Follow instructions from your health care provider about eating or drinking restrictions. What happens during the procedure?  To lower your risk  of infection: ? Your health care team will wash or sanitize their hands. ? Your skin will be washed with soap.  An IV tube will be inserted into one of your veins.  You will be given a medicine to help you relax (sedative).  Sticky patches (electrodes) or metal paddles may be placed on your  chest.  An electrical shock will be delivered. The procedure may vary among health care providers and hospitals. What happens after the procedure?  Your blood pressure, heart rate, breathing rate, and blood oxygen level will be monitored until the medicines you were given have worn off.  Do not drive for 24 hours if you were given a sedative.  Your heart rhythm will be watched to make sure it does not change. This information is not intended to replace advice given to you by your health care provider. Make sure you discuss any questions you have with your health care provider. Document Released: 12/03/2002 Document Revised: 08/11/2016 Document Reviewed: 06/18/2016 Elsevier Interactive Patient Education  2017 [employer].  Electrical Cardioversion, Care After This sheet gives you information about how to care for yourself after your procedure. Your health care provider may also give you more specific instructions. If you have problems or questions, contact your health care provider. What can I expect after the procedure? After the procedure, it is common to have:  Some redness on the skin where the shocks were given.  Follow these instructions at home:  Do not drive for 24 hours if you were given a medicine to help you relax (sedative).  Take over-the-counter and prescription medicines only as told by your health care provider.  Ask your health care provider how to check your pulse. Check it often.  Rest for 48 hours after the procedure or as told by your health care provider.  Avoid or limit your caffeine use as told by your health care provider. Contact a health care provider if:  You feel like your heart is beating too quickly or your pulse is not regular.  You have a serious muscle cramp that does not go away. Get help right away if:  You have discomfort in your chest.  You are dizzy or you feel faint.  You have trouble breathing or you are short of breath.  Your  speech is slurred.  You have trouble moving an arm or leg on one side of your body.  Your fingers or toes turn cold or blue. This information is not intended to replace advice given to you by your health care provider. Make sure you discuss any questions you have with your health care provider. Document Released: 10/03/2013 Document Revised: 07/16/2016 Document Reviewed: 06/18/2016 Elsevier Interactive Patient Education  [employer].

## 2017-11-22 NOTE — H&P (View-Only) (Signed)
,skf      Patient Care Team: Baxter Hire, MD as PCP - General (Internal Medicine)   HPI  Maurice Peterson is a 75 y.o. male Seen in followup for recurrent atrial fib/flutter   This first became manifest 6/18. Was discharged on amiodarone and warfarin The patient had undergone cardioversion with significant improvement in symptoms. When seen 9/18 he was holding sinus rhythm. On return 10/18 he had reverted to atrial fibrillation/flutter and his antiarrhythmics were discontinued. He has noted significant worsening in his exercise tolerance  Recurrent atrial flutter Rx with amio and because of unstable INR assoc with Vit K given to facilitate HD catheter insertion >> switched to [place] review demonstrates a recent paper from [place] comparing warfarin and apixaban and end-stage renal disease patient.  Significant lower bleeding was noted.  No recurrent strokes in either arm.  N was relatively small.  Is also an ongoing randomized trial in [place]  He feels rotten.  There is weakness.  All carvedilol has been around about 4 months.  He brings in blood pressure reports 70--90.  He remains in atrial flutter.  Metoprolol succinate in the remote past without complications  No chest pain.   Records and Results Reviewed  Hospital records where in found to presumably septic   He has a history of coronary artery disease with prior inferior wall MI. Echocardiogram 11/17 EF 35% with a left atrial dimension of 3.8 cm  Myoview scan 11/17 EF 46% with normal perfusion (? I M I)  repeat echo 6/18 EF 35-40% (LAD 50/2.1/34)  Date TSH K LFTs  Hgb  4/18  4.2  16    10/18  18.2 5.3 16 12.6     Past Medical History:  Diagnosis Date  . Arthritis   . Atrial flutter ([place])    a. s/p DCCV 07/2017 briefly successful; b. amio and warfarin; c. CHADS2VASc => 5 (CHF, HTN, age x 2, vascular disease);  [name] CAD (coronary artery disease)    a. remote inferior MI s/p stenting; b.  Myoview in 10/2016 with normal perfusion with an EF of 46%  . COPD (chronic obstructive pulmonary disease) ([place])   . ESRD (end stage renal disease) on dialysis ([place])    a. HD MWF  . GERD (gastroesophageal reflux disease)   . Hyperlipidemia   . Hypertension   . Ischemic cardiomyopathy    a. TTE 05/2017: EF 35-40%, unable to exlcude RWMA, mild MR, mildly dilated RV  . Prostate cancer ([place])   . Shortness of breath dyspnea     Past Surgical History:  Procedure Laterality Date  . A/V SHUNT INTERVENTION Left 10/24/2017   Procedure: A/V SHUNT INTERVENTION;  Surgeon: Algernon Huxley, MD;  Location: [facility];  Service: Cardiovascular;  Laterality: Left;  . CARDIOVERSION N/A 07/26/2017   Procedure: Cardioversion;  Surgeon: Corey Skains, MD;  Location: [facility];  Service: Cardiovascular;  Laterality: N/A;  . CARDIOVERSION N/A 08/18/2017   Procedure: CARDIOVERSION;  Surgeon: Corey Skains, MD;  Location: [facility];  Service: Cardiovascular;  Laterality: N/A;  . CARDIOVERSION N/A 10/21/2017   Procedure: CARDIOVERSION;  Surgeon: Wellington Hampshire, MD;  Location: [facility];  Service: Cardiovascular;  Laterality: N/A;  . CATARACT EXTRACTION W/PHACO Right 08/20/2015   Procedure: CATARACT EXTRACTION PHACO AND INTRAOCULAR LENS PLACEMENT ([place]);  Surgeon: Leandrew Koyanagi, MD;  Location: [facility];  Service: Ophthalmology;  Laterality: Right;  . CATARACT EXTRACTION W/PHACO Left 09/10/2015   Procedure:  CATARACT EXTRACTION PHACO AND INTRAOCULAR LENS PLACEMENT (IOC);  Surgeon: Leandrew Koyanagi, MD;  Location: [facility];  Service: Ophthalmology;  Laterality: Left;  . CORONARY STENT PLACEMENT    . 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 PERIPHERAL VASCULAR CATHETERIZATION Left 05/20/2015   Procedure: A/V Shuntogram/Fistulagram;  Surgeon: Katha Cabal, MD;  Location: [facility];  Service: Cardiovascular;  Laterality: Left;  . PERIPHERAL VASCULAR  CATHETERIZATION N/A 05/20/2015   Procedure: A/V Shunt Intervention;  Surgeon: Katha Cabal, MD;  Location: [facility];  Service: Cardiovascular;  Laterality: N/A;  . PERIPHERAL VASCULAR CATHETERIZATION Left 08/05/2015   Procedure: A/V Shuntogram/Fistulagram;  Surgeon: Katha Cabal, MD;  Location: [facility];  Service: Cardiovascular;  Laterality: Left;  . PERIPHERAL VASCULAR CATHETERIZATION N/A 08/05/2015   Procedure: A/V Shunt Intervention;  Surgeon: Katha Cabal, MD;  Location: [facility];  Service: Cardiovascular;  Laterality: N/A;  . PERIPHERAL VASCULAR CATHETERIZATION Left 01/06/2016   Procedure: A/V Shuntogram/Fistulagram;  Surgeon: Katha Cabal, MD;  Location: [facility];  Service: Cardiovascular;  Laterality: Left;  . PERIPHERAL VASCULAR CATHETERIZATION N/A 01/06/2016   Procedure: A/V Shunt Intervention;  Surgeon: Katha Cabal, MD;  Location: [facility];  Service: Cardiovascular;  Laterality: N/A;  . PERIPHERAL VASCULAR CATHETERIZATION N/A 12/13/2016   Procedure: Dialysis/Perma Catheter Removal;  Surgeon: Algernon Huxley, MD;  Location: [facility];  Service: Cardiovascular;  Laterality: N/A;    Current Outpatient Medications  Medication Sig Dispense Refill  . acetaminophen (TYLENOL) 500 MG tablet Take 1,000 mg by mouth daily as needed for moderate pain or headache.    . albuterol (PROVENTIL HFA;VENTOLIN HFA) 108 (90 BASE) MCG/ACT inhaler Inhale 2 puffs into the lungs every 4 (four) hours as needed for wheezing or shortness of breath.    [name] amiodarone (PACERONE) 200 MG tablet Take 400 mg (2 tab) by mouth twice a day for 1 week  then taken 200 mg (1 tab) by mouth  Twice a day 90 tablet 0  . apixaban (ELIQUIS) 5 MG TABS tablet Take 1 tablet (5 mg total) by mouth 2 (two) times daily. 60 tablet 2  . atorvastatin (LIPITOR) 10 MG tablet Take 10 mg by mouth every evening.     [name] azelastine (OPTIVAR) 0.05 % ophthalmic  solution Place 1 drop into both eyes 2 (two) times daily as needed (allergies).    . calcium acetate (PHOSLO) 667 MG capsule Take 1,334 mg by mouth 3 (three) times daily with meals.    . carvedilol (COREG) 6.25 MG tablet Take 1 tablet (6.25 mg total) by mouth 2 (two) times daily with a meal. 60 tablet 1  . Cholecalciferol (VITAMIN D3) 2000 units TABS Take 2,000 Units by mouth daily.     [name] dextromethorphan-guaiFENesin (MUCINEX DM) 30-600 MG 12hr tablet Take 1 tablet by mouth at bedtime as needed for cough.    . DiphenhydrAMINE HCl, Sleep, (ZZZQUIL PO) Take 25 mg by mouth at bedtime as needed (sleep).     . febuxostat (ULORIC) 40 MG tablet Take 40 mg by mouth every evening.     . montelukast (SINGULAIR) 10 MG tablet Take 10 mg by mouth every evening.     . multivitamin (RENA-VIT) TABS tablet Take 1 tablet by mouth daily.    . pantoprazole (PROTONIX) 40 MG tablet Take 40 mg by mouth daily.    [name] umeclidinium-vilanterol (ANORO ELLIPTA) 62.5-25 MCG/INH AEPB Inhale 1  puff into the lungs daily.     No current facility-administered medications for this visit.    Facility-Administered Medications Ordered in Other Visits  Medication Dose Route Frequency Provider Last Rate Last Dose  . 0.9 %  sodium chloride infusion    Continuous PRN Johnna Acosta, CRNA        Allergies  Allergen Reactions  . Amoxicillin-Pot Clavulanate Nausea And Vomiting       . Other Nausea And Vomiting    Anti-inflammatories      Review of Systems negative except from HPI and PMH  Physical Exam BP (!) 82/60 (BP Location: Right Arm, Patient Position: Sitting, Cuff Size: Large)   Pulse 95   Ht 5\' 11"  (1.803 m)   Wt 242 lb 8 oz (110 kg)   BMI 33.82 kg/m  Well developed and well nourished in no acute distress HENT normal E scleral and icterus clear Neck Supple JVP flat; carotids brisk and full Clear to ausculation Rapid but Regular rate and rhythm, no murmurs gallops or rub Soft with active bowel sounds No  clubbing cyanosis  Edema Alert and oriented, grossly normal motor and sensory function Skin Warm and Dry  Some difficulty reading atrial fibrillation/flutter at 95 bpm  -/16/42 Access left -63    Assessment and Plan:  Atrial fibrillation/flutter  Ischemic cardiomyopathy  Congestive heart failure-chronic-systolic class III  End-stage renal disease on hemodialysis  Hypotension   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  There are number of possible contributors.  First is his hypotension.  Stop his carvedilol to metoprolol succinate.  Perhaps in the absence of alpha blockade blood pressure will come up.  We will also have him take it at night.  Another contributor may be carvedilol itself; previously he tolerated metoprolol succinate. He is also now become hypothyroid. the other likely culprit is the atrial arrhythmia itself   Hence we will undertake cardioversion.  We would anticipate him improving; this would be necessary to justify the ongoing use of amiodarone and dealing with a secondary consequence of drug-induced hypothyroidism.  We will begin him on Synthroid 25 mcg a day  We spent more than 50% of our >25 min visit in face to face counseling regarding the above    Current medicines are reviewed at length with the patient today .  The patient does not  have concerns regarding medicines.

## 2017-11-22 NOTE — Progress Notes (Signed)
,skf      Patient Care Team: Baxter Hire, MD as PCP - General (Internal Medicine)   HPI  Maurice Peterson is a 76 y.o. male Seen in followup for recurrent atrial fib/flutter   This first became manifest 6/18. Was discharged on amiodarone and warfarin The patient had undergone cardioversion with significant improvement in symptoms. When seen 9/18 he was holding sinus rhythm. On return 10/18 he had reverted to atrial fibrillation/flutter and his antiarrhythmics were discontinued. He has noted significant worsening in his exercise tolerance  Recurrent atrial flutter Rx with amio and because of unstable INR assoc with Vit K given to facilitate HD catheter insertion >> switched to [place] review demonstrates a recent paper from [place] comparing warfarin and apixaban and end-stage renal disease patient.  Significant lower bleeding was noted.  No recurrent strokes in either arm.  N was relatively small.  Is also an ongoing randomized trial in [place]  He feels rotten.  There is weakness.  All carvedilol has been around about 4 months.  He brings in blood pressure reports 70--90.  He remains in atrial flutter.  Metoprolol succinate in the remote past without complications  No chest pain.   Records and Results Reviewed  Hospital records where in found to presumably septic   He has a history of coronary artery disease with prior inferior wall MI. Echocardiogram 11/17 EF 35% with a left atrial dimension of 3.8 cm  Myoview scan 11/17 EF 46% with normal perfusion (? I M I)  repeat echo 6/18 EF 35-40% (LAD 50/2.1/34)  Date TSH K LFTs  Hgb  4/18  4.2  16    10/18  18.2 5.3 16 12.6     Past Medical History:  Diagnosis Date  . Arthritis   . Atrial flutter ([place])    a. s/p DCCV 07/2017 briefly successful; b. amio and warfarin; c. CHADS2VASc => 5 (CHF, HTN, age x 2, vascular disease);  [name] CAD (coronary artery disease)    a. remote inferior MI s/p stenting; b.  Myoview in 10/2016 with normal perfusion with an EF of 46%  . COPD (chronic obstructive pulmonary disease) ([place])   . ESRD (end stage renal disease) on dialysis ([place])    a. HD MWF  . GERD (gastroesophageal reflux disease)   . Hyperlipidemia   . Hypertension   . Ischemic cardiomyopathy    a. TTE 05/2017: EF 35-40%, unable to exlcude RWMA, mild MR, mildly dilated RV  . Prostate cancer ([place])   . Shortness of breath dyspnea     Past Surgical History:  Procedure Laterality Date  . A/V SHUNT INTERVENTION Left 10/24/2017   Procedure: A/V SHUNT INTERVENTION;  Surgeon: Algernon Huxley, MD;  Location: [facility];  Service: Cardiovascular;  Laterality: Left;  . CARDIOVERSION N/A 07/26/2017   Procedure: Cardioversion;  Surgeon: Corey Skains, MD;  Location: [facility];  Service: Cardiovascular;  Laterality: N/A;  . CARDIOVERSION N/A 08/18/2017   Procedure: CARDIOVERSION;  Surgeon: Corey Skains, MD;  Location: [facility];  Service: Cardiovascular;  Laterality: N/A;  . CARDIOVERSION N/A 10/21/2017   Procedure: CARDIOVERSION;  Surgeon: Wellington Hampshire, MD;  Location: [facility];  Service: Cardiovascular;  Laterality: N/A;  . CATARACT EXTRACTION W/PHACO Right 08/20/2015   Procedure: CATARACT EXTRACTION PHACO AND INTRAOCULAR LENS PLACEMENT ([place]);  Surgeon: Leandrew Koyanagi, MD;  Location: [facility];  Service: Ophthalmology;  Laterality: Right;  . CATARACT EXTRACTION W/PHACO Left 09/10/2015   Procedure:  CATARACT EXTRACTION PHACO AND INTRAOCULAR LENS PLACEMENT ([facility]);  Surgeon: Leandrew Koyanagi, MD;  Location: [facility];  Service: Ophthalmology;  Laterality: Left;  . CORONARY STENT PLACEMENT    . 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 PERIPHERAL VASCULAR CATHETERIZATION Left 05/20/2015   Procedure: A/V Shuntogram/Fistulagram;  Surgeon: Katha Cabal, MD;  Location: [facility];  Service: Cardiovascular;  Laterality: Left;  . PERIPHERAL VASCULAR  CATHETERIZATION N/A 05/20/2015   Procedure: A/V Shunt Intervention;  Surgeon: Katha Cabal, MD;  Location: [facility];  Service: Cardiovascular;  Laterality: N/A;  . PERIPHERAL VASCULAR CATHETERIZATION Left 08/05/2015   Procedure: A/V Shuntogram/Fistulagram;  Surgeon: Katha Cabal, MD;  Location: [facility];  Service: Cardiovascular;  Laterality: Left;  . PERIPHERAL VASCULAR CATHETERIZATION N/A 08/05/2015   Procedure: A/V Shunt Intervention;  Surgeon: Katha Cabal, MD;  Location: [facility];  Service: Cardiovascular;  Laterality: N/A;  . PERIPHERAL VASCULAR CATHETERIZATION Left 01/06/2016   Procedure: A/V Shuntogram/Fistulagram;  Surgeon: Katha Cabal, MD;  Location: [facility];  Service: Cardiovascular;  Laterality: Left;  . PERIPHERAL VASCULAR CATHETERIZATION N/A 01/06/2016   Procedure: A/V Shunt Intervention;  Surgeon: Katha Cabal, MD;  Location: [facility];  Service: Cardiovascular;  Laterality: N/A;  . PERIPHERAL VASCULAR CATHETERIZATION N/A 12/13/2016   Procedure: Dialysis/Perma Catheter Removal;  Surgeon: Algernon Huxley, MD;  Location: [facility];  Service: Cardiovascular;  Laterality: N/A;    Current Outpatient Medications  Medication Sig Dispense Refill  . acetaminophen (TYLENOL) 500 MG tablet Take 1,000 mg by mouth daily as needed for moderate pain or headache.    . albuterol (PROVENTIL HFA;VENTOLIN HFA) 108 (90 BASE) MCG/ACT inhaler Inhale 2 puffs into the lungs every 4 (four) hours as needed for wheezing or shortness of breath.    [name] amiodarone (PACERONE) 200 MG tablet Take 400 mg (2 tab) by mouth twice a day for 1 week  then taken 200 mg (1 tab) by mouth  Twice a day 90 tablet 0  . apixaban (ELIQUIS) 5 MG TABS tablet Take 1 tablet (5 mg total) by mouth 2 (two) times daily. 60 tablet 2  . atorvastatin (LIPITOR) 10 MG tablet Take 10 mg by mouth every evening.     [name] azelastine (OPTIVAR) 0.05 % ophthalmic  solution Place 1 drop into both eyes 2 (two) times daily as needed (allergies).    . calcium acetate (PHOSLO) 667 MG capsule Take 1,334 mg by mouth 3 (three) times daily with meals.    . carvedilol (COREG) 6.25 MG tablet Take 1 tablet (6.25 mg total) by mouth 2 (two) times daily with a meal. 60 tablet 1  . Cholecalciferol (VITAMIN D3) 2000 units TABS Take 2,000 Units by mouth daily.     [name] dextromethorphan-guaiFENesin (MUCINEX DM) 30-600 MG 12hr tablet Take 1 tablet by mouth at bedtime as needed for cough.    . DiphenhydrAMINE HCl, Sleep, (ZZZQUIL PO) Take 25 mg by mouth at bedtime as needed (sleep).     . febuxostat (ULORIC) 40 MG tablet Take 40 mg by mouth every evening.     . montelukast (SINGULAIR) 10 MG tablet Take 10 mg by mouth every evening.     . multivitamin (RENA-VIT) TABS tablet Take 1 tablet by mouth daily.    . pantoprazole (PROTONIX) 40 MG tablet Take 40 mg by mouth daily.    [name] umeclidinium-vilanterol (ANORO ELLIPTA) 62.5-25 MCG/INH AEPB Inhale 1  puff into the lungs daily.     No current facility-administered medications for this visit.    Facility-Administered Medications Ordered in Other Visits  Medication Dose Route Frequency Provider Last Rate Last Dose  . 0.9 %  sodium chloride infusion    Continuous PRN Johnna Acosta, CRNA        Allergies  Allergen Reactions  . Amoxicillin-Pot Clavulanate Nausea And Vomiting       . Other Nausea And Vomiting    Anti-inflammatories      Review of Systems negative except from HPI and PMH  Physical Exam BP (!) 82/60 (BP Location: Right Arm, Patient Position: Sitting, Cuff Size: Large)   Pulse 95   Ht 5\' 11"  (1.803 m)   Wt 242 lb 8 oz (110 kg)   BMI 33.82 kg/m  Well developed and well nourished in no acute distress HENT normal E scleral and icterus clear Neck Supple JVP flat; carotids brisk and full Clear to ausculation Rapid but Regular rate and rhythm, no murmurs gallops or rub Soft with active bowel sounds No  clubbing cyanosis  Edema Alert and oriented, grossly normal motor and sensory function Skin Warm and Dry  Some difficulty reading atrial fibrillation/flutter at 95 bpm  -/16/42 Access left -63    Assessment and Plan:  Atrial fibrillation/flutter  Ischemic cardiomyopathy  Congestive heart failure-chronic-systolic class III  End-stage renal disease on hemodialysis  Hypotension   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  There are number of possible contributors.  First is his hypotension.  Stop his carvedilol to metoprolol succinate.  Perhaps in the absence of alpha blockade blood pressure will come up.  We will also have him take it at night.  Another contributor may be carvedilol itself; previously he tolerated metoprolol succinate. He is also now become hypothyroid. the other likely culprit is the atrial arrhythmia itself   Hence we will undertake cardioversion.  We would anticipate him improving; this would be necessary to justify the ongoing use of amiodarone and dealing with a secondary consequence of drug-induced hypothyroidism.  We will begin him on Synthroid 25 mcg a day  We spent more than 50% of our >25 min visit in face to face counseling regarding the above    Current medicines are reviewed at length with the patient today .  The patient does not  have concerns regarding medicines.

## 2017-11-23 LAB — BASIC METABOLIC PANEL
BUN / CREAT RATIO: 5 — AB (ref 10–24)
BUN: 34 mg/dL — AB (ref 8–27)
CO2: 25 mmol/L (ref 20–29)
CREATININE: 6.92 mg/dL — AB (ref 0.76–1.27)
Calcium: 8.9 mg/dL (ref 8.6–10.2)
Chloride: 98 mmol/L (ref 96–106)
GFR, EST AFRICAN AMERICAN: 8 mL/min/{1.73_m2} — AB (ref >59)
GFR, EST NON AFRICAN AMERICAN: 7 mL/min/{1.73_m2} — AB (ref >59)
GLUCOSE: 85 mg/dL (ref 65–99)
Potassium: 5 mmol/L (ref 3.5–5.2)
Sodium: 144 mmol/L (ref 134–144)

## 2017-11-23 LAB — CBC
HEMATOCRIT: 34.8 % — AB (ref 37.5–51.0)
HEMOGLOBIN: 11.2 g/dL — AB (ref 13.0–17.7)
MCH: 29.9 pg (ref 26.6–33.0)
MCHC: 32.2 g/dL (ref 31.5–35.7)
MCV: 93 fL (ref 79–97)
Platelets: 259 10*3/uL (ref 150–379)
RBC: 3.74 x10E6/uL — ABNORMAL LOW (ref 4.14–5.80)
RDW: 17 % — AB (ref 12.3–15.4)
WBC: 12.4 10*3/uL — ABNORMAL HIGH (ref 3.4–10.8)

## 2017-11-23 LAB — PROTIME-INR
INR: 1.3 — ABNORMAL HIGH (ref 0.8–1.2)
Prothrombin Time: 12.9 s — ABNORMAL HIGH (ref 9.1–12.0)

## 2017-11-24 ENCOUNTER — Encounter: Payer: Self-pay | Admitting: Anesthesiology

## 2017-11-24 ENCOUNTER — Encounter (INDEPENDENT_AMBULATORY_CARE_PROVIDER_SITE_OTHER): Payer: Self-pay | Admitting: Vascular Surgery

## 2017-11-24 ENCOUNTER — Ambulatory Visit (INDEPENDENT_AMBULATORY_CARE_PROVIDER_SITE_OTHER): Payer: Medicare Other | Admitting: Vascular Surgery

## 2017-11-24 ENCOUNTER — Ambulatory Visit (INDEPENDENT_AMBULATORY_CARE_PROVIDER_SITE_OTHER): Payer: Medicare Other

## 2017-11-24 VITALS — BP 84/61 | HR 95 | Resp 16 | Ht 69.0 in | Wt 242.0 lb

## 2017-11-24 DIAGNOSIS — N186 End stage renal disease: Secondary | ICD-10-CM

## 2017-11-24 DIAGNOSIS — T829XXS Unspecified complication of cardiac and vascular prosthetic device, implant and graft, sequela: Secondary | ICD-10-CM | POA: Diagnosis not present

## 2017-11-24 NOTE — Progress Notes (Signed)
Subjective:    Patient ID: Maurice Peterson, male    DOB: [birth year], 76 y.o.   MRN: [id number] Chief Complaint  Patient presents with  . Follow-up    6 month HDA   Patient presents for a six month hemodialysis access follow up. The patient underwent a duplex ultrasound of the AV access which was notable for a patent fistula / stent without any significant hemodynamic stenosis. Patient reports his hemodialysis doppler flow is stable. The patient denies any issues with hemodialysis such as cannulation problems, increased bleeding, decrease in doppler flow or recirculation. The patient also denies any fistula skin breakdown, pain, edema, pallor or ulceration of the arm / hand.    Review of Systems  Constitutional: Negative.   HENT: Negative.   Eyes: Negative.   Respiratory: Negative.   Cardiovascular: Negative.   Gastrointestinal: Negative.   Endocrine: Negative.   Genitourinary: Negative.   Musculoskeletal: Negative.   Skin: Negative.   Allergic/Immunologic: Negative.   Neurological: Negative.   Hematological: Negative.   Psychiatric/Behavioral: Negative.       Objective:   Physical Exam  Constitutional: He is oriented to person, place, and time. He appears well-developed and well-nourished. No distress.  HENT:  Head: Normocephalic and atraumatic.  Eyes: Conjunctivae are normal. Pupils are equal, round, and reactive to light.  Neck: Normal range of motion.  Cardiovascular: Normal rate, regular rhythm, normal heart sounds and intact distal pulses.  Pulses:      Radial pulses are 2+ on the right side, and 2+ on the left side.  Left upper extremity dialysis: Good bruit and thrill  Pulmonary/Chest: Effort normal and breath sounds normal.  Musculoskeletal: Normal range of motion. He exhibits no edema.  Neurological: He is alert and oriented to person, place, and time.  Skin: He is not diaphoretic.  Psychiatric: He has a normal mood and affect. His behavior is normal. Judgment and  thought content normal.  Vitals reviewed.  BP (!) 84/61   Pulse 95   Resp 16   Ht 5\' 9"  (1.753 m)   Wt 242 lb (109.8 kg)   BMI 35.74 kg/m   Past Medical History:  Diagnosis Date  . Arthritis   . Atrial flutter ([place])    a. s/p DCCV 07/2017 briefly successful; b. amio and warfarin; c. CHADS2VASc => 5 (CHF, HTN, age x 2, vascular disease);  [name] CAD (coronary artery disease)    a. remote inferior MI s/p stenting; b. Myoview in 10/2016 with normal perfusion with an EF of 46%  . COPD (chronic obstructive pulmonary disease) ([place])   . ESRD (end stage renal disease) on dialysis ([place])    a. HD MWF  . GERD (gastroesophageal reflux disease)   . Hyperlipidemia   . Hypertension   . Ischemic cardiomyopathy    a. TTE 05/2017: EF 35-40%, unable to exlcude RWMA, mild MR, mildly dilated RV  . Prostate cancer ([place])   . Shortness of breath dyspnea    Social History   Socioeconomic History  . Marital status: Married    Spouse name: Not on file  . Number of children: Not on file  . Years of education: Not on file  . Highest education level: Not on file  Social Needs  . Financial resource strain: Not on file  . Food insecurity - worry: Not on file  . Food insecurity - inability: Not on file  . Transportation needs - medical: Not on file  . Transportation needs - non-medical: Not on file  Occupational History  . Not on file  Tobacco Use  . Smoking status: Former Smoker    Packs/day: 1.00    Years: 30.00    Pack years: 30.00    Types: Cigarettes    Last attempt to quit: 05/19/2009    Years since quitting: 8.5  . Smokeless tobacco: Never Used  Substance and Sexual Activity  . Alcohol use: No  . Drug use: No  . Sexual activity: Yes  Other Topics Concern  . Not on file  Social History Narrative  . Not on file   Past Surgical History:  Procedure Laterality Date  . A/V SHUNT INTERVENTION Left 10/24/2017   Procedure: A/V SHUNT INTERVENTION;  Surgeon: Algernon Huxley, MD;  Location: [facility];  Service: Cardiovascular;  Laterality: Left;  . CARDIOVERSION N/A 07/26/2017   Procedure: Cardioversion;  Surgeon: Corey Skains, MD;  Location: [facility];  Service: Cardiovascular;  Laterality: N/A;  . CARDIOVERSION N/A 08/18/2017   Procedure: CARDIOVERSION;  Surgeon: Corey Skains, MD;  Location: [facility];  Service: Cardiovascular;  Laterality: N/A;  . CARDIOVERSION N/A 10/21/2017   Procedure: CARDIOVERSION;  Surgeon: Wellington Hampshire, MD;  Location: [facility];  Service: Cardiovascular;  Laterality: N/A;  . CATARACT EXTRACTION W/PHACO Right 08/20/2015   Procedure: CATARACT EXTRACTION PHACO AND INTRAOCULAR LENS PLACEMENT ([place]);  Surgeon: Leandrew Koyanagi, MD;  Location: [place];  Service: Ophthalmology;  Laterality: Right;  . CATARACT EXTRACTION W/PHACO Left 09/10/2015   Procedure: CATARACT EXTRACTION PHACO AND INTRAOCULAR LENS PLACEMENT ([facility]);  Surgeon: Leandrew Koyanagi, MD;  Location: [place];  Service: Ophthalmology;  Laterality: Left;  . CORONARY STENT PLACEMENT    . 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 PERIPHERAL VASCULAR CATHETERIZATION Left 05/20/2015   Procedure: A/V Shuntogram/Fistulagram;  Surgeon: Katha Cabal, MD;  Location: [place] [facility];  Service: Cardiovascular;  Laterality: Left;  . PERIPHERAL VASCULAR CATHETERIZATION N/A 05/20/2015   Procedure: A/V Shunt Intervention;  Surgeon: Katha Cabal, MD;  Location: [place] [facility];  Service: Cardiovascular;  Laterality: N/A;  . PERIPHERAL VASCULAR CATHETERIZATION Left 08/05/2015   Procedure: A/V Shuntogram/Fistulagram;  Surgeon: Katha Cabal, MD;  Location: [place] [facility];  Service: Cardiovascular;  Laterality: Left;  . PERIPHERAL VASCULAR CATHETERIZATION N/A 08/05/2015   Procedure: A/V Shunt Intervention;  Surgeon: Katha Cabal, MD;  Location: [place] [facility];  Service: Cardiovascular;  Laterality: N/A;  . PERIPHERAL VASCULAR  CATHETERIZATION Left 01/06/2016   Procedure: A/V Shuntogram/Fistulagram;  Surgeon: Katha Cabal, MD;  Location: [place] [facility];  Service: Cardiovascular;  Laterality: Left;  . PERIPHERAL VASCULAR CATHETERIZATION N/A 01/06/2016   Procedure: A/V Shunt Intervention;  Surgeon: Katha Cabal, MD;  Location: [place] [facility];  Service: Cardiovascular;  Laterality: N/A;  . PERIPHERAL VASCULAR CATHETERIZATION N/A 12/13/2016   Procedure: Dialysis/Perma Catheter Removal;  Surgeon: Algernon Huxley, MD;  Location: [place] [facility];  Service: Cardiovascular;  Laterality: N/A;   Family History  Problem Relation Age of Onset  . Liver disease Mother   . Heart disease Mother   . Hypertension Mother   . Pancreatic cancer Father    Allergies  Allergen Reactions  . Nsaids Nausea And Vomiting    Anti-inflammatories.  . Amoxicillin-Pot Clavulanate Nausea And Vomiting    Has patient had a PCN reaction causing immediate rash, facial/tongue/throat swelling, SOB or lightheadedness with hypotension: No Has patient had a PCN reaction causing severe rash involving  mucus membranes or skin necrosis: No Has patient had a PCN reaction that required hospitalization:Yes-vomiting blood Has patient had a PCN reaction occurring within the last 10 years: Yes If all of the above answers are "NO", then may proceed with Cephalosporin use.       Assessment & Plan:  Patient presents for a six month hemodialysis access follow up. The patient underwent a duplex ultrasound of the AV access which was notable for a patent fistula / stent without any significant hemodynamic stenosis. Patient reports his hemodialysis doppler flow is stable. The patient denies any issues with hemodialysis such as cannulation problems, increased bleeding, decrease in doppler flow or recirculation. The patient also denies any fistula skin breakdown, pain, edema, pallor or ulceration of the arm / hand.   1. End stage renal disease ([place]) -  Stable Studies reviewed with patient. The patient is doing well and currently has adequate dialysis access. Duplex ultrasound of the AV access shows a patent access with no evidence of hemodynamically significant strictures or stenosis.  The patient should continue to have duplex ultrasounds of the dialysis access every six months. The patient was instructed to call the office in the interim if any issues with dialysis access / doppler flow, pain, edema, pallor, fistula skin breakdown or ulceration of the arm / hand occur. The patient expressed their understanding.  2. Complication of vascular access for dialysis, sequela - Stable As above  - VAS US DUPLEX DIALYSIS ACCESS (AVF,AVG); Future  Current Outpatient Medications on File Prior to Visit  Medication Sig Dispense Refill  . acetaminophen (TYLENOL) 500 MG tablet Take 1,000 mg by mouth daily as needed for moderate pain or headache.    . albuterol (PROVENTIL HFA;VENTOLIN HFA) 108 (90 BASE) MCG/ACT inhaler Inhale 2 puffs into the lungs every 4 (four) hours as needed for wheezing or shortness of breath.    [name] amiodarone (PACERONE) 200 MG tablet Take 400 mg (2 tab) by mouth twice a day for 1 week  then taken 200 mg (1 tab) by mouth  Twice a day (Patient taking differently: Take 200 mg by mouth 2 (two) times daily. ) 90 tablet 0  . apixaban (ELIQUIS) 5 MG TABS tablet Take 1 tablet (5 mg total) by mouth 2 (two) times daily. 60 tablet 2  . atorvastatin (LIPITOR) 10 MG tablet Take 10 mg by mouth every evening.     [name] azelastine (OPTIVAR) 0.05 % ophthalmic solution Place 1 drop into both eyes 2 (two) times daily as needed (allergies).    . calcium acetate (PHOSLO) 667 MG capsule Take 1,334 mg by mouth 3 (three) times daily with meals.    . Cholecalciferol (VITAMIN D3) 2000 units TABS Take 2,000 Units by mouth daily.     [name] dextromethorphan-guaiFENesin (MUCINEX DM) 30-600 MG 12hr tablet Take 1 tablet by mouth at bedtime as needed for cough.    .  DiphenhydrAMINE HCl, Sleep, (ZZZQUIL PO) Take 25 mg by mouth at bedtime as needed (sleep).     . febuxostat (ULORIC) 40 MG tablet Take 40 mg by mouth every evening.     [name] levothyroxine (SYNTHROID, LEVOTHROID) 25 MCG tablet Take 1 tablet (25 mcg total) by mouth daily before breakfast. 30 tablet 5  . metoprolol succinate (TOPROL-XL) 25 MG 24 hr tablet Take 0.5 tablets (12.5 mg total) by mouth daily. Take with or immediately following a meal. (Patient taking differently: Take 12.5 mg by mouth daily after supper. Take with or immediately following a meal.) 90 tablet 3  .  montelukast (SINGULAIR) 10 MG tablet Take 10 mg by mouth every evening.     . multivitamin (RENA-VIT) TABS tablet Take 1 tablet by mouth daily.    . pantoprazole (PROTONIX) 40 MG tablet Take 40 mg by mouth daily before breakfast.     . umeclidinium-vilanterol (ANORO ELLIPTA) 62.5-25 MCG/INH AEPB Inhale 1 puff into the lungs daily.     Current Facility-Administered Medications on File Prior to Visit  Medication Dose Route Frequency Provider Last Rate Last Dose  . 0.9 %  sodium chloride infusion    Continuous PRN Michelet, Colletta [place], CRNA       There are no Patient Instructions on file for this visit. No Follow-up on file.  KIMBERLY A STEGMAYER, PA-C

## 2017-11-25 ENCOUNTER — Ambulatory Visit: Payer: Medicare Other | Admitting: Anesthesiology

## 2017-11-25 ENCOUNTER — Ambulatory Visit
Admission: RE | Admit: 2017-11-25 | Discharge: 2017-11-25 | Disposition: A | Discharge status: Home / Self Care | Payer: Medicare Other | Source: Ambulatory Visit | Attending: Cardiovascular Disease | Admitting: Cardiovascular Disease

## 2017-11-25 ENCOUNTER — Encounter
Admission: RE | Disposition: A | Discharge status: Home / Self Care | Payer: Self-pay | Source: Ambulatory Visit | Attending: Cardiovascular Disease

## 2017-11-25 DIAGNOSIS — I252 Old myocardial infarction: Secondary | ICD-10-CM | POA: Diagnosis not present

## 2017-11-25 DIAGNOSIS — Z992 Dependence on renal dialysis: Secondary | ICD-10-CM | POA: Diagnosis not present

## 2017-11-25 DIAGNOSIS — K219 Gastro-esophageal reflux disease without esophagitis: Secondary | ICD-10-CM | POA: Insufficient documentation

## 2017-11-25 DIAGNOSIS — Z955 Presence of coronary angioplasty implant and graft: Secondary | ICD-10-CM | POA: Insufficient documentation

## 2017-11-25 DIAGNOSIS — I776 Arteritis, unspecified: Secondary | ICD-10-CM | POA: Insufficient documentation

## 2017-11-25 DIAGNOSIS — I2510 Atherosclerotic heart disease of native coronary artery without angina pectoris: Secondary | ICD-10-CM | POA: Insufficient documentation

## 2017-11-25 DIAGNOSIS — N186 End stage renal disease: Secondary | ICD-10-CM | POA: Insufficient documentation

## 2017-11-25 DIAGNOSIS — E785 Hyperlipidemia, unspecified: Secondary | ICD-10-CM | POA: Diagnosis not present

## 2017-11-25 DIAGNOSIS — I4892 Unspecified atrial flutter: Secondary | ICD-10-CM | POA: Diagnosis not present

## 2017-11-25 DIAGNOSIS — I5022 Chronic systolic (congestive) heart failure: Secondary | ICD-10-CM | POA: Diagnosis not present

## 2017-11-25 DIAGNOSIS — Z538 Procedure and treatment not carried out for other reasons: Secondary | ICD-10-CM | POA: Insufficient documentation

## 2017-11-25 DIAGNOSIS — Z7952 Long term (current) use of systemic steroids: Secondary | ICD-10-CM | POA: Insufficient documentation

## 2017-11-25 DIAGNOSIS — I255 Ischemic cardiomyopathy: Secondary | ICD-10-CM | POA: Diagnosis not present

## 2017-11-25 DIAGNOSIS — M1990 Unspecified osteoarthritis, unspecified site: Secondary | ICD-10-CM | POA: Diagnosis not present

## 2017-11-25 DIAGNOSIS — J449 Chronic obstructive pulmonary disease, unspecified: Secondary | ICD-10-CM | POA: Insufficient documentation

## 2017-11-25 DIAGNOSIS — I959 Hypotension, unspecified: Secondary | ICD-10-CM | POA: Insufficient documentation

## 2017-11-25 DIAGNOSIS — E032 Hypothyroidism due to medicaments and other exogenous substances: Secondary | ICD-10-CM | POA: Diagnosis not present

## 2017-11-25 DIAGNOSIS — Z87891 Personal history of nicotine dependence: Secondary | ICD-10-CM | POA: Insufficient documentation

## 2017-11-25 DIAGNOSIS — Z880 Allergy status to penicillin: Secondary | ICD-10-CM | POA: Diagnosis not present

## 2017-11-25 DIAGNOSIS — I4891 Unspecified atrial fibrillation: Secondary | ICD-10-CM | POA: Diagnosis present

## 2017-11-25 DIAGNOSIS — R5383 Other fatigue: Secondary | ICD-10-CM | POA: Diagnosis not present

## 2017-11-25 DIAGNOSIS — I132 Hypertensive heart and chronic kidney disease with heart failure and with stage 5 chronic kidney disease, or end stage renal disease: Secondary | ICD-10-CM | POA: Diagnosis not present

## 2017-11-25 DIAGNOSIS — Z7901 Long term (current) use of anticoagulants: Secondary | ICD-10-CM | POA: Insufficient documentation

## 2017-11-25 HISTORY — PX: CARDIOVERSION: EP1203

## 2017-11-25 LAB — POTASSIUM (ARMC VASCULAR LAB ONLY): Potassium (ARMC vascular lab): 4.6 (ref 3.5–5.1)

## 2017-11-25 SURGERY — CARDIOVERSION (CATH LAB)
Anesthesia: General

## 2017-11-25 MED ORDER — SODIUM CHLORIDE 0.9 % IV SOLN: INTRAVENOUS | Status: DC

## 2017-11-25 MED ORDER — PROPOFOL 10 MG/ML IV BOLUS
INTRAVENOUS | Status: AC
  Filled 2017-11-25: qty 20

## 2017-11-25 MED ORDER — SODIUM CHLORIDE 0.9 % IV SOLN
INTRAVENOUS | Status: DC
  Administered 2017-11-25: 07:00:00 via INTRAVENOUS

## 2017-11-25 NOTE — Care Management (Signed)
Pt in sinus rhythm, case cancelled.

## 2017-11-25 NOTE — Interval H&P Note (Signed)
History and Physical Interval Note:  11/25/2017 8:56 AM  Maurice Peterson  has presented today for surgery, with the diagnosis of Cardioversion   Afib  .  An EKG was performed which was suggestive that the patient was in sinus rhythm.  Heart rate was regular.  The procedure was canceled and the patient was discharged. After the patient discharge, I heard back from Dr. Caryl Comes who reviewed the EKG and thought it was possibly some form of slow SVT.  Giving adenosine might be helpful in the future to delineate this. Unfortunately, deep sedation on this patient is difficult given his baseline low blood pressure.  Last time cardioversion was performed with propofol we had to give him phenylephrine to support his blood pressure.  Kathlyn [place]

## 2017-11-25 NOTE — Progress Notes (Signed)
Patient prepped for CV this am. ECG showed normal sinus rhythm, confirmed by cardiologist. Procedure cancelled.

## 2017-11-25 NOTE — Anesthesia Preprocedure Evaluation (Signed)
Anesthesia Evaluation  Patient identified by MRN, date of birth, ID band Patient awake    Reviewed: Allergy & Precautions, NPO status , Patient's Chart, lab work & pertinent test results, reviewed documented beta blocker date and time   Airway Mallampati: III  TM Distance: >3 FB     Dental  (+) Chipped   Pulmonary former smoker,           Cardiovascular hypertension, + dysrhythmias Atrial Fibrillation      Neuro/Psych    GI/Hepatic   Endo/Other    Renal/GU      Musculoskeletal   Abdominal   Peds  Hematology   Anesthesia Other Findings EF 40 -46. Old MI on EKG.  Reproductive/Obstetrics                             Anesthesia Physical Anesthesia Plan  ASA: III  Anesthesia Plan: General   Post-op Pain Management:    Induction: Intravenous  PONV Risk Score and Plan:   Airway Management Planned:   Additional Equipment:   Intra-op Plan:   Post-operative Plan:   Informed Consent: I have reviewed the patients History and Physical, chart, labs and discussed the procedure including the risks, benefits and alternatives for the proposed anesthesia with the patient or authorized representative who has indicated his/her understanding and acceptance.     Plan Discussed with: CRNA  Anesthesia Plan Comments:         Anesthesia Quick Evaluation

## 2017-11-26 NOTE — Anesthesia Postprocedure Evaluation (Signed)
Anesthesia Post Note  Patient: Maurice Peterson  Procedure(s) Performed: CARDIOVERSION (N/A )  Anesthesia Post Evaluation   Last Vitals:  Vitals:   11/25/17 0642  Pulse: 94  Resp: 17  Temp: 36.6 C  SpO2: 96%    Last Pain:  Vitals:   11/25/17 0642  TempSrc: Oral                 Dermot Gremillion S

## 2017-11-28 ENCOUNTER — Encounter: Payer: Self-pay | Admitting: Cardiovascular Disease

## 2017-12-01 ENCOUNTER — Inpatient Hospital Stay: Payer: Self-pay

## 2017-12-01 ENCOUNTER — Ambulatory Visit (INDEPENDENT_AMBULATORY_CARE_PROVIDER_SITE_OTHER): Payer: Medicare Other | Admitting: Surgery

## 2017-12-01 ENCOUNTER — Inpatient Hospital Stay
Admission: AD | Admit: 2017-12-01 | Discharge: 2017-12-03 | DRG: 391 | Disposition: A | Discharge status: Home / Self Care | Payer: Medicare Other | Source: Ambulatory Visit | Attending: Surgery | Admitting: Surgery

## 2017-12-01 ENCOUNTER — Encounter: Payer: Self-pay | Admitting: Surgery

## 2017-12-01 VITALS — BP 94/65 | HR 106 | Temp 98.3°F | Ht 69.0 in | Wt 245.0 lb

## 2017-12-01 DIAGNOSIS — K5720 Diverticulitis of large intestine with perforation and abscess without bleeding: Principal | ICD-10-CM | POA: Diagnosis present

## 2017-12-01 DIAGNOSIS — R233 Spontaneous ecchymoses: Secondary | ICD-10-CM | POA: Diagnosis present

## 2017-12-01 DIAGNOSIS — E785 Hyperlipidemia, unspecified: Secondary | ICD-10-CM | POA: Diagnosis present

## 2017-12-01 DIAGNOSIS — Z79890 Hormone replacement therapy (postmenopausal): Secondary | ICD-10-CM | POA: Diagnosis not present

## 2017-12-01 DIAGNOSIS — J449 Chronic obstructive pulmonary disease, unspecified: Secondary | ICD-10-CM | POA: Diagnosis present

## 2017-12-01 DIAGNOSIS — Z9842 Cataract extraction status, left eye: Secondary | ICD-10-CM | POA: Diagnosis not present

## 2017-12-01 DIAGNOSIS — Z888 Allergy status to other drugs, medicaments and biological substances status: Secondary | ICD-10-CM | POA: Diagnosis not present

## 2017-12-01 DIAGNOSIS — E039 Hypothyroidism, unspecified: Secondary | ICD-10-CM | POA: Diagnosis present

## 2017-12-01 DIAGNOSIS — I480 Paroxysmal atrial fibrillation: Secondary | ICD-10-CM | POA: Diagnosis present

## 2017-12-01 DIAGNOSIS — I776 Arteritis, unspecified: Secondary | ICD-10-CM | POA: Diagnosis present

## 2017-12-01 DIAGNOSIS — K219 Gastro-esophageal reflux disease without esophagitis: Secondary | ICD-10-CM | POA: Diagnosis present

## 2017-12-01 DIAGNOSIS — Z8546 Personal history of malignant neoplasm of prostate: Secondary | ICD-10-CM | POA: Diagnosis not present

## 2017-12-01 DIAGNOSIS — K5732 Diverticulitis of large intestine without perforation or abscess without bleeding: Secondary | ICD-10-CM

## 2017-12-01 DIAGNOSIS — D631 Anemia in chronic kidney disease: Secondary | ICD-10-CM | POA: Diagnosis present

## 2017-12-01 DIAGNOSIS — N3289 Other specified disorders of bladder: Secondary | ICD-10-CM | POA: Diagnosis not present

## 2017-12-01 DIAGNOSIS — Z961 Presence of intraocular lens: Secondary | ICD-10-CM | POA: Diagnosis present

## 2017-12-01 DIAGNOSIS — Z992 Dependence on renal dialysis: Secondary | ICD-10-CM

## 2017-12-01 DIAGNOSIS — K5792 Diverticulitis of intestine, part unspecified, without perforation or abscess without bleeding: Secondary | ICD-10-CM | POA: Diagnosis present

## 2017-12-01 DIAGNOSIS — Z7901 Long term (current) use of anticoagulants: Secondary | ICD-10-CM

## 2017-12-01 DIAGNOSIS — Z9841 Cataract extraction status, right eye: Secondary | ICD-10-CM | POA: Diagnosis not present

## 2017-12-01 DIAGNOSIS — N2581 Secondary hyperparathyroidism of renal origin: Secondary | ICD-10-CM | POA: Diagnosis present

## 2017-12-01 DIAGNOSIS — I2510 Atherosclerotic heart disease of native coronary artery without angina pectoris: Secondary | ICD-10-CM | POA: Diagnosis present

## 2017-12-01 DIAGNOSIS — Z79899 Other long term (current) drug therapy: Secondary | ICD-10-CM

## 2017-12-01 DIAGNOSIS — Z85528 Personal history of other malignant neoplasm of kidney: Secondary | ICD-10-CM

## 2017-12-01 DIAGNOSIS — R309 Painful micturition, unspecified: Secondary | ICD-10-CM

## 2017-12-01 DIAGNOSIS — N186 End stage renal disease: Secondary | ICD-10-CM | POA: Diagnosis not present

## 2017-12-01 DIAGNOSIS — I255 Ischemic cardiomyopathy: Secondary | ICD-10-CM | POA: Diagnosis present

## 2017-12-01 DIAGNOSIS — Z87891 Personal history of nicotine dependence: Secondary | ICD-10-CM | POA: Diagnosis not present

## 2017-12-01 DIAGNOSIS — Z8249 Family history of ischemic heart disease and other diseases of the circulatory system: Secondary | ICD-10-CM

## 2017-12-01 LAB — CBC WITH DIFFERENTIAL/PLATELET
BASOS PCT: 0 %
Basophils Absolute: 0 10*3/uL (ref 0–0.1)
Eosinophils Absolute: 0.1 10*3/uL (ref 0–0.7)
Eosinophils Relative: 1 %
HEMATOCRIT: 32.9 % — AB (ref 40.0–52.0)
HEMOGLOBIN: 10.8 g/dL — AB (ref 13.0–18.0)
LYMPHS ABS: 1.5 10*3/uL (ref 1.0–3.6)
Lymphocytes Relative: 13 %
MCH: 29.9 pg (ref 26.0–34.0)
MCHC: 32.7 g/dL (ref 32.0–36.0)
MCV: 91.4 fL (ref 80.0–100.0)
MONOS PCT: 11 %
Monocytes Absolute: 1.2 10*3/uL — ABNORMAL HIGH (ref 0.2–1.0)
NEUTROS ABS: 8.8 10*3/uL — AB (ref 1.4–6.5)
NEUTROS PCT: 75 %
Platelets: 277 10*3/uL (ref 150–440)
RBC: 3.6 MIL/uL — AB (ref 4.40–5.90)
RDW: 17.9 % — ABNORMAL HIGH (ref 11.5–14.5)
WBC: 11.6 10*3/uL — AB (ref 3.8–10.6)

## 2017-12-01 LAB — BASIC METABOLIC PANEL
ANION GAP: 14 (ref 5–15)
BUN: 20 mg/dL (ref 6–20)
CHLORIDE: 94 mmol/L — AB (ref 101–111)
CO2: 27 mmol/L (ref 22–32)
Calcium: 8.4 mg/dL — ABNORMAL LOW (ref 8.9–10.3)
Creatinine, Ser: 4.48 mg/dL — ABNORMAL HIGH (ref 0.61–1.24)
GFR calc non Af Amer: 12 mL/min — ABNORMAL LOW (ref >60)
GFR, EST AFRICAN AMERICAN: 14 mL/min — AB (ref >60)
Glucose, Bld: 102 mg/dL — ABNORMAL HIGH (ref 65–99)
Potassium: 4.7 mmol/L (ref 3.5–5.1)
Sodium: 135 mmol/L (ref 135–145)

## 2017-12-01 LAB — MAGNESIUM: Magnesium: 2 mg/dL (ref 1.7–2.4)

## 2017-12-01 LAB — MRSA PCR SCREENING: MRSA by PCR: NEGATIVE

## 2017-12-01 LAB — PHOSPHORUS: PHOSPHORUS: 3.6 mg/dL (ref 2.5–4.6)

## 2017-12-01 MED ORDER — PIPERACILLIN-TAZOBACTAM 3.375 G IVPB
3.3750 g | Freq: Two times a day (BID) | INTRAVENOUS | Status: DC
  Administered 2017-12-01 – 2017-12-03 (×4): 3.375 g via INTRAVENOUS
  Filled 2017-12-01 (×4): qty 50

## 2017-12-01 MED ORDER — DIPHENHYDRAMINE HCL 25 MG PO CAPS: 25.0000 mg | ORAL_CAPSULE | Freq: Every evening | ORAL | Status: DC | PRN

## 2017-12-01 MED ORDER — ONDANSETRON HCL 4 MG/2ML IJ SOLN: 4.0000 mg | Freq: Four times a day (QID) | INTRAMUSCULAR | Status: DC | PRN

## 2017-12-01 MED ORDER — OXYCODONE HCL 5 MG PO TABS
5.0000 mg | ORAL_TABLET | ORAL | Status: DC | PRN
  Administered 2017-12-03 (×2): 10 mg via ORAL
  Filled 2017-12-01 (×2): qty 2

## 2017-12-01 MED ORDER — PANTOPRAZOLE SODIUM 40 MG PO TBEC
40.0000 mg | DELAYED_RELEASE_TABLET | Freq: Every day | ORAL | Status: DC
  Administered 2017-12-02 – 2017-12-03 (×2): 40 mg via ORAL
  Filled 2017-12-01 (×2): qty 1

## 2017-12-01 MED ORDER — ALBUTEROL SULFATE (2.5 MG/3ML) 0.083% IN NEBU: 2.5000 mg | INHALATION_SOLUTION | RESPIRATORY_TRACT | Status: DC | PRN

## 2017-12-01 MED ORDER — ACETAMINOPHEN 500 MG PO TABS
1000.0000 mg | ORAL_TABLET | Freq: Four times a day (QID) | ORAL | Status: DC | PRN
  Administered 2017-12-02: 1000 mg via ORAL

## 2017-12-01 MED ORDER — METOPROLOL SUCCINATE ER 25 MG PO TB24
12.5000 mg | ORAL_TABLET | Freq: Every day | ORAL | Status: DC
  Administered 2017-12-01: 12.5 mg via ORAL
  Filled 2017-12-01: qty 1

## 2017-12-01 MED ORDER — AMIODARONE HCL 200 MG PO TABS
200.0000 mg | ORAL_TABLET | Freq: Two times a day (BID) | ORAL | Status: DC
  Administered 2017-12-01 – 2017-12-03 (×4): 200 mg via ORAL
  Filled 2017-12-01 (×4): qty 1

## 2017-12-01 MED ORDER — SODIUM CHLORIDE 0.9 % IV SOLN
INTRAVENOUS | Status: DC
  Administered 2017-12-02 (×2): via INTRAVENOUS

## 2017-12-01 MED ORDER — SODIUM CHLORIDE 0.9 % IV SOLN
Freq: Once | INTRAVENOUS | Status: AC
  Administered 2017-12-01: 19:00:00 via INTRAVENOUS

## 2017-12-01 MED ORDER — DEXTROMETHORPHAN POLISTIREX ER 30 MG/5ML PO SUER
30.0000 mg | Freq: Every evening | ORAL | Status: DC | PRN
  Filled 2017-12-01: qty 5

## 2017-12-01 MED ORDER — MONTELUKAST SODIUM 10 MG PO TABS
10.0000 mg | ORAL_TABLET | Freq: Every evening | ORAL | Status: DC
  Administered 2017-12-01: 10 mg via ORAL
  Filled 2017-12-01: qty 1

## 2017-12-01 MED ORDER — ONDANSETRON 4 MG PO TBDP: 4.0000 mg | ORAL_TABLET | Freq: Four times a day (QID) | ORAL | Status: DC | PRN

## 2017-12-01 MED ORDER — CALCIUM ACETATE 667 MG PO CAPS
1334.0000 mg | ORAL_CAPSULE | Freq: Three times a day (TID) | ORAL | Status: DC
  Administered 2017-12-03: 1334 mg via ORAL
  Filled 2017-12-01 (×7): qty 2

## 2017-12-01 MED ORDER — HYDROMORPHONE HCL 1 MG/ML IJ SOLN: 0.5000 mg | INTRAMUSCULAR | Status: DC | PRN

## 2017-12-01 MED ORDER — POLYETHYLENE GLYCOL 3350 17 G PO PACK: 17.0000 g | PACK | Freq: Every day | ORAL | Status: DC | PRN

## 2017-12-01 MED ORDER — DM-GUAIFENESIN ER 30-600 MG PO TB12: 1.0000 | ORAL_TABLET | Freq: Every evening | ORAL | Status: DC | PRN

## 2017-12-01 MED ORDER — UMECLIDINIUM-VILANTEROL 62.5-25 MCG/INH IN AEPB
1.0000 | INHALATION_SPRAY | Freq: Every day | RESPIRATORY_TRACT | Status: DC
  Administered 2017-12-02 – 2017-12-03 (×2): 1 via RESPIRATORY_TRACT
  Filled 2017-12-01: qty 14

## 2017-12-01 MED ORDER — LEVOTHYROXINE SODIUM 50 MCG PO TABS
25.0000 ug | ORAL_TABLET | Freq: Every day | ORAL | Status: DC
  Administered 2017-12-02 – 2017-12-03 (×2): 25 ug via ORAL
  Filled 2017-12-01 (×2): qty 1

## 2017-12-01 MED ORDER — GUAIFENESIN ER 600 MG PO TB12: 600.0000 mg | ORAL_TABLET | Freq: Every evening | ORAL | Status: DC | PRN

## 2017-12-01 MED ORDER — PANTOPRAZOLE SODIUM 40 MG IV SOLR
40.0000 mg | Freq: Every day | INTRAVENOUS | Status: DC
  Administered 2017-12-01 – 2017-12-02 (×2): 40 mg via INTRAVENOUS
  Filled 2017-12-01 (×2): qty 40

## 2017-12-01 NOTE — Consult Note (Signed)
I have been asked to see the patient by Dr. Olean Ree, for evaluation and management of possible colovesical fistula.  History of present illness: 76 year old male with end-stage renal disease on dialysis with a history of prostate cancer treated with radiation more than 10 years ago and renal cell carcinoma that was treated with cryoablation and managed by Dr. Jacqlyn Larsen was admitted to the hospital from the general surgery clinic for a perivesical abscess.  CT scan demonstrates an abscess likely resulting from perforated diverticulum resting adjacent to the bladder dome.  There may be some air within the patient's bladder although not definitively, but which would suggest a connection.  Interestingly, the patient has no symptoms.  He denies any abdominal pain.  He denies any nausea.  He denies any loss of appetite.  Further, he denies any fevers or chills.  He denies any issues with his bowels.  However, he does endorse 3 months of intermittent straw-colored urine with associated dysuria.  This is not all the time.  The patient voids about 3 times a week and has dysuria once in a while.  When he has dysuria also notes brown or discolored urine.  He does not have a history of recurrent infections.  He has had cystoscopy by Dr. Jacqlyn Larsen many years ago for hematuria, because her etiology undefined.  Around the time of the onset of his dysuria and intermittent hematuria he developed a severe lower extremity rash which progressed to his lower abdomen.  He was diagnosed with a IgA vasculitis treated with prednisone.  I have independently reviewed the patient's CT scan  Currently, the patient is resting comfortably.  He is without any complaints.  Review of systems: A 12 point comprehensive review of systems was obtained and is negative unless otherwise stated in the history of present illness.  Patient Active Problem List   Diagnosis Date Noted  . 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 Atypical atrial flutter ([place])   . Sepsis ([place]) 10/19/2017  . Respiratory failure ([place]) 06/20/2017  . COPD exacerbation ([place]) 06/20/2017  . Atrial flutter with rapid ventricular response ([place]) 06/17/2017  . End stage renal disease ([place]) 11/08/2016  . Complication of vascular access for dialysis 11/08/2016  . Essential hypertension, benign 11/08/2016  . Hypercholesterolemia 11/08/2016    Current Facility-Administered Medications on File Prior to Encounter  Medication Dose Route Frequency Provider Last Rate Last Dose  . 0.9 %  sodium chloride infusion    Continuous PRN Johnna Acosta, CRNA       Current Outpatient Medications on File Prior to Encounter  Medication Sig Dispense Refill  . acetaminophen (TYLENOL) 500 MG tablet Take 1,000 mg by mouth daily as needed for moderate pain or headache.    . albuterol (PROVENTIL HFA;VENTOLIN HFA) 108 (90 BASE) MCG/ACT inhaler Inhale 2 puffs into the lungs every 4 (four) hours as needed for wheezing or shortness of breath.    [name] amiodarone (PACERONE) 200 MG tablet Take 400 mg (2 tab) by mouth twice a day for 1 week  then taken 200 mg (1 tab) by mouth  Twice a day (Patient taking differently: Take 200 mg by mouth 2 (two) times daily. ) 90 tablet 0  . apixaban (ELIQUIS) 5 MG TABS tablet Take 1 tablet (5 mg total) by mouth 2 (two) times daily. 60 tablet 2  . atorvastatin (LIPITOR) 10 MG tablet Take 10 mg by mouth every evening.     [name] azelastine (OPTIVAR) 0.05 % ophthalmic solution  Place 1 drop into both eyes 2 (two) times daily as needed (allergies).    . calcium acetate (PHOSLO) 667 MG capsule Take 1,334 mg by mouth 3 (three) times daily with meals.    . Cholecalciferol (VITAMIN D3) 2000 units TABS Take 2,000 Units by mouth daily.     [name] dextromethorphan-guaiFENesin (MUCINEX DM) 30-600 MG 12hr tablet Take 1 tablet by mouth at bedtime as needed for cough.    . DiphenhydrAMINE HCl, Sleep, (ZZZQUIL PO) Take 25 mg by mouth at bedtime as needed  (sleep).     . febuxostat (ULORIC) 40 MG tablet Take 40 mg by mouth every evening.     [name] levothyroxine (SYNTHROID, LEVOTHROID) 25 MCG tablet Take 1 tablet (25 mcg total) by mouth daily before breakfast. 30 tablet 5  . metoprolol succinate (TOPROL-XL) 25 MG 24 hr tablet Take 0.5 tablets (12.5 mg total) by mouth daily. Take with or immediately following a meal. (Patient taking differently: Take 12.5 mg by mouth daily after supper. Take with or immediately following a meal.) 90 tablet 3  . montelukast (SINGULAIR) 10 MG tablet Take 10 mg by mouth every evening.     . multivitamin (RENA-VIT) TABS tablet Take 1 tablet by mouth daily.    . pantoprazole (PROTONIX) 40 MG tablet Take 40 mg by mouth daily before breakfast.     . umeclidinium-vilanterol (ANORO ELLIPTA) 62.5-25 MCG/INH AEPB Inhale 1 puff into the lungs daily.      Past Medical History:  Diagnosis Date  . Arthritis   . Atrial flutter ([place])    a. s/p DCCV 07/2017 briefly successful; b. amio and warfarin; c. CHADS2VASc => 5 (CHF, HTN, age x 2, vascular disease);  [name] CAD (coronary artery disease)    a. remote inferior MI s/p stenting; b. Myoview in 10/2016 with normal perfusion with an EF of 46%  . COPD (chronic obstructive pulmonary disease) ([place])   . ESRD (end stage renal disease) on dialysis ([place])    a. HD MWF  . GERD (gastroesophageal reflux disease)   . Hyperlipidemia   . Hypertension   . Ischemic cardiomyopathy    a. TTE 05/2017: EF 35-40%, unable to exlcude RWMA, mild MR, mildly dilated RV  . Prostate cancer ([place])   . Shortness of breath dyspnea     Past Surgical History:  Procedure Laterality Date  . A/V SHUNT INTERVENTION Left 10/24/2017   Procedure: A/V SHUNT INTERVENTION;  Surgeon: Algernon Huxley, MD;  Location: [place] [facility];  Service: Cardiovascular;  Laterality: Left;  . CARDIOVERSION N/A 07/26/2017   Procedure: Cardioversion;  Surgeon: Corey Skains, MD;  Location: [facility];  Service: Cardiovascular;  Laterality:  N/A;  . CARDIOVERSION N/A 08/18/2017   Procedure: CARDIOVERSION;  Surgeon: Corey Skains, MD;  Location: [facility];  Service: Cardiovascular;  Laterality: N/A;  . CARDIOVERSION N/A 10/21/2017   Procedure: CARDIOVERSION;  Surgeon: Wellington Hampshire, MD;  Location: [facility];  Service: Cardiovascular;  Laterality: N/A;  . CARDIOVERSION N/A 11/25/2017   Procedure: CARDIOVERSION;  Surgeon: Wellington Hampshire, MD;  Location: [facility];  Service: Cardiovascular;  Laterality: N/A;  . CATARACT EXTRACTION W/PHACO Right 08/20/2015   Procedure: CATARACT EXTRACTION PHACO AND INTRAOCULAR LENS PLACEMENT ([place]);  Surgeon: Leandrew Koyanagi, MD;  Location: [place];  Service: Ophthalmology;  Laterality: Right;  . CATARACT EXTRACTION W/PHACO Left 09/10/2015   Procedure: CATARACT EXTRACTION PHACO AND INTRAOCULAR LENS PLACEMENT (IOC);  Surgeon: Leandrew Koyanagi, MD;  Location: [place];  Service: Ophthalmology;  Laterality:  Left;  . CORONARY STENT PLACEMENT    . 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 PERIPHERAL VASCULAR CATHETERIZATION Left 05/20/2015   Procedure: A/V Shuntogram/Fistulagram;  Surgeon: Katha Cabal, MD;  Location: [place] [facility];  Service: Cardiovascular;  Laterality: Left;  . PERIPHERAL VASCULAR CATHETERIZATION N/A 05/20/2015   Procedure: A/V Shunt Intervention;  Surgeon: Katha Cabal, MD;  Location: [place] [facility];  Service: Cardiovascular;  Laterality: N/A;  . PERIPHERAL VASCULAR CATHETERIZATION Left 08/05/2015   Procedure: A/V Shuntogram/Fistulagram;  Surgeon: Katha Cabal, MD;  Location: [place] [facility];  Service: Cardiovascular;  Laterality: Left;  . PERIPHERAL VASCULAR CATHETERIZATION N/A 08/05/2015   Procedure: A/V Shunt Intervention;  Surgeon: Katha Cabal, MD;  Location: [facility];  Service: Cardiovascular;  Laterality: N/A;  . PERIPHERAL VASCULAR CATHETERIZATION Left 01/06/2016   Procedure: A/V  Shuntogram/Fistulagram;  Surgeon: Katha Cabal, MD;  Location: [place] [facility];  Service: Cardiovascular;  Laterality: Left;  . PERIPHERAL VASCULAR CATHETERIZATION N/A 01/06/2016   Procedure: A/V Shunt Intervention;  Surgeon: Katha Cabal, MD;  Location: [facility];  Service: Cardiovascular;  Laterality: N/A;  . PERIPHERAL VASCULAR CATHETERIZATION N/A 12/13/2016   Procedure: Dialysis/Perma Catheter Removal;  Surgeon: Algernon Huxley, MD;  Location: [facility];  Service: Cardiovascular;  Laterality: N/A;    Social History   Tobacco Use  . Smoking status: Former Smoker    Packs/day: 1.00    Years: 30.00    Pack years: 30.00    Types: Cigarettes    Last attempt to quit: 05/19/2009    Years since quitting: 8.5  . Smokeless tobacco: Never Used  Substance Use Topics  . Alcohol use: No  . Drug use: No    Family History  Problem Relation Age of Onset  . Liver disease Mother   . Heart disease Mother   . Hypertension Mother   . Pancreatic cancer Father     PE: Vitals:   12/01/17 1625  BP: (!) 92/55  Pulse: (!) 106  Resp: 17  Temp: 98.6 F (37 C)  TempSrc: Oral  SpO2: 95%   Patient appears to be in no acute distress  patient is alert and oriented x3 Atraumatic normocephalic head No cervical or supraclavicular lymphadenopathy appreciated No increased work of breathing, no audible wheezes/rhonchi Regular sinus rhythm/rate Abdomen is soft, nontender, nondistended Lower extremities are symmetric without appreciable edema Grossly neurologically intact No identifiable skin lesions  Recent Labs    12/01/17 1706  WBC 11.6*  HGB 10.8*  HCT 32.9*   Recent Labs    12/01/17 1706  NA 135  K 4.7  CL 94*  CO2 27  GLUCOSE 102*  BUN 20  CREATININE 4.48*  CALCIUM 8.4*   No results for input(s): LABPT, INR in the last 72 hours. No results for input(s): LABURIN in the last 72 hours. Results for orders placed or performed during the hospital  encounter of 12/01/17  MRSA PCR Screening     Status: None   Collection Time: 12/01/17  6:57 PM  Result Value Ref Range Status   MRSA by PCR NEGATIVE NEGATIVE Final    Comment:        The GeneXpert MRSA Assay (FDA approved for NASAL specimens only), is one component of a comprehensive MRSA colonization surveillance program. It is not intended to diagnose MRSA infection nor to guide or monitor treatment for MRSA infections.     Imaging: And  discussed it with him.  The findings are as described above.  Imp/Recommendations: The patient has intermittent dysuria with findings on his CT scan today for a peri-vesicle abscess.  This is likely from a perforated diverticulum although he is asymptomatic from a GI perspective.  His history is also not suggestive of a colovesical fistula given that he does not have a history of recurrent urinary tract infections.  On the lung windows of the CT scan looking at the bladder there is no air identified.  I do not think the CT scan shows a fistula definitively.  Would recommend draining the abscess.  The patient should also undergo cystoscopy at some point for further evaluation of his hematuria which would also help rule out fistula.  This can be performed as an outpatient, depending on the patient's hospital course and management of his abscess.   Louis Meckel W

## 2017-12-01 NOTE — Patient Instructions (Signed)
We are direct admitting you to [facility] from the office today. We need for you to go directly to Admitting and from there someone will escort you to your room.

## 2017-12-01 NOTE — Progress Notes (Signed)
12/01/2017  Reason for Visit:  Diverticulitis with abscess  Referring Provider:  Rhona Leavens, MD  History of Present Illness: Maurice Peterson is a 76 y.o. male referred to our office for evaluation of diverticulitis with abscess.  The patient had just had an outpatient CT scan on 11/30/17 for evaluation for possible intra-abdominal tumor.  He has a history of prostate cancer and renal cell carcinoma.  He also reports that he has been having over the past few months, intermittent episodes of brown color urine.  The concern initially was that this could be related to tumor, and hence CT scan was ordered.  Patient was recently hospitalized in October with atrial fibrillation that required cardioversion, as well as a malfunctioning LUE AV fistula that required fistulagram with angioplasty.  He also has a recent diagnosis of IgA vasculitis and required steroid course that he finished about a month ago.  From the abdominal standpoint, he denies any symptoms.  Denies any fevers, chills, chest pain, shortness of breath, nausea, vomiting, abdominal pain, dysuria, hematuria, or blood in the stool.  CT scan showed diverticulitis with a 3.8 x 2.8 abscess abutting the bladder, with concerns for possible colovesicular fistula.  Past Medical History: Past Medical History:  Diagnosis Date  . Arthritis   . Atrial flutter ([place])    a. s/p DCCV 07/2017 briefly successful; b. amio and warfarin; c. CHADS2VASc => 5 (CHF, HTN, age x 2, vascular disease);  [name] CAD (coronary artery disease)    a. remote inferior MI s/p stenting; b. Myoview in 10/2016 with normal perfusion with an EF of 46%  . COPD (chronic obstructive pulmonary disease) ([place])   . ESRD (end stage renal disease) on dialysis ([place])    a. HD MWF  . GERD (gastroesophageal reflux disease)   . Hyperlipidemia   . Hypertension   . Ischemic cardiomyopathy    a. TTE 05/2017: EF 35-40%, unable to exlcude RWMA, mild MR, mildly dilated RV  . Prostate cancer ([place])   .  Shortness of breath dyspnea      Past Surgical History: Past Surgical History:  Procedure Laterality Date  . A/V SHUNT INTERVENTION Left 10/24/2017   Procedure: A/V SHUNT INTERVENTION;  Surgeon: Algernon Huxley, MD;  Location: [facility] [facility];  Service: Cardiovascular;  Laterality: Left;  . CARDIOVERSION N/A 07/26/2017   Procedure: Cardioversion;  Surgeon: Corey Skains, MD;  Location: [facility];  Service: Cardiovascular;  Laterality: N/A;  . CARDIOVERSION N/A 08/18/2017   Procedure: CARDIOVERSION;  Surgeon: Corey Skains, MD;  Location: [facility];  Service: Cardiovascular;  Laterality: N/A;  . CARDIOVERSION N/A 10/21/2017   Procedure: CARDIOVERSION;  Surgeon: Wellington Hampshire, MD;  Location: [facility];  Service: Cardiovascular;  Laterality: N/A;  . CARDIOVERSION N/A 11/25/2017   Procedure: CARDIOVERSION;  Surgeon: Wellington Hampshire, MD;  Location: [facility];  Service: Cardiovascular;  Laterality: N/A;  . CATARACT EXTRACTION W/PHACO Right 08/20/2015   Procedure: CATARACT EXTRACTION PHACO AND INTRAOCULAR LENS PLACEMENT ([place]  Surgeon: Leandrew Koyanagi, MD;  Location: [place];  Service: Ophthalmology;  Laterality: Right;  . CATARACT EXTRACTION W/PHACO Left 09/10/2015   Procedure: CATARACT EXTRACTION PHACO AND INTRAOCULAR LENS PLACEMENT (I[facility]);  Surgeon: Leandrew Koyanagi, MD;  Location: [place];  Service: Ophthalmology;  Laterality: Left;  . CORONARY STENT PLACEMENT    . 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 PERIPHERAL VASCULAR CATHETERIZATION Left 05/20/2015   Procedure: A/V Shuntogram/Fistulagram;  Surgeon: Katha Cabal, MD;  Location: [place] [facility];  Service: Cardiovascular;  Laterality: Left;  . PERIPHERAL VASCULAR CATHETERIZATION N/A 05/20/2015   Procedure: A/V Shunt Intervention;  Surgeon: Katha Cabal, MD;  Location: [place] [facility];  Service: Cardiovascular;  Laterality: N/A;  . PERIPHERAL VASCULAR CATHETERIZATION  Left 08/05/2015   Procedure: A/V Shuntogram/Fistulagram;  Surgeon: Katha Cabal, MD;  Location: [place] [facility];  Service: Cardiovascular;  Laterality: Left;  . PERIPHERAL VASCULAR CATHETERIZATION N/A 08/05/2015   Procedure: A/V Shunt Intervention;  Surgeon: Katha Cabal, MD;  Location: [place] [facility];  Service: Cardiovascular;  Laterality: N/A;  . PERIPHERAL VASCULAR CATHETERIZATION Left 01/06/2016   Procedure: A/V Shuntogram/Fistulagram;  Surgeon: Katha Cabal, MD;  Location: [facility] [facility];  Service: Cardiovascular;  Laterality: Left;  . PERIPHERAL VASCULAR CATHETERIZATION N/A 01/06/2016   Procedure: A/V Shunt Intervention;  Surgeon: Katha Cabal, MD;  Location: [facility];  Service: Cardiovascular;  Laterality: N/A;  . PERIPHERAL VASCULAR CATHETERIZATION N/A 12/13/2016   Procedure: Dialysis/Perma Catheter Removal;  Surgeon: Algernon Huxley, MD;  Location: [facility] [facility];  Service: Cardiovascular;  Laterality: N/A;    Home Medications: Prior to Admission medications   Medication Sig Start Date End Date Taking? Authorizing Provider  acetaminophen (TYLENOL) 500 MG tablet Take 1,000 mg by mouth daily as needed for moderate pain or headache.   Yes [provider]  albuterol (PROVENTIL HFA;VENTOLIN HFA) 108 (90 BASE) MCG/ACT inhaler Inhale 2 puffs into the lungs every 4 (four) hours as needed for wheezing or shortness of breath.   Yes [provider]  amiodarone (PACERONE) 200 MG tablet Take 400 mg (2 tab) by mouth twice a day for 1 week  then taken 200 mg (1 tab) by mouth  Twice a day Patient taking differently: Take 200 mg by mouth 2 (two) times daily.  10/21/17  Yes Fritzi Mandes, MD  apixaban (ELIQUIS) 5 MG TABS tablet Take 1 tablet (5 mg total) by mouth 2 (two) times daily. 10/26/17  Yes Fritzi Mandes, MD  atorvastatin (LIPITOR) 10 MG tablet Take 10 mg by mouth every evening.    Yes [provider]  azelastine (OPTIVAR) 0.05 %  ophthalmic solution Place 1 drop into both eyes 2 (two) times daily as needed (allergies).   Yes [provider]  calcium acetate (PHOSLO) 667 MG capsule Take 1,334 mg by mouth 3 (three) times daily with meals.   Yes [provider]  Cholecalciferol (VITAMIN D3) 2000 units TABS Take 2,000 Units by mouth daily.    Yes [provider]  dextromethorphan-guaiFENesin (MUCINEX DM) 30-600 MG 12hr tablet Take 1 tablet by mouth at bedtime as needed for cough.   Yes [provider]  DiphenhydrAMINE HCl, Sleep, (ZZZQUIL PO) Take 25 mg by mouth at bedtime as needed (sleep).    Yes [provider]  febuxostat (ULORIC) 40 MG tablet Take 40 mg by mouth every evening.    Yes [provider]  levothyroxine (SYNTHROID, LEVOTHROID) 25 MCG tablet Take 1 tablet (25 mcg total) by mouth daily before breakfast. 11/22/17  Yes Deboraha Sprang, MD  metoprolol succinate (TOPROL-XL) 25 MG 24 hr tablet Take 0.5 tablets (12.5 mg total) by mouth daily. Take with or immediately following a meal. Patient taking differently: Take 12.5 mg by mouth daily after supper. Take with or immediately following a meal. 11/22/17 02/20/18 Yes Deboraha Sprang, MD  montelukast (SINGULAIR) 10 MG tablet Take 10 mg by mouth every evening.  Yes [provider]  multivitamin (RENA-VIT) TABS tablet Take 1 tablet by mouth daily.   Yes [provider]  pantoprazole (PROTONIX) 40 MG tablet Take 40 mg by mouth daily before breakfast.    Yes [provider]  umeclidinium-vilanterol (ANORO ELLIPTA) 62.5-25 MCG/INH AEPB Inhale 1 puff into the lungs daily.   Yes [provider]    Allergies: Allergies  Allergen Reactions  . Nsaids Nausea And Vomiting    Anti-inflammatories.  . Amoxicillin-Pot Clavulanate Nausea And Vomiting    Has patient had a PCN reaction causing immediate rash, facial/tongue/throat swelling, SOB or lightheadedness with hypotension: No Has patient  had a PCN reaction causing severe rash involving mucus membranes or skin necrosis: No Has patient had a PCN reaction that required hospitalization:Yes-vomiting blood Has patient had a PCN reaction occurring within the last 10 years: Yes If all of the above answers are "NO", then may proceed with Cephalosporin use.     Social History:  reports that he quit smoking about 8 years ago. His smoking use included cigarettes. He has a 30.00 pack-year smoking history. he has never used smokeless tobacco. He reports that he does not drink alcohol or use drugs.   Family History: Family History  Problem Relation Age of Onset  . Liver disease Mother   . Heart disease Mother   . Hypertension Mother   . Pancreatic cancer Father     Review of Systems: Review of Systems  Constitutional: Negative for chills and fever.  HENT: Negative for hearing loss.   Eyes: Negative for blurred vision.  Respiratory: Negative for shortness of breath.   Cardiovascular: Negative for chest pain.  Gastrointestinal: Negative for abdominal pain, blood in stool, constipation, diarrhea, nausea and vomiting.  Genitourinary: Negative for dysuria.  Musculoskeletal: Negative for myalgias.  Skin: Negative for rash.  Neurological: Negative for dizziness.  Psychiatric/Behavioral: Negative for depression.  All other systems reviewed and are negative.   Physical Exam BP 94/65   Pulse (!) 106   Temp 98.3 F (36.8 C) (Oral)   Ht 5\' 9"  (1.753 m)   Wt 111.1 kg (245 lb)   BMI 36.18 kg/m  CONSTITUTIONAL: No acute distress HEENT:  Normocephalic, atraumatic, extraocular motion intact. NECK: Trachea is midline, and there is no jugular venous distension.  RESPIRATORY:  Lungs are clear bilaterally. CARDIOVASCULAR: Irregular rhythm, low grade tachycardia. No murmurs. GI: The abdomen is soft, obese, nondistended, nontender to palpation. MUSCULOSKELETAL:  Normal muscle strength and tone in all four extremities.  No peripheral  edema or cyanosis.  Left upper extremity AV fistula in place. SKIN: Skin turgor is normal. There are no pathologic skin lesions.  NEUROLOGIC:  Motor and sensation is grossly normal.  Cranial nerves are grossly intact. PSYCH:  Alert and oriented to person, place and time. Affect is normal.  Laboratory Analysis: No results found for this or any previous visit (from the past 24 hour(s)).  Imaging: CT scan abd/pelvis on 12/5 shows extensive diverticular disease over sigmoid colon, with a focus of diverticulitis with a 3.8 x 2.8 cm abscess that abuts the posterior wall of the bladder.    Assessment and Plan: This is a 76 y.o. male who presents with incidental finding of diverticulitis with abscess and concern for possible colovesicular fistula.  I have independently viewed the patient's imaging study.  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  There is a bubble of  gas on top portion of bladder.  Discussed with the patient that we should admit him to the hospital to the surgical service, and start him on IV antibiotics and discuss with radiology the possibility of draining the abscess.  His CT scan from outside facility will be imported into our system for easier access.  Will consult nephrology to continue the patient's dialysis treatments and urology for evaluation for a possible colovesicular fistula.  Will also consult the hospitalist team for assistance in managing the patient's comorbidities.  Case has been discussed with Dr. Adonis Huguenin who is the daytime surgeon on call.   Melvyn Neth, [place]

## 2017-12-01 NOTE — Consult Note (Signed)
[name] NAME: Maurice Peterson    MR#:  [id number]  DATE OF BIRTH:  June 24, 1941  DATE OF ADMISSION:  12/01/2017  PRIMARY CARE PHYSICIAN: Baxter Hire, MD   CONSULT REQUESTING/REFERRING PHYSICIAN: Dr. Hampton Abbot  REASON FOR CONSULT: CHF, end-stage renal disease, atrial fibrillation  CHIEF COMPLAINT:  No chief complaint on file.   HISTORY OF PRESENT ILLNESS:  Maurice Peterson  is a 76 y.o. 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  Patient does not have any nausea, vomiting, diarrhea, constipation, abdominal pain, fever.  He recently had diffuse rash and had biopsy with dermatology which showed vasculitis.  He had CT scan of the chest and abdomen to look for possible etiology.  He is being admitted for IV antibiotics and percutaneous drain.  CT scan also raises concern for possible colovesicular fistula.  He has chronic shortness of breath which is unchanged.  No chest pain on ambulation.  He had cardioversion for his atrial fibrillation in August 2018 but reverted back to his atrial fibrillation in 2 days.  This was attempted in November but moderate sedation was thought to be high risk in this patient by cardiology.  Presently he is on amiodarone, Toprol and Eliquis.  Follows with Dr. Caryl Comes of electrophysiology.  His blood pressures supposedly runs low with systolic being in the 24M-35T at times.  PAST MEDICAL HISTORY:   Past Medical History:  Diagnosis Date  . Arthritis   . Atrial flutter ([place])    a. s/p DCCV 07/2017 briefly successful; b. amio and warfarin; c. CHADS2VASc => 5 (CHF, HTN, age x 2, vascular disease);  [name] CAD (coronary artery disease)     a. remote inferior MI s/p stenting; b. Myoview in 10/2016 with normal perfusion with an EF of 46%  . COPD (chronic obstructive pulmonary disease) ([place])   . ESRD (end stage renal disease) on dialysis ([place])    a. HD MWF  . GERD (gastroesophageal reflux disease)   . Hyperlipidemia   . Hypertension   . Ischemic cardiomyopathy    a. TTE 05/2017: EF 35-40%, unable to exlcude RWMA, mild MR, mildly dilated RV  . Prostate cancer ([place])   . Shortness of breath dyspnea     PAST SURGICAL HISTOIRY:   Past Surgical History:  Procedure Laterality Date  . A/V SHUNT INTERVENTION Left 10/24/2017   Procedure: A/V SHUNT INTERVENTION;  Surgeon: Algernon Huxley, MD;  Location: [facility];  Service: Cardiovascular;  Laterality: Left;  . CARDIOVERSION N/A 07/26/2017   Procedure: Cardioversion;  Surgeon: Corey Skains, MD;  Location: [facility];  Service: Cardiovascular;  Laterality: N/A;  . CARDIOVERSION N/A 08/18/2017   Procedure: CARDIOVERSION;  Surgeon: Corey Skains, MD;  Location: [facility];  Service: Cardiovascular;  Laterality: N/A;  . CARDIOVERSION N/A 10/21/2017   Procedure: CARDIOVERSION;  Surgeon: Wellington Hampshire, MD;  Location: [facility];  Service: Cardiovascular;  Laterality: N/A;  . CARDIOVERSION N/A 11/25/2017   Procedure: CARDIOVERSION;  Surgeon: Wellington Hampshire, MD;  Location: [facility];  Service: Cardiovascular;  Laterality: N/A;  . CATARACT EXTRACTION W/PHACO Right 08/20/2015   Procedure: CATARACT EXTRACTION PHACO AND INTRAOCULAR LENS PLACEMENT ([place]);  Surgeon: Leandrew Koyanagi, MD;  Location: [facility];  Service: Ophthalmology;  Laterality: Right;  . CATARACT EXTRACTION W/PHACO Left 09/10/2015   Procedure: CATARACT EXTRACTION PHACO AND INTRAOCULAR LENS PLACEMENT ([facility]);  Surgeon: Leandrew Koyanagi, MD;  Location: [facility];  Service: Ophthalmology;  Laterality: Left;  . CORONARY STENT PLACEMENT    . 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  PERIPHERAL VASCULAR CATHETERIZATION Left 05/20/2015   Procedure: A/V Shuntogram/Fistulagram;  Surgeon: Katha Cabal, MD;  Location: [facility];  Service: Cardiovascular;  Laterality: Left;  . PERIPHERAL VASCULAR CATHETERIZATION N/A 05/20/2015   Procedure: A/V Shunt Intervention;  Surgeon: Katha Cabal, MD;  Location: [facility];  Service: Cardiovascular;  Laterality: N/A;  . PERIPHERAL VASCULAR CATHETERIZATION Left 08/05/2015   Procedure: A/V Shuntogram/Fistulagram;  Surgeon: Katha Cabal, MD;  Location: [facility];  Service: Cardiovascular;  Laterality: Left;  . PERIPHERAL VASCULAR CATHETERIZATION N/A 08/05/2015   Procedure: A/V Shunt Intervention;  Surgeon: Katha Cabal, MD;  Location: [facility];  Service: Cardiovascular;  Laterality: N/A;  . PERIPHERAL VASCULAR CATHETERIZATION Left 01/06/2016   Procedure: A/V Shuntogram/Fistulagram;  Surgeon: Katha Cabal, MD;  Location: [facility];  Service: Cardiovascular;  Laterality: Left;  . PERIPHERAL VASCULAR CATHETERIZATION N/A 01/06/2016   Procedure: A/V Shunt Intervention;  Surgeon: Katha Cabal, MD;  Location: [facility];  Service: Cardiovascular;  Laterality: N/A;  . PERIPHERAL VASCULAR CATHETERIZATION N/A 12/13/2016   Procedure: Dialysis/Perma Catheter Removal;  Surgeon: Algernon Huxley, MD;  Location: [facility];  Service: Cardiovascular;  Laterality: N/A;    SOCIAL HISTORY:   Social History   Tobacco Use  . Smoking status: Former Smoker    Packs/day: 1.00    Years: 30.00    Pack years: 30.00    Types: Cigarettes    Last attempt to quit: 05/19/2009    Years since quitting: 8.5  . Smokeless tobacco: Never Used  Substance Use Topics  . Alcohol use: No    FAMILY HISTORY:   Family History  Problem Relation Age of Onset  . Liver disease Mother   . Heart disease Mother   . Hypertension Mother   . Pancreatic cancer Father     DRUG ALLERGIES:    Allergies  Allergen Reactions  . Nsaids Nausea And Vomiting    Anti-inflammatories.  . Amoxicillin-Pot Clavulanate Nausea And Vomiting    Has patient had a PCN reaction causing immediate rash, facial/tongue/throat swelling, SOB or lightheadedness with hypotension: No Has patient had a PCN reaction causing severe rash involving mucus membranes or skin necrosis: No Has patient had a PCN reaction that required hospitalization:Yes-vomiting blood Has patient had a PCN reaction occurring within the last 10 years: Yes If all of the above answers are "NO", then may proceed with Cephalosporin use.     REVIEW OF SYSTEMS:   ROS  CONSTITUTIONAL: No fever, fatigue or weakness.  EYES: No blurred or double vision.  EARS, NOSE, AND THROAT: No tinnitus or ear pain.  RESPIRATORY: No cough, shortness of breath, wheezing or hemoptysis.  CARDIOVASCULAR: No chest pain, orthopnea, edema.  GASTROINTESTINAL: No nausea, vomiting, diarrhea or abdominal pain.  GENITOURINARY: No dysuria, hematuria.  ENDOCRINE: No polyuria, nocturia,  HEMATOLOGY: No anemia, easy bruising or bleeding SKIN: No rash or lesion. MUSCULOSKELETAL: No joint pain or arthritis.   NEUROLOGIC: No tingling, numbness, weakness.  PSYCHIATRY: No anxiety or depression.   MEDICATIONS AT HOME:   Prior to Admission  medications   Medication Sig Start Date End Date Taking? Authorizing Provider  acetaminophen (TYLENOL) 500 MG tablet Take 1,000 mg by mouth daily as needed for moderate pain or headache.    [provider]  albuterol (PROVENTIL HFA;VENTOLIN HFA) 108 (90 BASE) MCG/ACT inhaler Inhale 2 puffs into the lungs every 4 (four) hours as needed for wheezing or shortness of breath.    [provider]  amiodarone (PACERONE) 200 MG tablet Take 400 mg (2 tab) by mouth twice a day for 1 week  then taken 200 mg (1 tab) by mouth  Twice a day Patient taking differently: Take 200 mg by mouth 2 (two) times daily.  10/21/17   Fritzi Mandes, MD  apixaban (ELIQUIS) 5 MG TABS tablet Take 1 tablet (5 mg total) by mouth 2 (two) times daily. 10/26/17   Fritzi Mandes, MD  atorvastatin (LIPITOR) 10 MG tablet Take 10 mg by mouth every evening.     [provider]  azelastine (OPTIVAR) 0.05 % ophthalmic solution Place 1 drop into both eyes 2 (two) times daily as needed (allergies).    [provider]  calcium acetate (PHOSLO) 667 MG capsule Take 1,334 mg by mouth 3 (three) times daily with meals.    [provider]  Cholecalciferol (VITAMIN D3) 2000 units TABS Take 2,000 Units by mouth daily.     [provider]  dextromethorphan-guaiFENesin (MUCINEX DM) 30-600 MG 12hr tablet Take 1 tablet by mouth at bedtime as needed for cough.    [provider]  DiphenhydrAMINE HCl, Sleep, (ZZZQUIL PO) Take 25 mg by mouth at bedtime as needed (sleep).     [provider]  febuxostat (ULORIC) 40 MG tablet Take 40 mg by mouth every evening.     [provider]  levothyroxine (SYNTHROID, LEVOTHROID) 25 MCG tablet Take 1 tablet (25 mcg total) by mouth daily before breakfast. 11/22/17   Deboraha Sprang, MD  metoprolol succinate (TOPROL-XL) 25 MG 24 hr tablet Take 0.5 tablets (12.5 mg total) by mouth daily. Take with or immediately following a meal. Patient taking differently: Take 12.5 mg by mouth daily after supper. Take with or immediately following a meal. 11/22/17 02/20/18  Deboraha Sprang, MD  montelukast (SINGULAIR) 10 MG tablet Take 10 mg by mouth every evening.     [provider]  multivitamin (RENA-VIT) TABS tablet Take 1 tablet by mouth daily.    [provider]  pantoprazole (PROTONIX) 40 MG tablet Take 40 mg by mouth daily before breakfast.     [provider]  umeclidinium-vilanterol (ANORO ELLIPTA) 62.5-25 MCG/INH AEPB Inhale 1 puff into the lungs daily.    [provider]      VITAL SIGNS:  Blood pressure (!) 92/55, pulse (!) 106, temperature  98.6 F (37 C), temperature source Oral, resp. rate 17, SpO2 95 %.  PHYSICAL EXAMINATION:  GENERAL:  76 y.o.-year-old patient lying in the bed with no acute distress.  Obese EYES: Pupils equal, round, reactive to light and accommodation. No scleral icterus. Extraocular muscles intact.  HEENT: Head atraumatic, normocephalic. Oropharynx and nasopharynx clear.  NECK:  Supple, no jugular venous distention. No thyroid enlargement, no tenderness.  LUNGS: Normal breath sounds bilaterally, no wheezing, rales,rhonchi or crepitation. No use of accessory muscles of respiration.  CARDIOVASCULAR: S1, S2 normal. No murmurs, rubs, or gallops.  ABDOMEN: Soft, nontender, nondistended. Bowel sounds present. No organomegaly or mass.  EXTREMITIES: No pedal edema, cyanosis, or clubbing.  NEUROLOGIC: Cranial nerves II through XII  are intact. Muscle strength 5/5 in all extremities. Sensation intact. Gait not checked.  PSYCHIATRIC: The patient is alert and oriented x 3.  SKIN: No obvious rash, lesion, or ulcer.   LABORATORY PANEL:   CBC Recent Labs  Lab 12/01/17 1706  WBC 11.6*  HGB 10.8*  HCT 32.9*  PLT 277   ------------------------------------------------------------------------------------------------------------------  Chemistries  Recent Labs  Lab 12/01/17 1706  NA 135  K 4.7  CL 94*  CO2 27  GLUCOSE 102*  BUN 20  CREATININE 4.48*  CALCIUM 8.4*  MG 2.0   ------------------------------------------------------------------------------------------------------------------  Cardiac Enzymes No results for input(s): TROPONINI in the last 168 hours. ------------------------------------------------------------------------------------------------------------------  RADIOLOGY:  No results found.  EKG:   Orders placed or performed during the hospital encounter of 11/25/17  . EKG 12-Lead  . EKG 12-Lead  . EKG 12-Lead  . EKG 12-Lead    IMPRESSION AND PLAN:   * Diverticular abscess with  perforation IV abx. Per surgery IR consulted  * ESRD Nephrology. Discussed with Dr. Candiss Norse. MWF schedule  * Atrial fibrillation Continue amiodarone and Torprol. Hold eliquis  * Chronic systolic chf No signs of fluid overload  * AOCD- stable  * DVT prophylaxis Lovenox  All the records are reviewed and case discussed with Consulting provider. Management plans discussed with the patient, family and they are in agreement.  CODE STATUS: Limited code. DNI but OK to resuscitate.  TOTAL TIME TAKING CARE OF THIS PATIENT: 40 minutes.    Leia Alf Andrez Lieurance M.D on 12/01/2017 at 6:00 PM  Between 7am to 6pm - Pager - [phone]  After 6pm go to [email or web address] - password [email or web address] [facility]  Office  [phone]  CC: Primary care Physician: Baxter Hire, MD  Note: This dictation was prepared with Dragon dictation along with smaller phrase technology. Any transcriptional errors that result from this process are unintentional.

## 2017-12-01 NOTE — H&P (Signed)
Date of Admission:  12/01/2017  Reason for Admission:  Diverticulitis with abscess  History of Present Illness: Maurice Peterson is a 76 y.o. male referred to our office for evaluation of diverticulitis with abscess.  The patient had just had an outpatient CT scan on 11/30/17 for evaluation for possible intra-abdominal tumor.  He has a history of prostate cancer and renal cell carcinoma.  He also reports that he has been having over the past few months, intermittent episodes of brown color urine.  The concern initially was that this could be related to tumor, and hence CT scan was ordered.  Patient was recently hospitalized in October with atrial fibrillation that required cardioversion, as well as a malfunctioning LUE AV fistula that required fistulagram with angioplasty.  He also has a recent diagnosis of IgA vasculitis and required steroid course that he finished about a month ago.  From the abdominal standpoint, he denies any symptoms.  Denies any fevers, chills, chest pain, shortness of breath, nausea, vomiting, abdominal pain, dysuria, hematuria, or blood in the stool.  CT scan showed diverticulitis with a 3.8 x 2.8 abscess abutting the bladder, with concerns for possible colovesicular fistula.   Past Medical History: Past Medical History:  Diagnosis Date  . Arthritis   . Atrial flutter ([place])    a. s/p DCCV 07/2017 briefly successful; b. amio and warfarin; c. CHADS2VASc => 5 (CHF, HTN, age x 2, vascular disease);  [name] CAD (coronary artery disease)    a. remote inferior MI s/p stenting; b. Myoview in 10/2016 with normal perfusion with an EF of 46%  . COPD (chronic obstructive pulmonary disease) ([place])   . ESRD (end stage renal disease) on dialysis ([place])    a. HD MWF  . GERD (gastroesophageal reflux disease)   . Hyperlipidemia   . Hypertension   . Ischemic cardiomyopathy    a. TTE 05/2017: EF 35-40%, unable to exlcude RWMA, mild MR, mildly dilated RV  . Prostate cancer ([place])   . Shortness of  breath dyspnea      Past Surgical History: Past Surgical History:  Procedure Laterality Date  . A/V SHUNT INTERVENTION Left 10/24/2017   Procedure: A/V SHUNT INTERVENTION;  Surgeon: Algernon Huxley, MD;  Location: [facility];  Service: Cardiovascular;  Laterality: Left;  . CARDIOVERSION N/A 07/26/2017   Procedure: Cardioversion;  Surgeon: Corey Skains, MD;  Location: [facility];  Service: Cardiovascular;  Laterality: N/A;  . CARDIOVERSION N/A 08/18/2017   Procedure: CARDIOVERSION;  Surgeon: Corey Skains, MD;  Location: [facility];  Service: Cardiovascular;  Laterality: N/A;  . CARDIOVERSION N/A 10/21/2017   Procedure: CARDIOVERSION;  Surgeon: Wellington Hampshire, MD;  Location: [facility];  Service: Cardiovascular;  Laterality: N/A;  . CARDIOVERSION N/A 11/25/2017   Procedure: CARDIOVERSION;  Surgeon: Wellington Hampshire, MD;  Location: [facility];  Service: Cardiovascular;  Laterality: N/A;  . CATARACT EXTRACTION W/PHACO Right 08/20/2015   Procedure: CATARACT EXTRACTION PHACO AND INTRAOCULAR LENS PLACEMENT ([place]);  Surgeon: Leandrew Koyanagi, MD;  Location: [facility];  Service: Ophthalmology;  Laterality: Right;  . CATARACT EXTRACTION W/PHACO Left 09/10/2015   Procedure: CATARACT EXTRACTION PHACO AND INTRAOCULAR LENS PLACEMENT ([facility]);  Surgeon: Leandrew Koyanagi, MD;  Location: [facility];  Service: Ophthalmology;  Laterality: Left;  . CORONARY STENT PLACEMENT    . 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 PERIPHERAL VASCULAR CATHETERIZATION Left 05/20/2015   Procedure: A/V Shuntogram/Fistulagram;  Surgeon: Katha Cabal, MD;  Location: [facility];  Service: Cardiovascular;  Laterality: Left;  . PERIPHERAL VASCULAR CATHETERIZATION N/A 05/20/2015   Procedure: A/V Shunt Intervention;  Surgeon: Katha Cabal, MD;  Location: [facility];  Service: Cardiovascular;  Laterality: N/A;  . PERIPHERAL VASCULAR CATHETERIZATION Left 08/05/2015    Procedure: A/V Shuntogram/Fistulagram;  Surgeon: Katha Cabal, MD;  Location: [facility];  Service: Cardiovascular;  Laterality: Left;  . PERIPHERAL VASCULAR CATHETERIZATION N/A 08/05/2015   Procedure: A/V Shunt Intervention;  Surgeon: Katha Cabal, MD;  Location: [facility];  Service: Cardiovascular;  Laterality: N/A;  . PERIPHERAL VASCULAR CATHETERIZATION Left 01/06/2016   Procedure: A/V Shuntogram/Fistulagram;  Surgeon: Katha Cabal, MD;  Location: [facility];  Service: Cardiovascular;  Laterality: Left;  . PERIPHERAL VASCULAR CATHETERIZATION N/A 01/06/2016   Procedure: A/V Shunt Intervention;  Surgeon: Katha Cabal, MD;  Location: [facility];  Service: Cardiovascular;  Laterality: N/A;  . PERIPHERAL VASCULAR CATHETERIZATION N/A 12/13/2016   Procedure: Dialysis/Perma Catheter Removal;  Surgeon: Algernon Huxley, MD;  Location: [facility];  Service: Cardiovascular;  Laterality: N/A;    Home Medications: Prior to Admission medications   Medication Sig Start Date End Date Taking? Authorizing Provider  acetaminophen (TYLENOL) 500 MG tablet Take 1,000 mg by mouth daily as needed for moderate pain or headache.    [provider]  albuterol (PROVENTIL HFA;VENTOLIN HFA) 108 (90 BASE) MCG/ACT inhaler Inhale 2 puffs into the lungs every 4 (four) hours as needed for wheezing or shortness of breath.    [provider]  amiodarone (PACERONE) 200 MG tablet Take 400 mg (2 tab) by mouth twice a day for 1 week  then taken 200 mg (1 tab) by mouth  Twice a day Patient taking differently: Take 200 mg by mouth 2 (two) times daily.  10/21/17   Fritzi Mandes, MD  apixaban (ELIQUIS) 5 MG TABS tablet Take 1 tablet (5 mg total) by mouth 2 (two) times daily. 10/26/17   Fritzi Mandes, MD  atorvastatin (LIPITOR) 10 MG tablet Take 10 mg by mouth every evening.     [provider]  azelastine (OPTIVAR) 0.05 % ophthalmic solution Place 1 drop  into both eyes 2 (two) times daily as needed (allergies).    [provider]  calcium acetate (PHOSLO) 667 MG capsule Take 1,334 mg by mouth 3 (three) times daily with meals.    [provider]  Cholecalciferol (VITAMIN D3) 2000 units TABS Take 2,000 Units by mouth daily.     [provider]  dextromethorphan-guaiFENesin (MUCINEX DM) 30-600 MG 12hr tablet Take 1 tablet by mouth at bedtime as needed for cough.    [provider]  DiphenhydrAMINE HCl, Sleep, (ZZZQUIL PO) Take 25 mg by mouth at bedtime as needed (sleep).     [provider]  febuxostat (ULORIC) 40 MG tablet Take 40 mg by mouth every evening.     [provider]  levothyroxine (SYNTHROID, LEVOTHROID) 25 MCG tablet Take 1 tablet (25 mcg total) by mouth daily before breakfast. 11/22/17   Deboraha Sprang, MD  metoprolol succinate (TOPROL-XL) 25 MG 24 hr tablet Take 0.5 tablets (12.5 mg total) by mouth daily. Take with or immediately following a meal. Patient taking differently: Take 12.5 mg by mouth daily after supper. Take with or immediately following a meal. 11/22/17 02/20/18  Deboraha Sprang, MD  montelukast (SINGULAIR) 10 MG tablet Take 10 mg by mouth every evening.  [provider]  multivitamin (RENA-VIT) TABS tablet Take 1 tablet by mouth daily.    [provider]  pantoprazole (PROTONIX) 40 MG tablet Take 40 mg by mouth daily before breakfast.     [provider]  umeclidinium-vilanterol (ANORO ELLIPTA) 62.5-25 MCG/INH AEPB Inhale 1 puff into the lungs daily.    [provider]    Allergies: Allergies  Allergen Reactions  . Nsaids Nausea And Vomiting    Anti-inflammatories.  . Amoxicillin-Pot Clavulanate Nausea And Vomiting    Has patient had a PCN reaction causing immediate rash, facial/tongue/throat swelling, SOB or lightheadedness with hypotension: No Has patient had a PCN reaction causing severe rash involving mucus membranes or  skin necrosis: No Has patient had a PCN reaction that required hospitalization:Yes-vomiting blood Has patient had a PCN reaction occurring within the last 10 years: Yes If all of the above answers are "NO", then may proceed with Cephalosporin use.     Social History:  reports that he quit smoking about 8 years ago. His smoking use included cigarettes. He has a 30.00 pack-year smoking history. he has never used smokeless tobacco. He reports that he does not drink alcohol or use drugs.   Family History: Family History  Problem Relation Age of Onset  . Liver disease Mother   . Heart disease Mother   . Hypertension Mother   . Pancreatic cancer Father     Review of Systems: Review of Systems  Constitutional: Negative for chills and fever.  HENT: Negative for hearing loss.   Eyes: Negative for blurred vision.  Respiratory: Negative for shortness of breath.   Cardiovascular: Negative for chest pain.  Gastrointestinal: Negative for abdominal pain, blood in stool, constipation, diarrhea, nausea and vomiting.  Genitourinary: Negative for dysuria.  Musculoskeletal: Negative for myalgias.  Skin: Negative for rash.  Neurological: Negative for dizziness.  Psychiatric/Behavioral: Negative for depression.  All other systems reviewed and are negative.   Physical Exam BP (!) 92/55   Pulse (!) 106   Temp 98.6 F (37 C) (Oral)   Resp 17   SpO2 95%  CONSTITUTIONAL: No acute distress HEENT:  Normocephalic, atraumatic, extraocular motion intact. NECK: Trachea is midline, and there is no jugular venous distension.  RESPIRATORY:  Lungs are clear bilaterally. CARDIOVASCULAR: Irregular rhythm, low grade tachycardia. No murmurs. GI: The abdomen is soft, obese, nondistended, nontender to palpation. MUSCULOSKELETAL:  Normal muscle strength and tone in all four extremities.  No peripheral edema or cyanosis.  Left upper extremity AV fistula in place. SKIN: Skin turgor is normal. There are no  pathologic skin lesions.  NEUROLOGIC:  Motor and sensation is grossly normal.  Cranial nerves are grossly intact. PSYCH:  Alert and oriented to person, place and time. Affect is normal.   Laboratory Analysis: None recently  Imaging: CT scan abd/pelvis on 12/5 shows extensive diverticular disease over sigmoid colon, with a focus of diverticulitis with a 3.8 x 2.8 cm abscess that abuts the posterior wall of the bladder.    Assessment and Plan: This is a 76 y.o. male who presents with incidental finding of diverticulitis with abscess and concern for possible colovesicular fistula.  I have independently viewed the patient's imaging study.  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  There is a bubble of gas on top portion of bladder.  Discussed with the patient that we should admit him to the hospital to the surgical  service, and start him on IV antibiotics and discuss with radiology the possibility of draining the abscess.  His CT scan from outside facility will be imported into our system for easier access.  His Eliquis will be held.  Will consult nephrology to continue the patient's dialysis treatments and urology for evaluation for a possible colovesicular fistula.  Will also consult the hospitalist team for assistance in managing the patient's comorbidities.  Case has been discussed with Dr. Adonis Huguenin who is the daytime surgeon on call.   Melvyn Neth, [place]

## 2017-12-01 NOTE — Progress Notes (Signed)
ANTIBIOTIC CONSULT NOTE - INITIAL  Pharmacy Consult for Zosyn Indication: intraabdominal infection  Allergies  Allergen Reactions  . Nsaids Nausea And Vomiting    Anti-inflammatories.  . Amoxicillin-Pot Clavulanate Nausea And Vomiting    Has patient had a PCN reaction causing immediate rash, facial/tongue/throat swelling, SOB or lightheadedness with hypotension: No Has patient had a PCN reaction causing severe rash involving mucus membranes or skin necrosis: No Has patient had a PCN reaction that required hospitalization:Yes-vomiting blood Has patient had a PCN reaction occurring within the last 10 years: Yes If all of the above answers are "NO", then may proceed with Cephalosporin use.     Patient Measurements:   Adjusted Body Weight:   Vital Signs: Temp: 98.6 F (37 C) (12/06 1625) Temp Source: Oral (12/06 1625) BP: 92/55 (12/06 1625) Pulse Rate: 106 (12/06 1625) Intake/Output from previous day: No intake/output data recorded. Intake/Output from this shift: No intake/output data recorded.  Labs: Recent Labs    12/01/17 1706  WBC 11.6*  HGB 10.8*  PLT 277  CREATININE 4.48*   Estimated Creatinine Clearance: 17.5 mL/min (A) (by C-G formula based on SCr of 4.48 mg/dL (H)). No results for input(s): VANCOTROUGH, VANCOPEAK, VANCORANDOM, GENTTROUGH, GENTPEAK, GENTRANDOM, TOBRATROUGH, TOBRAPEAK, TOBRARND, AMIKACINPEAK, AMIKACINTROU, AMIKACIN in the last 72 hours.   Microbiology: No results found for this or any previous visit (from the past 720 hour(s)).  Medical History: Past Medical History:  Diagnosis Date  . Arthritis   . Atrial flutter ([place])    a. s/p DCCV 07/2017 briefly successful; b. amio and warfarin; c. CHADS2VASc => 5 (CHF, HTN, age x 2, vascular disease);  [name] CAD (coronary artery disease)    a. remote inferior MI s/p stenting; b. Myoview in 10/2016 with normal perfusion with an EF of 46%  . COPD (chronic obstructive pulmonary disease) ([place])   . ESRD (end  stage renal disease) on dialysis ([place])    a. HD MWF  . GERD (gastroesophageal reflux disease)   . Hyperlipidemia   . Hypertension   . Ischemic cardiomyopathy    a. TTE 05/2017: EF 35-40%, unable to exlcude RWMA, mild MR, mildly dilated RV  . Prostate cancer ([place])   . Shortness of breath dyspnea     Medications:  Medications Prior to Admission  Medication Sig Dispense Refill Last Dose  . acetaminophen (TYLENOL) 500 MG tablet Take 1,000 mg by mouth daily as needed for moderate pain or headache.   Taking  . albuterol (PROVENTIL HFA;VENTOLIN HFA) 108 (90 BASE) MCG/ACT inhaler Inhale 2 puffs into the lungs every 4 (four) hours as needed for wheezing or shortness of breath.   Taking  . amiodarone (PACERONE) 200 MG tablet Take 400 mg (2 tab) by mouth twice a day for 1 week  then taken 200 mg (1 tab) by mouth  Twice a day (Patient taking differently: Take 200 mg by mouth 2 (two) times daily. ) 90 tablet 0 Taking  . apixaban (ELIQUIS) 5 MG TABS tablet Take 1 tablet (5 mg total) by mouth 2 (two) times daily. 60 tablet 2 Taking  . atorvastatin (LIPITOR) 10 MG tablet Take 10 mg by mouth every evening.    Taking  . azelastine (OPTIVAR) 0.05 % ophthalmic solution Place 1 drop into both eyes 2 (two) times daily as needed (allergies).   Taking  . calcium acetate (PHOSLO) 667 MG capsule Take 1,334 mg by mouth 3 (three) times daily with meals.   Taking  . Cholecalciferol (VITAMIN D3) 2000 units TABS Take 2,000  Units by mouth daily.    Taking  . dextromethorphan-guaiFENesin (MUCINEX DM) 30-600 MG 12hr tablet Take 1 tablet by mouth at bedtime as needed for cough.   Taking  . DiphenhydrAMINE HCl, Sleep, (ZZZQUIL PO) Take 25 mg by mouth at bedtime as needed (sleep).    Taking  . febuxostat (ULORIC) 40 MG tablet Take 40 mg by mouth every evening.    Taking  . levothyroxine (SYNTHROID, LEVOTHROID) 25 MCG tablet Take 1 tablet (25 mcg total) by mouth daily before breakfast. 30 tablet 5 Taking  . metoprolol succinate  (TOPROL-XL) 25 MG 24 hr tablet Take 0.5 tablets (12.5 mg total) by mouth daily. Take with or immediately following a meal. (Patient taking differently: Take 12.5 mg by mouth daily after supper. Take with or immediately following a meal.) 90 tablet 3 Taking  . montelukast (SINGULAIR) 10 MG tablet Take 10 mg by mouth every evening.    Taking  . multivitamin (RENA-VIT) TABS tablet Take 1 tablet by mouth daily.   Taking  . pantoprazole (PROTONIX) 40 MG tablet Take 40 mg by mouth daily before breakfast.    Taking  . umeclidinium-vilanterol (ANORO ELLIPTA) 62.5-25 MCG/INH AEPB Inhale 1 puff into the lungs daily.   Taking   Scheduled:  . pantoprazole (PROTONIX) IV  40 mg Intravenous QHS   Assessment: Pharmacy to dose and monitor Zosyn in this 76 year old male with intra-abdoinal infection  Goal of Therapy:    Plan:  Will start Zosyn 3.375 g IV q12 hours.    Emilo,Mehran Guderian D 12/01/2017,6:32 PM

## 2017-12-02 ENCOUNTER — Inpatient Hospital Stay: Payer: Medicare Other

## 2017-12-02 ENCOUNTER — Other Ambulatory Visit: Payer: Self-pay

## 2017-12-02 DIAGNOSIS — K5720 Diverticulitis of large intestine with perforation and abscess without bleeding: Principal | ICD-10-CM

## 2017-12-02 LAB — URINALYSIS, ROUTINE W REFLEX MICROSCOPIC
Bacteria, UA: NONE SEEN
Bilirubin Urine: NEGATIVE
GLUCOSE, UA: NEGATIVE mg/dL
Ketones, ur: NEGATIVE mg/dL
Leukocytes, UA: NEGATIVE
NITRITE: NEGATIVE
PH: 5 (ref 5.0–8.0)
Protein, ur: 100 mg/dL — AB
SPECIFIC GRAVITY, URINE: 1.023 (ref 1.005–1.030)

## 2017-12-02 LAB — BASIC METABOLIC PANEL
Anion gap: 13 (ref 5–15)
BUN: 26 mg/dL — ABNORMAL HIGH (ref 6–20)
CHLORIDE: 95 mmol/L — AB (ref 101–111)
CO2: 27 mmol/L (ref 22–32)
Calcium: 8.3 mg/dL — ABNORMAL LOW (ref 8.9–10.3)
Creatinine, Ser: 5.53 mg/dL — ABNORMAL HIGH (ref 0.61–1.24)
GFR, EST AFRICAN AMERICAN: 10 mL/min — AB (ref >60)
GFR, EST NON AFRICAN AMERICAN: 9 mL/min — AB (ref >60)
Glucose, Bld: 84 mg/dL (ref 65–99)
POTASSIUM: 4.5 mmol/L (ref 3.5–5.1)
SODIUM: 135 mmol/L (ref 135–145)

## 2017-12-02 LAB — CBC
HEMATOCRIT: 31.8 % — AB (ref 40.0–52.0)
Hemoglobin: 10.4 g/dL — ABNORMAL LOW (ref 13.0–18.0)
MCH: 30 pg (ref 26.0–34.0)
MCHC: 32.7 g/dL (ref 32.0–36.0)
MCV: 91.7 fL (ref 80.0–100.0)
PLATELETS: 237 10*3/uL (ref 150–440)
RBC: 3.47 MIL/uL — AB (ref 4.40–5.90)
RDW: 18.3 % — ABNORMAL HIGH (ref 11.5–14.5)
WBC: 9.6 10*3/uL (ref 3.8–10.6)

## 2017-12-02 LAB — MAGNESIUM: Magnesium: 2 mg/dL (ref 1.7–2.4)

## 2017-12-02 LAB — PHOSPHORUS: PHOSPHORUS: 4.9 mg/dL — AB (ref 2.5–4.6)

## 2017-12-02 MED ORDER — LIDOCAINE HCL 1 % IJ SOLN
INTRAMUSCULAR | Status: AC | PRN
  Administered 2017-12-02: 10 mL via INTRADERMAL

## 2017-12-02 MED ORDER — MIDODRINE HCL 5 MG PO TABS: 5.0000 mg | ORAL_TABLET | Freq: Once | ORAL | Status: DC

## 2017-12-02 MED ORDER — FENTANYL CITRATE (PF) 100 MCG/2ML IJ SOLN
INTRAMUSCULAR | Status: AC
  Filled 2017-12-02: qty 2

## 2017-12-02 MED ORDER — FENTANYL CITRATE (PF) 100 MCG/2ML IJ SOLN
INTRAMUSCULAR | Status: AC | PRN
  Administered 2017-12-02 (×2): 25 ug via INTRAVENOUS

## 2017-12-02 MED ORDER — EPOETIN ALFA 10000 UNIT/ML IJ SOLN: 4000.0000 [IU] | INTRAMUSCULAR | Status: DC

## 2017-12-02 MED ORDER — ALBUMIN HUMAN 25 % IV SOLN: 12.5000 g | Freq: Once | INTRAVENOUS | Status: DC

## 2017-12-02 NOTE — Progress Notes (Signed)
HD tx end  

## 2017-12-02 NOTE — Progress Notes (Signed)
[facility] [place] NAME: Maurice Peterson    MR#:  [id number]  DATE OF BIRTH:  13-Oct-1941  SUBJECTIVE:  Denies abdominal pain or  REVIEW OF SYSTEMS:    Review of Systems  Constitutional: Negative for fever, chills weight loss HENT: Negative for ear pain, nosebleeds, congestion, facial swelling, rhinorrhea, neck pain, neck stiffness and ear discharge.   Respiratory: Negative for cough, shortness of breath, wheezing  Cardiovascular: Negative for chest pain, palpitations and leg swelling.  Gastrointestinal: Negative for heartburn, abdominal pain, vomiting, diarrhea or consitpation Genitourinary: Negative for dysuria, urgency, frequency, hematuria Musculoskeletal: Negative for back pain or joint pain Neurological: Negative for dizziness, seizures, syncope, focal weakness,  numbness and headaches.  Hematological: Does not bruise/bleed easily.  Psychiatric/Behavioral: Negative for hallucinations, confusion, dysphoric mood    Tolerating Diet: npo      DRUG ALLERGIES:   Allergies  Allergen Reactions  . Nsaids Nausea And Vomiting    Anti-inflammatories.  . Amoxicillin-Pot Clavulanate Nausea And Vomiting    Has patient had a PCN reaction causing immediate rash, facial/tongue/throat swelling, SOB or lightheadedness with hypotension: No Has patient had a PCN reaction causing severe rash involving mucus membranes or skin necrosis: No Has patient had a PCN reaction that required hospitalization:Yes-vomiting blood Has patient had a PCN reaction occurring within the last 10 years: Yes If all of the above answers are "NO", then may proceed with Cephalosporin use.     VITALS:  Blood pressure (!) 84/41, pulse 87, temperature 97.6 F (36.4 C), temperature source Oral, resp. rate 17, SpO2 96 %.  PHYSICAL EXAMINATION:  Constitutional: Appears well-developed and well-nourished. No distress. HENT: Normocephalic. [name] Oropharynx is clear and moist.  Eyes:  Conjunctivae and EOM are normal. PERRLA, no scleral icterus.  Neck: Normal ROM. Neck supple. No JVD. No tracheal deviation. CVS: RRR, S1/S2 +, no murmurs, no gallops, no carotid bruit.  Pulmonary: Effort and breath sounds normal, no stridor, rhonchi, wheezes, rales.  Abdominal: Soft. BS +,  no distension, tenderness, rebound or guarding.  Musculoskeletal: Normal range of motion. No edema and no tenderness.  Neuro: Alert. CN 2-12 grossly intact. No focal deficits. Skin: Skin is warm and dry. No rash noted. Psychiatric: Normal mood and affect.      LABORATORY PANEL:   CBC Recent Labs  Lab 12/02/17 0417  WBC 9.6  HGB 10.4*  HCT 31.8*  PLT 237   ------------------------------------------------------------------------------------------------------------------  Chemistries  Recent Labs  Lab 12/02/17 0417  NA 135  K 4.5  CL 95*  CO2 27  GLUCOSE 84  BUN 26*  CREATININE 5.53*  CALCIUM 8.3*  MG 2.0   ------------------------------------------------------------------------------------------------------------------  Cardiac Enzymes No results for input(s): TROPONINI in the last 168 hours. ------------------------------------------------------------------------------------------------------------------  RADIOLOGY:  No results found.   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   1. Diverticulitis with abscess: Plan for IR to place drain Continue Zosyn 2. Chronic ischemic cardiomyopathy with EF 35-40%  There are no signs of exacerbation  3. End-stage renal disease: Nephrology consultation for dialysis  4. JGO:TLXBWIO Eliquis for now, if will not require further surgery then plan to restart in am Continue Amiodarone and metoprolol for heart rate control  5. Hypothyroidism: Continue  Synthroid  Management plans discussed with the patient and he is in agreement.  CODE STATUS: partial DNI TOTAL TIME TAKING  CARE OF THIS PATIENT: 30 minutes.   D/w Dr Adonis Huguenin  POSSIBLE D/C 2-4 days, DEPENDING ON CLINICAL CONDITION.   Anicka Stuckert M.D on 12/02/2017 at 8:19 AM  Between 7am to 6pm - Pager - [phone] After 6pm go to [email or web address] - password [email or web address] [facility]  Office  [phone]  CC: Primary care physician; Baxter Hire, MD  Note: This dictation was prepared with Dragon dictation along with smaller phrase technology. Any transcriptional errors that result from this process are unintentional.

## 2017-12-02 NOTE — Progress Notes (Signed)
Pre HD assessment  

## 2017-12-02 NOTE — Procedures (Signed)
Small diverticular abscess  S/p CT aspiration of the anterior pelvic abscess  7cc pus aspirated No comp cx sent Full report in pacs

## 2017-12-02 NOTE — Evaluation (Signed)
Physical Therapy Evaluation Patient Details Name: Maurice Peterson MRN: [id number] DOB: [birth year] Today's Date: 12/02/2017   History of Present Illness  admitted for acute hospitalization due to diverticulitis with abscess; status post CT-guided aspiration of abscess (12/7)  Clinical Impression  Upon evaluation, patient alert and oriented; follows all commands and demonstrates good insight/awareness into deficits.  Bilat UE/LE strength and ROM grossly symmetrical and WFL; no focal weakness, pain reported.  Demonstrates ability to complete bed mobility indep; sit/stand, basic transfers and gait (400') with sup/mod indep.  Optimal comfort/confidence reported with use of RW, but patient prefers use of IV pole at this time (as he likes his 4WRW better than RW).  Dynamic gait test 11/12 due to limited ability to modulate gait speed; completes all other portions without significant LOB or safety concern.  Patient/wife report that current functional performance is baseline for patient; no acute changes in status noted.  No acute PT needs indicated.  Will complete initial order at this time; please re-consult should needs change.     Follow Up Recommendations No PT follow up    Equipment Recommendations       Recommendations for Other Services       Precautions / Restrictions Precautions Precautions: Fall Restrictions Weight Bearing Restrictions: No      Mobility  Bed Mobility Overal bed mobility: Modified Independent                Transfers Overall transfer level: Modified independent Equipment used: None                Ambulation/Gait Ambulation/Gait assistance: Supervision;Modified independent (Device/Increase time) Ambulation Distance (Feet): 400 Feet Assistive device: (pushing IV pole)   Gait velocity: 10' walk time, 7-8 seconds   General Gait Details: reciprocal stepping with fair step height, length, fair gait speed; completes dynamic gait components without  significant gait deviation, LOB or safety concern.  Stairs            Wheelchair Mobility    Modified Rankin (Stroke Patients Only)       Balance Overall balance assessment: Needs assistance Sitting-balance support: No upper extremity supported;Feet supported Sitting balance-Leahy Scale: Good     Standing balance support: No upper extremity supported Standing balance-Leahy Scale: Good                               Pertinent Vitals/Pain Pain Assessment: No/denies pain    Home Living Family/patient expects to be discharged to:: Private residence Living Arrangements: Spouse/significant other Available Help at Discharge: Family Type of Home: House Home Access: Stairs to enter Entrance Stairs-Rails: Left Entrance Stairs-Number of Steps: 2 Home Layout: One level Home Equipment: Walker - 4 wheels      Prior Function Level of Independence: Independent         Comments: Indep with ADLs, household and community mobilization; + driving.  Denies fall history.  Endorses recent use of 4WRW due to intermittent LE weakness over past month.     Hand Dominance        Extremity/Trunk Assessment   Upper Extremity Assessment Upper Extremity Assessment: Overall WFL for tasks assessed    Lower Extremity Assessment Lower Extremity Assessment: Overall WFL for tasks assessed(grossly at least 4+/5 throughout)       Communication   Communication: No difficulties  Cognition Arousal/Alertness: Awake/alert Behavior During Therapy: WFL for tasks assessed/performed Overall Cognitive Status: Within Functional Limits for tasks assessed  General Comments      Exercises Other Exercises Other Exercises: Short-distance gait without assist device, close sup-lateral trunk lean during L LE stance; voices optimal comfort with UE support during mobility efforts. Other Exercises: Additional short-distance gait with  RW, mod indep.  Do recommend continued use of RW for optimal safety (patient prefers 4WRW); patient/wife voice understanding.   Assessment/Plan    PT Assessment Patent does not need any further PT services  PT Problem List         PT Treatment Interventions      PT Goals (Current goals can be found in the Care Plan section)  Acute Rehab PT Goals Patient Stated Goal: to go home PT Goal Formulation: All assessment and education complete, DC therapy Time For Goal Achievement: 12/02/17 Potential to Achieve Goals: Good    Frequency     Barriers to discharge        Co-evaluation               AM-PAC PT "6 Clicks" Daily Activity  Outcome Measure Difficulty turning over in bed (including adjusting bedclothes, sheets and blankets)?: None Difficulty moving from lying on back to sitting on the side of the bed? : None Difficulty sitting down on and standing up from a chair with arms (e.g., wheelchair, bedside commode, etc,.)?: None Help needed moving to and from a bed to chair (including a wheelchair)?: None Help needed walking in hospital room?: None Help needed climbing 3-5 steps with a railing? : A Little 6 Click Score: 23    End of Session Equipment Utilized During Treatment: Gait belt Activity Tolerance: Patient tolerated treatment well Patient left: in bed;with bed alarm set;with family/visitor present;with call bell/phone within reach Nurse Communication: Mobility status PT Visit Diagnosis: Difficulty in walking, not elsewhere classified (R26.2)    Time: 4174-0814 PT Time Calculation (min) (ACUTE ONLY): 13 min   Charges:   PT Evaluation $PT Eval Low Complexity: 1 Low PT Treatments $Gait Training: 8-22 mins   PT G Codes:   PT G-Codes **NOT FOR INPATIENT CLASS** Functional Assessment Tool Used: AM-PAC 6 Clicks Basic Mobility Functional Limitation: Mobility: Walking and moving around Mobility: Walking and Moving Around Current Status (G8185): At least 1 percent  but less than 20 percent impaired, limited or restricted Mobility: Walking and Moving Around Goal Status [phone]): At least 1 percent but less than 20 percent impaired, limited or restricted Mobility: Walking and Moving Around Discharge Status [phone]): At least 1 percent but less than 20 percent impaired, limited or restricted    Inocencia Murtaugh H. Owens Shark, PT, DPT, NCS 12/02/17, 12:23 PM [phone]

## 2017-12-02 NOTE — Progress Notes (Signed)
Post HD assessment  

## 2017-12-02 NOTE — Progress Notes (Signed)
CC: Diverticulitis with abscess Subjective: 76 year old male with diverticulitis with abscess.  Underwent a CT-guided aspiration this morning.  Otherwise states he is feeling better and well.  Objective: Vital signs in last 24 hours: Temp:  [97.6 F (36.4 C)-98.6 F (37 C)] 98.5 F (36.9 C) (12/07 1108) Pulse Rate:  [87-106] 94 (12/07 1108) Resp:  [11-17] 17 (12/07 1108) BP: (77-94)/(41-68) 86/59 (12/07 1108) SpO2:  [95 %-96 %] 96 % (12/07 1108) Weight:  [111.1 kg (245 lb)] 111.1 kg (245 lb) (12/06 1526) Last BM Date: 12/01/17  Intake/Output from previous day: 12/06 0701 - 12/07 0700 In: 880 [P.O.:840; IV Piggyback:40] Out: -  Intake/Output this shift: Total I/O In: 350 [I.V.:350] Out: -   Physical exam:  : No acute distress Chest: Clear to auscultation Heart: Rate and rhythm Abdomen: Soft, nontender, nondistended  Lab Results: CBC  Recent Labs    12/01/17 1706 12/02/17 0417  WBC 11.6* 9.6  HGB 10.8* 10.4*  HCT 32.9* 31.8*  PLT 277 237   BMET Recent Labs    12/01/17 1706 12/02/17 0417  NA 135 135  K 4.7 4.5  CL 94* 95*  CO2 27 27  GLUCOSE 102* 84  BUN 20 26*  CREATININE 4.48* 5.53*  CALCIUM 8.4* 8.3*   PT/INR No results for input(s): LABPROT, INR in the last 72 hours. ABG No results for input(s): PHART, HCO3 in the last 72 hours.  Invalid input(s): PCO2, PO2  Studies/Results: Ct Image Guided Drainage By Percutaneous Catheter  Result Date: 12/02/2017 INDICATION: Small anterior pelvic diverticular abscess EXAM: CT GUIDED DRAINAGE OF ANTERIOR PELVIC DIVERTICULAR ABSCESS MEDICATIONS: The patient is currently admitted to the hospital and receiving intravenous antibiotics. The antibiotics were administered within an appropriate time frame prior to the initiation of the procedure. ANESTHESIA/SEDATION: 50 mcg IV Fentanyl Moderate Sedation Time:  15 MINUTES The patient was continuously monitored during the procedure by the interventional radiology nurse  under my direct supervision. COMPLICATIONS: None immediate. TECHNIQUE: Informed written consent was obtained from the patient after a thorough discussion of the procedural risks, benefits and alternatives. All questions were addressed. Maximal Sterile Barrier Technique was utilized including caps, mask, sterile gowns, sterile gloves, sterile drape, hand hygiene and skin antiseptic. A timeout was performed prior to the initiation of the procedure. PROCEDURE: Previous imaging reviewed. Patient positioned supine. Noncontrast localization CT performed. The small abscess between the sigmoid colon and bladder dome was localized. Overlying skin marked for a left abdominal approach. Under sterile conditions and local anesthesia, a 17 gauge 11.8 cm access needle was advanced from a left anterior abdominal oblique approach into the small abscess. Needle position confirmed with CT. Syringe aspiration yielded 7 cc bloody fluid. This completely collapsed the abscess. Sample sent for culture. Needle removed. No immediate complication. Patient tolerated the procedure well. FINDINGS: CT imaging confirms needle placed in the small anterior pelvic diverticular abscess for aspiration IMPRESSION: Successful CT-guided anterior pelvic diverticular abscess drainage by needle aspiration only. 7 cc purulent fluid aspirated which collapsed the abscess. Sample sent for culture. Electronically Signed   By: Jerilynn Mages.  Shick M.D.   On: 12/02/2017 11:13    Anti-infectives: Anti-infectives (From admission, onward)   Start     Dose/Rate Route Frequency Ordered Stop   12/01/17 1845  piperacillin-tazobactam (ZOSYN) IVPB 3.375 g     3.375 g 12.5 mL/hr over 240 Minutes Intravenous Every 12 hours 12/01/17 1832        Assessment/Plan:  76 year old male with diverticulitis with abscess.  Abscess was aspirated morning.  Plan to follow clinically and continue IV antibiotics.  Encourage ambulation and incentive spirometer usage.  Arnetra Terris T. Adonis Huguenin,  MD, [facility] General Surgeon [facility]  Day [facility] [phone] Night [facility] [phone] 12/02/2017

## 2017-12-02 NOTE — Progress Notes (Signed)
HD tx start 

## 2017-12-02 NOTE — Progress Notes (Signed)
[name], [name] 12/02/17  Subjective:   Patient known to Maurice Peterson from previous admissions.  He presents with diverticulitis with abscess.  He underwent a CT-guided aspiration this morning.  He feels well.  No shortness of breath.  No leg edema.  He normally dialyzes Monday, Wednesday and Friday.  His last dialysis was yesterday  Objective:  Vital signs in last 24 hours:  Temp:  [97.6 F (36.4 C)-98.6 F (37 C)] 97.7 F (36.5 C) (12/07 1224) Pulse Rate:  [87-106] 92 (12/07 1224) Resp:  [11-18] 18 (12/07 1224) BP: (77-94)/(41-68) 88/53 (12/07 1224) SpO2:  [95 %-96 %] 95 % (12/07 1224) Weight:  [107.8 kg (237 lb 9.6 oz)-111.1 kg (245 lb)] 107.8 kg (237 lb 9.6 oz) (12/07 1130)  Weight change:  Filed Weights   12/02/17 1130  Weight: 107.8 kg (237 lb 9.6 oz)    Intake/Output:    Intake/Output Summary (Last 24 hours) at 12/02/2017 1418 Last data filed at 12/02/2017 1100 Gross per 24 hour  Intake 1470 ml  Output -  Net 1470 ml     Physical Exam: General:  No acute distress, laying in the bed  HEENT  anicteric, moist oral mucous membranes  Neck  supple  Pulm/lungs  normal breathing effort, lungs are clear  CVS/Heart  regular, no rub or gallop  Abdomen:   Soft, nontender  Extremities:  No peripheral edema  Neurologic:  Alert, oriented  Skin:  Mild residual petechial rash          Basic Metabolic Panel:  Recent Labs  Lab 12/01/17 1706 12/02/17 0417  NA 135 135  K 4.7 4.5  CL 94* 95*  CO2 27 27  GLUCOSE 102* 84  BUN 20 26*  CREATININE 4.48* 5.53*  CALCIUM 8.4* 8.3*  MG 2.0 2.0  PHOS 3.6 4.9*     CBC: Recent Labs  Lab 12/01/17 1706 12/02/17 0417  WBC 11.6* 9.6  NEUTROABS 8.8*  --   HGB 10.8* 10.4*  HCT 32.9* 31.8*  MCV 91.4 91.7  PLT 277 237     No results found for: HEPBSAG, HEPBSAB, HEPBIGM    Microbiology:  Recent Results (from the past 240 hour(s))  MRSA PCR Screening     Status: None   Collection Time:  12/01/17  6:57 PM  Result Value Ref Range Status   MRSA by PCR NEGATIVE NEGATIVE Final    Comment:        The GeneXpert MRSA Assay (FDA approved for NASAL specimens only), is one component of a comprehensive MRSA colonization surveillance program. It is not intended to diagnose MRSA infection nor to guide or monitor treatment for MRSA infections.     Coagulation Studies: No results for input(s): LABPROT, INR in the last 72 hours.  Urinalysis: Recent Labs    12/02/17 0530  COLORURINE YELLOW*  LABSPEC 1.023  PHURINE 5.0  GLUCOSEU NEGATIVE  HGBUR MODERATE*  BILIRUBINUR NEGATIVE  KETONESUR NEGATIVE  PROTEINUR 100*  NITRITE NEGATIVE  LEUKOCYTESUR NEGATIVE      Imaging: Ct Image Guided Drainage By Percutaneous Catheter  Result Date: 12/02/2017 INDICATION: Small anterior pelvic diverticular abscess EXAM: CT GUIDED DRAINAGE OF ANTERIOR PELVIC DIVERTICULAR ABSCESS MEDICATIONS: The patient is currently admitted to the hospital and receiving intravenous antibiotics. The antibiotics were administered within an appropriate time frame prior to the initiation of the procedure. ANESTHESIA/SEDATION: 50 mcg IV Fentanyl Moderate Sedation Time:  15 MINUTES The patient was continuously monitored during the procedure by the interventional radiology nurse under my  direct supervision. COMPLICATIONS: None immediate. TECHNIQUE: Informed written consent was obtained from the patient after a thorough discussion of the procedural risks, benefits and alternatives. All questions were addressed. Maximal Sterile Barrier Technique was utilized including caps, mask, sterile gowns, sterile gloves, sterile drape, hand hygiene and skin antiseptic. A timeout was performed prior to the initiation of the procedure. PROCEDURE: Previous imaging reviewed. Patient positioned supine. Noncontrast localization CT performed. The small abscess between the sigmoid colon and bladder dome was localized. Overlying skin marked for  a left abdominal approach. Under sterile conditions and local anesthesia, a 17 gauge 11.8 cm access needle was advanced from a left anterior abdominal oblique approach into the small abscess. Needle position confirmed with CT. Syringe aspiration yielded 7 cc bloody fluid. This completely collapsed the abscess. Sample sent for culture. Needle removed. No immediate complication. Patient tolerated the procedure well. FINDINGS: CT imaging confirms needle placed in the small anterior pelvic diverticular abscess for aspiration IMPRESSION: Successful CT-guided anterior pelvic diverticular abscess drainage by needle aspiration only. 7 cc purulent fluid aspirated which collapsed the abscess. Sample sent for culture. Electronically Signed   By: Jerilynn Mages.  Shick M.D.   On: 12/02/2017 11:13     Medications:   . sodium chloride 50 mL/hr at 12/02/17 0010  . piperacillin-tazobactam (ZOSYN)  IV 3.375 g (12/02/17 0625)   . amiodarone  200 mg Oral BID  . calcium acetate  1,334 mg Oral TID WC  . fentaNYL      . levothyroxine  25 mcg Oral QAC breakfast  . metoprolol succinate  12.5 mg Oral QPC supper  . montelukast  10 mg Oral QPM  . pantoprazole  40 mg Oral QAC breakfast  . pantoprazole (PROTONIX) IV  40 mg Intravenous QHS  . umeclidinium-vilanterol  1 puff Inhalation Daily   acetaminophen, albuterol, guaiFENesin **AND** dextromethorphan, diphenhydrAMINE, HYDROmorphone (DILAUDID) injection, ondansetron **OR** ondansetron (ZOFRAN) IV, oxyCODONE, polyethylene glycol  Assessment/ Plan:  76 y.o. 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  [facility]/MWF/[address]  1.  End-stage renal disease 2.  Anemia of chronic kidney  disease 3.  Secondary hyperparathyroidism 4.  IgA vasculitis with generalized petechial rash-improved  Plan:  routine dialysis today Hemoglobin is 10.4, will continue Procrit with dialysis We will continue to monitor phosphorus during hospitalization.  Current level 4.9 12/7 - Patient had aspiration of diverticulitis related abscess 7 cc of purulent Aspirate was obtained        LOS: [place] 12/7/20182:18 [place], [place]

## 2017-12-02 NOTE — Consult Note (Signed)
Chief Complaint: Patient was seen in consultation today for CT-guided abscess aspiration/drainage Referring Physician(s): [place]  Supervising Physician: Daryll Brod  Patient Status: [place] - In-pt  History of Present Illness: Maurice Peterson is a 76 y.o. male with past medical history of coronary artery disease/ischemic cardiomyopathy, end-stage renal disease, paroxysmal atrial fibrillation, hypothyroidism, COPD, GERD, hypertension/hyperlipidemia, prostate cancer with radiation treatment approximately 10 years ago in addition to cryoablation renal cell carcinoma 2008.  Secondary to reported history of intermittent brown colored urine patient underwent outside CT scan which revealed small perivesical abscess.  He was referred to [facility] for further evaluation and possible image aspiration/drainage of the abscess.  Past Medical History:  Diagnosis Date  . Arthritis   . Atrial flutter ([place])    a. s/p DCCV 07/2017 briefly successful; b. amio and warfarin; c. CHADS2VASc => 5 (CHF, HTN, age x 2, vascular disease);  [name] CAD (coronary artery disease)    a. remote inferior MI s/p stenting; b. Myoview in 10/2016 with normal perfusion with an EF of 46%  . COPD (chronic obstructive pulmonary disease) ([place])   . ESRD (end stage renal disease) on dialysis ([place])    a. HD MWF  . GERD (gastroesophageal reflux disease)   . Hyperlipidemia   . Hypertension   . Ischemic cardiomyopathy    a. TTE 05/2017: EF 35-40%, unable to exlcude RWMA, mild MR, mildly dilated RV  . Prostate cancer ([place])   . Shortness of breath dyspnea     Past Surgical History:  Procedure Laterality Date  . A/V SHUNT INTERVENTION Left 10/24/2017   Procedure: A/V SHUNT INTERVENTION;  Surgeon: Algernon Huxley, MD;  Location: [place] [facility];  Service: Cardiovascular;  Laterality: Left;  . CARDIOVERSION N/A 07/26/2017   Procedure: Cardioversion;  Surgeon: Corey Skains, MD;  Location: [facility];  Service: Cardiovascular;   Laterality: N/A;  . CARDIOVERSION N/A 08/18/2017   Procedure: CARDIOVERSION;  Surgeon: Corey Skains, MD;  Location: [facility];  Service: Cardiovascular;  Laterality: N/A;  . CARDIOVERSION N/A 10/21/2017   Procedure: CARDIOVERSION;  Surgeon: Wellington Hampshire, MD;  Location: [facility];  Service: Cardiovascular;  Laterality: N/A;  . CARDIOVERSION N/A 11/25/2017   Procedure: CARDIOVERSION;  Surgeon: Wellington Hampshire, MD;  Location: [facility];  Service: Cardiovascular;  Laterality: N/A;  . CATARACT EXTRACTION W/PHACO Right 08/20/2015   Procedure: CATARACT EXTRACTION PHACO AND INTRAOCULAR LENS PLACEMENT ([place]);  Surgeon: Leandrew Koyanagi, MD;  Location: [place];  Service: Ophthalmology;  Laterality: Right;  . CATARACT EXTRACTION W/PHACO Left 09/10/2015   Procedure: CATARACT EXTRACTION PHACO AND INTRAOCULAR LENS PLACEMENT (I[facility]);  Surgeon: Leandrew Koyanagi, MD;  Location: [place];  Service: Ophthalmology;  Laterality: Left;  . CORONARY STENT PLACEMENT    . 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 PERIPHERAL VASCULAR CATHETERIZATION Left 05/20/2015   Procedure: A/V Shuntogram/Fistulagram;  Surgeon: Katha Cabal, MD;  Location: [place] [facility];  Service: Cardiovascular;  Laterality: Left;  . PERIPHERAL VASCULAR CATHETERIZATION N/A 05/20/2015   Procedure: A/V Shunt Intervention;  Surgeon: Katha Cabal, MD;  Location: [place] [facility];  Service: Cardiovascular;  Laterality: N/A;  . PERIPHERAL VASCULAR CATHETERIZATION Left 08/05/2015   Procedure: A/V Shuntogram/Fistulagram;  Surgeon: Katha Cabal, MD;  Location: [place] [facility];  Service: Cardiovascular;  Laterality: Left;  . PERIPHERAL VASCULAR CATHETERIZATION N/A 08/05/2015   Procedure: A/V Shunt Intervention;  Surgeon: Katha Cabal, MD;  Location: [place] [facility];  Service: Cardiovascular;  Laterality: N/A;  . PERIPHERAL VASCULAR CATHETERIZATION Left 01/06/2016   Procedure: A/V  Shuntogram/Fistulagram;  Surgeon: Katha Cabal, MD;  Location: [place] [facility];  Service: Cardiovascular;  Laterality: Left;  . PERIPHERAL VASCULAR CATHETERIZATION N/A 01/06/2016   Procedure: A/V Shunt Intervention;  Surgeon: Katha Cabal, MD;  Location: [place] [facility];  Service: Cardiovascular;  Laterality: N/A;  . PERIPHERAL VASCULAR CATHETERIZATION N/A 12/13/2016   Procedure: Dialysis/Perma Catheter Removal;  Surgeon: Algernon Huxley, MD;  Location: [place] [facility];  Service: Cardiovascular;  Laterality: N/A;    Allergies: Nsaids and Amoxicillin-pot clavulanate  Medications: Prior to Admission medications   Medication Sig Start Date End Date Taking? Authorizing Provider  acetaminophen (TYLENOL) 500 MG tablet Take 1,000 mg by mouth daily as needed for moderate pain or headache.    [provider]  albuterol (PROVENTIL HFA;VENTOLIN HFA) 108 (90 BASE) MCG/ACT inhaler Inhale 2 puffs into the lungs every 4 (four) hours as needed for wheezing or shortness of breath.    [provider]  amiodarone (PACERONE) 200 MG tablet Take 400 mg (2 tab) by mouth twice a day for 1 week  then taken 200 mg (1 tab) by mouth  Twice a day Patient taking differently: Take 200 mg by mouth 2 (two) times daily.  10/21/17   Fritzi Mandes, MD  apixaban (ELIQUIS) 5 MG TABS tablet Take 1 tablet (5 mg total) by mouth 2 (two) times daily. 10/26/17   Fritzi Mandes, MD  atorvastatin (LIPITOR) 10 MG tablet Take 10 mg by mouth every evening.     [provider]  azelastine (OPTIVAR) 0.05 % ophthalmic solution Place 1 drop into both eyes 2 (two) times daily as needed (allergies).    [provider]  calcium acetate (PHOSLO) 667 MG capsule Take 1,334 mg by mouth 3 (three) times daily with meals.    [provider]  Cholecalciferol (VITAMIN D3) 2000 units TABS Take 2,000 Units by mouth daily.     [provider]  dextromethorphan-guaiFENesin (MUCINEX DM) 30-600  MG 12hr tablet Take 1 tablet by mouth at bedtime as needed for cough.    [provider]  DiphenhydrAMINE HCl, Sleep, (ZZZQUIL PO) Take 25 mg by mouth at bedtime as needed (sleep).     [provider]  febuxostat (ULORIC) 40 MG tablet Take 40 mg by mouth every evening.     [provider]  levothyroxine (SYNTHROID, LEVOTHROID) 25 MCG tablet Take 1 tablet (25 mcg total) by mouth daily before breakfast. 11/22/17   Deboraha Sprang, MD  metoprolol succinate (TOPROL-XL) 25 MG 24 hr tablet Take 0.5 tablets (12.5 mg total) by mouth daily. Take with or immediately following a meal. Patient taking differently: Take 12.5 mg by mouth daily after supper. Take with or immediately following a meal. 11/22/17 02/20/18  Deboraha Sprang, MD  montelukast (SINGULAIR) 10 MG tablet Take 10 mg by mouth every evening.     [provider]  multivitamin (RENA-VIT) TABS tablet Take 1 tablet by mouth daily.    [provider]  pantoprazole (PROTONIX) 40 MG tablet Take 40 mg by mouth daily before breakfast.     [provider]  umeclidinium-vilanterol (ANORO ELLIPTA) 62.5-25 MCG/INH AEPB Inhale 1 puff into the lungs daily.    [provider]     Family History  Problem Relation Age of Onset  . Liver disease Mother   . Heart disease Mother   . Hypertension Mother   .  Pancreatic cancer Father     Social History   Socioeconomic History  . Marital status: Married    Spouse name: None  . Number of children: None  . Years of education: None  . Highest education level: None  Social Needs  . Financial resource strain: None  . Food insecurity - worry: None  . Food insecurity - inability: None  . Transportation needs - medical: None  . Transportation needs - non-medical: None  Occupational History  . None  Tobacco Use  . Smoking status: Former Smoker    Packs/day: 1.00    Years: 30.00    Pack years: 30.00    Types: Cigarettes    Last attempt to quit:  05/19/2009    Years since quitting: 8.5  . Smokeless tobacco: Never Used  Substance and Sexual Activity  . Alcohol use: No  . Drug use: No  . Sexual activity: Yes  Other Topics Concern  . None  Social History Narrative  . None      Review of Systems currently denies fever, headache, chest pain, dyspnea, cough, abdominal/back pain, nausea, vomiting or bleeding.  Vital Signs: BP 92/68 (BP Location: Right Arm)   Pulse 89   Temp 97.6 F (36.4 C) (Oral)   Resp 17   SpO2 95%   Physical Exam awake, alert.  Chest clear to auscultation bilaterally.  Heart with regular rate and rhythm.  Abdomen soft, positive bowel sounds, nontender.  Left upper extremity fistula with positive thrill/bruit.  Imaging: No results found.  Labs:  CBC: Recent Labs    10/26/17 1018 11/22/17 1045 12/01/17 1706 12/02/17 0417  WBC 16.7* 12.4* 11.6* 9.6  HGB 11.0* 11.2* 10.8* 10.4*  HCT 34.8* 34.8* 32.9* 31.8*  PLT 128* 259 277 237    COAGS: Recent Labs    06/17/17 1430  10/19/17 1713  10/24/17 0422 10/24/17 1732 10/25/17 0118 10/26/17 0823 11/22/17 1045  INR 1.06   < > 2.57   < > 2.02  --  1.48 1.44 1.3*  APTT 34  --  37*  --   --  32  --   --   --    < > = values in this interval not displayed.    BMP: Recent Labs    10/26/17 1018 11/22/17 1045 12/01/17 1706 12/02/17 0417  NA 131* 144 135 135  K 5.3* 5.0 4.7 4.5  CL 93* 98 94* 95*  CO2 23 25 27 27   GLUCOSE 92 85 102* 84  BUN 86* 34* 20 26*  CALCIUM 8.3* 8.9 8.4* 8.3*  CREATININE 7.48* 6.92* 4.48* 5.53*  GFRNONAA 6* 7* 12* 9*  GFRAA 7* 8* 14* 10*    LIVER FUNCTION TESTS: Recent Labs    06/20/17 1202 09/12/17 1321 10/18/17 1030 10/19/17 1446 10/24/17 0928 10/26/17 1018  BILITOT 1.3* 1.0 0.5 1.1  --   --   AST 19 22 9 16   --   --   ALT 21 23 22 24   --   --   ALKPHOS 40 46 65 45  --   --   PROT 7.5 8.4* 6.5 6.9  --   --   ALBUMIN 3.8 4.3 4.2 3.7 3.1* 3.4*    TUMOR MARKERS: No results for input(s): AFPTM, CEA,  CA199, CHROMGRNA in the last 8760 hours.  Assessment and Plan: 76 y.o. male with past medical history of coronary artery disease/ischemic cardiomyopathy, end-stage renal disease, paroxysmal atrial fibrillation, hypothyroidism, COPD, GERD, hypertension/hyperlipidemia, prostate cancer with radiation treatment approximately 10  years ago in addition to cryoablation renal cell carcinoma 2008.  Secondary to reported history of intermittent brown colored urine patient underwent outside CT scan which revealed small perivesical abscess.  He was referred to [facility] for further evaluation and possible image aspiration/drainage of the abscess.  Prior imaging studies have been reviewed by Dr. Annamaria Boots.  Plan at this time is for CT-guided aspiration and possible drain placement today  if abscess is large enough to accommodate drain.Risks and benefits discussed with the patient/spouse including bleeding, infection, damage to adjacent structures, bowel perforation/fistula connection, and sepsis. All of the patient's questions were answered, patient is agreeable to proceed. Consent signed and in chart.     Thank you for this interesting consult.  I greatly enjoyed meeting TARIUS STANGELO and look forward to participating in their care.  A copy of this report was sent to the requesting provider on this date.  Electronically Signed: D. Rowe Robert, PA-C 12/02/2017, 9:43 AM   I spent a total of 30 minutes    in face to face in clinical consultation, greater than 50% of which was counseling/coordinating care for CT-guided aspiration/possible drainage of pelvic abscess

## 2017-12-03 LAB — CBC
HEMATOCRIT: 31.1 % — AB (ref 40.0–52.0)
HEMOGLOBIN: 10.4 g/dL — AB (ref 13.0–18.0)
MCH: 30.6 pg (ref 26.0–34.0)
MCHC: 33.4 g/dL (ref 32.0–36.0)
MCV: 91.7 fL (ref 80.0–100.0)
Platelets: 242 10*3/uL (ref 150–440)
RBC: 3.39 MIL/uL — AB (ref 4.40–5.90)
RDW: 18.1 % — ABNORMAL HIGH (ref 11.5–14.5)
WBC: 7.5 10*3/uL (ref 3.8–10.6)

## 2017-12-03 LAB — BASIC METABOLIC PANEL
ANION GAP: 10 (ref 5–15)
BUN: 14 mg/dL (ref 6–20)
CALCIUM: 8.3 mg/dL — AB (ref 8.9–10.3)
CO2: 29 mmol/L (ref 22–32)
Chloride: 100 mmol/L — ABNORMAL LOW (ref 101–111)
Creatinine, Ser: 3.81 mg/dL — ABNORMAL HIGH (ref 0.61–1.24)
GFR, EST AFRICAN AMERICAN: 16 mL/min — AB (ref >60)
GFR, EST NON AFRICAN AMERICAN: 14 mL/min — AB (ref >60)
Glucose, Bld: 88 mg/dL (ref 65–99)
POTASSIUM: 4.2 mmol/L (ref 3.5–5.1)
Sodium: 139 mmol/L (ref 135–145)

## 2017-12-03 MED ORDER — VITAMIN D 1000 UNITS PO TABS: 2000.0000 [IU] | ORAL_TABLET | Freq: Every day | ORAL | Status: DC

## 2017-12-03 MED ORDER — ATORVASTATIN CALCIUM 10 MG PO TABS: 10.0000 mg | ORAL_TABLET | Freq: Every evening | ORAL | Status: DC

## 2017-12-03 MED ORDER — RENA-VITE PO TABS: 1.0000 | ORAL_TABLET | Freq: Every day | ORAL | Status: DC

## 2017-12-03 MED ORDER — APIXABAN 5 MG PO TABS: 5.0000 mg | ORAL_TABLET | Freq: Two times a day (BID) | ORAL | Status: DC

## 2017-12-03 MED ORDER — FEBUXOSTAT 40 MG PO TABS
40.0000 mg | ORAL_TABLET | Freq: Every evening | ORAL | Status: DC
Start: 2017-12-03 — End: 2017-12-03
  Filled 2017-12-03: qty 1

## 2017-12-03 MED ORDER — PIPERACILLIN-TAZOBACTAM 3.375 G IVPB: 3.3750 g | Freq: Three times a day (TID) | INTRAVENOUS | Status: DC

## 2017-12-03 MED ORDER — NYSTATIN 100000 UNIT/GM EX POWD
CUTANEOUS | 0 refills | Status: DC
Start: 2017-12-03 — End: 2017-12-29

## 2017-12-03 MED ORDER — METRONIDAZOLE 500 MG PO TABS: 500.0000 mg | ORAL_TABLET | Freq: Three times a day (TID) | ORAL | 0 refills | Status: AC

## 2017-12-03 MED ORDER — CIPROFLOXACIN HCL 500 MG PO TABS: 500.0000 mg | ORAL_TABLET | Freq: Two times a day (BID) | ORAL | 0 refills | Status: AC

## 2017-12-03 NOTE — Progress Notes (Signed)
[facility], [name] 12/03/17  Subjective:   Patient known to [place] from previous admissions.  He presents with diverticulitis with abscess.  He underwent a CT-guided aspiration this morning.  He feels well.  No shortness of breath.  No leg edema.  He normally dialyzes Monday, Wednesday and Friday.   Patient was dialyzed yesterday. Tolerated the treatment well States he had loose stools today after drinking coffee  Objective:  Vital signs in last 24 hours:  Temp:  [97.3 F (36.3 C)-98.6 F (37 C)] 98.3 F (36.8 C) (12/08 1221) Pulse Rate:  [89-94] 91 (12/08 1221) Resp:  [16-25] 20 (12/08 1221) BP: (72-101)/(44-77) 85/55 (12/08 1221) SpO2:  [91 %-96 %] 95 % (12/08 1221) Weight:  [111.1 kg (244 lb 14.9 oz)-111.8 kg (246 lb 7.6 oz)] 111.1 kg (244 lb 14.9 oz) (12/07 2026)  Weight change:  Filed Weights   12/02/17 1130 12/02/17 1700 12/02/17 2026  Weight: 107.8 kg (237 lb 9.6 oz) 111.8 kg (246 lb 7.6 oz) 111.1 kg (244 lb 14.9 oz)    Intake/Output:    Intake/Output Summary (Last 24 hours) at 12/03/2017 1224 Last data filed at 12/03/2017 1012 Gross per 24 hour  Intake 683 ml  Output -33 ml  Net 716 ml     Physical Exam: General:  No acute distress, laying in the bed  HEENT  anicteric, moist oral mucous membranes  Neck  supple  Pulm/lungs  normal breathing effort, lungs are clear  CVS/Heart  regular, no rub or gallop  Abdomen:   Soft, nontender  Extremities:  No peripheral edema  Neurologic:  Alert, oriented  Skin:  Mild residual petechial rash          Basic Metabolic Panel:  Recent Labs  Lab 12/01/17 1706 12/02/17 0417 12/03/17 0412  NA 135 135 139  K 4.7 4.5 4.2  CL 94* 95* 100*  CO2 27 27 29   GLUCOSE 102* 84 88  BUN 20 26* 14  CREATININE 4.48* 5.53* 3.81*  CALCIUM 8.4* 8.3* 8.3*  MG 2.0 2.0  --   PHOS 3.6 4.9*  --      CBC: Recent Labs  Lab 12/01/17 1706 12/02/17 0417 12/03/17 0412  WBC 11.6* 9.6 7.5  NEUTROABS 8.8*   --   --   HGB 10.8* 10.4* 10.4*  HCT 32.9* 31.8* 31.1*  MCV 91.4 91.7 91.7  PLT 277 237 242     No results found for: HEPBSAG, HEPBSAB, HEPBIGM    Microbiology:  Recent Results (from the past 240 hour(s))  MRSA PCR Screening     Status: None   Collection Time: 12/01/17  6:57 PM  Result Value Ref Range Status   MRSA by PCR NEGATIVE NEGATIVE Final    Comment:        The GeneXpert MRSA Assay (FDA approved for NASAL specimens only), is one component of a comprehensive MRSA colonization surveillance program. It is not intended to diagnose MRSA infection nor to guide or monitor treatment for MRSA infections.   Aerobic/Anaerobic Culture (surgical/deep wound)     Status: None (Preliminary result)   Collection Time: 12/02/17 10:55 AM  Result Value Ref Range Status   Specimen Description ABSCESS PELVIS  Final   Special Requests Normal  Final   Gram Stain   Final    ABUNDANT WBC PRESENT, PREDOMINANTLY PMN ABUNDANT GRAM POSITIVE COCCI ABUNDANT GRAM NEGATIVE RODS    Culture   Final    CULTURE REINCUBATED FOR BETTER GROWTH Performed at [facility], [address], [place], [place] [address]    Report Status PENDING  Incomplete    Coagulation Studies: No results for input(s): LABPROT, INR in the last 72 hours.  Urinalysis: Recent Labs    12/02/17 0530  COLORURINE YELLOW*  LABSPEC 1.023  PHURINE 5.0  GLUCOSEU NEGATIVE  HGBUR MODERATE*  BILIRUBINUR NEGATIVE  KETONESUR NEGATIVE  PROTEINUR 100*  NITRITE NEGATIVE  LEUKOCYTESUR NEGATIVE      Imaging: Ct Image Guided Drainage By Percutaneous Catheter  Result Date: 12/02/2017 INDICATION: Small anterior pelvic diverticular abscess EXAM: CT GUIDED DRAINAGE OF ANTERIOR PELVIC DIVERTICULAR ABSCESS MEDICATIONS: The patient is currently admitted to the hospital and receiving intravenous antibiotics. The antibiotics were administered within an appropriate time frame prior to the initiation of the procedure.  ANESTHESIA/SEDATION: 50 mcg IV Fentanyl Moderate Sedation Time:  15 MINUTES The patient was continuously monitored during the procedure by the interventional radiology nurse under my direct supervision. COMPLICATIONS: None immediate. TECHNIQUE: Informed written consent was obtained from the patient after a thorough discussion of the procedural risks, benefits and alternatives. All questions were addressed. Maximal Sterile Barrier Technique was utilized including caps, mask, sterile gowns, sterile gloves, sterile drape, hand hygiene and skin antiseptic. A timeout was performed prior to the initiation of the procedure. PROCEDURE: Previous imaging reviewed. Patient positioned supine. Noncontrast localization CT performed. The small abscess between the sigmoid colon and bladder dome was localized. Overlying skin marked for a left abdominal approach. Under sterile conditions and local anesthesia, a 17 gauge 11.8 cm access needle was advanced from a left anterior abdominal oblique approach into the small abscess. Needle position confirmed with CT. Syringe aspiration yielded 7 cc bloody fluid. This completely collapsed the abscess. Sample sent for culture. Needle removed. No immediate complication. Patient tolerated the procedure well. FINDINGS: CT imaging confirms needle placed in the small anterior pelvic diverticular abscess for aspiration IMPRESSION: Successful CT-guided anterior pelvic diverticular abscess drainage by needle aspiration only. 7 cc purulent fluid aspirated which collapsed the abscess. Sample sent for culture. Electronically Signed   By: Jerilynn Mages.  Shick M.D.   On: 12/02/2017 11:13     Medications:   . sodium chloride 50 mL/hr at 12/02/17 2311  . albumin human    . piperacillin-tazobactam (ZOSYN)  IV     . amiodarone  200 mg Oral BID  . apixaban  5 mg Oral BID  . atorvastatin  10 mg Oral QPM  . calcium acetate  1,334 mg Oral TID WC  . cholecalciferol  2,000 Units Oral Daily  . epoetin  (EPOGEN/PROCRIT) injection  4,000 Units Intravenous Q M,W,F-HD  . febuxostat  40 mg Oral QPM  . levothyroxine  25 mcg Oral QAC breakfast  . metoprolol succinate  12.5 mg Oral QPC supper  . midodrine  5 mg Oral Once in dialysis  . montelukast  10 mg Oral QPM  . multivitamin  1 tablet Oral Daily  . pantoprazole  40 mg Oral QAC breakfast  . pantoprazole (PROTONIX) IV  40 mg Intravenous QHS  . umeclidinium-vilanterol  1 puff Inhalation Daily   acetaminophen, albuterol, guaiFENesin **AND** dextromethorphan, diphenhydrAMINE, HYDROmorphone (DILAUDID) injection, ondansetron **OR** ondansetron (ZOFRAN) IV, oxyCODONE, polyethylene glycol  Assessment/ Plan:  76 y.o. 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 12/7 - Patient had aspiration  of diverticulitis related abscess 7 cc of purulent Aspirate was obtained  [facility]/MWF/[address]  1.  End-stage renal disease 2.  Anemia of chronic kidney disease 3.  Secondary hyperparathyroidism 4.  IgA vasculitis with generalized petechial rash-improved  Plan: Next routine hemodialysis will be scheduled on Monday if patient is still in the hospital Hemoglobin is 10.4, will continue Procrit with dialysis We will continue to monitor phosphorus during hospitalization.  Current level 4.9         LOS: 2 Maurice Peterson 12/8/201812:24 PM  [facility] [place], [place]

## 2017-12-03 NOTE — Discharge Summary (Signed)
Physician Discharge Summary  Patient ID: Maurice Peterson MRN: [id number] DOB/AGE: [birth year] 75 y.o.  Admit date: 12/01/2017 Discharge date: 12/03/2017  Admission Diagnoses: Acute diverticulitis with abscess  Discharge Diagnoses:  Active Problems:   Diverticulitis of large intestine with perforation and abscess   Discharged Condition: good  Hospital Course: Patient admitted for management of intraabdominal abscess due to diverticulitis. Abscess was aspirated and drainage was not possible since the cavity was small. Recovered well. No abdominal pain, tolerating diet and antibiotics.   Consults: Interventional radiology  Significant Diagnostic Studies: percutaneous aspiration   Treatments: IV hydration, antibiotics: Zosyn and abscess aspiration  Discharge Exam: Blood pressure (!) 93/59, pulse 98, temperature 98.3 F (36.8 C), temperature source Oral, resp. rate 20, height 5\' 11"  (1.803 m), weight 111.1 kg (244 lb 14.9 oz), SpO2 96 %. General: alert, active oriented Abdomen: soft and depressible, non tender Extremity: bilateral leg edema, no cyanosis  Disposition: 01-Home or Self Care  Discharge Instructions    Call MD for:  difficulty breathing, headache or visual disturbances   Complete by:  As directed    Call MD for:  extreme fatigue   Complete by:  As directed    Call MD for:  hives   Complete by:  As directed    Call MD for:  persistant dizziness or light-headedness   Complete by:  As directed    Call MD for:  persistant nausea and vomiting   Complete by:  As directed    Call MD for:  redness, tenderness, or signs of infection (pain, swelling, redness, odor or green/yellow discharge around incision site)   Complete by:  As directed    Call MD for:  severe uncontrolled pain   Complete by:  As directed    Call MD for:  temperature >100.4   Complete by:  As directed    Increase activity slowly   Complete by:  As directed      Allergies as of 12/03/2017      Reactions    Nsaids Nausea And Vomiting   Anti-inflammatories.   Amoxicillin-pot Clavulanate Nausea And Vomiting   Has patient had a PCN reaction causing immediate rash, facial/tongue/throat swelling, SOB or lightheadedness with hypotension: No Has patient had a PCN reaction causing severe rash involving mucus membranes or skin necrosis: No Has patient had a PCN reaction that required hospitalization:Yes-vomiting blood Has patient had a PCN reaction occurring within the last 10 years: Yes If all of the above answers are "NO", then may proceed with Cephalosporin use.      Medication List    TAKE these medications   acetaminophen 500 MG tablet Commonly known as:  TYLENOL Take 1,000 mg by mouth daily as needed for moderate pain or headache.   albuterol 108 (90 Base) MCG/ACT inhaler Commonly known as:  PROVENTIL HFA;VENTOLIN HFA Inhale 2 puffs into the lungs every 4 (four) hours as needed for wheezing or shortness of breath.   amiodarone 200 MG tablet Commonly known as:  PACERONE Take 400 mg (2 tab) by mouth twice a day for 1 week  then taken 200 mg (1 tab) by mouth  Twice a day What changed:    how much to take  how to take this  when to take this  additional instructions   ANORO ELLIPTA 62.5-25 MCG/INH Aepb Generic drug:  umeclidinium-vilanterol Inhale 1 puff into the lungs daily.   apixaban 5 MG Tabs tablet Commonly known as:  ELIQUIS Take 1 tablet (5 mg total) by mouth  2 (two) times daily.   atorvastatin 10 MG tablet Commonly known as:  LIPITOR Take 10 mg by mouth every evening.   azelastine 0.05 % ophthalmic solution Commonly known as:  OPTIVAR Place 1 drop into both eyes 2 (two) times daily as needed (allergies).   calcium acetate 667 MG capsule Commonly known as:  PHOSLO Take 1,334 mg by mouth 3 (three) times daily with meals.   ciprofloxacin 500 MG tablet Commonly known as:  CIPRO Take 1 tablet (500 mg total) by mouth 2 (two) times daily for 10 days.    dextromethorphan-guaiFENesin 30-600 MG 12hr tablet Commonly known as:  MUCINEX DM Take 1 tablet by mouth at bedtime as needed for cough.   febuxostat 40 MG tablet Commonly known as:  ULORIC Take 40 mg by mouth every evening.   levothyroxine 25 MCG tablet Commonly known as:  SYNTHROID Take 1 tablet (25 mcg total) by mouth daily before breakfast.   metoprolol succinate 25 MG 24 hr tablet Commonly known as:  TOPROL-XL Take 0.5 tablets (12.5 mg total) by mouth daily. Take with or immediately following a meal. What changed:    when to take this  additional instructions   metroNIDAZOLE 500 MG tablet Commonly known as:  FLAGYL Take 1 tablet (500 mg total) by mouth 3 (three) times daily for 10 days.   montelukast 10 MG tablet Commonly known as:  SINGULAIR Take 10 mg by mouth every evening.   multivitamin Tabs tablet Take 1 tablet by mouth daily.   nystatin powder Commonly known as:  MYCOSTATIN/NYSTOP Nystatin 100,000 units per mL: takes 64mL four times a day for 10 days.   pantoprazole 40 MG tablet Commonly known as:  PROTONIX Take 40 mg by mouth daily before breakfast.   Vitamin D3 2000 units Tabs Take 2,000 Units by mouth daily.   ZZZQUIL PO Take 25 mg by mouth at bedtime as needed (sleep).        Signed: Herbert Pun 12/03/2017, 12:49 PM

## 2017-12-03 NOTE — Progress Notes (Signed)
ANTIBIOTIC CONSULT NOTE - Follow Up  Pharmacy Consult for Zosyn Indication: intraabdominal infection  Allergies  Allergen Reactions  . Nsaids Nausea And Vomiting    Anti-inflammatories.  . Amoxicillin-Pot Clavulanate Nausea And Vomiting    Has patient had a PCN reaction causing immediate rash, facial/tongue/throat swelling, SOB or lightheadedness with hypotension: No Has patient had a PCN reaction causing severe rash involving mucus membranes or skin necrosis: No Has patient had a PCN reaction that required hospitalization:Yes-vomiting blood Has patient had a PCN reaction occurring within the last 10 years: Yes If all of the above answers are "NO", then may proceed with Cephalosporin use.     Patient Measurements: Height: 5\' 11"  (180.3 cm) Weight: 244 lb 14.9 oz (111.1 kg) IBW/kg (Calculated) : 75.3   Labs: Recent Labs    12/01/17 1706 12/02/17 0417 12/03/17 0412  WBC 11.6* 9.6 7.5  HGB 10.8* 10.4* 10.4*  PLT 277 237 242  CREATININE 4.48* 5.53* 3.81*   Estimated Creatinine Clearance: 21.2 mL/min (A) (by C-G formula based on SCr of 3.81 mg/dL (H)).  Microbiology: Recent Results (from the past 720 hour(s))  MRSA PCR Screening     Status: None   Collection Time: 12/01/17  6:57 PM  Result Value Ref Range Status   MRSA by PCR NEGATIVE NEGATIVE Final    Comment:        The GeneXpert MRSA Assay (FDA approved for NASAL specimens only), is one component of a comprehensive MRSA colonization surveillance program. It is not intended to diagnose MRSA infection nor to guide or monitor treatment for MRSA infections.   Aerobic/Anaerobic Culture (surgical/deep wound)     Status: None (Preliminary result)   Collection Time: 12/02/17 10:55 AM  Result Value Ref Range Status   Specimen Description ABSCESS PELVIS  Final   Special Requests Normal  Final   Gram Stain   Final    ABUNDANT WBC PRESENT, PREDOMINANTLY PMN ABUNDANT GRAM POSITIVE COCCI ABUNDANT GRAM NEGATIVE  RODS Performed at [facility], [address], [place], [place] [address]    Culture PENDING  Incomplete   Report Status PENDING  Incomplete    Assessment: Pharmacy to dose and monitor Zosyn in this 76 year old male with intra-abdominal infection. Currently ordered Zosyn 3.375 g IV q12 hours.   Plan:  Will transition to [facility] dosing based on improved renal function (CrCl >20 ml/min).  Pharmacy will continue to monitor.    Jacqueline Spofford K, RPH 12/03/2017,10:30 AM

## 2017-12-03 NOTE — Progress Notes (Signed)
[facility] [place] NAME: Maurice Peterson    MR#:  [id number]  DATE OF BIRTH:  1941-08-08  SUBJECTIVE:  Denies abdominal pain or nausea  REVIEW OF SYSTEMS:    Review of Systems  Constitutional: Negative for fever, chills weight loss HENT: Negative for ear pain, nosebleeds, congestion, facial swelling, rhinorrhea, neck pain, neck stiffness and ear discharge.   Respiratory: Negative for cough, shortness of breath, wheezing  Cardiovascular: Negative for chest pain, palpitations and leg swelling.  Gastrointestinal: Negative for heartburn, abdominal pain, vomiting, diarrhea or consitpation Genitourinary: Negative for dysuria, urgency, frequency, hematuria Musculoskeletal: Negative for back pain or joint pain Neurological: Negative for dizziness, seizures, syncope, focal weakness,  numbness and headaches.  Hematological: Does not bruise/bleed easily.  Psychiatric/Behavioral: Negative for hallucinations, confusion, dysphoric mood    Tolerating Diet: FLD  DRUG ALLERGIES:   Allergies  Allergen Reactions  . Nsaids Nausea And Vomiting    Anti-inflammatories.  . Amoxicillin-Pot Clavulanate Nausea And Vomiting    Has patient had a PCN reaction causing immediate rash, facial/tongue/throat swelling, SOB or lightheadedness with hypotension: No Has patient had a PCN reaction causing severe rash involving mucus membranes or skin necrosis: No Has patient had a PCN reaction that required hospitalization:Yes-vomiting blood Has patient had a PCN reaction occurring within the last 10 years: Yes If all of the above answers are "NO", then may proceed with Cephalosporin use.     VITALS:  Blood pressure (!) 93/59, pulse 98, temperature 98.3 F (36.8 C), temperature source Oral, resp. rate 20, height 5\' 11"  (1.803 m), weight 111.1 kg (244 lb 14.9 oz), SpO2 96 %.  PHYSICAL EXAMINATION:  Constitutional: Appears well-developed and well-nourished. No  distress. HENT: Normocephalic. [name] Oropharynx is clear and moist.  Eyes: Conjunctivae and EOM are normal. PERRLA, no scleral icterus.  Neck: Normal ROM. Neck supple. No JVD. No tracheal deviation. CVS: RRR, S1/S2 +, no murmurs, no gallops, no carotid bruit.  Pulmonary: Effort and breath sounds normal, no stridor, rhonchi, wheezes, rales.  Abdominal: Soft. BS +,  no distension, tenderness, rebound or guarding.  Musculoskeletal: Normal range of motion. No edema and no tenderness.  Neuro: Alert. CN 2-12 grossly intact. No focal deficits. Skin: Skin is warm and dry. No rash noted. Psychiatric: Normal mood and affect.   LABORATORY PANEL:   CBC Recent Labs  Lab 12/03/17 0412  WBC 7.5  HGB 10.4*  HCT 31.1*  PLT 242   ------------------------------------------------------------------------------------------------------------------  Chemistries  Recent Labs  Lab 12/02/17 0417 12/03/17 0412  NA 135 139  K 4.5 4.2  CL 95* 100*  CO2 27 29  GLUCOSE 84 88  BUN 26* 14  CREATININE 5.53* 3.81*  CALCIUM 8.3* 8.3*  MG 2.0  --    ------------------------------------------------------------------------------------------------------------------  Cardiac Enzymes No results for input(s): TROPONINI in the last 168 hours. ------------------------------------------------------------------------------------------------------------------  RADIOLOGY:  Ct Image Guided Drainage By Percutaneous Catheter  Result Date: 12/02/2017 INDICATION: Small anterior pelvic diverticular abscess EXAM: CT GUIDED DRAINAGE OF ANTERIOR PELVIC DIVERTICULAR ABSCESS MEDICATIONS: The patient is currently admitted to the hospital and receiving intravenous antibiotics. The antibiotics were administered within an appropriate time frame prior to the initiation of the procedure. ANESTHESIA/SEDATION: 50 mcg IV Fentanyl Moderate Sedation Time:  15 MINUTES The patient was continuously monitored during the procedure by the  interventional radiology nurse under my direct supervision. COMPLICATIONS: None immediate. TECHNIQUE: Informed written consent was obtained from the patient after a thorough discussion of the procedural risks, benefits and alternatives.  All questions were addressed. Maximal Sterile Barrier Technique was utilized including caps, mask, sterile gowns, sterile gloves, sterile drape, hand hygiene and skin antiseptic. A timeout was performed prior to the initiation of the procedure. PROCEDURE: Previous imaging reviewed. Patient positioned supine. Noncontrast localization CT performed. The small abscess between the sigmoid colon and bladder dome was localized. Overlying skin marked for a left abdominal approach. Under sterile conditions and local anesthesia, a 17 gauge 11.8 cm access needle was advanced from a left anterior abdominal oblique approach into the small abscess. Needle position confirmed with CT. Syringe aspiration yielded 7 cc bloody fluid. This completely collapsed the abscess. Sample sent for culture. Needle removed. No immediate complication. Patient tolerated the procedure well. FINDINGS: CT imaging confirms needle placed in the small anterior pelvic diverticular abscess for aspiration IMPRESSION: Successful CT-guided anterior pelvic diverticular abscess drainage by needle aspiration only. 7 cc purulent fluid aspirated which collapsed the abscess. Sample sent for culture. Electronically Signed   By: Jerilynn Mages.  Shick M.D.   On: 12/02/2017 11:13     ASSESSMENT AND PLAN:   76 year old male with chronic systolic heart failure ejection fraction 35-40%, end-stage renal disease on hemodialysis, PAF who is directly admitted due to CT scan showing diverticular abscess.   1. Diverticulitis with abscess: s/p CT guided drainage of abscess. Pt doing well Continue Zosyn Tolerating FLD  2. Chronic ischemic cardiomyopathy with EF 35-40%  There are no signs of exacerbation  3. End-stage renal disease: Nephrology  consultation for dialysis  4. PAF: resuming Eliquis for now--d/w surgery Continue Amiodarone and metoprolol for heart rate control  5. Hypothyroidism: Continue Synthroid  Pt will likely go home later today Will sign off   Management plans discussed with the patient and he is in agreement.  CODE STATUS: partial DNI TOTAL TIME TAKING CARE OF THIS PATIENT: 30 minutes.        Fritzi Mandes M.D on 12/03/2017 at 12:50 PM  Between 7am to 6pm - Pager - [phone] After 6pm go to [email or web address] - password [email or web address] [facility]  Office  [phone]  CC: Primary care physician; Baxter Hire, MD  Note: This dictation was prepared with Dragon dictation along with smaller phrase technology. Any transcriptional errors that result from this process are unintentional.

## 2017-12-03 NOTE — Progress Notes (Signed)
Tor Netters to be D/C'd home per MD order.  Discussed prescriptions and follow up appointments with the patient. Prescriptions given to patient, medication list explained in detail. Pt verbalized understanding.  Allergies as of 12/03/2017      Reactions   Nsaids Nausea And Vomiting   Anti-inflammatories.   Amoxicillin-pot Clavulanate Nausea And Vomiting   Has patient had a PCN reaction causing immediate rash, facial/tongue/throat swelling, SOB or lightheadedness with hypotension: No Has patient had a PCN reaction causing severe rash involving mucus membranes or skin necrosis: No Has patient had a PCN reaction that required hospitalization:Yes-vomiting blood Has patient had a PCN reaction occurring within the last 10 years: Yes If all of the above answers are "NO", then may proceed with Cephalosporin use.      Medication List    TAKE these medications   acetaminophen 500 MG tablet Commonly known as:  TYLENOL Take 1,000 mg by mouth daily as needed for moderate pain or headache.   albuterol 108 (90 Base) MCG/ACT inhaler Commonly known as:  PROVENTIL HFA;VENTOLIN HFA Inhale 2 puffs into the lungs every 4 (four) hours as needed for wheezing or shortness of breath.   amiodarone 200 MG tablet Commonly known as:  PACERONE Take 400 mg (2 tab) by mouth twice a day for 1 week  then taken 200 mg (1 tab) by mouth  Twice a day What changed:    how much to take  how to take this  when to take this  additional instructions   ANORO ELLIPTA 62.5-25 MCG/INH Aepb Generic drug:  umeclidinium-vilanterol Inhale 1 puff into the lungs daily.   apixaban 5 MG Tabs tablet Commonly known as:  ELIQUIS Take 1 tablet (5 mg total) by mouth 2 (two) times daily.   atorvastatin 10 MG tablet Commonly known as:  LIPITOR Take 10 mg by mouth every evening.   azelastine 0.05 % ophthalmic solution Commonly known as:  OPTIVAR Place 1 drop into both eyes 2 (two) times daily as needed (allergies).   calcium  acetate 667 MG capsule Commonly known as:  PHOSLO Take 1,334 mg by mouth 3 (three) times daily with meals.   ciprofloxacin 500 MG tablet Commonly known as:  CIPRO Take 1 tablet (500 mg total) by mouth 2 (two) times daily for 10 days.   dextromethorphan-guaiFENesin 30-600 MG 12hr tablet Commonly known as:  MUCINEX DM Take 1 tablet by mouth at bedtime as needed for cough.   febuxostat 40 MG tablet Commonly known as:  ULORIC Take 40 mg by mouth every evening.   levothyroxine 25 MCG tablet Commonly known as:  SYNTHROID Take 1 tablet (25 mcg total) by mouth daily before breakfast.   metoprolol succinate 25 MG 24 hr tablet Commonly known as:  TOPROL-XL Take 0.5 tablets (12.5 mg total) by mouth daily. Take with or immediately following a meal. What changed:    when to take this  additional instructions   metroNIDAZOLE 500 MG tablet Commonly known as:  FLAGYL Take 1 tablet (500 mg total) by mouth 3 (three) times daily for 10 days.   montelukast 10 MG tablet Commonly known as:  SINGULAIR Take 10 mg by mouth every evening.   multivitamin Tabs tablet Take 1 tablet by mouth daily.   nystatin powder Commonly known as:  MYCOSTATIN/NYSTOP Nystatin 100,000 units per mL: takes 59mL four times a day for 10 days.   pantoprazole 40 MG tablet Commonly known as:  PROTONIX Take 40 mg by mouth daily before breakfast.  Vitamin D3 2000 units Tabs Take 2,000 Units by mouth daily.   ZZZQUIL PO Take 25 mg by mouth at bedtime as needed (sleep).       Vitals:   12/03/17 1221 12/03/17 1226  BP: (!) 85/55 (!) 93/59  Pulse: 91 98  Resp: 20   Temp: 98.3 F (36.8 C)   SpO2: 95% 96%    Skin clean, dry and intact without evidence of skin break down, no evidence of skin tears noted. IV catheter discontinued intact. Site without signs and symptoms of complications. Dressing and pressure applied. Pt denies pain at this time. No complaints noted.  An After Visit Summary was printed and  given to the patient. Patient escorted via [place], and D/C home via private auto.  Maurice Peterson A

## 2017-12-07 LAB — AEROBIC/ANAEROBIC CULTURE W GRAM STAIN (SURGICAL/DEEP WOUND)

## 2017-12-07 LAB — AEROBIC/ANAEROBIC CULTURE (SURGICAL/DEEP WOUND): SPECIAL REQUESTS: NORMAL

## 2017-12-07 NOTE — Telephone Encounter (Signed)
error 

## 2017-12-13 ENCOUNTER — Telehealth: Payer: Self-pay | Admitting: Internal Medicine

## 2017-12-13 NOTE — Telephone Encounter (Signed)
Pt calling stating we did a PA on his eliquis  But it seems we only sent it through his secondary insurance [employer]  His Primary insurance is [place] health care medicare   Would like make sure we did everything   Please advise

## 2017-12-13 NOTE — Telephone Encounter (Signed)
I spoke with pt today and he mentioned that a PA was authorized under secondary insurance which is not covering pt's Eliquis.   I don't see in pt's chart where PA was started by our office.  Last Telephone note listed Below concerning PA:  Vanessa Ralphs, RN  Note   In further review of patient's chart, patient's primary cardiologist is Dr Nehemiah Massed. Called Dr Alveria Apley office and confirmed patient is continuing to receive care from Dr Nehemiah Massed. Patient next appointment with him is 12/05/17.      [facility] to notify them that patient's primary cardiologist is Dr Nehemiah Massed and to reach out to his office regarding the prior authorization and to call us back if anything else is needed.        12:32 PM   Vanessa Ralphs, RN contacted Dr Ander Purpura office (Pharmacy)  Vanessa Ralphs, RN    12:20 PM  Note   Spoke with pharmacist at [facility]. He stated the prior auth is suppose to generate automatically; however, we have not received anything at this time. I was provided with number to call help desk for Prior auth [phone].     Pt Mentioned he is not being followed by [facility] office and that he told Caryl Comes @ last OV he wanted him to be his primary Cardiologist. He was very upset and concerned when I asked him was he following with Nehemiah Massed and that PA should have been handled by Primary Cardiologist. He said "No that is not my cardiologist all of my appointments have been cancelled and I no longer have any ties with him!" I don't know who started the prior authorization but someone did and it needs to be fixed because I don't use [employer] as my primary insurance only [employer].    Please advise if PA needs to be started or if you are aware of PA being started because there is no note in pt's chart.

## 2017-12-14 NOTE — Telephone Encounter (Signed)
I did not continue with prior authorization when I talked to pharmacy from encounter dated 11/08/17. Pharmacy originally initiated the prior auth I believe.  At that time patient had f/u appointment with Dr Nehemiah Massed as primary cardiologist. It looks like patient saw Dr Caryl Comes saw patient yesterday. I apologize for the confusion. PA may be started under patient's insurance as desired.

## 2017-12-15 NOTE — Telephone Encounter (Signed)
PA has been submitted with pt primary insurance through [place] [employer]. [employer] is reviewing PA request.  Typically an electronic response will be received within 72 hours.

## 2017-12-15 NOTE — Telephone Encounter (Signed)
Pt is aware that PA was submitted through [employer] awaiting response.

## 2017-12-15 NOTE — Telephone Encounter (Signed)
Pt called back states [employer] is denying the PA, states his primary is [employer]. Please call to discuss

## 2017-12-16 NOTE — Telephone Encounter (Signed)
Received letter from [employer] via fax of Notice of Denial of Medical Coverage for patient's Eliquis. It states patient needs to initiate an appeal.  Attempted to reach patient. No answer at this time. Left message to call us back.

## 2017-12-18 ENCOUNTER — Other Ambulatory Visit: Payer: Self-pay

## 2017-12-18 ENCOUNTER — Emergency Department
Admission: EM | Admit: 2017-12-18 | Discharge: 2017-12-18 | Disposition: A | Discharge status: Home / Self Care | Payer: Medicare Other | Attending: Emergency Medicine | Admitting: Emergency Medicine

## 2017-12-18 DIAGNOSIS — N3091 Cystitis, unspecified with hematuria: Secondary | ICD-10-CM | POA: Insufficient documentation

## 2017-12-18 DIAGNOSIS — Z87891 Personal history of nicotine dependence: Secondary | ICD-10-CM | POA: Insufficient documentation

## 2017-12-18 DIAGNOSIS — N186 End stage renal disease: Secondary | ICD-10-CM | POA: Diagnosis not present

## 2017-12-18 DIAGNOSIS — R310 Gross hematuria: Secondary | ICD-10-CM | POA: Diagnosis not present

## 2017-12-18 DIAGNOSIS — Z7901 Long term (current) use of anticoagulants: Secondary | ICD-10-CM | POA: Insufficient documentation

## 2017-12-18 DIAGNOSIS — J449 Chronic obstructive pulmonary disease, unspecified: Secondary | ICD-10-CM | POA: Diagnosis not present

## 2017-12-18 DIAGNOSIS — N3090 Cystitis, unspecified without hematuria: Secondary | ICD-10-CM

## 2017-12-18 DIAGNOSIS — I120 Hypertensive chronic kidney disease with stage 5 chronic kidney disease or end stage renal disease: Secondary | ICD-10-CM | POA: Insufficient documentation

## 2017-12-18 DIAGNOSIS — Z992 Dependence on renal dialysis: Secondary | ICD-10-CM | POA: Insufficient documentation

## 2017-12-18 DIAGNOSIS — I2510 Atherosclerotic heart disease of native coronary artery without angina pectoris: Secondary | ICD-10-CM | POA: Diagnosis not present

## 2017-12-18 DIAGNOSIS — Z79899 Other long term (current) drug therapy: Secondary | ICD-10-CM | POA: Diagnosis not present

## 2017-12-18 DIAGNOSIS — Z955 Presence of coronary angioplasty implant and graft: Secondary | ICD-10-CM | POA: Diagnosis not present

## 2017-12-18 LAB — COMPREHENSIVE METABOLIC PANEL
ALT: 23 U/L (ref 17–63)
AST: 32 U/L (ref 15–41)
Albumin: 3.4 g/dL — ABNORMAL LOW (ref 3.5–5.0)
Alkaline Phosphatase: 60 U/L (ref 38–126)
Anion gap: 14 (ref 5–15)
BILIRUBIN TOTAL: 0.9 mg/dL (ref 0.3–1.2)
BUN: 37 mg/dL — AB (ref 6–20)
CALCIUM: 8.9 mg/dL (ref 8.9–10.3)
CO2: 28 mmol/L (ref 22–32)
CREATININE: 7.81 mg/dL — AB (ref 0.61–1.24)
Chloride: 95 mmol/L — ABNORMAL LOW (ref 101–111)
GFR calc Af Amer: 7 mL/min — ABNORMAL LOW (ref >60)
GFR, EST NON AFRICAN AMERICAN: 6 mL/min — AB (ref >60)
Glucose, Bld: 89 mg/dL (ref 65–99)
POTASSIUM: 4.7 mmol/L (ref 3.5–5.1)
Sodium: 137 mmol/L (ref 135–145)
TOTAL PROTEIN: 7.3 g/dL (ref 6.5–8.1)

## 2017-12-18 LAB — URINALYSIS, COMPLETE (UACMP) WITH MICROSCOPIC
Bacteria, UA: NONE SEEN
SQUAMOUS EPITHELIAL / LPF: NONE SEEN
WBC, UA: NONE SEEN WBC/hpf (ref 0–5)

## 2017-12-18 LAB — CBC WITH DIFFERENTIAL/PLATELET
BASOS PCT: 1 %
Basophils Absolute: 0.1 10*3/uL (ref 0–0.1)
EOS ABS: 0.1 10*3/uL (ref 0–0.7)
Eosinophils Relative: 1 %
HCT: 35.8 % — ABNORMAL LOW (ref 40.0–52.0)
HEMOGLOBIN: 11.5 g/dL — AB (ref 13.0–18.0)
Lymphocytes Relative: 15 %
Lymphs Abs: 1.5 10*3/uL (ref 1.0–3.6)
MCH: 30 pg (ref 26.0–34.0)
MCHC: 32.1 g/dL (ref 32.0–36.0)
MCV: 93.4 fL (ref 80.0–100.0)
MONOS PCT: 11 %
Monocytes Absolute: 1.1 10*3/uL — ABNORMAL HIGH (ref 0.2–1.0)
NEUTROS PCT: 72 %
Neutro Abs: 7 10*3/uL — ABNORMAL HIGH (ref 1.4–6.5)
PLATELETS: 203 10*3/uL (ref 150–440)
RBC: 3.84 MIL/uL — ABNORMAL LOW (ref 4.40–5.90)
RDW: 18.7 % — AB (ref 11.5–14.5)
WBC: 9.9 10*3/uL (ref 3.8–10.6)

## 2017-12-18 LAB — LACTIC ACID, PLASMA
LACTIC ACID, VENOUS: 2.3 mmol/L — AB (ref 0.5–1.9)
LACTIC ACID, VENOUS: 1.2 mmol/L (ref 0.5–1.9)

## 2017-12-18 MED ORDER — CIPROFLOXACIN HCL 500 MG PO TABS: ORAL_TABLET | ORAL | 0 refills | Status: DC

## 2017-12-18 MED ORDER — SODIUM CHLORIDE 0.9 % IV BOLUS (SEPSIS)
500.0000 mL | Freq: Once | INTRAVENOUS | Status: AC
  Administered 2017-12-18: 500 mL via INTRAVENOUS

## 2017-12-18 NOTE — ED Provider Notes (Signed)
[facility] Emergency Department Provider Note ____________________________________________   I have reviewed the triage vital signs and the triage nursing note.  HISTORY  Chief Complaint Hematuria   Historian Patient  HPI Maurice Peterson is a 76 y.o. 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  Typically makes urine twice per day.  No fevers.  No fatigue.  No systemic symptoms.  No nausea vomiting or sweats.  Reports he had a abdominal abscess which was lanced by a surgeon about a week and a half ago when he just finished antibiotics for that and everything looks healed up.    Review of chart: diverticular abscess drained by IR 12/8.  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  Per patient no additional work up for that.  Reports mild abdominal fullness, but not like it was prior to the abscess drainage - feels much better.   Past Medical History:  Diagnosis Date  . Arthritis   . Atrial flutter ([place])    a. s/p DCCV 07/2017 briefly successful; b. amio and warfarin; c. CHADS2VASc => 5 (CHF, HTN, age x 2, vascular disease);  [name] CAD (coronary artery disease)    a. remote inferior MI s/p stenting; b. Myoview in 10/2016 with normal perfusion with an EF of 46%  . COPD (chronic obstructive pulmonary disease) ([place])   . ESRD (end stage renal disease) on dialysis ([place])    a. HD MWF  . GERD (gastroesophageal reflux disease)   . Hyperlipidemia   . Hypertension   . Ischemic cardiomyopathy    a. TTE 05/2017: EF 35-40%, unable to exlcude RWMA, mild MR, mildly dilated RV  . Prostate cancer ([place])   . Shortness of breath dyspnea     Patient Active Problem List   Diagnosis Date Noted  . 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 Atypical atrial flutter ([place])   . Sepsis ([place]) 10/19/2017  . Respiratory failure ([place]) 06/20/2017  . COPD exacerbation ([place]) 06/20/2017  . Atrial flutter with rapid ventricular response ([place]) 06/17/2017  . End stage renal disease ([place]) 11/08/2016  . Complication of vascular access for dialysis 11/08/2016  . Essential hypertension, benign 11/08/2016  . Hypercholesterolemia 11/08/2016    Past Surgical History:  Procedure Laterality Date  . A/V SHUNT INTERVENTION Left 10/24/2017   Procedure: A/V SHUNT INTERVENTION;  Surgeon: Algernon Huxley, MD;  Location: [place] [facility];  Service: Cardiovascular;  Laterality: Left;  . CARDIOVERSION N/A 07/26/2017   Procedure: Cardioversion;  Surgeon: Corey Skains, MD;  Location: [facility];  Service: Cardiovascular;  Laterality: N/A;  . CARDIOVERSION N/A 08/18/2017   Procedure: CARDIOVERSION;  Surgeon: Corey Skains, MD;  Location: [facility];  Service: Cardiovascular;  Laterality: N/A;  . CARDIOVERSION N/A 10/21/2017   Procedure: CARDIOVERSION;  Surgeon: Wellington Hampshire, MD;  Location: [facility];  Service: Cardiovascular;  Laterality: N/A;  . CARDIOVERSION N/A 11/25/2017   Procedure: CARDIOVERSION;  Surgeon: Wellington Hampshire, MD;  Location: [facility];  Service: Cardiovascular;  Laterality: N/A;  . CATARACT EXTRACTION W/PHACO Right 08/20/2015   Procedure: CATARACT EXTRACTION PHACO AND INTRAOCULAR LENS PLACEMENT ([place]);  Surgeon: Leandrew Koyanagi, MD;  Location: [place];  Service: Ophthalmology;  Laterality: Right;  . CATARACT EXTRACTION W/PHACO Left 09/10/2015   Procedure: CATARACT EXTRACTION  PHACO AND INTRAOCULAR LENS PLACEMENT (I[facility]);  Surgeon: Leandrew Koyanagi, MD;  Location: [place];  Service: Ophthalmology;  Laterality: Left;  . CORONARY STENT PLACEMENT    . 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 PERIPHERAL VASCULAR CATHETERIZATION Left 05/20/2015   Procedure: A/V Shuntogram/Fistulagram;   Surgeon: Katha Cabal, MD;  Location: [place] [facility];  Service: Cardiovascular;  Laterality: Left;  . PERIPHERAL VASCULAR CATHETERIZATION N/A 05/20/2015   Procedure: A/V Shunt Intervention;  Surgeon: Katha Cabal, MD;  Location: [place] [facility];  Service: Cardiovascular;  Laterality: N/A;  . PERIPHERAL VASCULAR CATHETERIZATION Left 08/05/2015   Procedure: A/V Shuntogram/Fistulagram;  Surgeon: Katha Cabal, MD;  Location: [place] [facility];  Service: Cardiovascular;  Laterality: Left;  . PERIPHERAL VASCULAR CATHETERIZATION N/A 08/05/2015   Procedure: A/V Shunt Intervention;  Surgeon: Katha Cabal, MD;  Location: [facility] [facility];  Service: Cardiovascular;  Laterality: N/A;  . PERIPHERAL VASCULAR CATHETERIZATION Left 01/06/2016   Procedure: A/V Shuntogram/Fistulagram;  Surgeon: Katha Cabal, MD;  Location: [facility];  Service: Cardiovascular;  Laterality: Left;  . PERIPHERAL VASCULAR CATHETERIZATION N/A 01/06/2016   Procedure: A/V Shunt Intervention;  Surgeon: Katha Cabal, MD;  Location: [place] [facility];  Service: Cardiovascular;  Laterality: N/A;  . PERIPHERAL VASCULAR CATHETERIZATION N/A 12/13/2016   Procedure: Dialysis/Perma Catheter Removal;  Surgeon: Algernon Huxley, MD;  Location: [facility] [facility];  Service: Cardiovascular;  Laterality: N/A;    Prior to Admission medications   Medication Sig Start Date End Date Taking? Authorizing Provider  acetaminophen (TYLENOL) 500 MG tablet Take 1,000 mg by mouth daily as needed for moderate pain or headache.    [provider]  albuterol (PROVENTIL HFA;VENTOLIN HFA) 108 (90 BASE) MCG/ACT inhaler Inhale 2 puffs into the lungs every 4 (four) hours as needed for wheezing or shortness of breath.    [provider]  amiodarone (PACERONE) 200 MG tablet Take 400 mg (2 tab) by mouth twice a day for 1 week  then taken 200 mg (1 tab) by mouth  Twice a day Patient taking differently: Take  200 mg by mouth 2 (two) times daily.  10/21/17   Fritzi Mandes, MD  apixaban (ELIQUIS) 5 MG TABS tablet Take 1 tablet (5 mg total) by mouth 2 (two) times daily. 10/26/17   Fritzi Mandes, MD  atorvastatin (LIPITOR) 10 MG tablet Take 10 mg by mouth every evening.     [provider]  azelastine (OPTIVAR) 0.05 % ophthalmic solution Place 1 drop into both eyes 2 (two) times daily as needed (allergies).    [provider]  calcium acetate (PHOSLO) 667 MG capsule Take 1,334 mg by mouth 3 (three) times daily with meals.    [provider]  Cholecalciferol (VITAMIN D3) 2000 units TABS Take 2,000 Units by mouth daily.     [provider]  dextromethorphan-guaiFENesin (MUCINEX DM) 30-600 MG 12hr tablet Take 1 tablet by mouth at bedtime as needed for cough.    [provider]  DiphenhydrAMINE HCl, Sleep, (ZZZQUIL PO) Take 25 mg by mouth at bedtime as needed (sleep).     [provider]  febuxostat (ULORIC) 40 MG tablet Take 40 mg by mouth every evening.     [provider]  levothyroxine (SYNTHROID, LEVOTHROID) 25 MCG tablet Take 1 tablet (25 mcg total) by mouth daily before breakfast. 11/22/17   Deboraha Sprang, MD  metoprolol succinate (TOPROL-XL) 25 MG 24  hr tablet Take 0.5 tablets (12.5 mg total) by mouth daily. Take with or immediately following a meal. Patient taking differently: Take 12.5 mg by mouth daily after supper. Take with or immediately following a meal. 11/22/17 02/20/18  Deboraha Sprang, MD  montelukast (SINGULAIR) 10 MG tablet Take 10 mg by mouth every evening.     [provider]  multivitamin (RENA-VIT) TABS tablet Take 1 tablet by mouth daily.    [provider]  nystatin (MYCOSTATIN/NYSTOP) powder Nystatin 100,000 units per mL: takes 71mL four times a day for 10 days. 12/03/17   Herbert Pun, MD  pantoprazole (PROTONIX) 40 MG tablet Take 40 mg by mouth daily before breakfast.     [provider]   umeclidinium-vilanterol (ANORO ELLIPTA) 62.5-25 MCG/INH AEPB Inhale 1 puff into the lungs daily.    [provider]    Allergies  Allergen Reactions  . Nsaids Nausea And Vomiting    Anti-inflammatories.  . Amoxicillin-Pot Clavulanate Nausea And Vomiting    Has patient had a PCN reaction causing immediate rash, facial/tongue/throat swelling, SOB or lightheadedness with hypotension: No Has patient had a PCN reaction causing severe rash involving mucus membranes or skin necrosis: No Has patient had a PCN reaction that required hospitalization:Yes-vomiting blood Has patient had a PCN reaction occurring within the last 10 years: Yes If all of the above answers are "NO", then may proceed with Cephalosporin use.     Family History  Problem Relation Age of Onset  . Liver disease Mother   . Heart disease Mother   . Hypertension Mother   . Pancreatic cancer Father     Social History Social History   Tobacco Use  . Smoking status: Former Smoker    Packs/day: 1.00    Years: 30.00    Pack years: 30.00    Types: Cigarettes    Last attempt to quit: 05/19/2009    Years since quitting: 8.5  . Smokeless tobacco: Never Used  Substance Use Topics  . Alcohol use: No  . Drug use: No    Review of Systems  Constitutional: Negative for fever. Eyes: Negative for visual changes. ENT: Negative for sore throat. Cardiovascular: Negative for chest pain. Respiratory: Negative for shortness of breath. Gastrointestinal: Negative for  vomiting and diarrhea. Genitourinary: Negative for dysuria, but positive for gross hematuria as per hpi. Musculoskeletal: Negative for back pain. Skin: Negative for rash. Neurological: Negative for headache.  ____________________________________________   PHYSICAL EXAM:  VITAL SIGNS: ED Triage Vitals  Enc Vitals Group     BP 12/18/17 0554 107/67     Pulse Rate 12/18/17 0554 97     Resp 12/18/17 0554 18     Temp 12/18/17 0554 98.9 F (37.2 C)      Temp Source 12/18/17 0554 Oral     SpO2 12/18/17 0554 94 %     Weight 12/18/17 0522 235 lb (106.6 kg)     Height 12/18/17 0522 5\' 10"  (1.778 m)     Head Circumference --      Peak Flow --      Pain Score 12/18/17 0521 3     Pain Loc --      Pain Edu? --      Excl. in [place]? --      Constitutional: Alert and oriented. Well appearing and in no distress. HEENT   Head: Normocephalic and atraumatic.      Eyes: Conjunctivae are normal. Pupils equal and round.       Ears:  Nose: No congestion/rhinnorhea.   Mouth/Throat: Mucous membranes are moist.   Neck: No stridor. Cardiovascular/Chest: Normal rate, regular rhythm.  No murmurs, rubs, or gallops. Respiratory: Normal respiratory effort without tachypnea nor retractions. Breath sounds are clear and equal bilaterally. No wheezes/rales/rhonchi. Gastrointestinal: Soft. No distention, no guarding, no rebound. Nontender.  Skin access point at left low abdomen is normal and healed and nontender.  Genitourinary/rectal:Deferred Musculoskeletal: Nontender with normal range of motion in all extremities. No joint effusions.  No lower extremity tenderness.  No edema. Neurologic:  Normal speech and language. No gross or focal neurologic deficits are appreciated. Skin:  Skin is warm, dry and intact. No rash noted. Psychiatric: Mood and affect are normal. Speech and behavior are normal. Patient exhibits appropriate insight and judgment.   ____________________________________________  LABS (pertinent positives/negatives) I, Lisa Roca, MD the attending physician have reviewed the labs noted below.  Labs Reviewed  COMPREHENSIVE METABOLIC PANEL - Abnormal; Notable for the following components:      Result Value   Chloride 95 (*)    BUN 37 (*)    Creatinine, Ser 7.81 (*)    Albumin 3.4 (*)    GFR calc non Af Amer 6 (*)    GFR calc Af Amer 7 (*)    All other components within normal limits  CBC WITH DIFFERENTIAL/PLATELET - Abnormal;  Notable for the following components:   RBC 3.84 (*)    Hemoglobin 11.5 (*)    HCT 35.8 (*)    RDW 18.7 (*)    Neutro Abs 7.0 (*)    Monocytes Absolute 1.1 (*)    All other components within normal limits  LACTIC ACID, PLASMA - Abnormal; Notable for the following components:   Lactic Acid, Venous 2.3 (*)    All other components within normal limits  URINALYSIS, COMPLETE (UACMP) WITH MICROSCOPIC - Abnormal; Notable for the following components:   Color, Urine RED (*)    APPearance GROSSLY BLOODY (*)    Glucose, UA   (*)    Value: TEST NOT REPORTED DUE TO COLOR INTERFERENCE OF URINE PIGMENT   Hgb urine dipstick   (*)    Value: TEST NOT REPORTED DUE TO COLOR INTERFERENCE OF URINE PIGMENT   Bilirubin Urine   (*)    Value: TEST NOT REPORTED DUE TO COLOR INTERFERENCE OF URINE PIGMENT   Ketones, ur   (*)    Value: TEST NOT REPORTED DUE TO COLOR INTERFERENCE OF URINE PIGMENT   Protein, ur   (*)    Value: TEST NOT REPORTED DUE TO COLOR INTERFERENCE OF URINE PIGMENT   Nitrite   (*)    Value: TEST NOT REPORTED DUE TO COLOR INTERFERENCE OF URINE PIGMENT   Leukocytes, UA   (*)    Value: TEST NOT REPORTED DUE TO COLOR INTERFERENCE OF URINE PIGMENT   All other components within normal limits  URINE CULTURE  CULTURE, BLOOD (ROUTINE X 2)  CULTURE, BLOOD (ROUTINE X 2)  LACTIC ACID, PLASMA    ____________________________________________    EKG I, Lisa Roca, MD, the attending physician have personally viewed and interpreted all ECGs.  None ____________________________________________  RADIOLOGY All Xrays were viewed by me.  Imaging interpreted by Radiologist, and I, Lisa Roca, MD the attending physician have reviewed the radiologist interpretation noted below.  None __________________________________________  PROCEDURES  Procedure(s) performed: None  Critical Care performed: None   ____________________________________________  ED COURSE / ASSESSMENT AND  PLAN  Pertinent labs & imaging results that were available during my care  of the patient were reviewed by me and considered in my medical decision making (see chart for details).    We discussed most likely causes of gross hematuria would be a urinary tract infection versus idiopathic since he is on Eliquis.  He is on Eliquis for a flutter/A. fib and we discussed risk versus benefit and chose to hold off and discontinue the blood thinner for 2-3 days or 24 hours after he is not had a bloody urine.  His urinalysis did not have white blood cells or bacteria, however given high percentage of hematuria being associate with urinary tract infection, plus his risk for urinary tract infection with only urinating twice per day, plus left shift without elevated white blood cell count, and low blood pressure although patient states that his blood pressure is typically in the 90s, we will go ahead and cover with antibiotic.  I do not think the patient is septic.  He is overall well-appearing.  His lactate was minimally elevated 2.3, which I suspect is more slight dehydration related.  I am been given 500 cc bolus of fluid here just before he goes to dialysis today.  Blood cultures were sent out of an abundance of caution as well.  I reviewed the patient's chart and saw there was potential concern for colonic vesicular fistula based on early December CT scan.  Patient is not complaining of any real new abdominal pain, and again is not febrile, and right now I do not have a high suspicion that his gross hematuria is coming from any sort of surgical complication or a colonic vesicular fistula.  Will be discharged to go to his dialysis today.  Instructed to take cipro after dialysis.  Due to ESRD dose adjusted to once daily, and after dialysis.  DIFFERENTIAL DIAGNOSIS: Including but not limited to urinary tract infection, idiopathic hematuria on blood thinner, diverticular bleed through fascicular urinary  fistula, trauma, etc.  CONSULTATIONS:   None   Patient / Family / Caregiver informed of clinical course, medical decision-making process, and agree with plan.   I discussed return precautions, follow-up instructions, and discharge instructions with patient and/or family.  Discharge Instructions : You are evaluated for blood in the urine, and although no certain cause was found, we discussed you are being treated for possible urinary tract infection, and I am recommending that you also be off of your blood thinner Eliquis for 2-3 days, or at least 24 hours after you have had any blood in the urine in order to help the bleeding stopped.  Return to the emergency room immediately for any worsening condition including abdominal pain, fever, dizziness or passing out, or any other symptoms concerning to you.    ___________________________________________   FINAL CLINICAL IMPRESSION(S) / ED DIAGNOSES   Final diagnoses:  Gross hematuria  Cystitis      ___________________________________________        Note: This dictation was prepared with Dragon dictation. Any transcriptional errors that result from this process are unintentional    Lisa Roca, MD 12/18/17 [phone]

## 2017-12-18 NOTE — ED Notes (Signed)
Pt reports he only voids twice a day, dialysis patient; last night before bed and this morning when he voided he noticed blood in his urine; pt is on Eloquis

## 2017-12-18 NOTE — Discharge Instructions (Signed)
You are evaluated for blood in the urine, and although no certain cause was found, we discussed you are being treated for possible urinary tract infection, and I am recommending that you also be off of your blood thinner Eliquis for 2-3 days, or at least 24 hours after you have had any blood in the urine in order to help the bleeding stopped.  Return to the emergency room immediately for any worsening condition including abdominal pain, fever, dizziness or passing out, or any other symptoms concerning to you.

## 2017-12-18 NOTE — ED Triage Notes (Signed)
Pt says he gets dialysis M-W-F; due to holiday he had gotten up this am for his appointment; pt says he normally only voids twice a day, once in the morning and once at night; pt says last night and this am he noticed blood in his urine; pt is on Eloquis; pt reports some discomfort in the groin area; pt adds he had a procedure about a week ago where he had a needle placed in his abd to drain a "pus pocket";  Pt says he was told Friday by nephrologist that he may have a hole in his bladder from that procedure; pt is unsure why his nephrologist told him this

## 2017-12-19 LAB — URINE CULTURE

## 2017-12-19 NOTE — Telephone Encounter (Signed)
S/w patient. He stated that [facility] is not his precription drug carrier. He needs PA submitted via [employer]. I apologized for the confusion and frustration of submitting this request. He was very understanding.   I initiated request for PA through [employer] on [email or web address]. KEy [id number]. The request has been submitted and received the following message: "[employer] is processing your PA request and will respond shortly with next steps. You are currently using the fastest method to process this prior authorization. Please do not fax or call [employer] to resubmit this request. To check for an update later, open this request again from your dashboard."

## 2017-12-19 NOTE — Telephone Encounter (Signed)
PA submitted via [email or web address] for [employer].  Received immediate APPROVAL from 11/19/2017 to 12/19/2018. Case ID [id number].   Patient notified that request was approved. He was very Patent attorney.

## 2017-12-23 LAB — CULTURE, BLOOD (ROUTINE X 2)
CULTURE: NO GROWTH
CULTURE: NO GROWTH

## 2017-12-28 DIAGNOSIS — R319 Hematuria, unspecified: Secondary | ICD-10-CM | POA: Insufficient documentation

## 2017-12-28 DIAGNOSIS — E785 Hyperlipidemia, unspecified: Secondary | ICD-10-CM | POA: Insufficient documentation

## 2017-12-28 DIAGNOSIS — E875 Hyperkalemia: Secondary | ICD-10-CM | POA: Insufficient documentation

## 2017-12-29 ENCOUNTER — Ambulatory Visit (INDEPENDENT_AMBULATORY_CARE_PROVIDER_SITE_OTHER): Payer: Medicare Other | Admitting: Internal Medicine

## 2017-12-29 ENCOUNTER — Encounter: Payer: Self-pay | Admitting: Internal Medicine

## 2017-12-29 VITALS — BP 92/64 | HR 96 | Ht 71.0 in | Wt 245.2 lb

## 2017-12-29 DIAGNOSIS — I480 Paroxysmal atrial fibrillation: Secondary | ICD-10-CM

## 2017-12-29 DIAGNOSIS — I255 Ischemic cardiomyopathy: Secondary | ICD-10-CM

## 2017-12-29 DIAGNOSIS — I5022 Chronic systolic (congestive) heart failure: Secondary | ICD-10-CM

## 2017-12-29 DIAGNOSIS — I4892 Unspecified atrial flutter: Secondary | ICD-10-CM | POA: Diagnosis not present

## 2017-12-29 MED ORDER — AMIODARONE HCL 200 MG PO TABS: ORAL_TABLET | ORAL | 0 refills | Status: AC

## 2017-12-29 MED ORDER — METOPROLOL SUCCINATE ER 25 MG PO TB24: ORAL_TABLET | ORAL | Status: DC

## 2017-12-29 NOTE — Progress Notes (Signed)
Patient Care Team: Baxter Hire, MD as PCP - General (Internal Medicine)   HPI  Maurice Peterson is a 77 y.o. male Seen With Chief Complaint of recurrent atrial fib/flutter and fatigue.  This first became manifest 6/18. Was discharged on amiodarone and warfarin The patient had undergone cardioversion with significant improvement in symptoms. When seen 9/18 he was holding sinus rhythm. On return 10/18 he had reverted to atrial fibrillation/flutter and his antiarrhythmics were discontinued. He has noted significant worsening in his exercise tolerance  Recurrent atrial flutter Rx with amio and because of unstable INR assoc with Vit K given to facilitate HD catheter insertion >> switched to [place]   (Literature review demonstrates a recent paper from [place] comparing warfarin and apixaban and end-stage renal disease patient.  Significant lower bleeding was noted.  No recurrent strokes in either arm.  N was relatively small.  Is also an ongoing randomized trial in [place])   When seen 11/18 with planes of fatigue, possible causes included the carvedilol as opposed to metoprolol, hypotension related to alpha blockade, atrial arrhythmias and hypothyroidism  It was elected to switch him back to metoprolol cardioversion on amiodarone.   He has a history of coronary artery disease with prior inferior wall MI. Echocardiogram 11/17 EF 35% with a left atrial dimension of 3.8 cm  Myoview scan 11/17 EF 46% with normal perfusion (? 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  Urology consult pending   He is struggling to get his strength back; no sob or chest pain or edema--issues above of possible drug related sideeffects became much impossible  to sort out given the intercurrent issues   Past Medical History:  Diagnosis Date  . Arthritis   . Atrial flutter ([place])    a. s/p DCCV 07/2017 briefly successful; b. amio and warfarin; c. CHADS2VASc => 5 (CHF, HTN, age x 2, vascular disease);  [name] CAD (coronary artery disease)    a. remote inferior MI s/p stenting; b. Myoview in 10/2016 with normal perfusion with an EF of 46%  . COPD (chronic obstructive pulmonary disease) ([place])   . ESRD (end stage renal disease) on dialysis ([place])    a. HD MWF  . GERD (gastroesophageal reflux disease)   . Hyperlipidemia   . Hypertension   . Ischemic cardiomyopathy    a. TTE 05/2017: EF 35-40%, unable to exlcude RWMA, mild MR, mildly dilated RV  . Prostate cancer ([place])   . Shortness of breath dyspnea     Past Surgical History:  Procedure Laterality Date  . A/V SHUNT INTERVENTION Left 10/24/2017   Procedure: A/V SHUNT INTERVENTION;  Surgeon: Algernon Huxley, MD;  Location: [facility];  Service: Cardiovascular;  Laterality: Left;  . CARDIOVERSION N/A 07/26/2017   Procedure: Cardioversion;  Surgeon: Corey Skains, MD;  Location: [facility];  Service: Cardiovascular;  Laterality: N/A;  . CARDIOVERSION N/A 08/18/2017   Procedure: CARDIOVERSION;  Surgeon: Corey Skains, MD;  Location: [facility];  Service: Cardiovascular;  Laterality: N/A;  . CARDIOVERSION N/A 10/21/2017   Procedure: CARDIOVERSION;  Surgeon: Wellington Hampshire, MD;  Location: [facility];  Service: Cardiovascular;  Laterality: N/A;  . CARDIOVERSION N/A 11/25/2017   Procedure: CARDIOVERSION;  Surgeon: Wellington Hampshire, MD;  Location: [facility];  Service: Cardiovascular;  Laterality: N/A;  . CATARACT EXTRACTION W/PHACO Right 08/20/2015   Procedure: CATARACT EXTRACTION PHACO AND INTRAOCULAR LENS PLACEMENT ([place]);  Surgeon: Leandrew Koyanagi, MD;  Location: [place];  Service: Ophthalmology;  Laterality: Right;  . CATARACT EXTRACTION W/PHACO Left 09/10/2015   Procedure: CATARACT  EXTRACTION PHACO AND INTRAOCULAR LENS PLACEMENT ([facility]);  Surgeon: Leandrew Koyanagi, MD;  Location: [place];  Service: Ophthalmology;  Laterality: Left;  . CORONARY STENT PLACEMENT    . 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 PERIPHERAL VASCULAR CATHETERIZATION Left 05/20/2015   Procedure: A/V Shuntogram/Fistulagram;  Surgeon: Katha Cabal, MD;  Location: [place] [facility];  Service: Cardiovascular;  Laterality: Left;  . PERIPHERAL VASCULAR CATHETERIZATION N/A 05/20/2015   Procedure: A/V Shunt Intervention;  Surgeon: Katha Cabal, MD;  Location: [facility];  Service: Cardiovascular;  Laterality: N/A;  . PERIPHERAL VASCULAR CATHETERIZATION Left 08/05/2015   Procedure: A/V Shuntogram/Fistulagram;  Surgeon: Katha Cabal, MD;  Location: [facility];  Service: Cardiovascular;  Laterality: Left;  . PERIPHERAL VASCULAR CATHETERIZATION N/A 08/05/2015   Procedure: A/V Shunt Intervention;  Surgeon: Katha Cabal, MD;  Location: [facility];  Service: Cardiovascular;  Laterality: N/A;  . PERIPHERAL VASCULAR CATHETERIZATION Left 01/06/2016   Procedure: A/V Shuntogram/Fistulagram;  Surgeon: Katha Cabal, MD;  Location: [facility];  Service: Cardiovascular;  Laterality: Left;  . PERIPHERAL VASCULAR CATHETERIZATION N/A 01/06/2016   Procedure: A/V Shunt Intervention;  Surgeon: Katha Cabal, MD;  Location: [facility];  Service: Cardiovascular;  Laterality: N/A;  . PERIPHERAL VASCULAR CATHETERIZATION N/A 12/13/2016   Procedure: Dialysis/Perma Catheter Removal;  Surgeon: Algernon Huxley, MD;  Location: [place] [place] [facility];  Service: Cardiovascular;  Laterality: N/A;    Current Outpatient Medications  Medication Sig Dispense Refill  . acetaminophen (TYLENOL) 500 MG tablet Take 1,000 mg by mouth daily as needed for moderate pain or headache.    . albuterol (PROVENTIL HFA;VENTOLIN HFA) 108 (90 BASE) MCG/ACT inhaler Inhale  2 puffs into the lungs every 4 (four) hours as needed for wheezing or shortness of breath.    [name] amiodarone (PACERONE) 200 MG tablet Take 400 mg (2 tab) by mouth twice a day for 1 week  then taken 200 mg (1 tab) by mouth  Twice a day (Patient taking differently: Take 200 mg by mouth 2 (two) times daily. ) 90 tablet 0  . apixaban (ELIQUIS) 5 MG TABS tablet Take 1 tablet (5 mg total) by mouth 2 (two) times daily. 60 tablet 2  . atorvastatin (LIPITOR) 10 MG tablet Take 10 mg by mouth every evening.     [name] azelastine (OPTIVAR) 0.05 % ophthalmic solution Place 1 drop into both eyes 2 (two) times daily as needed (allergies).    . calcium acetate (PHOSLO) 667 MG capsule Take 1,334 mg by mouth 3 (three) times daily with meals.    . Cholecalciferol (VITAMIN D3) 2000 units TABS Take 2,000 Units by mouth daily.     [name] dextromethorphan-guaiFENesin (MUCINEX DM) 30-600 MG 12hr tablet Take 1 tablet by mouth at bedtime as needed for cough.    . DiphenhydrAMINE HCl, Sleep, (ZZZQUIL PO) Take 25 mg by mouth at bedtime as needed (sleep).     . febuxostat (ULORIC) 40 MG tablet Take 40 mg by mouth every evening.     [name]  levothyroxine (SYNTHROID, LEVOTHROID) 25 MCG tablet Take 1 tablet (25 mcg total) by mouth daily before breakfast. 30 tablet 5  . metoprolol succinate (TOPROL-XL) 25 MG 24 hr tablet Take 0.5 tablets (12.5 mg total) by mouth daily. Take with or immediately following a meal. (Patient taking differently: Take 12.5 mg by mouth daily after supper. Take with or immediately following a meal.) 90 tablet 3  . montelukast (SINGULAIR) 10 MG tablet Take 10 mg by mouth every evening.     . multivitamin (RENA-VIT) TABS tablet Take 1 tablet by mouth daily.    . pantoprazole (PROTONIX) 40 MG tablet Take 40 mg by mouth daily before breakfast.     . umeclidinium-vilanterol (ANORO ELLIPTA) 62.5-25 MCG/INH AEPB Inhale 1 puff into the lungs daily.     No current facility-administered medications for this visit.     Facility-Administered Medications Ordered in Other Visits  Medication Dose Route Frequency Provider Last Rate Last Dose  . 0.9 %  sodium chloride infusion    Continuous PRN Johnna Acosta, CRNA        Allergies  Allergen Reactions  . Nsaids Nausea And Vomiting    Anti-inflammatories.  . Amoxicillin-Pot Clavulanate Nausea And Vomiting    Has patient had a PCN reaction causing immediate rash, facial/tongue/throat swelling, SOB or lightheadedness with hypotension: No Has patient had a PCN reaction causing severe rash involving mucus membranes or skin necrosis: No Has patient had a PCN reaction that required hospitalization:Yes-vomiting blood Has patient had a PCN reaction occurring within the last 10 years: Yes If all of the above answers are "NO", then may proceed with Cephalosporin use.       Review of Systems negative except from HPI and PMH  Physical Exam BP 92/64 (BP Location: Left Arm, Patient Position: Sitting, Cuff Size: Normal)   Pulse 96   Ht 5\' 11"  (1.803 m)   Wt 245 lb 4 oz (111.2 kg)   BMI 34.21 kg/m  Well developed and well nourished in no acute distress HENT normal E scleral and icterus clear Neck Supple JVP flat; carotids brisk and full Clear to ausculation Rapid but Regular rate and rhythm, no murmurs gallops or rub Soft with active bowel sounds No clubbing cyanosis  Edema Alert and oriented, grossly normal motor and sensory function Skin Warm and Dry  ECG sinus 94 with 1AVB Compared with ECG 2015 when clearly in sinus and the same pwave morphology was noted    Assessment and Plan:  Atrial fibrillation/flutter persistent  Ischemic cardiomyopathy  Congestive heart failure-chronic-systolic class III  End-stage renal disease on hemodialysis  Hypotension   Hematuria/ blood per urethra   Amiodarone therapy  Hypothyroidism-treated   Euvolemic continue current meds  Holding sinus and so will decrease amio 400>>200 daily.  Will need to  check TSH   Issue of hematuria/urethral bleeding will need to be addressed and urology consult pending   Without symptoms of ischemia  With BP low, will stop

## 2017-12-29 NOTE — Patient Instructions (Signed)
Medication Instructions: - Your physician has recommended you make the following change in your medication:  1) DECREASE amiodarone 200 mg- take 1 tablet by mouth once daily 2) HOLD metoprolol for now  Labwork: - Please take the order given to you today for lab work to the surgeon's office - TSH  Procedures/Testing: - none ordered  Follow-Up: - Your physician wants you to follow-up in: 3 months with Dr. Caryl Comes. You will receive a reminder letter in the mail two months in advance. If you don't receive a letter, please call our office to schedule the follow-up appointment.   Any Additional Special Instructions Will Be Listed Below (If Applicable). - please ask your nephrologist if Proamitine would be a medication they would consider for you to take.     If you need a refill on your cardiac medications before your next appointment, please call your pharmacy.

## 2017-12-30 ENCOUNTER — Encounter: Payer: Self-pay | Admitting: Urology

## 2017-12-30 ENCOUNTER — Ambulatory Visit (INDEPENDENT_AMBULATORY_CARE_PROVIDER_SITE_OTHER): Payer: Medicare Other | Admitting: Urology

## 2017-12-30 DIAGNOSIS — K5720 Diverticulitis of large intestine with perforation and abscess without bleeding: Secondary | ICD-10-CM | POA: Diagnosis not present

## 2017-12-30 DIAGNOSIS — N321 Vesicointestinal fistula: Secondary | ICD-10-CM

## 2017-12-30 NOTE — Progress Notes (Signed)
   01/01/18  CC:  Chief Complaint  Patient presents with  . Cysto    HPI: 77 year old male with end-stage renal disease, history of prostate cancer status post radiation and renal cell carcinoma who was recently admitted in 11/2017 to [facility] with a perforated diverticular abscess requiring percutaneous intervention with aspiration of the anterior pelvic abscess adjacent to the dome of the bladder without evidence of obvious fistula on 12/02/2017.     Although he voids very little, he was having occasional dysuria with brown colored urine.  No history of urinary tract infections.  More recently, he is developed intermittent hematuria and pneumaturia which started 3 days ago.  He is unable to void today.  He returns for office cystoscopy for further evaluation of hematuria as well as to evaluate for possible colovesical fistula.  NED. A&Ox3.   No respiratory distress   Abd soft, NT, ND Normal external genitalia with patent urethral meatus  Cystoscopy Procedure Note  Patient identification was confirmed, informed consent was obtained, and patient was prepped using Betadine solution.  Lidocaine jelly was administered per urethral meatus.    Preoperative abx where received prior to procedure.    Procedure: - Flexible cystoscope introduced, without any difficulty.   - Thorough search of the bladder revealed:    normal urethral meatus    Normal urethra    Hyperemic somewhat irregular prostate    Visualization of the bladder was poor but there was significant amount of air greater than what would be expected for instrumentation Significant amount of bullous edema with raised erythematous areas concerning for either possible tumor or fistulous area although visualization was suboptimal given the amount of debris  Post-Procedure: - Patient tolerated the procedure well  Assessment/ Plan:  1.  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 Hampton Abbot, general surgery on Monday.  I attempted to reach out to his office today but he will be out of town until Monday. Discussed with the patient they will likely need repeat imaging as well as probable eventual surgical exploration/resection/biopsy but the plan and timing of this are TBD based on repeat imaging and discussion with general surgery. Warning symptoms were discussed with the patient including fevers, chills, rigors, abdominal pain-if he experiences any of the symptoms, he was advised to present to the emergency room immediately  Hollice Espy, MD

## 2018-01-01 ENCOUNTER — Encounter: Payer: Self-pay | Admitting: Urology

## 2018-01-02 ENCOUNTER — Encounter: Payer: Self-pay | Admitting: Surgery

## 2018-01-02 ENCOUNTER — Ambulatory Visit (INDEPENDENT_AMBULATORY_CARE_PROVIDER_SITE_OTHER): Payer: Medicare Other | Admitting: Surgery

## 2018-01-02 ENCOUNTER — Other Ambulatory Visit (INDEPENDENT_AMBULATORY_CARE_PROVIDER_SITE_OTHER): Payer: Self-pay | Admitting: Vascular Surgery

## 2018-01-02 ENCOUNTER — Ambulatory Visit: Payer: TRICARE For Life (TFL) | Admitting: Surgery

## 2018-01-02 ENCOUNTER — Encounter
Admission: RE | Disposition: A | Discharge status: Home / Self Care | Payer: Self-pay | Source: Ambulatory Visit | Attending: Vascular Surgery

## 2018-01-02 ENCOUNTER — Ambulatory Visit
Admission: RE | Admit: 2018-01-02 | Discharge: 2018-01-02 | Disposition: A | Discharge status: Home / Self Care | Payer: Medicare Other | Source: Ambulatory Visit | Attending: Vascular Surgery | Admitting: Vascular Surgery

## 2018-01-02 VITALS — BP 112/74 | HR 99 | Temp 98.2°F | Ht 71.0 in | Wt 250.2 lb

## 2018-01-02 DIAGNOSIS — Z9889 Other specified postprocedural states: Secondary | ICD-10-CM | POA: Insufficient documentation

## 2018-01-02 DIAGNOSIS — Z9842 Cataract extraction status, left eye: Secondary | ICD-10-CM | POA: Diagnosis not present

## 2018-01-02 DIAGNOSIS — I2510 Atherosclerotic heart disease of native coronary artery without angina pectoris: Secondary | ICD-10-CM | POA: Diagnosis not present

## 2018-01-02 DIAGNOSIS — Z87891 Personal history of nicotine dependence: Secondary | ICD-10-CM | POA: Insufficient documentation

## 2018-01-02 DIAGNOSIS — Z8249 Family history of ischemic heart disease and other diseases of the circulatory system: Secondary | ICD-10-CM | POA: Diagnosis not present

## 2018-01-02 DIAGNOSIS — I132 Hypertensive heart and chronic kidney disease with heart failure and with stage 5 chronic kidney disease, or end stage renal disease: Secondary | ICD-10-CM | POA: Diagnosis not present

## 2018-01-02 DIAGNOSIS — I509 Heart failure, unspecified: Secondary | ICD-10-CM | POA: Insufficient documentation

## 2018-01-02 DIAGNOSIS — Z800 Family history of malignant neoplasm of digestive organs: Secondary | ICD-10-CM | POA: Insufficient documentation

## 2018-01-02 DIAGNOSIS — Z886 Allergy status to analgesic agent status: Secondary | ICD-10-CM | POA: Diagnosis not present

## 2018-01-02 DIAGNOSIS — Z955 Presence of coronary angioplasty implant and graft: Secondary | ICD-10-CM | POA: Insufficient documentation

## 2018-01-02 DIAGNOSIS — Z9841 Cataract extraction status, right eye: Secondary | ICD-10-CM | POA: Insufficient documentation

## 2018-01-02 DIAGNOSIS — Z8546 Personal history of malignant neoplasm of prostate: Secondary | ICD-10-CM | POA: Insufficient documentation

## 2018-01-02 DIAGNOSIS — N186 End stage renal disease: Secondary | ICD-10-CM

## 2018-01-02 DIAGNOSIS — K219 Gastro-esophageal reflux disease without esophagitis: Secondary | ICD-10-CM | POA: Insufficient documentation

## 2018-01-02 DIAGNOSIS — I255 Ischemic cardiomyopathy: Secondary | ICD-10-CM | POA: Diagnosis not present

## 2018-01-02 DIAGNOSIS — Z8379 Family history of other diseases of the digestive system: Secondary | ICD-10-CM | POA: Diagnosis not present

## 2018-01-02 DIAGNOSIS — Z992 Dependence on renal dialysis: Secondary | ICD-10-CM | POA: Diagnosis not present

## 2018-01-02 DIAGNOSIS — E785 Hyperlipidemia, unspecified: Secondary | ICD-10-CM | POA: Diagnosis not present

## 2018-01-02 DIAGNOSIS — N321 Vesicointestinal fistula: Secondary | ICD-10-CM | POA: Diagnosis not present

## 2018-01-02 DIAGNOSIS — I4892 Unspecified atrial flutter: Secondary | ICD-10-CM | POA: Insufficient documentation

## 2018-01-02 DIAGNOSIS — K5792 Diverticulitis of intestine, part unspecified, without perforation or abscess without bleeding: Secondary | ICD-10-CM | POA: Diagnosis not present

## 2018-01-02 DIAGNOSIS — Y832 Surgical operation with anastomosis, bypass or graft as the cause of abnormal reaction of the patient, or of later complication, without mention of misadventure at the time of the procedure: Secondary | ICD-10-CM | POA: Diagnosis not present

## 2018-01-02 DIAGNOSIS — I10 Essential (primary) hypertension: Secondary | ICD-10-CM | POA: Diagnosis not present

## 2018-01-02 DIAGNOSIS — T82868A Thrombosis of vascular prosthetic devices, implants and grafts, initial encounter: Secondary | ICD-10-CM | POA: Diagnosis not present

## 2018-01-02 DIAGNOSIS — Z880 Allergy status to penicillin: Secondary | ICD-10-CM | POA: Diagnosis not present

## 2018-01-02 DIAGNOSIS — I252 Old myocardial infarction: Secondary | ICD-10-CM | POA: Insufficient documentation

## 2018-01-02 DIAGNOSIS — J449 Chronic obstructive pulmonary disease, unspecified: Secondary | ICD-10-CM | POA: Diagnosis not present

## 2018-01-02 HISTORY — PX: PERIPHERAL VASCULAR THROMBECTOMY: CATH118306

## 2018-01-02 LAB — POTASSIUM (ARMC VASCULAR LAB ONLY): Potassium (ARMC vascular lab): 7.1 (ref 3.5–5.1)

## 2018-01-02 LAB — TSH: TSH: 14.139 u[IU]/mL — ABNORMAL HIGH (ref 0.350–4.500)

## 2018-01-02 LAB — POTASSIUM: POTASSIUM: 5.8 mmol/L — AB (ref 3.5–5.1)

## 2018-01-02 SURGERY — PERIPHERAL VASCULAR THROMBECTOMY
Anesthesia: Moderate Sedation | Laterality: Left

## 2018-01-02 MED ORDER — CLINDAMYCIN PHOSPHATE 300 MG/50ML IV SOLN
300.0000 mg | Freq: Once | INTRAVENOUS | Status: AC
  Administered 2018-01-02: 300 mg via INTRAVENOUS

## 2018-01-02 MED ORDER — FENTANYL CITRATE (PF) 100 MCG/2ML IJ SOLN
INTRAMUSCULAR | Status: DC | PRN
  Administered 2018-01-02: 25 ug via INTRAVENOUS
  Administered 2018-01-02: 50 ug via INTRAVENOUS
  Administered 2018-01-02: 25 ug via INTRAVENOUS
  Administered 2018-01-02: 50 ug via INTRAVENOUS

## 2018-01-02 MED ORDER — ALTEPLASE 2 MG IJ SOLR
INTRAMUSCULAR | Status: AC
  Filled 2018-01-02: qty 4

## 2018-01-02 MED ORDER — MIDAZOLAM HCL 5 MG/5ML IJ SOLN
INTRAMUSCULAR | Status: AC
  Filled 2018-01-02: qty 5

## 2018-01-02 MED ORDER — IOPAMIDOL (ISOVUE-300) INJECTION 61%
INTRAVENOUS | Status: DC | PRN
  Administered 2018-01-02: 40 mL via INTRAVENOUS

## 2018-01-02 MED ORDER — SODIUM CHLORIDE 0.9 % IV SOLN: INTRAVENOUS | Status: DC

## 2018-01-02 MED ORDER — HEPARIN SODIUM (PORCINE) 1000 UNIT/ML IJ SOLN
INTRAMUSCULAR | Status: DC | PRN
  Administered 2018-01-02: 4000 [IU] via INTRAVENOUS

## 2018-01-02 MED ORDER — FENTANYL CITRATE (PF) 100 MCG/2ML IJ SOLN
INTRAMUSCULAR | Status: AC
  Filled 2018-01-02: qty 2

## 2018-01-02 MED ORDER — ONDANSETRON HCL 4 MG/2ML IJ SOLN: 4.0000 mg | Freq: Four times a day (QID) | INTRAMUSCULAR | Status: DC | PRN

## 2018-01-02 MED ORDER — LIDOCAINE-EPINEPHRINE (PF) 1 %-1:200000 IJ SOLN
INTRAMUSCULAR | Status: AC
  Filled 2018-01-02: qty 30

## 2018-01-02 MED ORDER — HEPARIN SODIUM (PORCINE) 1000 UNIT/ML IJ SOLN
INTRAMUSCULAR | Status: AC
  Filled 2018-01-02: qty 1

## 2018-01-02 MED ORDER — HEPARIN (PORCINE) IN NACL 2-0.9 UNIT/ML-% IJ SOLN
INTRAMUSCULAR | Status: AC
  Filled 2018-01-02: qty 1000

## 2018-01-02 MED ORDER — FAMOTIDINE 20 MG PO TABS: 40.0000 mg | ORAL_TABLET | ORAL | Status: DC | PRN

## 2018-01-02 MED ORDER — MIDAZOLAM HCL 2 MG/2ML IJ SOLN
INTRAMUSCULAR | Status: DC | PRN
  Administered 2018-01-02: 2 mg via INTRAVENOUS
  Administered 2018-01-02 (×3): 1 mg via INTRAVENOUS

## 2018-01-02 MED ORDER — CLINDAMYCIN PHOSPHATE 300 MG/50ML IV SOLN
INTRAVENOUS | Status: AC
  Filled 2018-01-02: qty 50

## 2018-01-02 MED ORDER — METHYLPREDNISOLONE SODIUM SUCC 125 MG IJ SOLR: 125.0000 mg | INTRAMUSCULAR | Status: DC | PRN

## 2018-01-02 MED ORDER — HYDROMORPHONE HCL 1 MG/ML IJ SOLN: 1.0000 mg | Freq: Once | INTRAMUSCULAR | Status: DC | PRN

## 2018-01-02 MED ORDER — LIDOCAINE HCL 1 % IJ SOLN
INTRAMUSCULAR | Status: DC | PRN
  Administered 2018-01-02: 10 mL

## 2018-01-02 MED ORDER — ALTEPLASE 2 MG IJ SOLR
INTRAMUSCULAR | Status: DC | PRN
  Administered 2018-01-02: 4 mg

## 2018-01-02 SURGICAL SUPPLY — 14 items
BALLN DORADO7X100X80 (BALLOONS) ×3
BALLOON DORADO7X100X80 (BALLOONS) ×1 IMPLANT
CANNULA 5F STIFF (CANNULA) ×6 IMPLANT
CATH BEACON 5 .035 40 KMP TP (CATHETERS) ×1 IMPLANT
CATH BEACON 5 .038 40 KMP TP (CATHETERS) ×2
CATH EMBOLECTOMY 5FR (BALLOONS) ×3 IMPLANT
DEVICE PRESTO INFLATION (MISCELLANEOUS) ×3 IMPLANT
DRAPE BRACHIAL (DRAPES) ×3 IMPLANT
PACK ANGIOGRAPHY (CUSTOM PROCEDURE TRAY) ×3 IMPLANT
SET AVX THROMB ULT (MISCELLANEOUS) ×3 IMPLANT
SHEATH BRITE TIP 6FRX5.5 (SHEATH) ×6 IMPLANT
SUT MNCRL AB 4-0 PS2 18 (SUTURE) ×6 IMPLANT
TOWEL OR 17X26 4PK STRL BLUE (TOWEL DISPOSABLE) ×3 IMPLANT
WIRE MAGIC TOR.035 180C (WIRE) ×6 IMPLANT

## 2018-01-02 NOTE — H&P (Signed)
[facility] Admission History & Physical  MRN : [id number]  Maurice Peterson is a 77 y.o. (10/31/1941) male who presents with chief complaint of No chief complaint on file. [name]  History of Present Illness: I am asked to evaluate the patient by the dialysis center. The patient was sent here because they were unable to cannulate the access this morning. Furthermore the Center states there is no thrill or bruit. The patient states this is the first dialysis run to be missed. This problem is acute in onset and has been present for approximately 2 days. The patient is unaware of any other change.  Patient denies pain or tenderness overlying the access.  There is no pain with dialysis.  The patient denies hand pain or finger pain consistent with steal syndrome.   There have been many past interventions or declots of this access.  The patient is not chronically hypotensive on dialysis.  Current Facility-Administered Medications  Medication Dose Route Frequency Provider Last Rate Last Dose  . 0.9 %  sodium chloride infusion   Intravenous Continuous Stegmayer, Kimberly A, PA-C      . clindamycin (CLEOCIN) IVPB 300 mg  300 mg Intravenous Once Stegmayer, Kimberly A, PA-C      . famotidine (PEPCID) tablet 40 mg  40 mg Oral PRN Stegmayer, Kimberly A, PA-C      . fentaNYL (SUBLIMAZE) injection    PRN Algernon Huxley, MD   50 mcg at 01/02/18 1525  . HYDROmorphone (DILAUDID) injection 1 mg  1 mg Intravenous Once PRN Stegmayer, Kimberly A, PA-C      . methylPREDNISolone sodium succinate (SOLU-MEDROL) 125 mg/2 mL injection 125 mg  125 mg Intravenous PRN Stegmayer, Kimberly A, PA-C      . midazolam (VERSED) injection    PRN Algernon Huxley, MD   2 mg at 01/02/18 1525  . ondansetron (ZOFRAN) injection 4 mg  4 mg Intravenous Q6H PRN Stegmayer, Janalyn Harder, PA-C       Facility-Administered Medications Ordered in Other Encounters  Medication Dose Route Frequency Provider Last Rate Last Dose  . 0.9 %   sodium chloride infusion    Continuous PRN Johnna Acosta, CRNA        Past Medical History:  Diagnosis Date  . Arthritis   . Atrial flutter ([place])    a. s/p DCCV 07/2017 briefly successful; b. amio and warfarin; c. CHADS2VASc => 5 (CHF, HTN, age x 2, vascular disease);  [name] CAD (coronary artery disease)    a. remote inferior MI s/p stenting; b. Myoview in 10/2016 with normal perfusion with an EF of 46%  . COPD (chronic obstructive pulmonary disease) ([place])   . ESRD (end stage renal disease) on dialysis ([place])    a. HD MWF  . GERD (gastroesophageal reflux disease)   . Hyperlipidemia   . Hypertension   . Ischemic cardiomyopathy    a. TTE 05/2017: EF 35-40%, unable to exlcude RWMA, mild MR, mildly dilated RV  . Prostate cancer ([place])   . Shortness of breath dyspnea     Past Surgical History:  Procedure Laterality Date  . A/V SHUNT INTERVENTION Left 10/24/2017   Procedure: A/V SHUNT INTERVENTION;  Surgeon: Algernon Huxley, MD;  Location: [facility];  Service: Cardiovascular;  Laterality: Left;  . CARDIOVERSION N/A 07/26/2017   Procedure: Cardioversion;  Surgeon: Corey Skains, MD;  Location: [facility];  Service: Cardiovascular;  Laterality: N/A;  . CARDIOVERSION N/A 08/18/2017   Procedure: CARDIOVERSION;  Surgeon: Corey Skains, MD;  Location: [facility];  Service: Cardiovascular;  Laterality: N/A;  . CARDIOVERSION N/A 10/21/2017   Procedure: CARDIOVERSION;  Surgeon: Wellington Hampshire, MD;  Location: [facility];  Service: Cardiovascular;  Laterality: N/A;  . CARDIOVERSION N/A 11/25/2017   Procedure: CARDIOVERSION;  Surgeon: Wellington Hampshire, MD;  Location: [facility];  Service: Cardiovascular;  Laterality: N/A;  . CATARACT EXTRACTION W/PHACO Right 08/20/2015   Procedure: CATARACT EXTRACTION PHACO AND INTRAOCULAR LENS PLACEMENT ([place]);  Surgeon: Leandrew Koyanagi, MD;  Location: [place] [facility];  Service: Ophthalmology;  Laterality: Right;  . CATARACT EXTRACTION W/PHACO Left  09/10/2015   Procedure: CATARACT EXTRACTION PHACO AND INTRAOCULAR LENS PLACEMENT ([facility]);  Surgeon: Leandrew Koyanagi, MD;  Location: [facility];  Service: Ophthalmology;  Laterality: Left;  . CORONARY STENT PLACEMENT    . 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 PERIPHERAL VASCULAR CATHETERIZATION Left 05/20/2015   Procedure: A/V Shuntogram/Fistulagram;  Surgeon: Katha Cabal, MD;  Location: [facility];  Service: Cardiovascular;  Laterality: Left;  . PERIPHERAL VASCULAR CATHETERIZATION N/A 05/20/2015   Procedure: A/V Shunt Intervention;  Surgeon: Katha Cabal, MD;  Location: [facility];  Service: Cardiovascular;  Laterality: N/A;  . PERIPHERAL VASCULAR CATHETERIZATION Left 08/05/2015   Procedure: A/V Shuntogram/Fistulagram;  Surgeon: Katha Cabal, MD;  Location: [facility];  Service: Cardiovascular;  Laterality: Left;  . PERIPHERAL VASCULAR CATHETERIZATION N/A 08/05/2015   Procedure: A/V Shunt Intervention;  Surgeon: Katha Cabal, MD;  Location: [facility];  Service: Cardiovascular;  Laterality: N/A;  . PERIPHERAL VASCULAR CATHETERIZATION Left 01/06/2016   Procedure: A/V Shuntogram/Fistulagram;  Surgeon: Katha Cabal, MD;  Location: [facility];  Service: Cardiovascular;  Laterality: Left;  . PERIPHERAL VASCULAR CATHETERIZATION N/A 01/06/2016   Procedure: A/V Shunt Intervention;  Surgeon: Katha Cabal, MD;  Location: [facility];  Service: Cardiovascular;  Laterality: N/A;  . PERIPHERAL VASCULAR CATHETERIZATION N/A 12/13/2016   Procedure: Dialysis/Perma Catheter Removal;  Surgeon: Algernon Huxley, MD;  Location: [facility];  Service: Cardiovascular;  Laterality: N/A;    Social History Social History   Tobacco Use  . Smoking status: Former Smoker    Packs/day: 1.00    Years: 30.00    Pack years: 30.00    Types: Cigarettes    Last attempt to quit: 05/19/2009    Years since quitting:  8.6  . Smokeless tobacco: Never Used  Substance Use Topics  . Alcohol use: No  . Drug use: No    Family History Family History  Problem Relation Age of Onset  . Liver disease Mother   . Heart disease Mother   . Hypertension Mother   . Pancreatic cancer Father   . Kidney cancer Neg Hx   . Kidney disease Neg Hx   . Prostate cancer Neg Hx     No family history of bleeding or clotting disorders, autoimmune disease or porphyria  Allergies  Allergen Reactions  . Nsaids Nausea And Vomiting    Anti-inflammatories.  . Amoxicillin-Pot Clavulanate Nausea And Vomiting    Has patient had a PCN reaction causing immediate rash, facial/tongue/throat swelling, SOB or lightheadedness with hypotension: No Has patient had a PCN reaction causing severe rash involving mucus membranes or skin necrosis: No Has patient had a PCN reaction that required hospitalization:Yes-vomiting blood Has patient had a PCN reaction occurring within the last 10 years: Yes If all of the above answers  are "NO", then may proceed with Cephalosporin use.      REVIEW OF SYSTEMS (Negative unless checked)  Constitutional: [] Weight loss  [] Fever  [] Chills Cardiac: [] Chest pain   [] Chest pressure   [x] Palpitations   [] Shortness of breath when laying flat   [] Shortness of breath at rest   [x] Shortness of breath with exertion. Vascular:  [] Pain in legs with walking   [] Pain in legs at rest   [] Pain in legs when laying flat   [] Claudication   [] Pain in feet when walking  [] Pain in feet at rest  [] Pain in feet when laying flat   [] History of DVT   [] Phlebitis   [] Swelling in legs   [] Varicose veins   [] Non-healing ulcers Pulmonary:   [] Uses home oxygen   [] Productive cough   [] Hemoptysis   [] Wheeze  [] COPD   [] Asthma Neurologic:  [] Dizziness  [] Blackouts   [] Seizures   [] History of stroke   [] History of TIA  [] Aphasia   [] Temporary blindness   [] Dysphagia   [] Weakness or numbness in arms   [] Weakness or numbness in  legs Musculoskeletal:  [x] Arthritis   [] Joint swelling   [] Joint pain   [] Low back pain Hematologic:  [] Easy bruising  [] Easy bleeding   [] Hypercoagulable state   [x] Anemic  [] Hepatitis Gastrointestinal:  [] Blood in stool   [] Vomiting blood  [] Gastroesophageal reflux/heartburn   [] Difficulty swallowing. Genitourinary:  [x] Chronic kidney disease   [] Difficult urination  [] Frequent urination  [] Burning with urination   [] Blood in urine Skin:  [] Rashes   [] Ulcers   [] Wounds Psychological:  [] History of anxiety   []  History of major depression.  Physical Examination  Vitals:   01/02/18 1529  SpO2: 96%   There is no height or weight on file to calculate BMI. Gen: WD/WN, NAD Head: [place]/AT, No temporalis wasting. Ear/Nose/Throat: Hearing grossly intact, nares w/o erythema or drainage, oropharynx w/o Erythema/Exudate,  Eyes: Conjunctiva clear, sclera non-icteric Neck: Trachea midline.  No JVD.  Pulmonary:  Good air movement, respirations not labored, no use of accessory muscles.  Cardiac: irregular Vascular: not thrill in left arm AVF Vessel Right Left  Radial Palpable Palpable               Musculoskeletal: M/S 5/5 throughout.  Extremities without ischemic changes.  No deformity or atrophy.  Neurologic: Sensation grossly intact in extremities.  Symmetrical.  Speech is fluent. Motor exam as listed above. Psychiatric: Judgment intact, Mood & affect appropriate for pt's clinical situation. Dermatologic: No rashes or ulcers noted.  No cellulitis or open wounds.    CBC Lab Results  Component Value Date   WBC 9.9 12/18/2017   HGB 11.5 (L) 12/18/2017   HCT 35.8 (L) 12/18/2017   MCV 93.4 12/18/2017   PLT 203 12/18/2017    BMET    Component Value Date/Time   NA 137 12/18/2017 0622   NA 144 11/22/2017 1045   NA 141 12/17/2014 1034   K 5.8 (H) 01/02/2018 1410   K 4.5 12/17/2014 1034   CL 95 (L) 12/18/2017 0622   CL 108 (H) 12/17/2014 1034   CO2 28 12/18/2017 0622   CO2 26  12/17/2014 1034   GLUCOSE 89 12/18/2017 0622   GLUCOSE 98 12/17/2014 1034   BUN 37 (H) 12/18/2017 0622   BUN 34 (H) 11/22/2017 1045   BUN 45 (H) 12/17/2014 1034   CREATININE 7.81 (H) 12/18/2017 0622   CREATININE 4.30 (H) 12/17/2014 1034   CALCIUM 8.9 12/18/2017 0622   CALCIUM 7.7 (L) 12/17/2014 1034  GFRNONAA 6 (L) 12/18/2017 0622   GFRNONAA 15 (L) 12/17/2014 1034   GFRNONAA 15 (L) 07/25/2014 1040   GFRAA 7 (L) 12/18/2017 0622   GFRAA 18 (L) 12/17/2014 1034   GFRAA 17 (L) 07/25/2014 1040   Estimated Creatinine Clearance: 10.3 mL/min (A) (by C-G formula based on SCr of 7.81 mg/dL (H)).  COAG Lab Results  Component Value Date   INR 1.3 (H) 11/22/2017   INR 1.44 10/26/2017   INR 1.48 10/25/2017    Radiology No results found.  Assessment/Plan 1.  Complication dialysis device with thrombosis AV access:  Patient's left arm AVF dialysis access is thrombosed. The patient will undergo thrombectomy using interventional techniques.  The risks and benefits were described to the patient.  All questions were answered.  The patient agrees to proceed with angiography and intervention. Potassium will be drawn to ensure that it is an appropriate level prior to performing thrombectomy. 2.  End-stage renal disease requiring hemodialysis:  Patient will continue dialysis therapy without further interruption if a successful thrombectomy is not achieved then catheter will be placed. Dialysis has already been arranged since the patient missed their previous session 3.  Hypertension:  Patient will continue medical management; nephrology is following no changes in oral medications. 4.  Coronary artery disease:  EKG will be monitored. Nitrates will be used if needed. The patient's oral cardiac medications will be continued.    Leotis Pain, MD  01/02/2018 3:31 PM

## 2018-01-02 NOTE — Op Note (Signed)
[facility]    OPERATIVE NOTE   PROCEDURE: 1.  Left radiocephalic arteriovenous fistula cannulation under ultrasound guidance in both a retrograde and then antegrade fashion crossing 2.  Left arm fistulagram and central venogram 3.  Catheter directed thrombolysis with 4 mg of TPA delivered with the AngioJet AVX catheter 4.  Mechanical rheolytic thrombectomy to the left radiocephalic AV fistula with the AngioJet AVX catheter 5.  Fogarty embolectomy for residual arterial plug 6.  Percutaneous transluminal angioplasty of forearm cephalic vein from about 2 cm beyond the anastomosis to the antecubital fossa with multiple inflations with a 68m x 10 cm length high-pressure angioplasty  PRE-OPERATIVE DIAGNOSIS: 1. ESRD 2.  Thrombosed left radiocephalic arteriovenous fistula  POST-OPERATIVE DIAGNOSIS: same as above   SURGEON: JLeotis Pain MD  ANESTHESIA: local with Moderate Conscious Sedation for 45 minutes using 5 mg of Versed and 150 Mcg of Fentanyl  ESTIMATED BLOOD LOSS: 10 cc  FINDING(S): 1. Thrombosed AV fistula and forearm stents requiring extensive intervention to salvage.  The upper arm drainage was largely through the basilic vein with occlusion of the cephalic vein in the upper arm.  The central venous circulation appeared to be patent.  SPECIMEN(S):  None  CONTRAST: 40 cc  FLUORO TIME: 4.1 minutes  INDICATIONS: Patient is a 77y.o.male who presents with a thrombosed left radiocephalic arteriovenous fistula.  The patient is scheduled for an attempted declot and shuntogram.  The patient is aware the risks include but are not limited to: bleeding, infection, thrombosis of the cannulated access, and possible anaphylactic reaction to the contrast.  The patient is aware of the risks of the procedure and elects to proceed forward.  DESCRIPTION: After full informed written consent was obtained, the patient was brought back to the angiography suite and placed  supine upon the angiography table.  The patient was connected to monitoring equipment. Moderate conscious sedation was administered during a face to face encounter with the patient throughout the procedure with my supervision of the RN administering medicines and monitoring the patient's vital signs, pulse oximetry, telemetry and mental status throughout from the start of the procedure until the patient was taken to the recovery room. The left arm was prepped and draped in the standard fashion for a percutaneous access intervention.  Under ultrasound guidance, the left radiocephalic arteriovenous fistula was cannulated with a micropuncture needle under direct ultrasound guidance due to the pulseless nature of the fistula in both an antegrade and a retrograde fashion crossing, and permanent images were performed.  The microwire was advanced and the needle was exchanged for the a microsheath.  I then upsized to a 6 Fr Sheath and imaging was performed.  Hand injections were completed to image the access including the central venous system. This demonstrated thrombosis of the AVF.  Based on the images, this patient will need extensive treatment to salvage the graft. I then gave the patient 4000 units of intravenous heparin.  I then placed a Magic torque wire into the brachial artery from the retrograde sheath and into the upper arm cephalic vein from the antegrade sheath. 4 mg of TPA were deployed throughout the entirety of the fistula and into the cephalic vein up to the antecubital fossa with the thrombosis ended. This was allowed to dwell for 5 minutes. Mechanical rheolytic thrombectomy was then performed throughout the fistula and into the cephalic vein. This uncovered multiple areas within the stent of stenosis and residual thrombus.  A residual arterial plug was also seen  at the arterial anastomosis. An attempt to clear the arterial plug was done with 2 passes of the Fogarty embolectomy balloon. This resulted in  resolution of the arterial plug, although there was still narrowing and thrombus near the access site for the antegrade sheath about 3 cm beyond the anastomosis. I then turned my attention to the thrombus in the fistula and the cephalic vein. Mechanical rheolytic thrombectomy was performed again. This resulted in minimal improvement in the narrowing within the stents.  I then elected to treat this with multiple inflations with the 7 mm diameter by 10 cm length high-pressure angioplasty balloon.  To treat the area only 2-3 cm beyond the anastomosis and initial parts of the fistula, and this was put through the retrograde sheath and inflated to 12 atm for 1 minute.  To treat the proximal and mid upper arm cephalic vein for in-stent stenosis as well as stenosis just beyond the previously placed stents, it was placed through the antegrade sheath and inflated to 14-16 atm for 1 minute.  Completion imaging demonstrated this access to now be widely patent although some deformation of the remaining stents in the forearm remained in some areas in the 30-40% residual stenosis within and just beyond the previously placed stents remained.  I will discussed with the patient consideration for surgical revision for long-term option, but this fistula can be used for the time being for his dialysis. The wire and balloon were removed from the sheath.  A 4-0 Monocryl purse-string suture was sewn around each sheath.  The sheaths were removed while tying down the suture.  A sterile bandage was applied to the puncture site.  COMPLICATIONS: None  CONDITION: Stable   Maurice Peterson 01/02/2018 4:45 PM   This note was created with Dragon Medical transcription system. Any errors in dictation are purely unintentional.

## 2018-01-02 NOTE — Progress Notes (Signed)
01/02/2018  History of Present Illness: Maurice Peterson is a 77 y.o. male who presents as a follow-up for acute diverticulitis with abscess.  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  He was admitted to the hospital that same day and underwent a percutaneous aspiration of the abscess on 12/7.  He was discharged to home on 12/8 with a course of oral antibiotics.  He finished the antibiotics and then reports that on 12/18/17 he presented to the emergency room due to hematuria with some blood and clots in the urine.  He was diagnosed with a urinary tract infection and given a course of antibiotic for home as well.  He was given a referral to see Dr. Erlene Quan with urology.  He was seen by her on 12/30/17 and had a cystoscopy done which showed a significant amount of air greater than what would be expected for instrumentation as well as significant amount of bullous edema with raised erythematous areas concerning for either possible tumor or fistulous area.  During the visit with Dr. Erlene Quan he had reported also that he was having pneumaturia as well.  He reports that after the cystoscopy was done he did have some mild hematuria but otherwise has not had any issues over the last few days since the procedure.  He now presents to [place] for further evaluation.  He denies having any abdominal pain, nausea, vomiting, fevers, chills.  Of note he is currently scheduled to see Dr. Lucky Cowboy this afternoon because of a malfunctioning left upper extremity AV fistula.   Past Medical History: Past Medical History:  Diagnosis Date  . Arthritis   . Atrial flutter ([place])    a. s/p DCCV 07/2017 briefly successful; b. amio and warfarin; c. CHADS2VASc => 5 (CHF, HTN, age x 2, vascular disease);  [name] CAD (coronary artery disease)    a. remote inferior MI s/p stenting; b. Myoview in 10/2016 with normal perfusion  with an EF of 46%  . COPD (chronic obstructive pulmonary disease) ([place])   . ESRD (end stage renal disease) on dialysis ([place])    a. HD MWF  . GERD (gastroesophageal reflux disease)   . Hyperlipidemia   . Hypertension   . Ischemic cardiomyopathy    a. TTE 05/2017: EF 35-40%, unable to exlcude RWMA, mild MR, mildly dilated RV  . Prostate cancer ([place])   . Shortness of breath dyspnea      Past Surgical History: Past Surgical History:  Procedure Laterality Date  . A/V SHUNT INTERVENTION Left 10/24/2017   Procedure: A/V SHUNT INTERVENTION;  Surgeon: Algernon Huxley, MD;  Location: [place] [facility];  Service: Cardiovascular;  Laterality: Left;  . CARDIOVERSION N/A 07/26/2017   Procedure: Cardioversion;  Surgeon: Corey Skains, MD;  Location: [facility];  Service: Cardiovascular;  Laterality: N/A;  . CARDIOVERSION N/A 08/18/2017   Procedure: CARDIOVERSION;  Surgeon: Corey Skains, MD;  Location: [facility];  Service: Cardiovascular;  Laterality: N/A;  . CARDIOVERSION N/A 10/21/2017   Procedure: CARDIOVERSION;  Surgeon: Wellington Hampshire, MD;  Location: [facility];  Service: Cardiovascular;  Laterality: N/A;  . CARDIOVERSION N/A 11/25/2017   Procedure: CARDIOVERSION;  Surgeon: Wellington Hampshire, MD;  Location: [facility];  Service: Cardiovascular;  Laterality: N/A;  . CATARACT EXTRACTION W/PHACO Right 08/20/2015   Procedure: CATARACT EXTRACTION PHACO AND INTRAOCULAR LENS PLACEMENT ([place]);  Surgeon: Leandrew Koyanagi, MD;  Location: [place];  Service: Ophthalmology;  Laterality: Right;  . CATARACT EXTRACTION W/PHACO Left 09/10/2015   Procedure: CATARACT EXTRACTION PHACO AND INTRAOCULAR LENS PLACEMENT ([facility]);  Surgeon: Leandrew Koyanagi, MD;  Location: [place];  Service: Ophthalmology;  Laterality: Left;  . CORONARY STENT PLACEMENT    . 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 PERIPHERAL VASCULAR CATHETERIZATION Left 05/20/2015   Procedure: A/V  Shuntogram/Fistulagram;  Surgeon: Katha Cabal, MD;  Location: [place] [facility];  Service: Cardiovascular;  Laterality: Left;  . PERIPHERAL VASCULAR CATHETERIZATION N/A 05/20/2015   Procedure: A/V Shunt Intervention;  Surgeon: Katha Cabal, MD;  Location: [place] [facility];  Service: Cardiovascular;  Laterality: N/A;  . PERIPHERAL VASCULAR CATHETERIZATION Left 08/05/2015   Procedure: A/V Shuntogram/Fistulagram;  Surgeon: Katha Cabal, MD;  Location: [place] [facility];  Service: Cardiovascular;  Laterality: Left;  . PERIPHERAL VASCULAR CATHETERIZATION N/A 08/05/2015   Procedure: A/V Shunt Intervention;  Surgeon: Katha Cabal, MD;  Location: [place] [facility];  Service: Cardiovascular;  Laterality: N/A;  . PERIPHERAL VASCULAR CATHETERIZATION Left 01/06/2016   Procedure: A/V Shuntogram/Fistulagram;  Surgeon: Katha Cabal, MD;  Location: [place] [facility];  Service: Cardiovascular;  Laterality: Left;  . PERIPHERAL VASCULAR CATHETERIZATION N/A 01/06/2016   Procedure: A/V Shunt Intervention;  Surgeon: Katha Cabal, MD;  Location: [place] [facility];  Service: Cardiovascular;  Laterality: N/A;  . PERIPHERAL VASCULAR CATHETERIZATION N/A 12/13/2016   Procedure: Dialysis/Perma Catheter Removal;  Surgeon: Algernon Huxley, MD;  Location: [place] [facility];  Service: Cardiovascular;  Laterality: N/A;    Home Medications: Prior to Admission medications   Medication Sig Start Date End Date Taking? Authorizing Provider  acetaminophen (TYLENOL) 500 MG tablet Take 1,000 mg by mouth daily as needed for moderate pain or headache.   Yes [provider]  albuterol (PROVENTIL HFA;VENTOLIN HFA) 108 (90 BASE) MCG/ACT inhaler Inhale 2 puffs into the lungs every 4 (four) hours as needed for wheezing or shortness of breath.   Yes [provider]  amiodarone (PACERONE) 200 MG tablet Take 1 tablet (200 mg) by mouth once daily 12/29/17  Yes Deboraha Sprang, MD  apixaban  (ELIQUIS) 5 MG TABS tablet Take 1 tablet (5 mg total) by mouth 2 (two) times daily. 10/26/17  Yes Fritzi Mandes, MD  atorvastatin (LIPITOR) 10 MG tablet Take 10 mg by mouth every evening.    Yes [provider]  azelastine (OPTIVAR) 0.05 % ophthalmic solution Place 1 drop into both eyes 2 (two) times daily as needed (allergies).   Yes [provider]  calcium acetate (PHOSLO) 667 MG capsule Take 1,334 mg by mouth 3 (three) times daily with meals.   Yes [provider]  Cholecalciferol (VITAMIN D3) 2000 units TABS Take 2,000 Units by mouth daily.    Yes [provider]  dextromethorphan-guaiFENesin (MUCINEX DM) 30-600 MG 12hr tablet Take 1 tablet by mouth at bedtime as needed for cough.   Yes [provider]  DiphenhydrAMINE HCl, Sleep, (ZZZQUIL PO) Take 25 mg by mouth at bedtime as needed (sleep).    Yes [provider]  ethyl chloride spray APPLY A SMALL AMOUNT ON SKIN BEFORE NEEDLE INSERTION THREE TIMES PER WEEK 12/29/17  Yes [provider]  febuxostat (ULORIC) 40 MG tablet Take 40 mg by mouth every evening.    Yes [provider]  levothyroxine (SYNTHROID, LEVOTHROID) 25 MCG tablet Take 1 tablet (25 mcg total) by  mouth daily before breakfast. 11/22/17  Yes Deboraha Sprang, MD  metoprolol succinate (TOPROL-XL) 25 MG 24 hr tablet Take 0.5 mg (12.5 mg) by mouth once daily - ON HOLD ( starting 12/29/17) Patient not taking: Reported on 01/02/2018 12/29/17  Yes Deboraha Sprang, MD  montelukast (SINGULAIR) 10 MG tablet Take 10 mg by mouth every evening.    Yes [provider]  multivitamin (RENA-VIT) TABS tablet Take 1 tablet by mouth daily.   Yes [provider]  pantoprazole (PROTONIX) 40 MG tablet Take 40 mg by mouth daily before breakfast.    Yes [provider]  umeclidinium-vilanterol (ANORO ELLIPTA) 62.5-25 MCG/INH AEPB Inhale 1 puff into the lungs daily.   Yes [provider]     Allergies: Allergies  Allergen Reactions  . Nsaids Nausea And Vomiting    Anti-inflammatories.  . Amoxicillin-Pot Clavulanate Nausea And Vomiting    Has patient had a PCN reaction causing immediate rash, facial/tongue/throat swelling, SOB or lightheadedness with hypotension: No Has patient had a PCN reaction causing severe rash involving mucus membranes or skin necrosis: No Has patient had a PCN reaction that required hospitalization:Yes-vomiting blood Has patient had a PCN reaction occurring within the last 10 years: Yes If all of the above answers are "NO", then may proceed with Cephalosporin use.     Review of Systems: Review of Systems  Constitutional: Negative for chills and fever.  Gastrointestinal: Negative for abdominal pain, constipation, diarrhea, nausea and vomiting.  Genitourinary: Positive for hematuria (and pneumaturia).    Physical Exam BP 112/74   Pulse 99   Temp 98.2 F (36.8 C) (Oral)   Ht 5\' 11"  (1.803 m)   Wt 113.5 kg (250 lb 3.2 oz)   BMI 34.90 kg/m  CONSTITUTIONAL: No acute distress RESPIRATORY:  Lungs are clear, and breath sounds are equal bilaterally. Normal respiratory effort without pathologic use of accessory muscles. CARDIOVASCULAR: Heart is regular without murmurs, gallops, or rubs. GI: The abdomen is soft, obese, nondistended, with only some discomfort to deep palpation of the pelvis. There were no palpable masses.  SKIN: Skin turgor is normal. There are no pathologic skin lesions.  NEUROLOGIC:  Motor and sensation is grossly normal.  Cranial nerves are grossly intact. PSYCH:  Alert and oriented to person, place and time. Affect is normal.  Labs/Imaging: Labs on 12/18/17 showed a normal CBC of 9.9.  Assessment and Plan: This is a 77 y.o. 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  The patient currently is  asymptomatic with no abdominal pain, fevers, chills, however he also was asymptomatic overall when his last CT scan was done last month.  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  This will require oral and IV contrast and this will have to be coordinated with his dialysis day so that he can undergo dialysis after the contrast is given.  Discussed with the patient that he may require a new drainage procedure and if so, with discussed with radiology about leaving a catheter in place rather than simple aspiration.  He could require antibiotics as well.  This will be decided based on the CT scan results.  The patient is aware that there is a chance that with conservative measures alone, the fistula may close, but  this depends on the size of the fistula and the degree of inflammation.  It may be that despite of conservative measures, the colovesicular fistula does not resolve and if that is the case he would require surgery which will be coordinated with Dr. Erlene Quan is a combined case.  At this point we will start first with a CT scan and proceed from there.  He understands this plan and all of his questions have been answered.  We will discuss further with Dr. Erlene Quan as well.  Face-to-face time spent with the patient and care providers was 40 minutes, with more than 50% of the time spent counseling, educating, and coordinating care of the patient.     Melvyn Neth, [place]

## 2018-01-02 NOTE — Patient Instructions (Addendum)
We have scheduled your CT scan 01/05/18 @ 10:30 at [facility]. Please stop by Radiology in the [place]   today to pick up your prep kit and instruction sheet. We will call you with the results and follow up accordingly as discussed in the office with Dr.Piscoya.

## 2018-01-02 NOTE — Progress Notes (Signed)
Patient here today for fistulogram per Dr Lucky Cowboy, attached to monitor to follow vitals throughout entire procedure.

## 2018-01-02 NOTE — Discharge Instructions (Signed)

## 2018-01-03 ENCOUNTER — Encounter: Payer: Self-pay | Admitting: Vascular Surgery

## 2018-01-04 ENCOUNTER — Telehealth: Payer: Self-pay | Admitting: Internal Medicine

## 2018-01-04 DIAGNOSIS — I484 Atypical atrial flutter: Secondary | ICD-10-CM

## 2018-01-04 DIAGNOSIS — R7989 Other specified abnormal findings of blood chemistry: Secondary | ICD-10-CM

## 2018-01-04 DIAGNOSIS — I480 Paroxysmal atrial fibrillation: Secondary | ICD-10-CM

## 2018-01-04 NOTE — Telephone Encounter (Signed)
Pt is concerned that his TSH labowork was "lost" when he was in for his cath. Please call to discuss.

## 2018-01-04 NOTE — Telephone Encounter (Signed)
I spoke with the patient- he is aware I have his TSH pulled for Dr. Caryl Comes to review. He was made aware of the result- TSH- 14.139. Amiodarone was decreased to 200 mg once daily on 12/29/17 by Dr. Caryl Comes. On synthroid 25 mcg once daily.  Will review with Dr. Caryl Comes on 1/10 and call the patient back.

## 2018-01-05 ENCOUNTER — Ambulatory Visit
Admission: RE | Admit: 2018-01-05 | Discharge: 2018-01-05 | Disposition: A | Discharge status: Home / Self Care | Payer: Medicare Other | Source: Ambulatory Visit | Attending: Surgery | Admitting: Surgery

## 2018-01-05 ENCOUNTER — Telehealth: Payer: Self-pay

## 2018-01-05 DIAGNOSIS — Z8719 Personal history of other diseases of the digestive system: Secondary | ICD-10-CM

## 2018-01-05 DIAGNOSIS — K5792 Diverticulitis of intestine, part unspecified, without perforation or abscess without bleeding: Secondary | ICD-10-CM

## 2018-01-05 DIAGNOSIS — I700 Atherosclerosis of aorta: Secondary | ICD-10-CM | POA: Diagnosis not present

## 2018-01-05 DIAGNOSIS — N261 Atrophy of kidney (terminal): Secondary | ICD-10-CM | POA: Diagnosis not present

## 2018-01-05 HISTORY — DX: Heart failure, unspecified: I50.9

## 2018-01-05 HISTORY — DX: Disorder of kidney and ureter, unspecified: N28.9

## 2018-01-05 MED ORDER — CIPROFLOXACIN HCL 500 MG PO TABS: 500.0000 mg | ORAL_TABLET | Freq: Two times a day (BID) | ORAL | 0 refills | Status: DC

## 2018-01-05 MED ORDER — IOPAMIDOL (ISOVUE-300) INJECTION 61%
100.0000 mL | Freq: Once | INTRAVENOUS | Status: AC | PRN
  Administered 2018-01-05: 100 mL via INTRAVENOUS

## 2018-01-05 MED ORDER — METRONIDAZOLE 500 MG PO TABS: 500.0000 mg | ORAL_TABLET | Freq: Three times a day (TID) | ORAL | 0 refills | Status: DC

## 2018-01-05 MED ORDER — LEVOTHYROXINE SODIUM 50 MCG PO TABS: 50.0000 ug | ORAL_TABLET | Freq: Every day | ORAL | 3 refills | Status: AC

## 2018-01-05 NOTE — Telephone Encounter (Signed)
Reviewed TSH with Dr. Caryl Comes- orders received to increase synthroid to 50 mcg once daily. Recheck a TSH in 6 weeks.  I left a message for the patient to call.

## 2018-01-05 NOTE — Telephone Encounter (Signed)
Patient returning call.

## 2018-01-05 NOTE — Telephone Encounter (Signed)
Stop Eliquis on Saturday. Start Antibiotics today.  Please arrive at [facility] registration @ 1:45 pm  01/09/18 and fasting for 6 hours prior.   Patient notified of follow up appointment 01/25/18 @ 10:30 pm.

## 2018-01-05 NOTE — Telephone Encounter (Signed)
Spoke with Pamala Hurry in speciality scheduling to schedule procedure however she stated she would return my call once she has arranged an appointment.

## 2018-01-05 NOTE — Telephone Encounter (Signed)
S/w patient. He verbalized understanding of results and to increase levothyroxine to 50 mcg daily and to recheck lab TSH in 6 weeks. He knows to go to [facility] for the lab work.

## 2018-01-05 NOTE — Telephone Encounter (Signed)
Patients calling to get his ct results please call patient and advise.

## 2018-01-06 ENCOUNTER — Telehealth: Payer: Self-pay | Admitting: Internal Medicine

## 2018-01-06 ENCOUNTER — Other Ambulatory Visit: Payer: Self-pay | Admitting: Radiology

## 2018-01-06 ENCOUNTER — Telehealth: Payer: Self-pay | Admitting: Cardiovascular Disease

## 2018-01-06 ENCOUNTER — Telehealth: Payer: Self-pay

## 2018-01-06 NOTE — Telephone Encounter (Signed)
Pt takes Eliquis for afib with CHADS2VASc score of 5 (age x2, HTN, CAD, CHF). Pt has ESRD with most recent creatinine close to 8. He was previously on warfarin but switched to Eliquis due to unstable INR associated with Vitamin K given to facilitate HD catheter insertion.  Most recent office visit with Dr Caryl Comes cites [facility] study comparing Eliquis and warfarin and lower bleed risk associated with Eliquis. However, PK/PD information still limited in this patient population and elimination time remains unclear.  Will need to forward to Dr Caryl Comes regarding appropriate timeframe to hold Eliquis 5mg  BID due to pt's ESRD. I am unsure it will be eliminated by pt's procedure date of 1/14, even if he starts holding today.

## 2018-01-06 NOTE — Telephone Encounter (Signed)
Reviewed with Dr. Caryl Comes- ok for the patient to start holding eliquis tonight. He may resume post procedure when the performing physician says this is safe for him to do so.   The patient is aware and agreeable.

## 2018-01-06 NOTE — Telephone Encounter (Signed)
° °  [facility] Pre-operative Risk Assessment    Request for surgical clearance:  1. What type of surgery is being performed? CT  Guided drainage by Percutaneous   2. When is this surgery scheduled? 01/09/18   3. Are there any medications that need to be held prior to surgery and how long? Eliquis   Practice name and name of physician performing surgery?  [facility] unknown  4. What is your office phone and fax number? [phone] fax [phone]  5. Anesthesia type (None, local, MAC, general) ? Unknown    ___________________________________________   (provider comments below)

## 2018-01-06 NOTE — Telephone Encounter (Signed)
Routed via Epic to Dr. Hampton Abbot.

## 2018-01-06 NOTE — Telephone Encounter (Signed)
Anti-coagulant clearance faxed to Dr.Hayes and Dr.Arida Muhammad this time.

## 2018-01-06 NOTE — Telephone Encounter (Signed)
Will forward to Dr. Klein to review. 

## 2018-01-06 NOTE — Telephone Encounter (Signed)
° °  [facility] Pre-operative Risk Assessment    Request for surgical clearance:  1. What type of surgery is being performed? CT guided drainage by Percutaneous   2. When is this surgery scheduled? 01/09/18   3. Are there any medications that need to be held prior to surgery and how long? Eliquis   4. Practice name and name of physician performing surgery? Not listed   5. What is your office phone and fax number?  [facility]  phone [phone] Fax [phone]  6. Anesthesia type (None, local, MAC, general) not listed    ______________________________________________

## 2018-01-09 ENCOUNTER — Ambulatory Visit
Admission: RE | Admit: 2018-01-09 | Discharge: 2018-01-09 | Disposition: A | Discharge status: Home / Self Care | Payer: Medicare Other | Source: Ambulatory Visit | Attending: Surgery | Admitting: Surgery

## 2018-01-09 ENCOUNTER — Other Ambulatory Visit: Payer: Self-pay | Admitting: Surgery

## 2018-01-09 DIAGNOSIS — Z7901 Long term (current) use of anticoagulants: Secondary | ICD-10-CM | POA: Diagnosis not present

## 2018-01-09 DIAGNOSIS — Z79899 Other long term (current) drug therapy: Secondary | ICD-10-CM | POA: Diagnosis not present

## 2018-01-09 DIAGNOSIS — Z8546 Personal history of malignant neoplasm of prostate: Secondary | ICD-10-CM | POA: Insufficient documentation

## 2018-01-09 DIAGNOSIS — Z8719 Personal history of other diseases of the digestive system: Secondary | ICD-10-CM

## 2018-01-09 DIAGNOSIS — I4892 Unspecified atrial flutter: Secondary | ICD-10-CM | POA: Insufficient documentation

## 2018-01-09 DIAGNOSIS — I252 Old myocardial infarction: Secondary | ICD-10-CM | POA: Diagnosis not present

## 2018-01-09 DIAGNOSIS — Z87891 Personal history of nicotine dependence: Secondary | ICD-10-CM | POA: Insufficient documentation

## 2018-01-09 DIAGNOSIS — I700 Atherosclerosis of aorta: Secondary | ICD-10-CM | POA: Diagnosis not present

## 2018-01-09 DIAGNOSIS — I2510 Atherosclerotic heart disease of native coronary artery without angina pectoris: Secondary | ICD-10-CM | POA: Diagnosis not present

## 2018-01-09 DIAGNOSIS — E785 Hyperlipidemia, unspecified: Secondary | ICD-10-CM | POA: Diagnosis not present

## 2018-01-09 DIAGNOSIS — Z955 Presence of coronary angioplasty implant and graft: Secondary | ICD-10-CM | POA: Insufficient documentation

## 2018-01-09 DIAGNOSIS — I132 Hypertensive heart and chronic kidney disease with heart failure and with stage 5 chronic kidney disease, or end stage renal disease: Secondary | ICD-10-CM | POA: Diagnosis not present

## 2018-01-09 DIAGNOSIS — K219 Gastro-esophageal reflux disease without esophagitis: Secondary | ICD-10-CM | POA: Insufficient documentation

## 2018-01-09 DIAGNOSIS — N186 End stage renal disease: Secondary | ICD-10-CM | POA: Insufficient documentation

## 2018-01-09 DIAGNOSIS — N321 Vesicointestinal fistula: Secondary | ICD-10-CM | POA: Insufficient documentation

## 2018-01-09 DIAGNOSIS — N281 Cyst of kidney, acquired: Secondary | ICD-10-CM | POA: Diagnosis not present

## 2018-01-09 DIAGNOSIS — J449 Chronic obstructive pulmonary disease, unspecified: Secondary | ICD-10-CM | POA: Diagnosis not present

## 2018-01-09 DIAGNOSIS — I255 Ischemic cardiomyopathy: Secondary | ICD-10-CM | POA: Insufficient documentation

## 2018-01-09 DIAGNOSIS — K651 Peritoneal abscess: Secondary | ICD-10-CM | POA: Insufficient documentation

## 2018-01-09 DIAGNOSIS — N261 Atrophy of kidney (terminal): Secondary | ICD-10-CM | POA: Diagnosis not present

## 2018-01-09 DIAGNOSIS — K5730 Diverticulosis of large intestine without perforation or abscess without bleeding: Secondary | ICD-10-CM | POA: Diagnosis not present

## 2018-01-09 DIAGNOSIS — M4317 Spondylolisthesis, lumbosacral region: Secondary | ICD-10-CM | POA: Insufficient documentation

## 2018-01-09 DIAGNOSIS — Z923 Personal history of irradiation: Secondary | ICD-10-CM | POA: Insufficient documentation

## 2018-01-09 DIAGNOSIS — Z992 Dependence on renal dialysis: Secondary | ICD-10-CM | POA: Insufficient documentation

## 2018-01-09 DIAGNOSIS — I509 Heart failure, unspecified: Secondary | ICD-10-CM | POA: Diagnosis not present

## 2018-01-09 LAB — COMPREHENSIVE METABOLIC PANEL
ALK PHOS: 49 U/L (ref 38–126)
ALT: 31 U/L (ref 17–63)
ANION GAP: 16 — AB (ref 5–15)
AST: 45 U/L — ABNORMAL HIGH (ref 15–41)
Albumin: 3.7 g/dL (ref 3.5–5.0)
BUN: 25 mg/dL — ABNORMAL HIGH (ref 6–20)
CALCIUM: 8.7 mg/dL — AB (ref 8.9–10.3)
CO2: 27 mmol/L (ref 22–32)
CREATININE: 4.16 mg/dL — AB (ref 0.61–1.24)
Chloride: 95 mmol/L — ABNORMAL LOW (ref 101–111)
GFR, EST AFRICAN AMERICAN: 15 mL/min — AB (ref >60)
GFR, EST NON AFRICAN AMERICAN: 13 mL/min — AB (ref >60)
Glucose, Bld: 105 mg/dL — ABNORMAL HIGH (ref 65–99)
Potassium: 4.4 mmol/L (ref 3.5–5.1)
Sodium: 138 mmol/L (ref 135–145)
Total Bilirubin: 0.8 mg/dL (ref 0.3–1.2)
Total Protein: 7.8 g/dL (ref 6.5–8.1)

## 2018-01-09 LAB — CBC WITH DIFFERENTIAL/PLATELET
Basophils Absolute: 0.1 10*3/uL (ref 0–0.1)
Basophils Relative: 1 %
EOS ABS: 0.1 10*3/uL (ref 0–0.7)
EOS PCT: 1 %
HCT: 36.4 % — ABNORMAL LOW (ref 40.0–52.0)
HEMOGLOBIN: 11.7 g/dL — AB (ref 13.0–18.0)
LYMPHS ABS: 1.9 10*3/uL (ref 1.0–3.6)
LYMPHS PCT: 15 %
MCH: 29.9 pg (ref 26.0–34.0)
MCHC: 32.3 g/dL (ref 32.0–36.0)
MCV: 92.6 fL (ref 80.0–100.0)
Monocytes Absolute: 1.3 10*3/uL — ABNORMAL HIGH (ref 0.2–1.0)
Monocytes Relative: 10 %
Neutro Abs: 8.9 10*3/uL — ABNORMAL HIGH (ref 1.4–6.5)
Neutrophils Relative %: 73 %
PLATELETS: 234 10*3/uL (ref 150–440)
RBC: 3.93 MIL/uL — AB (ref 4.40–5.90)
RDW: 18.3 % — ABNORMAL HIGH (ref 11.5–14.5)
WBC: 12.3 10*3/uL — AB (ref 3.8–10.6)

## 2018-01-09 LAB — PROTIME-INR
INR: 1.21
PROTHROMBIN TIME: 15.2 s (ref 11.4–15.2)

## 2018-01-09 MED ORDER — FENTANYL CITRATE (PF) 100 MCG/2ML IJ SOLN
INTRAMUSCULAR | Status: AC | PRN
  Administered 2018-01-09 (×4): 25 ug via INTRAVENOUS
  Administered 2018-01-09: 50 ug via INTRAVENOUS

## 2018-01-09 MED ORDER — VANCOMYCIN HCL IN DEXTROSE 1-5 GM/200ML-% IV SOLN
1000.0000 mg | Freq: Once | INTRAVENOUS | Status: AC
  Administered 2018-01-09: 1000 mg via INTRAVENOUS
  Filled 2018-01-09: qty 200

## 2018-01-09 MED ORDER — LIDOCAINE HCL (PF) 1 % IJ SOLN
INTRAMUSCULAR | Status: AC | PRN
  Administered 2018-01-09: 10 mL

## 2018-01-09 MED ORDER — MIDAZOLAM HCL 5 MG/5ML IJ SOLN
INTRAMUSCULAR | Status: AC | PRN
  Administered 2018-01-09 (×3): 1 mg via INTRAVENOUS
  Administered 2018-01-09 (×2): 0.5 mg via INTRAVENOUS

## 2018-01-09 MED ORDER — FENTANYL CITRATE (PF) 100 MCG/2ML IJ SOLN
INTRAMUSCULAR | Status: AC
  Filled 2018-01-09: qty 4

## 2018-01-09 MED ORDER — SODIUM CHLORIDE 0.9 % IV SOLN
INTRAVENOUS | Status: DC
  Administered 2018-01-09: 15:00:00 via INTRAVENOUS

## 2018-01-09 MED ORDER — MIDAZOLAM HCL 5 MG/5ML IJ SOLN
INTRAMUSCULAR | Status: AC
  Filled 2018-01-09: qty 5

## 2018-01-09 NOTE — Discharge Instructions (Signed)
Moderate Conscious Sedation, Adult Sedation is the use of medicines to promote relaxation and relieve discomfort and anxiety. Moderate conscious sedation is a type of sedation. Under moderate conscious sedation, you are less alert than normal, but you are still able to respond to instructions, touch, or both. Moderate conscious sedation is used during short medical and dental procedures. It is milder than deep sedation, which is a type of sedation under which you cannot be easily woken up. It is also milder than general anesthesia, which is the use of medicines to make you unconscious. Moderate conscious sedation allows you to return to your regular activities sooner. Tell a health care provider about:  Any allergies you have.  All medicines you are taking, including vitamins, herbs, eye drops, creams, and over-the-counter medicines.  Use of steroids (by mouth or creams).  Any problems you or family members have had with sedatives and anesthetic medicines.  Any blood disorders you have.  Any surgeries you have had.  Any medical conditions you have, such as sleep apnea.  Whether you are pregnant or may be pregnant.  Any use of cigarettes, alcohol, marijuana, or street drugs. What are the risks? Generally, this is a safe procedure. However, problems may occur, including:  Getting too much medicine (oversedation).  Nausea.  Allergic reaction to medicines.  Trouble breathing. If this happens, a breathing tube may be used to help with breathing. It will be removed when you are awake and breathing on your own.  Heart trouble.  Lung trouble.  What happens before the procedure? Staying hydrated Follow instructions from your health care provider about hydration, which may include:  Up to 2 hours before the procedure - you may continue to drink clear liquids, such as water, clear fruit juice, black coffee, and plain tea.  Eating and drinking restrictions Follow instructions from  your health care provider about eating and drinking, which may include:  8 hours before the procedure - stop eating heavy meals or foods such as meat, fried foods, or fatty foods.  6 hours before the procedure - stop eating light meals or foods, such as toast or cereal.  6 hours before the procedure - stop drinking milk or drinks that contain milk.  2 hours before the procedure - stop drinking clear liquids.  Medicine  Ask your health care provider about:  Changing or stopping your regular medicines. This is especially important if you are taking diabetes medicines or blood thinners.  Taking medicines such as aspirin and ibuprofen. These medicines can thin your blood. Do not take these medicines before your procedure if your health care provider instructs you not to.  Tests and exams  You will have a physical exam.  You may have blood tests done to show: ? How well your kidneys and liver are working. ? How well your blood can clot. General instructions  Plan to have someone take you home from the hospital or clinic.  If you will be going home right after the procedure, plan to have someone with you for 24 hours. What happens during the procedure?  An IV tube will be inserted into one of your veins.  Medicine to help you relax (sedative) will be given through the IV tube.  The medical or dental procedure will be performed. What happens after the procedure?  Your blood pressure, heart rate, breathing rate, and blood oxygen level will be monitored often until the medicines you were given have worn off.  Do not drive for 24 hours.  In event you start bleeding under bandaid lower abdomen, hold pressure. Call 911 If feel anything is life threatening or bleeding uncontrolled.    This information is not intended to replace advice given to you by your health care provider. Make sure you discuss any questions you have with your health care provider. Document Released: 09/07/2001  Document Revised: 05/18/2016 Document Reviewed: 04/03/2016 Elsevier Interactive Patient Education  [employer].

## 2018-01-09 NOTE — H&P (Signed)
Chief Complaint: Patient was seen in consultation today for intra-abdominal abscess  Referring Physician(s): [place]  Supervising Physician: Aletta Edouard  Patient Status: [facility] - Out-pt  History of Present Illness: Maurice Peterson is a 77 y.o. male with past medical history of atrial flutter, CAD, CHF, COPD, ESRD, HTN, HLD who was first seen for acute diverticulitis with abscess on 12/01/17.  He was admitted to the hospital and underwent CT-guided aspiration by Dr. Annamaria Boots on 12/7.  He was discharged home with outpatient follow-up, however developed hematuria and pneumaturia.  He has otherwise been asymptomatic with no abdominal pain, fever, or chills.   CT Abd/Pelvis 01/05/18 showed: 1. Persistent abscess collection interposed between the sigmoid colon and superior bladder wall, measuring 3.1 cm greatest dimension. 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 2. Extensive diverticulosis of the sigmoid and descending colon. The abscess collection is the result of a previous episode of acute diverticulitis. No pericolonic fluid or inflammatory change seen to suggest acute diverticulitis at this time. 3. Atrophic kidneys. 4. Aortic atherosclerosis.  IR consulted for aspiration and drainage of persistent collection.  Case reviewed by Dr. Kathlene Cote who approves patient for procedure.   He presents today in his usual state of health.  He has been NPO.  He has held his Eliquis since last Thursday.   Past Medical History:  Diagnosis Date  . Arthritis   . Atrial flutter ([place])    a. s/p DCCV 07/2017 briefly successful; b. amio and warfarin; c. CHADS2VASc => 5 (CHF, HTN, age x 2, vascular disease);  [name] CAD (coronary artery disease)    a. remote inferior MI s/p stenting; b. Myoview in 10/2016 with normal perfusion with an EF of 46%  . CHF (congestive heart failure)  ([place])   . COPD (chronic obstructive pulmonary disease) ([place])   . ESRD (end stage renal disease) on dialysis ([place])    a. HD MWF  . GERD (gastroesophageal reflux disease)   . Hyperlipidemia   . Hypertension   . Ischemic cardiomyopathy    a. TTE 05/2017: EF 35-40%, unable to exlcude RWMA, mild MR, mildly dilated RV  . Prostate cancer ([place])   . Renal insufficiency   . Shortness of breath dyspnea     Past Surgical History:  Procedure Laterality Date  . A/V SHUNT INTERVENTION Left 10/24/2017   Procedure: A/V SHUNT INTERVENTION;  Surgeon: Algernon Huxley, MD;  Location: [place] [facility];  Service: Cardiovascular;  Laterality: Left;  . CARDIOVERSION N/A 07/26/2017   Procedure: Cardioversion;  Surgeon: Corey Skains, MD;  Location: [facility];  Service: Cardiovascular;  Laterality: N/A;  . CARDIOVERSION N/A 08/18/2017   Procedure: CARDIOVERSION;  Surgeon: Corey Skains, MD;  Location: [facility];  Service: Cardiovascular;  Laterality: N/A;  . CARDIOVERSION N/A 10/21/2017   Procedure: CARDIOVERSION;  Surgeon: Wellington Hampshire, MD;  Location: [facility];  Service: Cardiovascular;  Laterality: N/A;  . CARDIOVERSION N/A 11/25/2017   Procedure: CARDIOVERSION;  Surgeon: Wellington Hampshire, MD;  Location: [facility];  Service: Cardiovascular;  Laterality: N/A;  . CATARACT EXTRACTION W/PHACO Right 08/20/2015   Procedure: CATARACT EXTRACTION PHACO AND INTRAOCULAR LENS PLACEMENT ([place]);  Surgeon: Leandrew Koyanagi, MD;  Location: [place];  Service: Ophthalmology;  Laterality: Right;  . CATARACT EXTRACTION W/PHACO Left 09/10/2015   Procedure: CATARACT EXTRACTION PHACO AND INTRAOCULAR LENS PLACEMENT ([facility]);  Surgeon: Leandrew Koyanagi, MD;  Location: [facility];  Service: Ophthalmology;  Laterality: Left;  . CORONARY STENT PLACEMENT    . 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 PERIPHERAL VASCULAR CATHETERIZATION Left 05/20/2015   Procedure: A/V Shuntogram/Fistulagram;   Surgeon: Katha Cabal, MD;  Location: [place] [facility];  Service: Cardiovascular;  Laterality: Left;  . PERIPHERAL VASCULAR CATHETERIZATION N/A 05/20/2015   Procedure: A/V Shunt Intervention;  Surgeon: Katha Cabal, MD;  Location: [place] [facility];  Service: Cardiovascular;  Laterality: N/A;  . PERIPHERAL VASCULAR CATHETERIZATION Left 08/05/2015   Procedure: A/V Shuntogram/Fistulagram;  Surgeon: Katha Cabal, MD;  Location: [place] [facility];  Service: Cardiovascular;  Laterality: Left;  . PERIPHERAL VASCULAR CATHETERIZATION N/A 08/05/2015   Procedure: A/V Shunt Intervention;  Surgeon: Katha Cabal, MD;  Location: [place] [facility];  Service: Cardiovascular;  Laterality: N/A;  . PERIPHERAL VASCULAR CATHETERIZATION Left 01/06/2016   Procedure: A/V Shuntogram/Fistulagram;  Surgeon: Katha Cabal, MD;  Location: [place] [facility];  Service: Cardiovascular;  Laterality: Left;  . PERIPHERAL VASCULAR CATHETERIZATION N/A 01/06/2016   Procedure: A/V Shunt Intervention;  Surgeon: Katha Cabal, MD;  Location: [place] [facility];  Service: Cardiovascular;  Laterality: N/A;  . PERIPHERAL VASCULAR CATHETERIZATION N/A 12/13/2016   Procedure: Dialysis/Perma Catheter Removal;  Surgeon: Algernon Huxley, MD;  Location: [place] [facility];  Service: Cardiovascular;  Laterality: N/A;  . PERIPHERAL VASCULAR THROMBECTOMY Left 01/02/2018   Procedure: PERIPHERAL VASCULAR THROMBECTOMY;  Surgeon: Algernon Huxley, MD;  Location: [place] [facility];  Service: Cardiovascular;  Laterality: Left;    Allergies: Nsaids and Amoxicillin-pot clavulanate  Medications: Prior to Admission medications   Medication Sig Start Date End Date Taking? Authorizing Provider  acetaminophen (TYLENOL) 500 MG tablet Take 1,000 mg by mouth daily as needed for moderate pain or headache.    [provider]  albuterol (PROVENTIL HFA;VENTOLIN HFA) 108 (90 BASE) MCG/ACT inhaler Inhale 2 puffs into the  lungs every 4 (four) hours as needed for wheezing or shortness of breath.    [provider]  amiodarone (PACERONE) 200 MG tablet Take 1 tablet (200 mg) by mouth once daily 12/29/17   Deboraha Sprang, MD  apixaban (ELIQUIS) 5 MG TABS tablet Take 1 tablet (5 mg total) by mouth 2 (two) times daily. 10/26/17   Fritzi Mandes, MD  atorvastatin (LIPITOR) 10 MG tablet Take 10 mg by mouth every evening.     [provider]  azelastine (OPTIVAR) 0.05 % ophthalmic solution Place 1 drop into both eyes 2 (two) times daily as needed (allergies).    [provider]  calcium acetate (PHOSLO) 667 MG capsule Take 1,334 mg by mouth 3 (three) times daily with meals.    [provider]  Cholecalciferol (VITAMIN D3) 2000 units TABS Take 2,000 Units by mouth daily.     [provider]  ciprofloxacin (CIPRO) 500 MG tablet Take 1 tablet (500 mg total) by mouth 2 (two) times daily. 01/05/18   Olean Ree, MD  dextromethorphan-guaiFENesin (MUCINEX DM) 30-600 MG 12hr tablet Take 1 tablet by mouth at bedtime as needed for cough.    [provider]  DiphenhydrAMINE HCl, Sleep, (ZZZQUIL PO) Take 25 mg by mouth at bedtime as needed (sleep).     [provider]  ethyl chloride spray APPLY A SMALL AMOUNT ON SKIN BEFORE NEEDLE INSERTION THREE TIMES PER WEEK 12/29/17   [provider]  febuxostat (ULORIC) 40 MG tablet Take 40 mg by  mouth every evening.     [provider]  levothyroxine (SYNTHROID) 50 MCG tablet Take 1 tablet (50 mcg total) by mouth daily before breakfast. 01/05/18   Deboraha Sprang, MD  metoprolol succinate (TOPROL-XL) 25 MG 24 hr tablet Take 0.5 mg (12.5 mg) by mouth once daily - ON HOLD ( starting 12/29/17) Patient not taking: Reported on 01/02/2018 12/29/17   Deboraha Sprang, MD  metroNIDAZOLE (FLAGYL) 500 MG tablet Take 1 tablet (500 mg total) by mouth 3 (three) times daily. 01/05/18   Piscoya, Jacqulyn Bath, MD  montelukast (SINGULAIR) 10 MG tablet Take  10 mg by mouth every evening.     [provider]  multivitamin (RENA-VIT) TABS tablet Take 1 tablet by mouth daily.    [provider]  pantoprazole (PROTONIX) 40 MG tablet Take 40 mg by mouth daily before breakfast.     [provider]  umeclidinium-vilanterol (ANORO ELLIPTA) 62.5-25 MCG/INH AEPB Inhale 1 puff into the lungs daily.    [provider]     Family History  Problem Relation Age of Onset  . Liver disease Mother   . Heart disease Mother   . Hypertension Mother   . Pancreatic cancer Father   . Kidney cancer Neg Hx   . Kidney disease Neg Hx   . Prostate cancer Neg Hx     Social History   Socioeconomic History  . Marital status: Married    Spouse name: Not on file  . Number of children: Not on file  . Years of education: Not on file  . Highest education level: Not on file  Social Needs  . Financial resource strain: Not on file  . Food insecurity - worry: Not on file  . Food insecurity - inability: Not on file  . Transportation needs - medical: Not on file  . Transportation needs - non-medical: Not on file  Occupational History  . Not on file  Tobacco Use  . Smoking status: Former Smoker    Packs/day: 1.00    Years: 30.00    Pack years: 30.00    Types: Cigarettes    Last attempt to quit: 05/19/2009    Years since quitting: 8.6  . Smokeless tobacco: Never Used  Substance and Sexual Activity  . Alcohol use: No  . Drug use: No  . Sexual activity: Yes  Other Topics Concern  . Not on file  Social History Narrative  . Not on file    Review of Systems  Constitutional: Negative for fatigue and fever.  Respiratory: Negative for cough and shortness of breath.   Cardiovascular: Negative for chest pain.  Gastrointestinal: Negative for abdominal pain.  Musculoskeletal: Negative for back pain.  Psychiatric/Behavioral: Negative for behavioral problems and confusion.    Vital Signs: There were no vitals taken for this  visit.  Physical Exam  Constitutional: He is oriented to person, place, and time. He appears well-developed.  Cardiovascular: Normal rate, regular rhythm and normal heart sounds.  Pulmonary/Chest: Effort normal and breath sounds normal. No respiratory distress.  Abdominal: Soft. Bowel sounds are normal.  Neurological: He is alert and oriented to person, place, and time.  Skin: Skin is warm and dry.  Psychiatric: He has a normal mood and affect. His behavior is normal. Judgment and thought content normal.  Nursing note and vitals reviewed.   Imaging: Ct Abdomen Pelvis W Contrast  Result Date: 01/05/2018 CLINICAL DATA:  History of diverticulitis. Per patient, abscess drained approximately 1 month ago. History of prostate cancer  with radiation treatment. Follow-up study today to evaluate diverticulitis. EXAM: CT ABDOMEN AND PELVIS WITH CONTRAST TECHNIQUE: Multidetector CT imaging of the abdomen and pelvis was performed using the standard protocol following bolus administration of intravenous contrast. CONTRAST:  180mL ISOVUE-300 IOPAMIDOL (ISOVUE-300) INJECTION 61% COMPARISON:  CT abdomen dated 11/30/2017. FINDINGS: Lower chest: No acute abnormality. Hepatobiliary: No focal liver abnormality is seen. No gallstones, gallbladder wall thickening, or biliary dilatation. Pancreas: Unremarkable. No pancreatic ductal dilatation or surrounding inflammatory changes. Spleen: Normal in size without focal abnormality. Adrenals/Urinary Tract: Atrophic kidneys. Small bilateral renal cysts. No hydronephrosis. No ureteral or bladder stone. Bladder is decompressed. Stomach/Bowel: Extensive diverticulosis of the sigmoid colon. No dilated large or small bowel loops. Appendix is normal. Stomach is unremarkable. Vascular/Lymphatic: Aortic atherosclerosis. No enlarged abdominal or pelvic lymph nodes. Reproductive: Prostate is unremarkable. Other: Persistent abscess collection interposed between the sigmoid colon and superior  bladder wall, measuring 3.1 x 2.8 x 2.3 cm. 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 No other fluid collection or abscess.  No free intraperitoneal air. Musculoskeletal: Degenerative changes throughout the thoracolumbar spine, overall moderate in degree. 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 No acute or suspicious osseous finding. IMPRESSION: 1. Persistent abscess collection interposed between the sigmoid colon and superior bladder wall, measuring 3.1 cm greatest dimension. 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 2. Extensive diverticulosis of the sigmoid and descending colon. The abscess collection is the result of a previous episode of acute diverticulitis. No pericolonic fluid or inflammatory change seen to suggest acute diverticulitis at this time. 3. Atrophic kidneys. 4. Aortic atherosclerosis. These results were called by telephone at the time of interpretation on 01/05/2018 at 2:00 pm to Dr. Olean Ree , who verbally acknowledged these results. Electronically Signed   By: Franki Cabot M.D.   On: 01/05/2018 14:05    Labs:  CBC: Recent Labs    12/01/17 1706 12/02/17 0417 12/03/17 0412 12/18/17 0622  WBC 11.6* 9.6 7.5 9.9  HGB 10.8* 10.4* 10.4* 11.5*  HCT 32.9* 31.8* 31.1* 35.8*  PLT 277 237 242 203    COAGS: Recent Labs    06/17/17 1430  10/19/17 1713  10/24/17 0422 10/24/17 1732 10/25/17 0118 10/26/17 0823 11/22/17 1045  INR 1.06   < > 2.57   < > 2.02  --  1.48 1.44 1.3*  APTT 34  --  37*  --   --  32  --   --   --    < > = values in this interval not displayed.    BMP: Recent Labs     12/01/17 1706 12/02/17 0417 12/03/17 0412 12/18/17 0622 01/02/18 1410  NA 135 135 139 137  --   K 4.7 4.5 4.2 4.7 5.8*  CL 94* 95* 100* 95*  --   CO2 27 27 29 28   --   GLUCOSE 102* 84 88 89  --   BUN 20 26* 14 37*  --   CALCIUM 8.4* 8.3* 8.3* 8.9  --   CREATININE 4.48* 5.53* 3.81* 7.81*  --   GFRNONAA 12* 9* 14* 6*  --   GFRAA 14* 10* 16* 7*  --     LIVER  FUNCTION TESTS: Recent Labs    09/12/17 1321 10/18/17 1030 10/19/17 1446 10/24/17 0928 10/26/17 1018 12/18/17 0622  BILITOT 1.0 0.5 1.1  --   --  0.9  AST 22 9 16   --   --  32  ALT 23 22 24   --   --  23  ALKPHOS 46 65 45  --   --  60  PROT 8.4* 6.5 6.9  --   --  7.3  ALBUMIN 4.3 4.2 3.7 3.1* 3.4* 3.4*    TUMOR MARKERS: No results for input(s): AFPTM, CEA, CA199, CHROMGRNA in the last 8760 hours.  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  IR consulted for aspiration and drainage at the request of Dr. Hampton Abbot. Case reviewed by Dr. Kathlene Cote who approves patient for procedure.  Patient presents today in their usual state of health.  He has been NPO and has appropriately held his blood thinners.  Risks and benefits discussed with the patient including bleeding, infection, damage to adjacent structures, bowel perforation/fistula connection, and sepsis. All of the patient's questions were answered, patient is agreeable to proceed. Consent signed and in chart.  Thank you for this interesting consult.  I greatly enjoyed meeting BERNHARDT RIEMENSCHNEIDER and look forward to participating in their care.  A copy of this report was sent to the requesting provider on this date.  Electronically Signed: Docia Barrier, PA 01/09/2018, 2:24 PM   I spent a total of  30 Minutes   in face to face in clinical consultation, greater than 50% of which was counseling/coordinating care for intra-abdominal abscess.

## 2018-01-09 NOTE — Procedures (Signed)
Interventional Radiology Procedure Note  Procedure: CT guided aspiration/wire advancement  Complications: None  Estimated Blood Loss: < 10 mL  Findings: Air pocket punctured at level of apparent colovesical fistula between sigmoid colon and bladder with 18 G needle. Aspiration yielded urine-like fluid. Wire advancement showed guidewire coiled in lumen of bladder. No indication to place drainage catheter given no purulent fluid return and clear communication directly with bladder lumen.  Recommend: Urologic referral and cystoscopy.  Venetia Night. Kathlene Cote, M.D Pager:  [phone]

## 2018-01-24 ENCOUNTER — Ambulatory Visit (INDEPENDENT_AMBULATORY_CARE_PROVIDER_SITE_OTHER): Payer: Medicare Other | Admitting: Vascular Surgery

## 2018-01-24 ENCOUNTER — Encounter (INDEPENDENT_AMBULATORY_CARE_PROVIDER_SITE_OTHER): Payer: Self-pay | Admitting: Vascular Surgery

## 2018-01-24 VITALS — BP 80/55 | HR 81 | Resp 16 | Wt 243.0 lb

## 2018-01-24 DIAGNOSIS — T829XXS Unspecified complication of cardiac and vascular prosthetic device, implant and graft, sequela: Secondary | ICD-10-CM | POA: Diagnosis not present

## 2018-01-24 DIAGNOSIS — N186 End stage renal disease: Secondary | ICD-10-CM | POA: Diagnosis not present

## 2018-01-24 NOTE — Progress Notes (Signed)
Subjective:    Patient ID: Maurice Peterson, male    DOB: 1941-05-07, 77 y.o.   MRN: [id number] Chief Complaint  Patient presents with  . Follow-up    3wk discuss AVF   Presents for his first post procedure follow-up.  The patient underwent a left radiocephalic arteriovenous fistula cannulation under ultrasound guidance in both a retrograde and then antegrade fashion crossing, left arm fistulagram and central venogram, catheter directed thrombolysis with 4mg  of TPA delivered with the AngioJet AVX catheter, mechanical rheolytic thrombectomy to the left radiocephalic AV fistula with the AngioJet AVX catheter, fogarty embolectomy for residual arterial plug, percutaneous transluminal angioplasty of forearm cephalic vein from about 2cm beyond the anastomosis to the antecubital fossa with multiple inflations with a 19mm x 10cm length high-pressure angioplasty on 01/02/18.  The patient presents today without complaint.  The patient denies any issues at dialysis at this time. The patient denies any issues with hemodialysis such as cannulation problems, increased bleeding, decrease in doppler flow or recirculation. The patient also denies any fistula skin breakdown, pain, edema, pallor or ulceration of the arm / hand.  The patient has had multiple interventions on his current dialysis access.  He presented today to discuss undergoing vein mapping for creation of a new access in the future.  At this time, the patient would like to put off vein mapping until his "next appointment with [place]" due to the amount of doctor's appointments that he has.  The patient denies any fever, nausea vomiting.   Review of Systems  Constitutional: Negative.   HENT: Negative.   Eyes: Negative.   Respiratory: Negative.   Cardiovascular: Negative.   Gastrointestinal: Negative.   Endocrine: Negative.   Genitourinary: Negative.        ESRD  Musculoskeletal: Negative.   Skin: Negative.   Allergic/Immunologic: Negative.   Neurological:  Negative.   Hematological: Negative.   Psychiatric/Behavioral: Negative.       Objective:   Physical Exam  Constitutional: He is oriented to person, place, and time. He appears well-developed and well-nourished. No distress.  HENT:  Head: Normocephalic and atraumatic.  Eyes: Conjunctivae are normal. Pupils are equal, round, and reactive to light.  Neck: Normal range of motion.  Cardiovascular: Normal rate, regular rhythm, normal heart sounds and intact distal pulses.  Pulses:      Radial pulses are 2+ on the right side, and 2+ on the left side.  Left upper extremity dialysis access: Good bruit and thrill.  Skin is intact.  Pulmonary/Chest: Effort normal and breath sounds normal.  Musculoskeletal: Normal range of motion. He exhibits no edema.  Neurological: He is alert and oriented to person, place, and time.  Skin: Skin is warm and dry. He is not diaphoretic.  Psychiatric: He has a normal mood and affect. His behavior is normal. Judgment and thought content normal.  Vitals reviewed.  BP (!) 80/55 (BP Location: Right Arm)   Pulse 81   Resp 16   Wt 243 lb (110.2 kg)   BMI 33.89 kg/m   Past Medical History:  Diagnosis Date  . Arthritis   . Atrial flutter ([place])    a. s/p DCCV 07/2017 briefly successful; b. amio and warfarin; c. CHADS2VASc => 5 (CHF, HTN, age x 2, vascular disease);  [name] CAD (coronary artery disease)    a. remote inferior MI s/p stenting; b. Myoview in 10/2016 with normal perfusion with an EF of 46%  . CHF (congestive heart failure) ([place])   . COPD (chronic obstructive  pulmonary disease) ([place])   . ESRD (end stage renal disease) on dialysis ([place])    a. HD MWF  . GERD (gastroesophageal reflux disease)   . Hyperlipidemia   . Hypertension   . Ischemic cardiomyopathy    a. TTE 05/2017: EF 35-40%, unable to exlcude RWMA, mild MR, mildly dilated RV  . Prostate cancer ([place])   . Renal insufficiency   . Shortness of breath dyspnea    Social History   Socioeconomic  History  . Marital status: Married    Spouse name: Not on file  . Number of children: Not on file  . Years of education: Not on file  . Highest education level: Not on file  Social Needs  . Financial resource strain: Not on file  . Food insecurity - worry: Not on file  . Food insecurity - inability: Not on file  . Transportation needs - medical: Not on file  . Transportation needs - non-medical: Not on file  Occupational History  . Not on file  Tobacco Use  . Smoking status: Former Smoker    Packs/day: 1.00    Years: 30.00    Pack years: 30.00    Types: Cigarettes    Last attempt to quit: 05/19/2009    Years since quitting: 8.6  . Smokeless tobacco: Never Used  Substance and Sexual Activity  . Alcohol use: No  . Drug use: No  . Sexual activity: Yes  Other Topics Concern  . Not on file  Social History Narrative  . Not on file   Past Surgical History:  Procedure Laterality Date  . A/V SHUNT INTERVENTION Left 10/24/2017   Procedure: A/V SHUNT INTERVENTION;  Surgeon: Algernon Huxley, MD;  Location: [place] [facility];  Service: Cardiovascular;  Laterality: Left;  . CARDIOVERSION N/A 07/26/2017   Procedure: Cardioversion;  Surgeon: Corey Skains, MD;  Location: [facility];  Service: Cardiovascular;  Laterality: N/A;  . CARDIOVERSION N/A 08/18/2017   Procedure: CARDIOVERSION;  Surgeon: Corey Skains, MD;  Location: [facility];  Service: Cardiovascular;  Laterality: N/A;  . CARDIOVERSION N/A 10/21/2017   Procedure: CARDIOVERSION;  Surgeon: Wellington Hampshire, MD;  Location: [facility];  Service: Cardiovascular;  Laterality: N/A;  . CARDIOVERSION N/A 11/25/2017   Procedure: CARDIOVERSION;  Surgeon: Wellington Hampshire, MD;  Location: [facility];  Service: Cardiovascular;  Laterality: N/A;  . CATARACT EXTRACTION W/PHACO Right 08/20/2015   Procedure: CATARACT EXTRACTION PHACO AND INTRAOCULAR LENS PLACEMENT ([place]);  Surgeon: Leandrew Koyanagi, MD;  Location: [place];  Service:  Ophthalmology;  Laterality: Right;  . CATARACT EXTRACTION W/PHACO Left 09/10/2015   Procedure: CATARACT EXTRACTION PHACO AND INTRAOCULAR LENS PLACEMENT (IOC);  Surgeon: Leandrew Koyanagi, MD;  Location: [place];  Service: Ophthalmology;  Laterality: Left;  . CORONARY STENT PLACEMENT    . 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 PERIPHERAL VASCULAR CATHETERIZATION Left 05/20/2015   Procedure: A/V Shuntogram/Fistulagram;  Surgeon: Katha Cabal, MD;  Location: [place] [facility];  Service: Cardiovascular;  Laterality: Left;  . PERIPHERAL VASCULAR CATHETERIZATION N/A 05/20/2015   Procedure: A/V Shunt Intervention;  Surgeon: Katha Cabal, MD;  Location: [place] [facility];  Service: Cardiovascular;  Laterality: N/A;  . PERIPHERAL VASCULAR CATHETERIZATION Left 08/05/2015   Procedure: A/V Shuntogram/Fistulagram;  Surgeon: Katha Cabal, MD;  Location: [place] [facility];  Service: Cardiovascular;  Laterality: Left;  . PERIPHERAL VASCULAR CATHETERIZATION N/A 08/05/2015   Procedure: A/V Shunt Intervention;  Surgeon: Dolores Lory  Schnier, MD;  Location: [place] [facility];  Service: Cardiovascular;  Laterality: N/A;  . PERIPHERAL VASCULAR CATHETERIZATION Left 01/06/2016   Procedure: A/V Shuntogram/Fistulagram;  Surgeon: Katha Cabal, MD;  Location: [place] [facility];  Service: Cardiovascular;  Laterality: Left;  . PERIPHERAL VASCULAR CATHETERIZATION N/A 01/06/2016   Procedure: A/V Shunt Intervention;  Surgeon: Katha Cabal, MD;  Location: [place] [facility];  Service: Cardiovascular;  Laterality: N/A;  . PERIPHERAL VASCULAR CATHETERIZATION N/A 12/13/2016   Procedure: Dialysis/Perma Catheter Removal;  Surgeon: Algernon Huxley, MD;  Location: [place] [facility];  Service: Cardiovascular;  Laterality: N/A;  . PERIPHERAL VASCULAR THROMBECTOMY Left 01/02/2018   Procedure: PERIPHERAL VASCULAR THROMBECTOMY;  Surgeon: Algernon Huxley, MD;  Location: [facility];  Service: Cardiovascular;  Laterality: Left;   Family History  Problem Relation Age of Onset  . Liver disease Mother   . Heart disease Mother   . Hypertension Mother   . Pancreatic cancer Father   . Kidney cancer Neg Hx   . Kidney disease Neg Hx   . Prostate cancer Neg Hx    Allergies  Allergen Reactions  . Nsaids Nausea And Vomiting    Anti-inflammatories.  . Amoxicillin-Pot Clavulanate Nausea And Vomiting    Has patient had a PCN reaction causing immediate rash, facial/tongue/throat swelling, SOB or lightheadedness with hypotension: No Has patient had a PCN reaction causing severe rash involving mucus membranes or skin necrosis: No Has patient had a PCN reaction that required hospitalization:Yes-vomiting blood Has patient had a PCN reaction occurring within the last 10 years: Yes If all of the above answers are "NO", then may proceed with Cephalosporin use.       Assessment & Plan:  Presents for his first post procedure follow-up.  The patient underwent a left radiocephalic arteriovenous fistula cannulation under ultrasound guidance in both a retrograde and then antegrade fashion crossing, left arm fistulagram and central venogram, catheter directed thrombolysis with 4mg  of TPA delivered with the AngioJet AVX catheter, mechanical rheolytic thrombectomy to the left radiocephalic AV fistula with the AngioJet AVX catheter, fogarty embolectomy for residual arterial plug, percutaneous transluminal angioplasty of forearm cephalic vein from about 2cm beyond the anastomosis to the antecubital fossa with multiple inflations with a 92mm x 10cm length high-pressure angioplasty on 01/02/18.  The patient presents today without complaint.  The patient denies any issues at dialysis at this time. The patient denies any issues with hemodialysis such as cannulation problems, increased bleeding, decrease in doppler flow or recirculation. The patient also denies any fistula skin breakdown, pain, edema,  pallor or ulceration of the arm / hand.  The patient has had multiple interventions on his current dialysis access.  He presented today to discuss undergoing vein mapping for creation of a new access in the future.  At this time, the patient would like to put off vein mapping until his "next appointment with [place]" due to the amount of doctor's appointments that he has.  The patient denies any fever, nausea vomiting.  1. ESRD (end stage renal disease) ([place]) - Stable Patient presents to discuss the possibility of undergoing upper extremity vein mapping for the creation of a new dialysis access At this point, the patient states that he is not having any issues with his current dialysis access He does understand that due to the multiple interventions required on his current access he is most likely going to need a new one in the near future At this time, the patient  would like to push off undergoing bilateral upper extremity vein mapping for 6 months due to the number of doctors appointments that he has He understands by waiting if his current access should fail he may need a PermCath while a new access is being created To follow-up in 6 months Patient to call the office sooner if he should start to experience issues with his current dialysis access  - VAS [place]; Future  2. Complication of vascular access for dialysis, sequela - Stable Encouraged good control as its slows the progression of atherosclerotic disease  Current Outpatient Medications on File Prior to Visit  Medication Sig Dispense Refill  . albuterol (PROVENTIL HFA;VENTOLIN HFA) 108 (90 BASE) MCG/ACT inhaler Inhale 2 puffs into the lungs every 4 (four) hours as needed for wheezing or shortness of breath.    [name] amiodarone (PACERONE) 200 MG tablet Take 1 tablet (200 mg) by mouth once daily 90 tablet 0  . apixaban (ELIQUIS) 5 MG TABS tablet Take 1 tablet (5 mg total) by mouth 2 (two) times daily. 60 tablet 2  .  atorvastatin (LIPITOR) 10 MG tablet Take 10 mg by mouth every evening.     . calcium acetate (PHOSLO) 667 MG capsule Take 1,334 mg by mouth 3 (three) times daily with meals.    . ciprofloxacin (CIPRO) 500 MG tablet Take 1 tablet (500 mg total) by mouth 2 (two) times daily. 28 tablet 0  . febuxostat (ULORIC) 40 MG tablet Take 40 mg by mouth every evening.     [name] levothyroxine (SYNTHROID) 50 MCG tablet Take 1 tablet (50 mcg total) by mouth daily before breakfast. 90 tablet 3  . metroNIDAZOLE (FLAGYL) 500 MG tablet Take 1 tablet (500 mg total) by mouth 3 (three) times daily. 42 tablet 0  . montelukast (SINGULAIR) 10 MG tablet Take 10 mg by mouth every evening.     . multivitamin (RENA-VIT) TABS tablet Take 1 tablet by mouth daily.    . pantoprazole (PROTONIX) 40 MG tablet Take 40 mg by mouth daily before breakfast.     . umeclidinium-vilanterol (ANORO ELLIPTA) 62.5-25 MCG/INH AEPB Inhale 1 puff into the lungs daily.    [name] acetaminophen (TYLENOL) 500 MG tablet Take 1,000 mg by mouth daily as needed for moderate pain or headache.    [name] azelastine (OPTIVAR) 0.05 % ophthalmic solution Place 1 drop into both eyes 2 (two) times daily as needed (allergies).    . Cholecalciferol (VITAMIN D3) 2000 units TABS Take 2,000 Units by mouth daily.     [name] dextromethorphan-guaiFENesin (MUCINEX DM) 30-600 MG 12hr tablet Take 1 tablet by mouth at bedtime as needed for cough.    . DiphenhydrAMINE HCl, Sleep, (ZZZQUIL PO) Take 25 mg by mouth at bedtime as needed (sleep).     [name] ethyl chloride spray APPLY A SMALL AMOUNT ON SKIN BEFORE NEEDLE INSERTION THREE TIMES PER WEEK  4  . metoprolol succinate (TOPROL-XL) 25 MG 24 hr tablet Take 0.5 mg (12.5 mg) by mouth once daily - ON HOLD ( starting 12/29/17) (Patient not taking: Reported on 01/02/2018)    . midodrine (PROAMATINE) 10 MG tablet Take 10 mg by mouth 3 (three) times daily.     Current Facility-Administered Medications on File Prior to Visit  Medication Dose Route Frequency  Provider Last Rate Last Dose  . 0.9 %  sodium chloride infusion    Continuous PRN Michelet, Colletta [place], CRNA       There are no Patient Instructions on file for  this visit. No Follow-up on file.  Lorynn Moeser A Draya Felker, PA-C

## 2018-01-25 ENCOUNTER — Ambulatory Visit (INDEPENDENT_AMBULATORY_CARE_PROVIDER_SITE_OTHER): Payer: Medicare Other | Admitting: Surgery

## 2018-01-25 ENCOUNTER — Encounter: Payer: Self-pay | Admitting: Surgery

## 2018-01-25 DIAGNOSIS — N321 Vesicointestinal fistula: Secondary | ICD-10-CM

## 2018-01-25 NOTE — Progress Notes (Signed)
01/25/2018  History of Present Illness: Maurice Peterson is a 77 y.o. male who presents with a known colovesical fistula.  He was initially seen in 12/01/17 with diverticulitis with abscess and concern for fistula based on symptoms.  He was treated with antibiotics and aspiration but was noted to have an area of inflammation on cystoscopy with Dr. Erlene Quan on 12/30/17.  He was seen in my office again on 1/7 and had an outpatient CT scan on 1/10 which showed again a recurrent abscess in the same location.  IR was unable to place a drain due to small size and because wire would go into bladder.  He completed a second course of antibiotics last week.  He denies any abdominal pain and reports that his only GI symptom was loose stools with the antibiotics.  This has improved after the course was completed.  He does have pneumaturia but has not had any UTI symptoms either.  He does report feeling weaker and reports that his blood pressure has been low for some time and has been on medication to increase his blood pressure.  His BP did decrease during dialysis yesterday and it had to be cut short.    Past Medical History: Past Medical History:  Diagnosis Date  . Arthritis   . Atrial flutter ([place])    a. s/p DCCV 07/2017 briefly successful; b. amio and warfarin; c. CHADS2VASc => 5 (CHF, HTN, age x 2, vascular disease);  [name] CAD (coronary artery disease)    a. remote inferior MI s/p stenting; b. Myoview in 10/2016 with normal perfusion with an EF of 46%  . CHF (congestive heart failure) ([place])   . COPD (chronic obstructive pulmonary disease) ([place])   . ESRD (end stage renal disease) on dialysis ([place])    a. HD MWF  . GERD (gastroesophageal reflux disease)   . Hyperlipidemia   . Hypertension   . Ischemic cardiomyopathy    a. TTE 05/2017: EF 35-40%, unable to exlcude RWMA, mild MR, mildly dilated RV  . Prostate cancer ([place])   . Renal insufficiency   . Shortness of breath dyspnea      Past Surgical History: Past  Surgical History:  Procedure Laterality Date  . A/V SHUNT INTERVENTION Left 10/24/2017   Procedure: A/V SHUNT INTERVENTION;  Surgeon: Algernon Huxley, MD;  Location: [place] [facility];  Service: Cardiovascular;  Laterality: Left;  . CARDIOVERSION N/A 07/26/2017   Procedure: Cardioversion;  Surgeon: Corey Skains, MD;  Location: [facility];  Service: Cardiovascular;  Laterality: N/A;  . CARDIOVERSION N/A 08/18/2017   Procedure: CARDIOVERSION;  Surgeon: Corey Skains, MD;  Location: [facility];  Service: Cardiovascular;  Laterality: N/A;  . CARDIOVERSION N/A 10/21/2017   Procedure: CARDIOVERSION;  Surgeon: Wellington Hampshire, MD;  Location: [facility];  Service: Cardiovascular;  Laterality: N/A;  . CARDIOVERSION N/A 11/25/2017   Procedure: CARDIOVERSION;  Surgeon: Wellington Hampshire, MD;  Location: [facility];  Service: Cardiovascular;  Laterality: N/A;  . CATARACT EXTRACTION W/PHACO Right 08/20/2015   Procedure: CATARACT EXTRACTION PHACO AND INTRAOCULAR LENS PLACEMENT ([place]);  Surgeon: Leandrew Koyanagi, MD;  Location: [place];  Service: Ophthalmology;  Laterality: Right;  . CATARACT EXTRACTION W/PHACO Left 09/10/2015   Procedure: CATARACT EXTRACTION PHACO AND INTRAOCULAR LENS PLACEMENT ([facility]);  Surgeon: Leandrew Koyanagi, MD;  Location: [place];  Service: Ophthalmology;  Laterality: Left;  . CORONARY STENT PLACEMENT    . 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 PERIPHERAL VASCULAR  CATHETERIZATION Left 05/20/2015   Procedure: A/V Shuntogram/Fistulagram;  Surgeon: Katha Cabal, MD;  Location: [place] [facility];  Service: Cardiovascular;  Laterality: Left;  . PERIPHERAL VASCULAR CATHETERIZATION N/A 05/20/2015   Procedure: A/V Shunt Intervention;  Surgeon: Katha Cabal, MD;  Location: [place] [facility];  Service: Cardiovascular;  Laterality: N/A;  . PERIPHERAL VASCULAR CATHETERIZATION Left 08/05/2015   Procedure: A/V Shuntogram/Fistulagram;  Surgeon:  Katha Cabal, MD;  Location: [place] [facility];  Service: Cardiovascular;  Laterality: Left;  . PERIPHERAL VASCULAR CATHETERIZATION N/A 08/05/2015   Procedure: A/V Shunt Intervention;  Surgeon: Katha Cabal, MD;  Location: [facility] [facility];  Service: Cardiovascular;  Laterality: N/A;  . PERIPHERAL VASCULAR CATHETERIZATION Left 01/06/2016   Procedure: A/V Shuntogram/Fistulagram;  Surgeon: Katha Cabal, MD;  Location: [place] [facility];  Service: Cardiovascular;  Laterality: Left;  . PERIPHERAL VASCULAR CATHETERIZATION N/A 01/06/2016   Procedure: A/V Shunt Intervention;  Surgeon: Katha Cabal, MD;  Location: [place] [facility];  Service: Cardiovascular;  Laterality: N/A;  . PERIPHERAL VASCULAR CATHETERIZATION N/A 12/13/2016   Procedure: Dialysis/Perma Catheter Removal;  Surgeon: Algernon Huxley, MD;  Location: [facility] [facility];  Service: Cardiovascular;  Laterality: N/A;  . PERIPHERAL VASCULAR THROMBECTOMY Left 01/02/2018   Procedure: PERIPHERAL VASCULAR THROMBECTOMY;  Surgeon: Algernon Huxley, MD;  Location: [facility];  Service: Cardiovascular;  Laterality: Left;    Home Medications: Prior to Admission medications   Medication Sig Start Date End Date Taking? Authorizing Provider  acetaminophen (TYLENOL) 500 MG tablet Take 1,000 mg by mouth daily as needed for moderate pain or headache.   Yes [provider]  albuterol (PROVENTIL HFA;VENTOLIN HFA) 108 (90 BASE) MCG/ACT inhaler Inhale 2 puffs into the lungs every 4 (four) hours as needed for wheezing or shortness of breath.   Yes [provider]  amiodarone (PACERONE) 200 MG tablet Take 1 tablet (200 mg) by mouth once daily 12/29/17  Yes Deboraha Sprang, MD  apixaban (ELIQUIS) 5 MG TABS tablet Take 1 tablet (5 mg total) by mouth 2 (two) times daily. 10/26/17  Yes Fritzi Mandes, MD  atorvastatin (LIPITOR) 10 MG tablet Take 10 mg by mouth every evening.    Yes [provider]  azelastine  (OPTIVAR) 0.05 % ophthalmic solution Place 1 drop into both eyes 2 (two) times daily as needed (allergies).   Yes [provider]  calcium acetate (PHOSLO) 667 MG capsule Take 1,334 mg by mouth 3 (three) times daily with meals.   Yes [provider]  dextromethorphan-guaiFENesin (MUCINEX DM) 30-600 MG 12hr tablet Take 1 tablet by mouth at bedtime as needed for cough.   Yes [provider]  DiphenhydrAMINE HCl, Sleep, (ZZZQUIL PO) Take 25 mg by mouth at bedtime as needed (sleep).    Yes [provider]  ethyl chloride spray APPLY A SMALL AMOUNT ON SKIN BEFORE NEEDLE INSERTION THREE TIMES PER WEEK 12/29/17  Yes [provider]  febuxostat (ULORIC) 40 MG tablet Take 40 mg by mouth every evening.    Yes [provider]  levothyroxine (SYNTHROID) 50 MCG tablet Take 1 tablet (50 mcg total) by mouth daily before breakfast. 01/05/18  Yes Deboraha Sprang, MD  metoprolol succinate (TOPROL-XL) 25 MG 24 hr tablet Take 0.5 mg (12.5 mg) by mouth once daily - ON HOLD ( starting 12/29/17) 12/29/17  Yes Deboraha Sprang, MD  midodrine (PROAMATINE) 10 MG tablet Take 10 mg by mouth 3 (three) times daily.  Yes [provider]  montelukast (SINGULAIR) 10 MG tablet Take 10 mg by mouth every evening.    Yes [provider]  multivitamin (RENA-VIT) TABS tablet Take 1 tablet by mouth daily.   Yes [provider]  pantoprazole (PROTONIX) 40 MG tablet Take 40 mg by mouth daily before breakfast.    Yes [provider]  umeclidinium-vilanterol (ANORO ELLIPTA) 62.5-25 MCG/INH AEPB Inhale 1 puff into the lungs daily.   Yes [provider]    Allergies: Allergies  Allergen Reactions  . Nsaids Nausea And Vomiting    Anti-inflammatories.  . Amoxicillin-Pot Clavulanate Nausea And Vomiting    Has patient had a PCN reaction causing immediate rash, facial/tongue/throat swelling, SOB or lightheadedness with hypotension: No Has patient had a  PCN reaction causing severe rash involving mucus membranes or skin necrosis: No Has patient had a PCN reaction that required hospitalization:Yes-vomiting blood Has patient had a PCN reaction occurring within the last 10 years: Yes If all of the above answers are "NO", then may proceed with Cephalosporin use.     Review of Systems: Review of Systems  Constitutional: Positive for malaise/fatigue. Negative for chills and fever.  Respiratory: Negative for shortness of breath.   Cardiovascular: Negative for chest pain.  Gastrointestinal: Negative for abdominal pain, constipation, diarrhea, nausea and vomiting.  Genitourinary: Negative for dysuria and hematuria.       Positive for pneumaturia  Neurological: Positive for weakness.    Physical Exam BP 95/66   Pulse (!) 125   Temp 97.7 F (36.5 C) (Oral)   Ht 5\' 11"  (1.803 m)   Wt 109.2 kg (240 lb 12.8 oz)   BMI 33.58 kg/m  CONSTITUTIONAL: No acute distress RESPIRATORY:  Lungs are clear, and breath sounds are equal bilaterally. Normal respiratory effort without pathologic use of accessory muscles. CARDIOVASCULAR: Heart is regular without murmurs, gallops, or rubs. GI: The abdomen is soft, obese, non-distended, non-tender to palpation.  There is no discomfort at all over the low abdomen or pelvis.  EXTREMITIES:  Left upper extremity AV fistula.  NEUROLOGIC:  Motor and sensation is grossly normal.  Cranial nerves are grossly intact. PSYCH:  Alert and oriented to person, place and time. Affect is normal.  Assessment and Plan: This is a 77 y.o. male who presents with colovesical fistula.  I had discussed with Dr. Erlene Quan last about this patient and his colovesical fistula.  There was concern during the cystoscopy for tumor vs fistulous area.  She would like to see him again for repeat cystoscopy and possible biopsy.  Will set up appointment for him with her.  From the surgical standpoint, given that his fistula has not resolved, discussed  with the patient that given his comorbidities, it may be worthwhile referring to a tertiary center for further evaluation and possible surgical management.  Other than the pneumaturia, he is asymptomatic, but I doubt this will close on its own.  Definitely is worthwhile doing cystoscopy with biopsy and may also need colonoscopy as well.  Will defer to referral for further testing.  The patient understands this plan and all of his questions have been answered.  Face-to-face time spent with the patient and care providers was 25 minutes, with more than 50% of the time spent counseling, educating, and coordinating care of the patient.     Melvyn Neth, [place]

## 2018-01-25 NOTE — Patient Instructions (Addendum)
Your appointment with Dr. Erlene Quan is 02/03/18 @ 11:00 am.   We  will send the referral to [facility]  surgery. Please allow 5-7 days for someone to contact you from their office.  If you have not heard from anyone by next Friday call our office so we can check on this for you.   Please call our office if you have questions or concerns.

## 2018-01-26 ENCOUNTER — Ambulatory Visit: Payer: Medicare Other | Admitting: Urology

## 2018-01-27 ENCOUNTER — Telehealth: Payer: Self-pay | Admitting: Surgery

## 2018-01-27 NOTE — Telephone Encounter (Signed)
I have sent a referral via fax to [facility] Colorectal, per Dr Mont Dutton request for colovesicular fistula.  I spoke with Lars Mage at [facility] [phone]) and she stated that she would be calling the patient to make an appointment hopefully by the end of the day, if not, by Monday.   I have advised patient of the status of the referral process and I advised him that I will follow up on Monday to make sure his appointment was made.

## 2018-01-31 ENCOUNTER — Telehealth: Payer: Self-pay | Admitting: Urology

## 2018-01-31 NOTE — Telephone Encounter (Signed)
Just F.Y.I. Pt called to cancel appt this Friday to discuss surgery with you.  He said he will be admitted to [facility] on Monday and they should take care of his problem there.

## 2018-02-01 NOTE — Telephone Encounter (Signed)
Appointment has been made with [facility] GI colorectal department.   Appointment: 02/27/18 @ 8:30am with Dr Sharol Roussel.   Patient is aware of the appointment date/time and location.

## 2018-02-03 ENCOUNTER — Ambulatory Visit: Payer: Medicare Other | Admitting: Urology

## 2018-02-10 MED ORDER — ACETAMINOPHEN 325 MG PO TABS
650.00 mg | ORAL_TABLET | ORAL | Status: DC
Start: ? — End: 2018-02-10

## 2018-02-10 MED ORDER — METRONIDAZOLE 500 MG PO TABS
500.00 mg | ORAL_TABLET | ORAL | Status: DC
Start: 2018-02-09 — End: 2018-02-10

## 2018-02-10 MED ORDER — AMIODARONE HCL 200 MG PO TABS
200.00 mg | ORAL_TABLET | ORAL | Status: DC
Start: 2018-02-10 — End: 2018-02-10

## 2018-02-10 MED ORDER — ATORVASTATIN CALCIUM 40 MG PO TABS
40.00 mg | ORAL_TABLET | ORAL | Status: DC
Start: 2018-02-09 — End: 2018-02-10

## 2018-02-10 MED ORDER — GENERIC EXTERNAL MEDICATION
2.00 | Status: DC
Start: ? — End: 2018-02-10

## 2018-02-10 MED ORDER — SENNOSIDES 8.6 MG PO TABS
2.00 | ORAL_TABLET | ORAL | Status: DC
Start: ? — End: 2018-02-10

## 2018-02-10 MED ORDER — PANTOPRAZOLE SODIUM 40 MG PO TBEC
40.00 mg | DELAYED_RELEASE_TABLET | ORAL | Status: DC
Start: 2018-02-10 — End: 2018-02-10

## 2018-02-10 MED ORDER — LEVOTHYROXINE SODIUM 50 MCG PO TABS
50.00 | ORAL_TABLET | ORAL | Status: DC
Start: 2018-02-10 — End: 2018-02-10

## 2018-02-10 MED ORDER — HEPARIN SODIUM (PORCINE) 1000 UNIT/ML IJ SOLN
1000.00 | INTRAMUSCULAR | Status: DC
Start: ? — End: 2018-02-10

## 2018-02-10 MED ORDER — GENERIC EXTERNAL MEDICATION
2.00 | Status: DC
Start: 2018-02-08 — End: 2018-02-10

## 2018-02-10 MED ORDER — MONTELUKAST SODIUM 10 MG PO TABS
10.00 mg | ORAL_TABLET | ORAL | Status: DC
Start: 2018-02-10 — End: 2018-02-10

## 2018-02-10 MED ORDER — TIOTROPIUM BROMIDE MONOHYDRATE 18 MCG IN CAPS
18.00 | ORAL_CAPSULE | RESPIRATORY_TRACT | Status: DC
Start: 2018-02-10 — End: 2018-02-10

## 2018-02-10 MED ORDER — ARFORMOTEROL TARTRATE 15 MCG/2ML IN NEBU
15.00 | INHALATION_SOLUTION | RESPIRATORY_TRACT | Status: DC
Start: 2018-02-09 — End: 2018-02-10

## 2018-02-10 MED ORDER — ONDANSETRON 4 MG PO TBDP
4.00 mg | ORAL_TABLET | ORAL | Status: DC
Start: ? — End: 2018-02-10

## 2018-02-10 MED ORDER — CALCIUM ACETATE (PHOS BINDER) 667 MG PO CAPS
1334.00 mg | ORAL_CAPSULE | ORAL | Status: DC
Start: 2018-02-09 — End: 2018-02-10

## 2018-02-10 MED ORDER — MIDODRINE HCL 10 MG PO TABS
10.00 mg | ORAL_TABLET | ORAL | Status: DC
Start: 2018-02-09 — End: 2018-02-10

## 2018-02-14 ENCOUNTER — Other Ambulatory Visit
Admission: RE | Admit: 2018-02-14 | Discharge: 2018-02-14 | Disposition: A | Discharge status: Home / Self Care | Payer: Medicare Other | Source: Ambulatory Visit | Attending: Internal Medicine | Admitting: Internal Medicine

## 2018-02-14 DIAGNOSIS — I480 Paroxysmal atrial fibrillation: Secondary | ICD-10-CM | POA: Diagnosis present

## 2018-02-14 DIAGNOSIS — R7989 Other specified abnormal findings of blood chemistry: Secondary | ICD-10-CM | POA: Insufficient documentation

## 2018-02-14 DIAGNOSIS — I484 Atypical atrial flutter: Secondary | ICD-10-CM | POA: Insufficient documentation

## 2018-02-14 LAB — TSH: TSH: 6.748 u[IU]/mL — ABNORMAL HIGH (ref 0.350–4.500)

## 2018-02-20 ENCOUNTER — Inpatient Hospital Stay
Admission: EM | Admit: 2018-02-20 | Discharge: 2018-02-25 | DRG: 871 | Disposition: A | Discharge status: Home / Self Care | Payer: Medicare Other | Attending: Internal Medicine | Admitting: Internal Medicine

## 2018-02-20 ENCOUNTER — Emergency Department: Payer: Medicare Other

## 2018-02-20 ENCOUNTER — Encounter: Payer: Self-pay | Admitting: Emergency Medicine

## 2018-02-20 ENCOUNTER — Other Ambulatory Visit: Payer: Self-pay

## 2018-02-20 DIAGNOSIS — I132 Hypertensive heart and chronic kidney disease with heart failure and with stage 5 chronic kidney disease, or end stage renal disease: Secondary | ICD-10-CM | POA: Diagnosis present

## 2018-02-20 DIAGNOSIS — K219 Gastro-esophageal reflux disease without esophagitis: Secondary | ICD-10-CM | POA: Diagnosis present

## 2018-02-20 DIAGNOSIS — I776 Arteritis, unspecified: Secondary | ICD-10-CM | POA: Diagnosis present

## 2018-02-20 DIAGNOSIS — K651 Peritoneal abscess: Secondary | ICD-10-CM | POA: Diagnosis present

## 2018-02-20 DIAGNOSIS — N321 Vesicointestinal fistula: Secondary | ICD-10-CM | POA: Diagnosis not present

## 2018-02-20 DIAGNOSIS — G9341 Metabolic encephalopathy: Secondary | ICD-10-CM | POA: Diagnosis present

## 2018-02-20 DIAGNOSIS — M1A9XX0 Chronic gout, unspecified, without tophus (tophi): Secondary | ICD-10-CM | POA: Diagnosis present

## 2018-02-20 DIAGNOSIS — Z992 Dependence on renal dialysis: Secondary | ICD-10-CM

## 2018-02-20 DIAGNOSIS — I5022 Chronic systolic (congestive) heart failure: Secondary | ICD-10-CM | POA: Diagnosis present

## 2018-02-20 DIAGNOSIS — I4892 Unspecified atrial flutter: Secondary | ICD-10-CM | POA: Diagnosis present

## 2018-02-20 DIAGNOSIS — E860 Dehydration: Secondary | ICD-10-CM | POA: Diagnosis present

## 2018-02-20 DIAGNOSIS — K5720 Diverticulitis of large intestine with perforation and abscess without bleeding: Secondary | ICD-10-CM | POA: Diagnosis present

## 2018-02-20 DIAGNOSIS — Z87891 Personal history of nicotine dependence: Secondary | ICD-10-CM

## 2018-02-20 DIAGNOSIS — I252 Old myocardial infarction: Secondary | ICD-10-CM

## 2018-02-20 DIAGNOSIS — E875 Hyperkalemia: Secondary | ICD-10-CM | POA: Diagnosis present

## 2018-02-20 DIAGNOSIS — Z955 Presence of coronary angioplasty implant and graft: Secondary | ICD-10-CM

## 2018-02-20 DIAGNOSIS — N2581 Secondary hyperparathyroidism of renal origin: Secondary | ICD-10-CM | POA: Diagnosis present

## 2018-02-20 DIAGNOSIS — I255 Ischemic cardiomyopathy: Secondary | ICD-10-CM | POA: Diagnosis present

## 2018-02-20 DIAGNOSIS — N186 End stage renal disease: Secondary | ICD-10-CM | POA: Diagnosis present

## 2018-02-20 DIAGNOSIS — M1990 Unspecified osteoarthritis, unspecified site: Secondary | ICD-10-CM | POA: Diagnosis present

## 2018-02-20 DIAGNOSIS — Z881 Allergy status to other antibiotic agents status: Secondary | ICD-10-CM

## 2018-02-20 DIAGNOSIS — I4891 Unspecified atrial fibrillation: Secondary | ICD-10-CM | POA: Diagnosis present

## 2018-02-20 DIAGNOSIS — E785 Hyperlipidemia, unspecified: Secondary | ICD-10-CM | POA: Diagnosis present

## 2018-02-20 DIAGNOSIS — Z79899 Other long term (current) drug therapy: Secondary | ICD-10-CM

## 2018-02-20 DIAGNOSIS — I9589 Other hypotension: Secondary | ICD-10-CM | POA: Diagnosis present

## 2018-02-20 DIAGNOSIS — D631 Anemia in chronic kidney disease: Secondary | ICD-10-CM | POA: Diagnosis present

## 2018-02-20 DIAGNOSIS — R197 Diarrhea, unspecified: Secondary | ICD-10-CM | POA: Diagnosis present

## 2018-02-20 DIAGNOSIS — A419 Sepsis, unspecified organism: Principal | ICD-10-CM | POA: Diagnosis present

## 2018-02-20 DIAGNOSIS — Z7901 Long term (current) use of anticoagulants: Secondary | ICD-10-CM

## 2018-02-20 DIAGNOSIS — I2510 Atherosclerotic heart disease of native coronary artery without angina pectoris: Secondary | ICD-10-CM | POA: Diagnosis present

## 2018-02-20 DIAGNOSIS — Z8546 Personal history of malignant neoplasm of prostate: Secondary | ICD-10-CM

## 2018-02-20 DIAGNOSIS — J449 Chronic obstructive pulmonary disease, unspecified: Secondary | ICD-10-CM | POA: Diagnosis present

## 2018-02-20 DIAGNOSIS — Z886 Allergy status to analgesic agent status: Secondary | ICD-10-CM

## 2018-02-20 DIAGNOSIS — Z85528 Personal history of other malignant neoplasm of kidney: Secondary | ICD-10-CM

## 2018-02-20 LAB — CBC WITH DIFFERENTIAL/PLATELET
BASOS PCT: 1 %
Basophils Absolute: 0.2 10*3/uL — ABNORMAL HIGH (ref 0–0.1)
Eosinophils Absolute: 0.1 10*3/uL (ref 0–0.7)
Eosinophils Relative: 1 %
HCT: 35.4 % — ABNORMAL LOW (ref 40.0–52.0)
HEMOGLOBIN: 11.2 g/dL — AB (ref 13.0–18.0)
Lymphocytes Relative: 10 %
Lymphs Abs: 1.2 10*3/uL (ref 1.0–3.6)
MCH: 29.3 pg (ref 26.0–34.0)
MCHC: 31.7 g/dL — AB (ref 32.0–36.0)
MCV: 92.5 fL (ref 80.0–100.0)
Monocytes Absolute: 0.8 10*3/uL (ref 0.2–1.0)
Monocytes Relative: 7 %
NEUTROS PCT: 81 %
Neutro Abs: 10.3 10*3/uL — ABNORMAL HIGH (ref 1.4–6.5)
PLATELETS: 232 10*3/uL (ref 150–440)
RBC: 3.83 MIL/uL — AB (ref 4.40–5.90)
RDW: 18.3 % — ABNORMAL HIGH (ref 11.5–14.5)
WBC: 12.6 10*3/uL — AB (ref 3.8–10.6)

## 2018-02-20 LAB — LACTIC ACID, PLASMA: LACTIC ACID, VENOUS: 1.6 mmol/L (ref 0.5–1.9)

## 2018-02-20 LAB — COMPREHENSIVE METABOLIC PANEL
ALBUMIN: 2.8 g/dL — AB (ref 3.5–5.0)
ALK PHOS: 66 U/L (ref 38–126)
ALT: 22 U/L (ref 17–63)
ANION GAP: 15 (ref 5–15)
AST: 32 U/L (ref 15–41)
BUN: 42 mg/dL — ABNORMAL HIGH (ref 6–20)
CALCIUM: 8.4 mg/dL — AB (ref 8.9–10.3)
CHLORIDE: 95 mmol/L — AB (ref 101–111)
CO2: 23 mmol/L (ref 22–32)
Creatinine, Ser: 7.84 mg/dL — ABNORMAL HIGH (ref 0.61–1.24)
GFR calc Af Amer: 7 mL/min — ABNORMAL LOW (ref >60)
GFR calc non Af Amer: 6 mL/min — ABNORMAL LOW (ref >60)
GLUCOSE: 125 mg/dL — AB (ref 65–99)
Potassium: 5.4 mmol/L — ABNORMAL HIGH (ref 3.5–5.1)
SODIUM: 133 mmol/L — AB (ref 135–145)
Total Bilirubin: 1 mg/dL (ref 0.3–1.2)
Total Protein: 7.2 g/dL (ref 6.5–8.1)

## 2018-02-20 LAB — APTT: APTT: 39 s — AB (ref 24–36)

## 2018-02-20 LAB — PROTIME-INR
INR: 2
PROTHROMBIN TIME: 22.5 s — AB (ref 11.4–15.2)

## 2018-02-20 LAB — MAGNESIUM: Magnesium: 2.3 mg/dL (ref 1.7–2.4)

## 2018-02-20 LAB — TROPONIN I
TROPONIN I: 0.03 ng/mL — AB (ref <0.03)
Troponin I: 0.03 ng/mL (ref <0.03)

## 2018-02-20 LAB — MRSA PCR SCREENING: MRSA BY PCR: NEGATIVE

## 2018-02-20 MED ORDER — PIPERACILLIN-TAZOBACTAM 3.375 G IVPB 30 MIN
3.3750 g | Freq: Once | INTRAVENOUS | Status: AC
  Administered 2018-02-20: 3.375 g via INTRAVENOUS
  Filled 2018-02-20: qty 50

## 2018-02-20 MED ORDER — CIPROFLOXACIN IN D5W 400 MG/200ML IV SOLN
400.0000 mg | INTRAVENOUS | Status: DC
Start: 2018-02-20 — End: 2018-02-21
  Administered 2018-02-20: 400 mg via INTRAVENOUS
  Filled 2018-02-20 (×2): qty 200

## 2018-02-20 MED ORDER — ACETAMINOPHEN 500 MG PO TABS
1000.0000 mg | ORAL_TABLET | Freq: Every day | ORAL | Status: DC | PRN
  Administered 2018-02-20 – 2018-02-22 (×3): 1000 mg via ORAL
  Filled 2018-02-20 (×3): qty 2

## 2018-02-20 MED ORDER — PATIROMER SORBITEX CALCIUM 8.4 G PO PACK
8.4000 g | PACK | Freq: Every day | ORAL | Status: AC
  Administered 2018-02-20 – 2018-02-21 (×2): 8.4 g via ORAL
  Filled 2018-02-20 (×3): qty 4

## 2018-02-20 MED ORDER — HEPARIN SODIUM (PORCINE) 5000 UNIT/ML IJ SOLN
5000.0000 [IU] | Freq: Three times a day (TID) | INTRAMUSCULAR | Status: DC
  Administered 2018-02-20 – 2018-02-23 (×2): 5000 [IU] via SUBCUTANEOUS
  Filled 2018-02-20 (×5): qty 1

## 2018-02-20 MED ORDER — FEBUXOSTAT 40 MG PO TABS
40.0000 mg | ORAL_TABLET | Freq: Every evening | ORAL | Status: DC
  Administered 2018-02-20 – 2018-02-24 (×5): 40 mg via ORAL
  Filled 2018-02-20 (×6): qty 1

## 2018-02-20 MED ORDER — DEXTROSE-NACL 5-0.45 % IV SOLN: INTRAVENOUS | Status: DC

## 2018-02-20 MED ORDER — CEFTAZIDIME SODIUM IN D5W 2-5 GM/50ML-% IV SOLN
2.0000 g | INTRAVENOUS | Status: DC
  Administered 2018-02-20: 2 g via INTRAVENOUS
  Filled 2018-02-20 (×4): qty 50

## 2018-02-20 MED ORDER — ONDANSETRON HCL 4 MG/2ML IJ SOLN: 4.0000 mg | Freq: Four times a day (QID) | INTRAMUSCULAR | Status: DC | PRN

## 2018-02-20 MED ORDER — ONDANSETRON HCL 4 MG PO TABS
4.0000 mg | ORAL_TABLET | Freq: Four times a day (QID) | ORAL | Status: DC | PRN
Start: 2018-02-20 — End: 2018-02-25

## 2018-02-20 MED ORDER — RENA-VITE PO TABS
1.0000 | ORAL_TABLET | Freq: Every day | ORAL | Status: DC
  Administered 2018-02-20 – 2018-02-25 (×6): 1 via ORAL
  Filled 2018-02-20 (×6): qty 1

## 2018-02-20 MED ORDER — LEVOTHYROXINE SODIUM 50 MCG PO TABS
50.0000 ug | ORAL_TABLET | Freq: Every day | ORAL | Status: DC
  Administered 2018-02-21 – 2018-02-25 (×5): 50 ug via ORAL
  Filled 2018-02-20 (×6): qty 1

## 2018-02-20 MED ORDER — SODIUM CHLORIDE 0.9 % IV BOLUS (SEPSIS)
250.0000 mL | Freq: Once | INTRAVENOUS | Status: AC
  Administered 2018-02-20: 250 mL via INTRAVENOUS

## 2018-02-20 MED ORDER — IOPAMIDOL (ISOVUE-300) INJECTION 61%
100.0000 mL | Freq: Once | INTRAVENOUS | Status: AC | PRN
  Administered 2018-02-20: 100 mL via INTRAVENOUS

## 2018-02-20 MED ORDER — PANTOPRAZOLE SODIUM 40 MG PO TBEC
40.0000 mg | DELAYED_RELEASE_TABLET | Freq: Every day | ORAL | Status: DC
  Administered 2018-02-21 – 2018-02-25 (×5): 40 mg via ORAL
  Filled 2018-02-20 (×6): qty 1

## 2018-02-20 MED ORDER — ALBUTEROL SULFATE HFA 108 (90 BASE) MCG/ACT IN AERS: 2.0000 | INHALATION_SPRAY | RESPIRATORY_TRACT | Status: DC | PRN

## 2018-02-20 MED ORDER — ATORVASTATIN CALCIUM 10 MG PO TABS
10.0000 mg | ORAL_TABLET | Freq: Every evening | ORAL | Status: DC
  Administered 2018-02-20 – 2018-02-24 (×5): 10 mg via ORAL
  Filled 2018-02-20 (×5): qty 1

## 2018-02-20 MED ORDER — NYSTATIN 100000 UNIT/GM EX POWD
Freq: Two times a day (BID) | CUTANEOUS | Status: DC
  Administered 2018-02-20 – 2018-02-25 (×9): via TOPICAL
  Filled 2018-02-20: qty 15

## 2018-02-20 MED ORDER — MONTELUKAST SODIUM 10 MG PO TABS
10.0000 mg | ORAL_TABLET | Freq: Every evening | ORAL | Status: DC
  Administered 2018-02-20 – 2018-02-24 (×5): 10 mg via ORAL
  Filled 2018-02-20 (×5): qty 1

## 2018-02-20 MED ORDER — DIPHENHYDRAMINE HCL 25 MG PO CAPS
25.0000 mg | ORAL_CAPSULE | Freq: Every evening | ORAL | Status: DC | PRN
  Administered 2018-02-20 – 2018-02-22 (×2): 25 mg via ORAL
  Filled 2018-02-20 (×2): qty 1

## 2018-02-20 MED ORDER — DM-GUAIFENESIN ER 30-600 MG PO TB12: 1.0000 | ORAL_TABLET | Freq: Every evening | ORAL | Status: DC | PRN

## 2018-02-20 MED ORDER — DEXTROMETHORPHAN POLISTIREX ER 30 MG/5ML PO SUER
30.0000 mg | Freq: Every evening | ORAL | Status: DC | PRN
  Administered 2018-02-24: 30 mg via ORAL
  Filled 2018-02-20 (×2): qty 5

## 2018-02-20 MED ORDER — KETOTIFEN FUMARATE 0.025 % OP SOLN
1.0000 [drp] | Freq: Two times a day (BID) | OPHTHALMIC | Status: DC | PRN
  Filled 2018-02-20: qty 5

## 2018-02-20 MED ORDER — HYDROCODONE-ACETAMINOPHEN 5-325 MG PO TABS
1.0000 | ORAL_TABLET | ORAL | Status: DC | PRN
  Administered 2018-02-23 – 2018-02-24 (×5): 2 via ORAL
  Filled 2018-02-20 (×5): qty 2

## 2018-02-20 MED ORDER — PENTAFLUOROPROP-TETRAFLUOROETH EX AERO
INHALATION_SPRAY | CUTANEOUS | Status: DC | PRN
  Filled 2018-02-20: qty 103.5

## 2018-02-20 MED ORDER — AMIODARONE HCL 200 MG PO TABS
200.0000 mg | ORAL_TABLET | Freq: Every day | ORAL | Status: DC
  Administered 2018-02-20 – 2018-02-24 (×4): 200 mg via ORAL
  Filled 2018-02-20 (×5): qty 1

## 2018-02-20 MED ORDER — ALBUTEROL SULFATE (2.5 MG/3ML) 0.083% IN NEBU
2.5000 mg | INHALATION_SOLUTION | Freq: Four times a day (QID) | RESPIRATORY_TRACT | Status: DC | PRN
  Administered 2018-02-24: 2.5 mg via RESPIRATORY_TRACT
  Filled 2018-02-20: qty 3

## 2018-02-20 MED ORDER — UMECLIDINIUM-VILANTEROL 62.5-25 MCG/INH IN AEPB
1.0000 | INHALATION_SPRAY | Freq: Every day | RESPIRATORY_TRACT | Status: DC
  Administered 2018-02-21 – 2018-02-25 (×5): 1 via RESPIRATORY_TRACT
  Filled 2018-02-20: qty 14

## 2018-02-20 MED ORDER — GUAIFENESIN ER 600 MG PO TB12
600.0000 mg | ORAL_TABLET | Freq: Every evening | ORAL | Status: DC | PRN
Start: 2018-02-20 — End: 2018-02-22

## 2018-02-20 MED ORDER — MIDODRINE HCL 5 MG PO TABS
10.0000 mg | ORAL_TABLET | Freq: Three times a day (TID) | ORAL | Status: DC
  Administered 2018-02-20 – 2018-02-25 (×14): 10 mg via ORAL
  Filled 2018-02-20 (×16): qty 2

## 2018-02-20 MED ORDER — CEFTAZIDIME 2 G IJ SOLR
2.0000 g | INTRAMUSCULAR | Status: DC
  Filled 2018-02-20: qty 2

## 2018-02-20 MED ORDER — CALCIUM ACETATE (PHOS BINDER) 667 MG PO CAPS
1334.0000 mg | ORAL_CAPSULE | Freq: Three times a day (TID) | ORAL | Status: DC
  Administered 2018-02-20 – 2018-02-25 (×8): 1334 mg via ORAL
  Filled 2018-02-20 (×11): qty 2

## 2018-02-20 MED ORDER — IOPAMIDOL (ISOVUE-300) INJECTION 61%
30.0000 mL | Freq: Once | INTRAVENOUS | Status: AC | PRN
  Administered 2018-02-20: 30 mL via ORAL

## 2018-02-20 MED ORDER — SODIUM CHLORIDE 0.9 % IV SOLN
1.0000 g | Freq: Once | INTRAVENOUS | Status: AC
  Administered 2018-02-20: 1 g via INTRAVENOUS
  Filled 2018-02-20: qty 10

## 2018-02-20 MED ORDER — VANCOMYCIN HCL 10 G IV SOLR
2000.0000 mg | Freq: Once | INTRAVENOUS | Status: AC
  Administered 2018-02-20: 2000 mg via INTRAVENOUS
  Filled 2018-02-20: qty 2000

## 2018-02-20 NOTE — ED Notes (Signed)
Pt ambulatory to commode. Pt extreme SHOB pt placed on 1 L at this time O2 is 89%. EDP made aware.

## 2018-02-20 NOTE — ED Triage Notes (Signed)
Pt arrived via EMS from dialysis; pt gets dialysis on M-W-F; staff at dialysis registered a blood pressure of 76/48; EMS reports a blood pressure of 124/96; pt short of breath with exertion but says this is his normal; pt denies any pain; talking in complete coherent sentences; sats 100% on room air;

## 2018-02-20 NOTE — Progress Notes (Signed)
[facility]  ROUNDING NOTE   Subjective:   [name] admitted to [facility] on 02/20/2018 for trouble breathing  Patient's last hemodialysis was Friday.   Complains of diarrhea. Placed on C. Diff rule out.   Patient underwent intrabdominal abscess requiring IV antibiotics: IV vancomycin and ceftazidime along with PO metronidazole until 3/19.   Objective:  Vital signs in last 24 hours:  Temp:  [97.7 F (36.5 C)-98.3 F (36.8 C)] 97.7 F (36.5 C) (02/25 1540) Pulse Rate:  [102-115] 105 (02/25 1540) Resp:  [18-46] 20 (02/25 1540) BP: (77-113)/(53-86) 90/62 (02/25 1540) SpO2:  [90 %-100 %] 90 % (02/25 1540) Weight:  [116.3 kg (256 lb 6.3 oz)] 116.3 kg (256 lb 6.3 oz) (02/25 0640)  Weight change:  Filed Weights   02/20/18 0640  Weight: 116.3 kg (256 lb 6.3 oz)    Intake/Output: No intake/output data recorded.   Intake/Output this shift:  Total I/O In: 1150 [IV Piggyback:1150] Out: -   Physical Exam: General: NAD,   Head: Normocephalic, atraumatic. Moist oral mucosal membranes  Eyes: Anicteric, PERRL  Neck: Supple, trachea midline  Lungs:  Clear to auscultation  Heart: Regular rate and rhythm  Abdomen:  Soft, nontender,   Extremities: + peripheral edema.  Neurologic: Nonfocal, moving all four extremities  Skin: +erythema petechial lesions       Basic Metabolic Panel: Recent Labs  Lab 02/20/18 0449  NA 133*  K 5.4*  CL 95*  CO2 23  GLUCOSE 125*  BUN 42*  CREATININE 7.84*  CALCIUM 8.4*  MG 2.3    Liver Function Tests: Recent Labs  Lab 02/20/18 0449  AST 32  ALT 22  ALKPHOS 66  BILITOT 1.0  PROT 7.2  ALBUMIN 2.8*   No results for input(s): LIPASE, AMYLASE in the last 168 hours. No results for input(s): AMMONIA in the last 168 hours.  CBC: Recent Labs  Lab 02/20/18 0449  WBC 12.6*  NEUTROABS 10.3*  HGB 11.2*  HCT 35.4*  MCV 92.5  PLT 232    Cardiac Enzymes: Recent Labs  Lab 02/20/18 0449 02/20/18 1146  TROPONINI  0.03* 0.03*    BNP: Invalid input(s): POCBNP  CBG: No results for input(s): GLUCAP in the last 168 hours.  Microbiology: Results for orders placed or performed during the hospital encounter of 12/18/17  Urine culture     Status: Abnormal   Collection Time: 12/18/17  5:49 AM  Result Value Ref Range Status   Specimen Description   Final    URINE, RANDOM Performed at [facility], [address] [place], [place] [id number]    Special Requests   Final    NONE Performed at [facility], [address] [place], [place] [address]    Culture MULTIPLE SPECIES PRESENT, SUGGEST RECOLLECTION (A)  Final   Report Status 12/19/2017 FINAL  Final  Culture, blood (routine x 2)     Status: None   Collection Time: 12/18/17  7:28 AM  Result Value Ref Range Status   Specimen Description BLOOD RIGHT ASSIST CONTROL  Final   Special Requests   Final    BOTTLES DRAWN AEROBIC AND ANAEROBIC Blood Culture results may not be optimal due to an excessive volume of blood received in culture bottles   Culture   Final    NO GROWTH 5 DAYS Performed at [facility], [address], [place], [place] [address]    Report Status 12/23/2017 FINAL  Final  Culture, blood (routine x 2)  Status: None   Collection Time: 12/18/17  7:28 AM  Result Value Ref Range Status   Specimen Description BLOOD RIGHT HAND  Final   Special Requests   Final    BOTTLES DRAWN AEROBIC AND ANAEROBIC Blood Culture results may not be optimal due to an excessive volume of blood received in culture bottles   Culture   Final    NO GROWTH 5 DAYS Performed at [facility], [address], [place], [place] [address]    Report Status 12/23/2017 FINAL  Final    Coagulation Studies: No results for input(s): LABPROT, INR in the last 72 hours.  Urinalysis: No results for input(s): COLORURINE, LABSPEC, PHURINE, GLUCOSEU, HGBUR, BILIRUBINUR, KETONESUR, PROTEINUR, UROBILINOGEN, NITRITE, LEUKOCYTESUR in  the last 72 hours.  Invalid input(s): APPERANCEUR    Imaging: Ct Abdomen Pelvis W Contrast  Result Date: 02/20/2018 CLINICAL DATA:  Colovesical fistula. Sigmoid diverticulitis with abscess positioned between the sigmoid colon and bladder. Prior aspiration under CT guidance on 01/09/2018 EXAM: CT ABDOMEN AND PELVIS WITH CONTRAST TECHNIQUE: Multidetector CT imaging of the abdomen and pelvis was performed using the standard protocol following bolus administration of intravenous contrast. CONTRAST:  167mL ISOVUE-300 IOPAMIDOL (ISOVUE-300) INJECTION 61% COMPARISON:  CT 01/09/2018, 01/05/2018 FINDINGS: Lower chest: Small bilateral pleural effusions increased from prior. Hepatobiliary: No focal hepatic lesion. No biliary duct dilatation. Gallbladder is normal. Common bile duct is normal. Pancreas: Pancreas is normal. No ductal dilatation. No pancreatic inflammation. Spleen: Normal spleen Adrenals/urinary tract: Bilateral renal cortical thinning no hydronephrosis. Ureters normal. Small a gas within the bladder. Stomach/Bowel: Stomach, small-bowel, cecum normal. Appendix normal. Moderate volume stool throughout the colon. Several diverticula through the sigmoid colon. Rectum normal. Fluid collection positioned between the dome of the bladder and the sigmoid colon measures 5.2 by 4.2 by 4.0 cm (volume = 46 cm^3) compared with 3.1 x 2.8 by 2.8 cm (volume = 13 cm^3) on CT 01/05/2018. There is fluid and gas within this pericolonic collection. No significant inflammation associated with the sigmoid colon. No intraperitoneal free air.  No intraperitoneal free fluid. Vascular/Lymphatic: Abdominal aorta is normal caliber with atherosclerotic calcification. There is no retroperitoneal or periportal lymphadenopathy. No pelvic lymphadenopathy. Reproductive: Prostate normal Other: No free fluid. Musculoskeletal: No aggressive osseous lesion. IMPRESSION: 1. Interval increase in volume of the rounded fluid  collection/abscess/fistula track positioned between the sigmoid colon and the bladder. 2. Small amount of gas within the bladder. 3. New bilateral small effusions. Electronically Signed   By: Suzy Bouchard M.D.   On: 02/20/2018 11:15   Dg Chest Portable 1 View  Result Date: 02/20/2018 CLINICAL DATA:  Shortness of breath.  Dialysis EXAM: PORTABLE CHEST 1 VIEW COMPARISON:  10/19/2017 FINDINGS: Chronic cardiopericardial enlargement. Low volume chest with interstitial crowding. There is no edema, consolidation, effusion, or pneumothorax. IMPRESSION: 1. Low volume chest without acute finding. 2. Cardiomegaly. Electronically Signed   By: Monte Fantasia M.D.   On: 02/20/2018 07:17     Medications:   . ciprofloxacin     . amiodarone  200 mg Oral Daily  . atorvastatin  10 mg Oral QPM  . calcium acetate  1,334 mg Oral TID WC  . ceftAZIDime  2 g Intravenous Q M,W,F-1800  . febuxostat  40 mg Oral QPM  . heparin  5,000 Units Subcutaneous Q8H  . [START ON 02/21/2018] levothyroxine  50 mcg Oral QAC breakfast  . midodrine  10 mg Oral TID AC  . montelukast  10 mg Oral QPM  . multivitamin  1 tablet  Oral Daily  . nystatin   Topical BID  . [START ON 02/21/2018] pantoprazole  40 mg Oral QAC breakfast  . patiromer  8.4 g Oral Daily  . [START ON 02/21/2018] umeclidinium-vilanterol  1 puff Inhalation Daily   acetaminophen, albuterol, guaiFENesin **AND** dextromethorphan, diphenhydrAMINE, HYDROcodone-acetaminophen, ketotifen, ondansetron **OR** ondansetron (ZOFRAN) IV, pentafluoroprop-tetrafluoroeth  Assessment/ Plan:  [name] is a 77 y.o. 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  1.  End-stage renal disease: MWF.  Dialysis last on Friday. Plan  on hemodialysis tomorrow and then resume MWF schedule.   2.  Anemia of chronic kidney disease: hemoglobin 11.2 - [facility] as outpatient.   3.  Secondary hyperparathyroidism:  - calcium acetate with meals.   4. Diarrhea: with intraabdominal abscess on IV vancomycin, metronidazole, ceftazidime Concerning for colitis - Appreciate ID input.    LOS: 0 Dejan Angert 2/25/20196:05 PM

## 2018-02-20 NOTE — Progress Notes (Signed)
PHARMACY NOTE -  ANTIBIOTIC RENAL DOSE ADJUSTMENT   Patient has been initiated on ciprofloxacin 400 mg bid. ESRD on HD Will adjust dosing to 400 mg iv q 24 hours.  Please reconsult if a change in clinical status warrants re-evaluation of dosage.  Ulice Dash, PharmD Clinical Pharmacist

## 2018-02-20 NOTE — ED Notes (Signed)
Alma Friendly, EDT to transport pt to 2C[facility]205

## 2018-02-20 NOTE — Consult Note (Signed)
[facility] Infectious Disease     Reason for Consult: Intraabdominal abscess    Referring Physician: Clovis Fredrickson Date of Admission:  02/20/2018   Active Problems:   Diverticulitis of large intestine with abscess without bleeding   Colovesical fistula   HPI: Maurice Peterson is a 77 y.o. male admitted from HD with hypotension. He has a complicated hx of intraabdominal abscess, with colovesicular fistula evaluated at [facility].  He was currently being treated with IV vanco and ceftazidime at HD and oral flagyl after 10 day stay at [facility] 2/5-2/14.  Stop date was top be 3/19 when he would be followed up at [facility].  He has a complicated hx including IGA vasculitis, on HD for 4 years, A flutter, CAD, COPD.  Currently he denies abd pain, fevers, chills, sweats. No diarrhea.  He is not sure why he is here as his BP is always low at HD per his report.  Prior cx from 12/7 grew mixed organisms.  Past Medical History:  Diagnosis Date  . Arthritis   . Atrial flutter ([place])    a. s/p DCCV 07/2017 briefly successful; b. amio and warfarin; c. CHADS2VASc => 5 (CHF, HTN, age x 2, vascular disease);  [name] CAD (coronary artery disease)    a. remote inferior MI s/p stenting; b. Myoview in 10/2016 with normal perfusion with an EF of 46%  . CHF (congestive heart failure) ([place])   . COPD (chronic obstructive pulmonary disease) ([place])   . 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 HD MWF  . GERD (gastroesophageal reflux disease)   . Hyperlipidemia   . Hypertension   . Ischemic cardiomyopathy    a. TTE 05/2017: EF 35-40%, unable to exlcude RWMA, mild MR, mildly dilated RV  . Prostate cancer ([place])   . Renal insufficiency   . Shortness of breath dyspnea    Past Surgical History:  Procedure Laterality Date  . A/V SHUNT INTERVENTION Left 10/24/2017   Procedure: A/V SHUNT INTERVENTION;  Surgeon: Algernon Huxley, MD;  Location: [place] [facility];  Service: Cardiovascular;  Laterality: Left;  . CARDIOVERSION  N/A 07/26/2017   Procedure: Cardioversion;  Surgeon: Corey Skains, MD;  Location: [facility];  Service: Cardiovascular;  Laterality: N/A;  . CARDIOVERSION N/A 08/18/2017   Procedure: CARDIOVERSION;  Surgeon: Corey Skains, MD;  Location: [facility];  Service: Cardiovascular;  Laterality: N/A;  . CARDIOVERSION N/A 10/21/2017   Procedure: CARDIOVERSION;  Surgeon: Wellington Hampshire, MD;  Location: [facility];  Service: Cardiovascular;  Laterality: N/A;  . CARDIOVERSION N/A 11/25/2017   Procedure: CARDIOVERSION;  Surgeon: Wellington Hampshire, MD;  Location: [facility];  Service: Cardiovascular;  Laterality: N/A;  . CATARACT EXTRACTION W/PHACO Right 08/20/2015   Procedure: CATARACT EXTRACTION PHACO AND INTRAOCULAR LENS PLACEMENT ([place]);  Surgeon: Leandrew Koyanagi, MD;  Location: [place];  Service: Ophthalmology;  Laterality: Right;  . CATARACT EXTRACTION W/PHACO Left 09/10/2015   Procedure: CATARACT EXTRACTION PHACO AND INTRAOCULAR LENS PLACEMENT (I[facility]);  Surgeon: Leandrew Koyanagi, MD;  Location: [place];  Service: Ophthalmology;  Laterality: Left;  . CORONARY STENT PLACEMENT    . 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 PERIPHERAL VASCULAR CATHETERIZATION Left 05/20/2015   Procedure: A/V Shuntogram/Fistulagram;  Surgeon: Katha Cabal, MD;  Location: [place] [facility];  Service: Cardiovascular;  Laterality: Left;  . PERIPHERAL VASCULAR CATHETERIZATION N/A 05/20/2015   Procedure: A/V Shunt Intervention;  Surgeon: Katha Cabal,  MD;  Location: [place] [facility];  Service: Cardiovascular;  Laterality: N/A;  . PERIPHERAL VASCULAR CATHETERIZATION Left 08/05/2015   Procedure: A/V Shuntogram/Fistulagram;  Surgeon: Katha Cabal, MD;  Location: [place] [facility];  Service: Cardiovascular;  Laterality: Left;  . PERIPHERAL VASCULAR CATHETERIZATION N/A 08/05/2015   Procedure: A/V Shunt Intervention;  Surgeon: Katha Cabal, MD;  Location: [place] [facility];  Service: Cardiovascular;  Laterality: N/A;  . PERIPHERAL VASCULAR CATHETERIZATION Left 01/06/2016   Procedure: A/V Shuntogram/Fistulagram;  Surgeon: Katha Cabal, MD;  Location: [place] [facility];  Service: Cardiovascular;  Laterality: Left;  . PERIPHERAL VASCULAR CATHETERIZATION N/A 01/06/2016   Procedure: A/V Shunt Intervention;  Surgeon: Katha Cabal, MD;  Location: [place] [facility];  Service: Cardiovascular;  Laterality: N/A;  . PERIPHERAL VASCULAR CATHETERIZATION N/A 12/13/2016   Procedure: Dialysis/Perma Catheter Removal;  Surgeon: Algernon Huxley, MD;  Location: [place] [facility];  Service: Cardiovascular;  Laterality: N/A;  . PERIPHERAL VASCULAR THROMBECTOMY Left 01/02/2018   Procedure: PERIPHERAL VASCULAR THROMBECTOMY;  Surgeon: Algernon Huxley, MD;  Location: [place] [facility];  Service: Cardiovascular;  Laterality: Left;   Social History   Tobacco Use  . Smoking status: Former Smoker    Packs/day: 1.00    Years: 30.00    Pack years: 30.00    Types: Cigarettes    Last attempt to quit: 05/19/2009    Years since quitting: 8.7  . Smokeless tobacco: Never Used  Substance Use Topics  . Alcohol use: No  . Drug use: No   Family History  Problem Relation Age of Onset  . Liver disease Mother   . Heart disease Mother   . Hypertension Mother   . Pancreatic cancer Father   . Kidney cancer Neg Hx   . Kidney disease Neg Hx   . Prostate cancer Neg Hx     Allergies:  Allergies  Allergen Reactions  . Nsaids Nausea And Vomiting    Anti-inflammatories.  . Amoxicillin-Pot Clavulanate Nausea And Vomiting    Has patient had a PCN reaction causing immediate rash, facial/tongue/throat swelling, SOB or lightheadedness with hypotension: No Has patient had a PCN reaction causing severe rash involving mucus membranes or skin necrosis: No Has patient had a PCN reaction that required hospitalization:Yes-vomiting blood Has patient had a PCN reaction occurring within the last  10 years: Yes If all of the above answers are "NO", then may proceed with Cephalosporin use.     Current antibiotics: Antibiotics Given (last 72 hours)    Date/Time Action Medication Dose Rate   02/20/18 1154 New Bag/Given   piperacillin-tazobactam (ZOSYN) IVPB 3.375 g 3.375 g 100 mL/hr   02/20/18 1244 New Bag/Given   vancomycin (VANCOCIN) 2,000 mg in sodium chloride 0.9 % 500 mL IVPB 2,000 mg 250 mL/hr      MEDICATIONS: . amiodarone  200 mg Oral Daily  . atorvastatin  10 mg Oral QPM  . calcium acetate  1,334 mg Oral TID WC  . ceftAZIDime  2 g Intravenous Q M,W,F-1800  . febuxostat  40 mg Oral QPM  . heparin  5,000 Units Subcutaneous Q8H  . [START ON 02/21/2018] levothyroxine  50 mcg Oral QAC breakfast  . midodrine  10 mg Oral TID AC  . montelukast  10 mg Oral QPM  . multivitamin  1 tablet Oral Daily  . nystatin   Topical BID  . [START ON 02/21/2018] pantoprazole  40 mg Oral QAC breakfast  . patiromer  8.4 g  Oral Daily  . [START ON 02/21/2018] umeclidinium-vilanterol  1 puff Inhalation Daily    Review of Systems - 11 systems reviewed and negative per HPI   OBJECTIVE: Temp:  [97.7 F (36.5 C)-98.3 F (36.8 C)] 97.7 F (36.5 C) (02/25 1540) Pulse Rate:  [102-115] 105 (02/25 1540) Resp:  [18-46] 20 (02/25 1540) BP: (77-113)/(53-86) 90/62 (02/25 1540) SpO2:  [90 %-100 %] 90 % (02/25 1540) Weight:  [116.3 kg (256 lb 6.3 oz)] 116.3 kg (256 lb 6.3 oz) (02/25 0640) Physical Exam Constitutional: He is oriented to person, place, and time. obese HENT: anicteric  Mouth/Throat: Oropharynx is clear and moist. No oropharyngeal exudate.  Cardiovascular: Normal rate, regular rhythm and normal heart sounds. Exam reveals no gallop and no friction rub.  No murmur heard.  Pulmonary/Chest: Effort normal and breath sounds normal. No respiratory distress. He has no wheezes.  Abdominal: Soft. Bowel sounds are normal. He exhibits no distension. There is no tenderness.  Lymphadenopathy:  He  has no cervical adenopathy.  Neurological: He is alert and oriented to person, place, and time.  Skin: diffuse vascultiis rash on bil LE Psychiatric: He has a normal mood and affect. His behavior is normal.     LABS: Results for orders placed or performed during the hospital encounter of 02/20/18 (from the past 48 hour(s))  CBC with Differential/Platelet     Status: Abnormal   Collection Time: 02/20/18  4:49 AM  Result Value Ref Range   WBC 12.6 (H) 3.8 - 10.6 K/uL   RBC 3.83 (L) 4.40 - 5.90 MIL/uL   Hemoglobin 11.2 (L) 13.0 - 18.0 g/dL   HCT 35.4 (L) 40.0 - 52.0 %   MCV 92.5 80.0 - 100.0 fL   MCH 29.3 26.0 - 34.0 pg   MCHC 31.7 (L) 32.0 - 36.0 g/dL   RDW 18.3 (H) 11.5 - 14.5 %   Platelets 232 150 - 440 K/uL   Neutrophils Relative % 81 %   Neutro Abs 10.3 (H) 1.4 - 6.5 K/uL   Lymphocytes Relative 10 %   Lymphs Abs 1.2 1.0 - 3.6 K/uL   Monocytes Relative 7 %   Monocytes Absolute 0.8 0.2 - 1.0 K/uL   Eosinophils Relative 1 %   Eosinophils Absolute 0.1 0 - 0.7 K/uL   Basophils Relative 1 %   Basophils Absolute 0.2 (H) 0 - 0.1 K/uL    Comment: Performed at [facility], [address] [place], [place] [address]  Comprehensive metabolic panel     Status: Abnormal   Collection Time: 02/20/18  4:49 AM  Result Value Ref Range   Sodium 133 (L) 135 - 145 mmol/L   Potassium 5.4 (H) 3.5 - 5.1 mmol/L   Chloride 95 (L) 101 - 111 mmol/L   CO2 23 22 - 32 mmol/L   Glucose, Bld 125 (H) 65 - 99 mg/dL   BUN 42 (H) 6 - 20 mg/dL   Creatinine, Ser 7.84 (H) 0.61 - 1.24 mg/dL   Calcium 8.4 (L) 8.9 - 10.3 mg/dL   Total Protein 7.2 6.5 - 8.1 g/dL   Albumin 2.8 (L) 3.5 - 5.0 g/dL   AST 32 15 - 41 U/L   ALT 22 17 - 63 U/L   Alkaline Phosphatase 66 38 - 126 U/L   Total Bilirubin 1.0 0.3 - 1.2 mg/dL   GFR calc non Af Amer 6 (L) >60 mL/min   GFR calc Af Amer 7 (L) >60 mL/min    Comment: (NOTE) The eGFR has been  calculated using the CKD EPI equation. This calculation has not been  validated in all clinical situations. eGFR's persistently <60 mL/min signify possible Chronic Kidney Disease.    Anion gap 15 5 - 15    Comment: Performed at [facility], [address] [place], [place] [address]  Troponin I     Status: Abnormal   Collection Time: 02/20/18  4:49 AM  Result Value Ref Range   Troponin I 0.03 (HH) <0.03 ng/mL    Comment: CRITICAL RESULT CALLED TO, READ BACK BY AND VERIFIED WITH MARY NEEDHAM 02/20/18 @0724  AKT Performed at [facility], [address], [place], [place] [address]   Magnesium     Status: None   Collection Time: 02/20/18  4:49 AM  Result Value Ref Range   Magnesium 2.3 1.7 - 2.4 mg/dL    Comment: Performed at [facility], [address] [place], [place] [address]  Lactic acid, plasma     Status: None   Collection Time: 02/20/18  8:16 AM  Result Value Ref Range   Lactic Acid, Venous 1.6 0.5 - 1.9 mmol/L    Comment: Performed at [facility], [address] [place], [place] [address]  Troponin I     Status: Abnormal   Collection Time: 02/20/18 11:46 AM  Result Value Ref Range   Troponin I 0.03 (HH) <0.03 ng/mL    Comment: CRITICAL RESULT CALLED TO, READ BACK BY AND VERIFIED WITH PAULETTE WYATT @1247  02/20/18 AKT Performed at [facility], [address] [place], [place] [address]    No components found for: ESR, C REACTIVE PROTEIN MICRO: No results found for this or any previous visit (from the past 720 hour(s)).  IMAGING: Ct Abdomen Pelvis W Contrast  Result Date: 02/20/2018 CLINICAL DATA:  Colovesical fistula. Sigmoid diverticulitis with abscess positioned between the sigmoid colon and bladder. Prior aspiration under CT guidance on 01/09/2018 EXAM: CT ABDOMEN AND PELVIS WITH CONTRAST TECHNIQUE: Multidetector CT imaging of the abdomen and pelvis was performed using the standard protocol following bolus administration of intravenous contrast. CONTRAST:  15m ISOVUE-300 IOPAMIDOL  (ISOVUE-300) INJECTION 61% COMPARISON:  CT 01/09/2018, 01/05/2018 FINDINGS: Lower chest: Small bilateral pleural effusions increased from prior. Hepatobiliary: No focal hepatic lesion. No biliary duct dilatation. Gallbladder is normal. Common bile duct is normal. Pancreas: Pancreas is normal. No ductal dilatation. No pancreatic inflammation. Spleen: Normal spleen Adrenals/urinary tract: Bilateral renal cortical thinning no hydronephrosis. Ureters normal. Small a gas within the bladder. Stomach/Bowel: Stomach, small-bowel, cecum normal. Appendix normal. Moderate volume stool throughout the colon. Several diverticula through the sigmoid colon. Rectum normal. Fluid collection positioned between the dome of the bladder and the sigmoid colon measures 5.2 by 4.2 by 4.0 cm (volume = 46 cm^3) compared with 3.1 x 2.8 by 2.8 cm (volume = 13 cm^3) on CT 01/05/2018. There is fluid and gas within this pericolonic collection. No significant inflammation associated with the sigmoid colon. No intraperitoneal free air.  No intraperitoneal free fluid. Vascular/Lymphatic: Abdominal aorta is normal caliber with atherosclerotic calcification. There is no retroperitoneal or periportal lymphadenopathy. No pelvic lymphadenopathy. Reproductive: Prostate normal Other: No free fluid. Musculoskeletal: No aggressive osseous lesion. IMPRESSION: 1. Interval increase in volume of the rounded fluid collection/abscess/fistula track positioned between the sigmoid colon and the bladder. 2. Small amount of gas within the bladder. 3. New bilateral small effusions. Electronically Signed   By: SSuzy BouchardM.D.   On: 02/20/2018 11:15   Dg Chest Portable 1 View  Result Date: 02/20/2018  CLINICAL DATA:  Shortness of breath.  Dialysis EXAM: PORTABLE CHEST 1 VIEW COMPARISON:  10/19/2017 FINDINGS: Chronic cardiopericardial enlargement. Low volume chest with interstitial crowding. There is no edema, consolidation, effusion, or pneumothorax. IMPRESSION:  1. Low volume chest without acute finding. 2. Cardiomegaly. Electronically Signed   By: Monte Fantasia M.D.   On: 02/20/2018 07:17    Assessment:   JAYMARION TROMBLY is a 77 y.o. male admitted from HD with hypotension. He has a complicated hx of intraabdominal abscess, with colovesicular fistula evaluated at [facility].  He was currently being treated with IV vanco and ceftazidime at HD and oral flagyl after 10 day stay at [facility] 2/5-2/14.  Stop date was top be 3/19 when he would be followed up at [facility].  He has a complicated hx including IGA vasculitis, on HD for 4 years, A flutter, CAD, COPD.   CT shows increase in size of fluid collection compared to 01/09/18.  However clinically he denies pain or other changes.  Recommendations Continue abx course planned from time of dc from [facility]-  IV vanco and ceftazidime at HD and oral flagyl until 3/19.  Will continue to be followed at [facility] after DC. Agree with surgery regarding possible aspiration and drain placement but long term surgical management deferred to [facility]. Thank you very much for allowing me to participate in the care of this patient. Please call with questions.   Cheral Marker. Ola Spurr, MD

## 2018-02-20 NOTE — ED Notes (Signed)
Pt states his normal blood pressure is in the high 08H-NGI 71L systolic consistently. Has blood pressures recorded in notebook. Per pt, MD aware.

## 2018-02-20 NOTE — Consult Note (Signed)
Date of Consultation:  02/20/2018  Requesting Physician:  Eula Listen, MD  Reason for Consultation:  Colovesical fistula and abscess  History of Present Illness: Maurice Peterson is a 77 y.o. 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  He has been seen in our office since 11/2017 with an abscess and fistula.  He has been evaluated by Dr. Erlene Quan with Urology as well with cystoscopy and had a repeat one at [facility] with biopsy which was normal.  He was referred to [facility] GI surgery for his colovesical fistula, as we felt he was too complex for surgery and urology here at [facility].  He has been treated conservatively twice through [facility] and once through [facility], and he's currently getting Vancomycin, Ceftazidime, and Ciprofloxacin.    He presents to the ED today due to hypotension found prior to dialysis this morning.  He also reports that he's been feeling weak/fatigued.  He also still have pneumaturia and also intermittent hematuria.  He had a CT scan of abdomen and pelvis which again shows a colovesical fistula, but now his associated abscess is larger at 5.2 x 4.2 x 4 cm from prior 3.1 x 2.8 x 2.8 cm.  His WBC is elevated at 12.6 despite of his antibiotic regimen.  ED called surgical team earlier today but given his complexity, plans were made for transfer back to [facility] for surgical evaluation and possible surgery.  However, [facility] is currently full and thus patient is being admitted to hospitalist team here with surgical consultation while awaiting bed at [facility].  In the interim, the patient also has developed another bout of vasculitis, and he has been seen by nephrology and dermatology.  Unfortunately, steroids have not been able to be started due to his abscess.  He does feel discomfort over both feet and is harder for him to walk.  Past Medical History: Past Medical History:  Diagnosis Date  . Arthritis   . Atrial flutter ([place])    a. s/p DCCV 07/2017 briefly successful; b. amio and  warfarin; c. CHADS2VASc => 5 (CHF, HTN, age x 2, vascular disease);  [name] CAD (coronary artery disease)    a. remote inferior MI s/p stenting; b. Myoview in 10/2016 with normal perfusion with an EF of 46%  . CHF (congestive heart failure) ([place])   . COPD (chronic obstructive pulmonary disease) ([place])   . 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 HD MWF  . GERD (gastroesophageal reflux disease)   . Hyperlipidemia   . Hypertension   . Ischemic cardiomyopathy    a. TTE 05/2017: EF 35-40%, unable to exlcude RWMA, mild MR, mildly dilated RV  . Prostate cancer ([place])   . Renal insufficiency   . Shortness of breath dyspnea      Past Surgical History: Past Surgical History:  Procedure Laterality Date  . A/V SHUNT INTERVENTION Left 10/24/2017   Procedure: A/V SHUNT INTERVENTION;  Surgeon: Algernon Huxley, MD;  Location: [place] CV LAB;  Service: Cardiovascular;  Laterality: Left;  . CARDIOVERSION N/A 07/26/2017   Procedure: Cardioversion;  Surgeon: Corey Skains, MD;  Location: [facility] ORS;  Service: Cardiovascular;  Laterality: N/A;  . CARDIOVERSION N/A 08/18/2017   Procedure: CARDIOVERSION;  Surgeon: Corey Skains, MD;  Location: [facility] OR[facility];  Service: Cardiovascular;  Laterality: N/A;  . CARDIOVERSION N/A 10/21/2017   Procedure: CARDIOVERSION;  Surgeon: Wellington Hampshire, MD;  Location: [facility] OR[facility];  Service: Cardiovascular;  Laterality: N/A;  . CARDIOVERSION  N/A 11/25/2017   Procedure: CARDIOVERSION;  Surgeon: Wellington Hampshire, MD;  Location: [facility] OR[facility];  Service: Cardiovascular;  Laterality: N/A;  . CATARACT EXTRACTION W/PHACO Right 08/20/2015   Procedure: CATARACT EXTRACTION PHACO AND INTRAOCULAR LENS PLACEMENT ([place]);  Surgeon: Leandrew Koyanagi, MD;  Location: [place];  Service: Ophthalmology;  Laterality: Right;  . CATARACT EXTRACTION W/PHACO Left 09/10/2015   Procedure: CATARACT EXTRACTION PHACO AND INTRAOCULAR LENS PLACEMENT (IOC);  Surgeon: Leandrew Koyanagi, MD;   Location: [place];  Service: Ophthalmology;  Laterality: Left;  . CORONARY STENT PLACEMENT    . 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 PERIPHERAL VASCULAR CATHETERIZATION Left 05/20/2015   Procedure: A/V Shuntogram/Fistulagram;  Surgeon: Katha Cabal, MD;  Location: [place] CV LAB;  Service: Cardiovascular;  Laterality: Left;  . PERIPHERAL VASCULAR CATHETERIZATION N/A 05/20/2015   Procedure: A/V Shunt Intervention;  Surgeon: Katha Cabal, MD;  Location: [place] CV LAB;  Service: Cardiovascular;  Laterality: N/A;  . PERIPHERAL VASCULAR CATHETERIZATION Left 08/05/2015   Procedure: A/V Shuntogram/Fistulagram;  Surgeon: Katha Cabal, MD;  Location: [place] CV LAB;  Service: Cardiovascular;  Laterality: Left;  . PERIPHERAL VASCULAR CATHETERIZATION N/A 08/05/2015   Procedure: A/V Shunt Intervention;  Surgeon: Katha Cabal, MD;  Location: [place] CV LAB;  Service: Cardiovascular;  Laterality: N/A;  . PERIPHERAL VASCULAR CATHETERIZATION Left 01/06/2016   Procedure: A/V Shuntogram/Fistulagram;  Surgeon: Katha Cabal, MD;  Location: [place] [facility];  Service: Cardiovascular;  Laterality: Left;  . PERIPHERAL VASCULAR CATHETERIZATION N/A 01/06/2016   Procedure: A/V Shunt Intervention;  Surgeon: Katha Cabal, MD;  Location: [place] [facility];  Service: Cardiovascular;  Laterality: N/A;  . PERIPHERAL VASCULAR CATHETERIZATION N/A 12/13/2016   Procedure: Dialysis/Perma Catheter Removal;  Surgeon: Algernon Huxley, MD;  Location: [place] CV LAB;  Service: Cardiovascular;  Laterality: N/A;  . PERIPHERAL VASCULAR THROMBECTOMY Left 01/02/2018   Procedure: PERIPHERAL VASCULAR THROMBECTOMY;  Surgeon: Algernon Huxley, MD;  Location: [place] CV LAB;  Service: Cardiovascular;  Laterality: Left;    Home Medications: Prior to Admission medications   Medication Sig Start Date End Date Taking? Authorizing Provider  acetaminophen (TYLENOL)  500 MG tablet Take 1,000 mg by mouth daily as needed for moderate pain or headache.   Yes [provider]  albuterol (PROVENTIL HFA;VENTOLIN HFA) 108 (90 BASE) MCG/ACT inhaler Inhale 2 puffs into the lungs every 4 (four) hours as needed for wheezing or shortness of breath.   Yes [provider]  amiodarone (PACERONE) 200 MG tablet Take 1 tablet (200 mg) by mouth once daily 12/29/17  Yes Deboraha Sprang, MD  apixaban (ELIQUIS) 5 MG TABS tablet Take 1 tablet (5 mg total) by mouth 2 (two) times daily. 10/26/17  Yes Fritzi Mandes, MD  atorvastatin (LIPITOR) 10 MG tablet Take 10 mg by mouth every evening.    Yes [provider]  azelastine (OPTIVAR) 0.05 % ophthalmic solution Place 1 drop into both eyes 2 (two) times daily as needed (allergies).   Yes [provider]  calcium acetate (PHOSLO) 667 MG capsule Take 1,334 mg by mouth 3 (three) times daily with meals.   Yes [provider]  cefTAZidime 2 g in sodium chloride 0.9 % 100 mL Inject 2 g into the vein every Monday, Wednesday, and Friday. After dialysis 02/10/18 03/12/18 Yes [provider]  dextromethorphan-guaiFENesin (MUCINEX DM) 30-600 MG 12hr tablet Take 1 tablet by mouth at bedtime as needed for  cough.   Yes [provider]  DiphenhydrAMINE HCl, Sleep, (ZZZQUIL PO) Take 25 mg by mouth at bedtime as needed (sleep).    Yes [provider]  ethyl chloride spray APPLY A SMALL AMOUNT ON SKIN BEFORE NEEDLE INSERTION THREE TIMES PER WEEK 12/29/17  Yes [provider]  febuxostat (ULORIC) 40 MG tablet Take 40 mg by mouth every evening.    Yes [provider]  levothyroxine (SYNTHROID) 50 MCG tablet Take 1 tablet (50 mcg total) by mouth daily before breakfast. 01/05/18  Yes Deboraha Sprang, MD  midodrine (PROAMATINE) 10 MG tablet Take 10 mg by mouth 3 (three) times daily.   Yes [provider]  montelukast (SINGULAIR) 10 MG tablet Take 10 mg by mouth every evening.     Yes [provider]  multivitamin (RENA-VIT) TABS tablet Take 1 tablet by mouth daily.   Yes [provider]  pantoprazole (PROTONIX) 40 MG tablet Take 40 mg by mouth daily before breakfast.    Yes [provider]  umeclidinium-vilanterol (ANORO ELLIPTA) 62.5-25 MCG/INH AEPB Inhale 1 puff into the lungs daily.   Yes [provider]    Allergies: Allergies  Allergen Reactions  . Nsaids Nausea And Vomiting    Anti-inflammatories.  . Amoxicillin-Pot Clavulanate Nausea And Vomiting    Has patient had a PCN reaction causing immediate rash, facial/tongue/throat swelling, SOB or lightheadedness with hypotension: No Has patient had a PCN reaction causing severe rash involving mucus membranes or skin necrosis: No Has patient had a PCN reaction that required hospitalization:Yes-vomiting blood Has patient had a PCN reaction occurring within the last 10 years: Yes If all of the above answers are "NO", then may proceed with Cephalosporin use.     Social History:  reports that he quit smoking about 8 years ago. His smoking use included cigarettes. He has a 30.00 pack-year smoking history. he has never used smokeless tobacco. He reports that he does not drink alcohol or use drugs.   Family History: Family History  Problem Relation Age of Onset  . Liver disease Mother   . Heart disease Mother   . Hypertension Mother   . Pancreatic cancer Father   . Kidney cancer Neg Hx   . Kidney disease Neg Hx   . Prostate cancer Neg Hx     Review of Systems: Review of Systems  Constitutional: Negative for chills and fever.  HENT: Negative for hearing loss.   Eyes: Negative for blurred vision.  Respiratory: Negative for shortness of breath.   Cardiovascular: Negative for chest pain.  Gastrointestinal: Positive for abdominal pain (low pelvis, stable.). Negative for nausea and vomiting.  Genitourinary: Positive for hematuria (and pneumaturia).  Musculoskeletal: Negative  for myalgias.  Skin: Positive for rash (at his feet and legs from vasculitis).  Neurological: Negative for dizziness.  Psychiatric/Behavioral: Negative for depression.  All other systems reviewed and are negative.   Physical Exam BP 90/62 (BP Location: Right Wrist)   Pulse (!) 105   Temp 97.7 F (36.5 C) (Oral)   Resp 20   Ht 5\' 11"  (1.803 m)   Wt 116.3 kg (256 lb 6.3 oz)   SpO2 90%   BMI 35.76 kg/m  CONSTITUTIONAL: No acute distress HEENT:  Normocephalic, atraumatic, extraocular motion intact. NECK: Trachea is midline, and there is no jugular venous distension.  RESPIRATORY:  Lungs are clear, and breath sounds are equal bilaterally. Normal respiratory effort without pathologic use of accessory muscles. CARDIOVASCULAR: Heart is regular without murmurs, gallops, or  rubs. GI: The abdomen is soft, non-distended, with focal point of tenderness is the low pelvis, which is the same area as prior, and appears stable.  No peritonitis. There were no palpable masses. MUSCULOSKELETAL:  Normal muscle strength and tone in all four extremities.  No peripheral edema or cyanosis. SKIN:  Bilateral lower extremity papular rash. NEUROLOGIC:  Motor and sensation is grossly normal.  Cranial nerves are grossly intact. PSYCH:  Alert and oriented to person, place and time. Affect is normal.  Laboratory Analysis: Results for orders placed or performed during the hospital encounter of 02/20/18 (from the past 24 hour(s))  CBC with Differential/Platelet     Status: Abnormal   Collection Time: 02/20/18  4:49 AM  Result Value Ref Range   WBC 12.6 (H) 3.8 - 10.6 K/uL   RBC 3.83 (L) 4.40 - 5.90 MIL/uL   Hemoglobin 11.2 (L) 13.0 - 18.0 g/dL   HCT 35.4 (L) 40.0 - 52.0 %   MCV 92.5 80.0 - 100.0 fL   MCH 29.3 26.0 - 34.0 pg   MCHC 31.7 (L) 32.0 - 36.0 g/dL   RDW 18.3 (H) 11.5 - 14.5 %   Platelets 232 150 - 440 K/uL   Neutrophils Relative % 81 %   Neutro Abs 10.3 (H) 1.4 - 6.5 K/uL   Lymphocytes Relative 10  %   Lymphs Abs 1.2 1.0 - 3.6 K/uL   Monocytes Relative 7 %   Monocytes Absolute 0.8 0.2 - 1.0 K/uL   Eosinophils Relative 1 %   Eosinophils Absolute 0.1 0 - 0.7 K/uL   Basophils Relative 1 %   Basophils Absolute 0.2 (H) 0 - 0.1 K/uL  Comprehensive metabolic panel     Status: Abnormal   Collection Time: 02/20/18  4:49 AM  Result Value Ref Range   Sodium 133 (L) 135 - 145 mmol/L   Potassium 5.4 (H) 3.5 - 5.1 mmol/L   Chloride 95 (L) 101 - 111 mmol/L   CO2 23 22 - 32 mmol/L   Glucose, Bld 125 (H) 65 - 99 mg/dL   BUN 42 (H) 6 - 20 mg/dL   Creatinine, Ser 7.84 (H) 0.61 - 1.24 mg/dL   Calcium 8.4 (L) 8.9 - 10.3 mg/dL   Total Protein 7.2 6.5 - 8.1 g/dL   Albumin 2.8 (L) 3.5 - 5.0 g/dL   AST 32 15 - 41 U/L   ALT 22 17 - 63 U/L   Alkaline Phosphatase 66 38 - 126 U/L   Total Bilirubin 1.0 0.3 - 1.2 mg/dL   GFR calc non Af Amer 6 (L) >60 mL/min   GFR calc Af Amer 7 (L) >60 mL/min   Anion gap 15 5 - 15  Troponin I     Status: Abnormal   Collection Time: 02/20/18  4:49 AM  Result Value Ref Range   Troponin I 0.03 (HH) <0.03 ng/mL  Magnesium     Status: None   Collection Time: 02/20/18  4:49 AM  Result Value Ref Range   Magnesium 2.3 1.7 - 2.4 mg/dL  Lactic acid, plasma     Status: None   Collection Time: 02/20/18  8:16 AM  Result Value Ref Range   Lactic Acid, Venous 1.6 0.5 - 1.9 mmol/L  Troponin I     Status: Abnormal   Collection Time: 02/20/18 11:46 AM  Result Value Ref Range   Troponin I 0.03 (HH) <0.03 ng/mL    Imaging: Ct Abdomen Pelvis W Contrast  Result Date: 02/20/2018 CLINICAL DATA:  Colovesical  fistula. Sigmoid diverticulitis with abscess positioned between the sigmoid colon and bladder. Prior aspiration under CT guidance on 01/09/2018 EXAM: CT ABDOMEN AND PELVIS WITH CONTRAST TECHNIQUE: Multidetector CT imaging of the abdomen and pelvis was performed using the standard protocol following bolus administration of intravenous contrast. CONTRAST:  120mL ISOVUE-300  IOPAMIDOL (ISOVUE-300) INJECTION 61% COMPARISON:  CT 01/09/2018, 01/05/2018 FINDINGS: Lower chest: Small bilateral pleural effusions increased from prior. Hepatobiliary: No focal hepatic lesion. No biliary duct dilatation. Gallbladder is normal. Common bile duct is normal. Pancreas: Pancreas is normal. No ductal dilatation. No pancreatic inflammation. Spleen: Normal spleen Adrenals/urinary tract: Bilateral renal cortical thinning no hydronephrosis. Ureters normal. Small a gas within the bladder. Stomach/Bowel: Stomach, small-bowel, cecum normal. Appendix normal. Moderate volume stool throughout the colon. Several diverticula through the sigmoid colon. Rectum normal. Fluid collection positioned between the dome of the bladder and the sigmoid colon measures 5.2 by 4.2 by 4.0 cm (volume = 46 cm^3) compared with 3.1 x 2.8 by 2.8 cm (volume = 13 cm^3) on CT 01/05/2018. There is fluid and gas within this pericolonic collection. No significant inflammation associated with the sigmoid colon. No intraperitoneal free air.  No intraperitoneal free fluid. Vascular/Lymphatic: Abdominal aorta is normal caliber with atherosclerotic calcification. There is no retroperitoneal or periportal lymphadenopathy. No pelvic lymphadenopathy. Reproductive: Prostate normal Other: No free fluid. Musculoskeletal: No aggressive osseous lesion. IMPRESSION: 1. Interval increase in volume of the rounded fluid collection/abscess/fistula track positioned between the sigmoid colon and the bladder. 2. Small amount of gas within the bladder. 3. New bilateral small effusions. Electronically Signed   By: Suzy Bouchard M.D.   On: 02/20/2018 11:15   Dg Chest Portable 1 View  Result Date: 02/20/2018 CLINICAL DATA:  Shortness of breath.  Dialysis EXAM: PORTABLE CHEST 1 VIEW COMPARISON:  10/19/2017 FINDINGS: Chronic cardiopericardial enlargement. Low volume chest with interstitial crowding. There is no edema, consolidation, effusion, or pneumothorax.  IMPRESSION: 1. Low volume chest without acute finding. 2. Cardiomegaly. Electronically Signed   By: Monte Fantasia M.D.   On: 02/20/2018 07:17    Assessment and Plan: This is a 77 y.o. male who presents with fatigue/weakness and noted hypotension prior to dialysis today.  I have independently viewed the patient's imaging studies and have reviewed his laboratory studies.  His CT scan today does show same colovesical fistula and surrounding abscess as on prior CT scans, though this one is larger in size.  There is gas in his bladder, likely from his colovesical fistula.  His WBC is mildly elevated to 12.6, but he is on three antibiotics.  He is a complex patient and not a great surgical candidate given his comorbidities.  Patient is pending transfer to [facility] when a bed becomes available.  He is admitted to hospitalist service for now and there is a consult for IR for evaluation.  I have placed order for percutaneous drainage of abscess, though on prior occasions, IR has been unable to place a drain and only aspirate at most.  However, given that it's larger in size, this may be an option this time around.  Agree with continuing his antibiotics and holding Eliquis for possible IR procedure.  At this point, there is no acute urgent surgical need, as he is not toxic, but discussed with patient we would defer to [facility] to discuss the possibility of surgery this admission.  Will continue to follow along with you.    Melvyn Neth, [place]

## 2018-02-20 NOTE — ED Notes (Signed)
Pt is unable to give urine specimen at this time. States "you might never get it." Pt is a dialysis pt and reports that he rarely produces urine.

## 2018-02-20 NOTE — Progress Notes (Signed)
Patient arrived from the ED at 1540

## 2018-02-20 NOTE — H&P (Addendum)
[facility] NAME: Maurice Peterson    MR#:  [id number]  DATE OF BIRTH:  1941-11-25  DATE OF ADMISSION:  02/20/2018  PRIMARY CARE PHYSICIAN: Baxter Hire, MD   REQUESTING/REFERRING PHYSICIAN:   CHIEF COMPLAINT:   Chief Complaint  Patient presents with  . Hypotension    HISTORY OF PRESENT ILLNESS: Maurice Peterson  is a 77 y.o. male with a known history per below, including orthostatic hypotension, vasculitis, recently discharged from [facility] system for colovesicular fistular with associated abscess-treated nonoperatively with home antibiotics ceftazidime/vancomycin with hemodialysis, p.o. Cipro, patient was deemed too high risk for surgery at that time, patient presents to the emergency room today as the patient was found to be hypotensive prior to hemodialysis treatment, blood pressure was in the 70s while there, in route via EMS his blood pressure was normal, in the emergency room his blood pressure was in the 80s, ER workup noted for a white count of 12,000, lactic acid level was normal, CT abdomen was noted for slightly increased size in the fluid collection from 3-5 cm per ED attending, chest x-ray negative, potassium level 5.4, creatinine 7.8, patient evaluated in the emergency room with general surgery, patient in no apparent distress, no complaints, family at the bedside, patient clinically looks dehydrated, patient is now being admitted for colovesicular fistula with associated abscess, end-stage renal disease, and acute hypotension.  PAST MEDICAL HISTORY:   Past Medical History:  Diagnosis Date  . Arthritis   . Atrial flutter ([place])    a. s/p DCCV 07/2017 briefly successful; b. amio and warfarin; c. CHADS2VASc => 5 (CHF, HTN, age x 2, vascular disease);  [name] CAD (coronary artery disease)    a. remote inferior MI s/p stenting; b. Myoview in 10/2016 with normal perfusion with an EF of 46%  . CHF (congestive heart failure) ([place])   . COPD  (chronic obstructive pulmonary disease) ([place])   . ESRD (end stage renal disease) on dialysis ([place])    a. HD MWF  . GERD (gastroesophageal reflux disease)   . Hyperlipidemia   . Hypertension   . Ischemic cardiomyopathy    a. TTE 05/2017: EF 35-40%, unable to exlcude RWMA, mild MR, mildly dilated RV  . Prostate cancer ([place])   . Renal insufficiency   . Shortness of breath dyspnea     PAST SURGICAL HISTORY:  Past Surgical History:  Procedure Laterality Date  . A/V SHUNT INTERVENTION Left 10/24/2017   Procedure: A/V SHUNT INTERVENTION;  Surgeon: Algernon Huxley, MD;  Location: [facility];  Service: Cardiovascular;  Laterality: Left;  . CARDIOVERSION N/A 07/26/2017   Procedure: Cardioversion;  Surgeon: Corey Skains, MD;  Location: [facility];  Service: Cardiovascular;  Laterality: N/A;  . CARDIOVERSION N/A 08/18/2017   Procedure: CARDIOVERSION;  Surgeon: Corey Skains, MD;  Location: [facility];  Service: Cardiovascular;  Laterality: N/A;  . CARDIOVERSION N/A 10/21/2017   Procedure: CARDIOVERSION;  Surgeon: Wellington Hampshire, MD;  Location: [facility];  Service: Cardiovascular;  Laterality: N/A;  . CARDIOVERSION N/A 11/25/2017   Procedure: CARDIOVERSION;  Surgeon: Wellington Hampshire, MD;  Location: [facility];  Service: Cardiovascular;  Laterality: N/A;  . CATARACT EXTRACTION W/PHACO Right 08/20/2015   Procedure: CATARACT EXTRACTION PHACO AND INTRAOCULAR LENS PLACEMENT ([place]);  Surgeon: Leandrew Koyanagi, MD;  Location: [facility];  Service: Ophthalmology;  Laterality: Right;  . CATARACT EXTRACTION W/PHACO Left 09/10/2015   Procedure: CATARACT EXTRACTION PHACO AND INTRAOCULAR LENS PLACEMENT (  [place]);  Surgeon: Leandrew Koyanagi, MD;  Location: [place];  Service: Ophthalmology;  Laterality: Left;  . CORONARY STENT PLACEMENT    . 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 PERIPHERAL VASCULAR CATHETERIZATION Left 05/20/2015   Procedure: A/V  Shuntogram/Fistulagram;  Surgeon: Katha Cabal, MD;  Location: [place] [facility];  Service: Cardiovascular;  Laterality: Left;  . PERIPHERAL VASCULAR CATHETERIZATION N/A 05/20/2015   Procedure: A/V Shunt Intervention;  Surgeon: Katha Cabal, MD;  Location: [place] [facility];  Service: Cardiovascular;  Laterality: N/A;  . PERIPHERAL VASCULAR CATHETERIZATION Left 08/05/2015   Procedure: A/V Shuntogram/Fistulagram;  Surgeon: Katha Cabal, MD;  Location: [facility];  Service: Cardiovascular;  Laterality: Left;  . PERIPHERAL VASCULAR CATHETERIZATION N/A 08/05/2015   Procedure: A/V Shunt Intervention;  Surgeon: Katha Cabal, MD;  Location: [facility];  Service: Cardiovascular;  Laterality: N/A;  . PERIPHERAL VASCULAR CATHETERIZATION Left 01/06/2016   Procedure: A/V Shuntogram/Fistulagram;  Surgeon: Katha Cabal, MD;  Location: [place] [facility];  Service: Cardiovascular;  Laterality: Left;  . PERIPHERAL VASCULAR CATHETERIZATION N/A 01/06/2016   Procedure: A/V Shunt Intervention;  Surgeon: Katha Cabal, MD;  Location: [facility];  Service: Cardiovascular;  Laterality: N/A;  . PERIPHERAL VASCULAR CATHETERIZATION N/A 12/13/2016   Procedure: Dialysis/Perma Catheter Removal;  Surgeon: Algernon Huxley, MD;  Location: [facility];  Service: Cardiovascular;  Laterality: N/A;  . PERIPHERAL VASCULAR THROMBECTOMY Left 01/02/2018   Procedure: PERIPHERAL VASCULAR THROMBECTOMY;  Surgeon: Algernon Huxley, MD;  Location: [facility];  Service: Cardiovascular;  Laterality: Left;    SOCIAL HISTORY:  Social History   Tobacco Use  . Smoking status: Former Smoker    Packs/day: 1.00    Years: 30.00    Pack years: 30.00    Types: Cigarettes    Last attempt to quit: 05/19/2009    Years since quitting: 8.7  . Smokeless tobacco: Never Used  Substance Use Topics  . Alcohol use: No    FAMILY HISTORY:  Family History  Problem Relation Age of Onset   . Liver disease Mother   . Heart disease Mother   . Hypertension Mother   . Pancreatic cancer Father   . Kidney cancer Neg Hx   . Kidney disease Neg Hx   . Prostate cancer Neg Hx     DRUG ALLERGIES:  Allergies  Allergen Reactions  . Nsaids Nausea And Vomiting    Anti-inflammatories.  . Amoxicillin-Pot Clavulanate Nausea And Vomiting    Has patient had a PCN reaction causing immediate rash, facial/tongue/throat swelling, SOB or lightheadedness with hypotension: No Has patient had a PCN reaction causing severe rash involving mucus membranes or skin necrosis: No Has patient had a PCN reaction that required hospitalization:Yes-vomiting blood Has patient had a PCN reaction occurring within the last 10 years: Yes If all of the above answers are "NO", then may proceed with Cephalosporin use.     REVIEW OF SYSTEMS:   CONSTITUTIONAL: No fever, fatigue or weakness.  EYES: No blurred or double vision.  EARS, NOSE, AND THROAT: No tinnitus or ear pain.  RESPIRATORY: No cough, shortness of breath, wheezing or hemoptysis.  CARDIOVASCULAR: No chest pain, orthopnea, edema.  GASTROINTESTINAL: No nausea, vomiting, diarrhea or abdominal pain.  GENITOURINARY: No dysuria, hematuria.  ENDOCRINE: No polyuria, nocturia,  HEMATOLOGY: No anemia, easy bruising or bleeding SKIN: No rash or lesion. MUSCULOSKELETAL: No joint pain or arthritis.   NEUROLOGIC:  No tingling, numbness, weakness.  PSYCHIATRY: No anxiety or depression.   MEDICATIONS AT HOME:  Prior to Admission medications   Medication Sig Start Date End Date Taking? Authorizing Provider  acetaminophen (TYLENOL) 500 MG tablet Take 1,000 mg by mouth daily as needed for moderate pain or headache.   Yes [provider]  albuterol (PROVENTIL HFA;VENTOLIN HFA) 108 (90 BASE) MCG/ACT inhaler Inhale 2 puffs into the lungs every 4 (four) hours as needed for wheezing or shortness of breath.   Yes [provider]  amiodarone (PACERONE)  200 MG tablet Take 1 tablet (200 mg) by mouth once daily 12/29/17  Yes Deboraha Sprang, MD  apixaban (ELIQUIS) 5 MG TABS tablet Take 1 tablet (5 mg total) by mouth 2 (two) times daily. 10/26/17  Yes Fritzi Mandes, MD  atorvastatin (LIPITOR) 10 MG tablet Take 10 mg by mouth every evening.    Yes [provider]  azelastine (OPTIVAR) 0.05 % ophthalmic solution Place 1 drop into both eyes 2 (two) times daily as needed (allergies).   Yes [provider]  calcium acetate (PHOSLO) 667 MG capsule Take 1,334 mg by mouth 3 (three) times daily with meals.   Yes [provider]  cefTAZidime 2 g in sodium chloride 0.9 % 100 mL Inject 2 g into the vein every Monday, Wednesday, and Friday. After dialysis 02/10/18 03/12/18 Yes [provider]  dextromethorphan-guaiFENesin (MUCINEX DM) 30-600 MG 12hr tablet Take 1 tablet by mouth at bedtime as needed for cough.   Yes [provider]  DiphenhydrAMINE HCl, Sleep, (ZZZQUIL PO) Take 25 mg by mouth at bedtime as needed (sleep).    Yes [provider]  ethyl chloride spray APPLY A SMALL AMOUNT ON SKIN BEFORE NEEDLE INSERTION THREE TIMES PER WEEK 12/29/17  Yes [provider]  febuxostat (ULORIC) 40 MG tablet Take 40 mg by mouth every evening.    Yes [provider]  levothyroxine (SYNTHROID) 50 MCG tablet Take 1 tablet (50 mcg total) by mouth daily before breakfast. 01/05/18  Yes Deboraha Sprang, MD  midodrine (PROAMATINE) 10 MG tablet Take 10 mg by mouth 3 (three) times daily.   Yes [provider]  montelukast (SINGULAIR) 10 MG tablet Take 10 mg by mouth every evening.    Yes [provider]  multivitamin (RENA-VIT) TABS tablet Take 1 tablet by mouth daily.   Yes [provider]  pantoprazole (PROTONIX) 40 MG tablet Take 40 mg by mouth daily before breakfast.    Yes [provider]  umeclidinium-vilanterol (ANORO ELLIPTA) 62.5-25 MCG/INH AEPB Inhale 1 puff into the lungs  daily.   Yes [provider]      PHYSICAL EXAMINATION:   VITAL SIGNS: Blood pressure 93/71, pulse (!) 105, temperature 98.3 F (36.8 C), temperature source Oral, resp. rate 18, height 5\' 11"  (1.803 m), weight 116.3 kg (256 lb 6.3 oz), SpO2 97 %.  GENERAL:  77 y.o.-year-old patient lying in the bed with no acute distress.  EYES: Pupils equal, round, reactive to light and accommodation. No scleral icterus. Extraocular muscles intact.  HEENT: Head atraumatic, normocephalic. Oropharynx and nasopharynx clear.  NECK:  Supple, no jugular venous distention. No thyroid enlargement, no tenderness.  LUNGS: Normal breath sounds bilaterally, no wheezing, rales,rhonchi or crepitation. No use of accessory muscles of respiration.  CARDIOVASCULAR: S1, S2 normal. No murmurs, rubs, or gallops.  ABDOMEN: Soft, nontender, nondistended. Bowel sounds present. No organomegaly or mass.  EXTREMITIES: No pedal edema, cyanosis, or clubbing.  NEUROLOGIC: Cranial nerves  II through XII are intact. Muscle strength 5/5 in all extremities. Sensation intact. Gait not checked.  PSYCHIATRIC: The patient is alert and oriented x 3.  SKIN: Diffuse petechiae    LABORATORY PANEL:   CBC Recent Labs  Lab 02/20/18 0449  WBC 12.6*  HGB 11.2*  HCT 35.4*  PLT 232  MCV 92.5  MCH 29.3  MCHC 31.7*  RDW 18.3*  LYMPHSABS 1.2  MONOABS 0.8  EOSABS 0.1  BASOSABS 0.2*   ------------------------------------------------------------------------------------------------------------------  Chemistries  Recent Labs  Lab 02/20/18 0449  NA 133*  K 5.4*  CL 95*  CO2 23  GLUCOSE 125*  BUN 42*  CREATININE 7.84*  CALCIUM 8.4*  MG 2.3  AST 32  ALT 22  ALKPHOS 66  BILITOT 1.0   ------------------------------------------------------------------------------------------------------------------ estimated creatinine clearance is 10.4 mL/min (A) (by C-G formula based on SCr of 7.84 mg/dL  (H)). ------------------------------------------------------------------------------------------------------------------ No results for input(s): TSH, T4TOTAL, T3FREE, THYROIDAB in the last 72 hours.  Invalid input(s): FREET3   Coagulation profile No results for input(s): INR, PROTIME in the last 168 hours. ------------------------------------------------------------------------------------------------------------------- No results for input(s): DDIMER in the last 72 hours. -------------------------------------------------------------------------------------------------------------------  Cardiac Enzymes Recent Labs  Lab 02/20/18 0449 02/20/18 1146  TROPONINI 0.03* 0.03*   ------------------------------------------------------------------------------------------------------------------ Invalid input(s): POCBNP  ---------------------------------------------------------------------------------------------------------------  Urinalysis    Component Value Date/Time   COLORURINE RED (A) 12/18/2017 0549   APPEARANCEUR GROSSLY BLOODY (A) 12/18/2017 0549   APPEARANCEUR Clear 07/25/2014 1040   LABSPEC  12/18/2017 0549    TEST NOT REPORTED DUE TO COLOR INTERFERENCE OF URINE PIGMENT   LABSPEC 1.009 07/25/2014 1040   PHURINE  12/18/2017 0549    TEST NOT REPORTED DUE TO COLOR INTERFERENCE OF URINE PIGMENT   GLUCOSEU (A) 12/18/2017 0549    TEST NOT REPORTED DUE TO COLOR INTERFERENCE OF URINE PIGMENT   GLUCOSEU Negative 07/25/2014 1040   HGBUR (A) 12/18/2017 0549    TEST NOT REPORTED DUE TO COLOR INTERFERENCE OF URINE PIGMENT   BILIRUBINUR (A) 12/18/2017 0549    TEST NOT REPORTED DUE TO COLOR INTERFERENCE OF URINE PIGMENT   BILIRUBINUR Negative 07/25/2014 1040   KETONESUR (A) 12/18/2017 0549    TEST NOT REPORTED DUE TO COLOR INTERFERENCE OF URINE PIGMENT   PROTEINUR (A) 12/18/2017 0549    TEST NOT REPORTED DUE TO COLOR INTERFERENCE OF URINE PIGMENT   NITRITE (A) 12/18/2017 0549     TEST NOT REPORTED DUE TO COLOR INTERFERENCE OF URINE PIGMENT   LEUKOCYTESUR (A) 12/18/2017 0549    TEST NOT REPORTED DUE TO COLOR INTERFERENCE OF URINE PIGMENT   LEUKOCYTESUR Negative 07/25/2014 1040     RADIOLOGY: Ct Abdomen Pelvis W Contrast  Result Date: 02/20/2018 CLINICAL DATA:  Colovesical fistula. Sigmoid diverticulitis with abscess positioned between the sigmoid colon and bladder. Prior aspiration under CT guidance on 01/09/2018 EXAM: CT ABDOMEN AND PELVIS WITH CONTRAST TECHNIQUE: Multidetector CT imaging of the abdomen and pelvis was performed using the standard protocol following bolus administration of intravenous contrast. CONTRAST:  139mL ISOVUE-300 IOPAMIDOL (ISOVUE-300) INJECTION 61% COMPARISON:  CT 01/09/2018, 01/05/2018 FINDINGS: Lower chest: Small bilateral pleural effusions increased from prior. Hepatobiliary: No focal hepatic lesion. No biliary duct dilatation. Gallbladder is normal. Common bile duct is normal. Pancreas: Pancreas is normal. No ductal dilatation. No pancreatic inflammation. Spleen: Normal spleen Adrenals/urinary tract: Bilateral renal cortical thinning no hydronephrosis. Ureters normal. Small a gas within the bladder. Stomach/Bowel: Stomach, small-bowel, cecum normal. Appendix normal. Moderate volume stool throughout the colon. Several diverticula through the sigmoid colon. Rectum normal.  Fluid collection positioned between the dome of the bladder and the sigmoid colon measures 5.2 by 4.2 by 4.0 cm (volume = 46 cm^3) compared with 3.1 x 2.8 by 2.8 cm (volume = 13 cm^3) on CT 01/05/2018. There is fluid and gas within this pericolonic collection. No significant inflammation associated with the sigmoid colon. No intraperitoneal free air.  No intraperitoneal free fluid. Vascular/Lymphatic: Abdominal aorta is normal caliber with atherosclerotic calcification. There is no retroperitoneal or periportal lymphadenopathy. No pelvic lymphadenopathy. Reproductive: Prostate normal  Other: No free fluid. Musculoskeletal: No aggressive osseous lesion. IMPRESSION: 1. Interval increase in volume of the rounded fluid collection/abscess/fistula track positioned between the sigmoid colon and the bladder. 2. Small amount of gas within the bladder. 3. New bilateral small effusions. Electronically Signed   By: Suzy Bouchard M.D.   On: 02/20/2018 11:15   Dg Chest Portable 1 View  Result Date: 02/20/2018 CLINICAL DATA:  Shortness of breath.  Dialysis EXAM: PORTABLE CHEST 1 VIEW COMPARISON:  10/19/2017 FINDINGS: Chronic cardiopericardial enlargement. Low volume chest with interstitial crowding. There is no edema, consolidation, effusion, or pneumothorax. IMPRESSION: 1. Low volume chest without acute finding. 2. Cardiomegaly. Electronically Signed   By: Monte Fantasia M.D.   On: 02/20/2018 07:17    EKG: Orders placed or performed during the hospital encounter of 02/20/18  . ED EKG  . ED EKG  . EKG 12-Lead  . EKG 12-Lead  . EKG 12-Lead  . 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 Recently [facility] regarding [facility] malady, surgery at that time was thought to be too risky Noted slight increase in size on repeat CT of the abdomen Admit to regular nursing for bed, in discussion with ED attending, general surgery, IR will be consulted for possible drainage, continue prior antibiotics-ceftazidime/vancomycin with hemodialysis, change Cipro to IV, consult infectious disease, gentle IV fluids for rehydration  2 acute on chronic hypotension Exacerbated by acute dehydration as patient appears clinically dehydrated Continue home dose of Midodrine 3 times daily, gentle IV fluids for rehydration, vitals per routine, make changes as per necessary  3 history of atrial flutter Stable Continue amiodarone, Eliquis on hold given plan procedure stated above, Toprol-XL on hold given hypotension  4 chronic systolic congestive  heart failure/ischemic cardiomyopathy without exacerbation Stable Continue statin therapy, Eliquis on hold  5 chronic vasculitis  Stable Non-candidate for steroid use given concern for possible exacerbation of diverticulitis  6 COPD without exacerbation Breathing treatments as needed  7 chronic GERD without esophagitis PPI daily  8 morbid obesity, chronic Lifestyle modification recommended  9 chronic gout Stable on home regiment which will be continued   All the records are reviewed and case discussed with ED provider. Management plans discussed with the patient, family and they are in agreement.  CODE STATUS:full Code Status History    Date Active Date Inactive Code Status Order ID Comments User Context   12/01/2017 17:51 12/03/2017 18:27 Partial Code [id number]  Hillary Bow, MD Inpatient   12/01/2017 16:37 12/01/2017 17:51 Full Code [id number]  Olean Ree, MD Inpatient   10/19/2017 20:00 10/26/2017 19:13 Full Code [id number]  Max Sane, MD Inpatient   06/20/2017 16:48 06/22/2017 19:26 Full Code [id number]  Bettey Costa, MD Inpatient   06/17/2017 18:50 06/19/2017 14:32 Full Code [id number]  Henreitta Leber, MD Inpatient   08/05/2015 15:25 08/05/2015 18:45 Full Code [id number]  Katha Cabal, MD Inpatient   05/20/2015 16:18 05/20/2015 19:37 Full Code [id number]  Schnier, Dolores Lory, MD Inpatient    Questions for Most Recent Historical Code Status (Order [id number])    Question Answer Comment   In the event of cardiac or respiratory ARREST: Initiate Code Blue, Call Rapid Response Yes    In the event of cardiac or respiratory ARREST: Perform CPR Yes    In the event of cardiac or respiratory ARREST: Perform Intubation/Mechanical Ventilation No    In the event of cardiac or respiratory ARREST: Use NIPPV/BiPAp only if indicated Yes    In the event of cardiac or respiratory ARREST: Administer ACLS medications if indicated Yes    In the event of cardiac or respiratory ARREST: Perform  Defibrillation or Cardioversion if indicated Yes         Advance Directive Documentation     Most Recent Value  Type of Advance Directive  Healthcare Power of Attorney, Living will  Pre-existing out of facility DNR order (yellow form or pink MOST form)  No data  "MOST" Form in Place?  No data       TOTAL TIME TAKING CARE OF THIS PATIENT: 45 minutes of critical care time was used to review chart, discuss plan of care with subspecialty staff which included ED attending, general surgery, the patient, the patient's wife, the patient's adult children with all questions answered.    Avel Peace Shalunda Lindh M.D on 02/20/2018   Between 7am to 6pm - Pager - [phone]  After 6pm go to [email or web address] - password [email or web address] [facility]  Office  [phone]  CC: Primary care physician; Baxter Hire, MD   Note: This dictation was prepared with Dragon dictation along with smaller phrase technology. Any transcriptional errors that result from this process are unintentional.

## 2018-02-20 NOTE — ED Provider Notes (Signed)
[facility] Emergency Department Provider Note  ____________________________________________  Time seen: Approximately 7:57 AM  I have reviewed the triage vital signs and the nursing notes.   HISTORY  Chief Complaint Hypotension    HPI Maurice Peterson is a 77 y.o. male with ESRD on HD, recent discharge from [facility] for vesicular colonic fistula with infection, sent from dialysis for hypotension.  The patient reports that he has been feeling generally weak since December, and that this is grossly unchanged.  He did undergo full dialysis on Friday, and today presented to his dialysis center for routine dialysis when he was found to be hypotensive to the 70s.  The patient does have a record of all his daily blood pressures on dialysis days, and he does tend to run in the systolics of high 40C to low 100s.  The patient denies any chest pain, shortness of breath, fevers or chills, nausea or vomiting, diarrhea.  He has noted hematuria for the past 3 days.  He has been taking oral as well as IV antibiotics with dialysis, but is not sure which antibiotics he has been on.  At this time, other than generalized fatigue, the patient has no focal symptoms  Past Medical History:  Diagnosis Date  . Arthritis   . Atrial flutter ([place])    a. s/p DCCV 07/2017 briefly successful; b. amio and warfarin; c. CHADS2VASc => 5 (CHF, HTN, age x 2, vascular disease);  [name] CAD (coronary artery disease)    a. remote inferior MI s/p stenting; b. Myoview in 10/2016 with normal perfusion with an EF of 46%  . CHF (congestive heart failure) ([place])   . COPD (chronic obstructive pulmonary disease) ([place])   . ESRD (end stage renal disease) on dialysis ([place])    a. HD MWF  . GERD (gastroesophageal reflux disease)   . Hyperlipidemia   . Hypertension   . Ischemic cardiomyopathy    a. TTE 05/2017: EF 35-40%, unable to exlcude RWMA, mild MR, mildly dilated RV  . Prostate cancer ([place])   . Renal insufficiency   .  Shortness of breath dyspnea     Patient Active Problem List   Diagnosis Date Noted  . 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 Hematuria 12/28/2017  . Hyperkalemia 12/28/2017  . Hypocalcemia 12/28/2017  . HLD (hyperlipidemia) 12/28/2017  . Diverticulitis of large intestine with perforation and abscess 12/01/2017  . ESRD (end stage renal disease) ([place]) 11/03/2017  . Atypical atrial flutter ([place])   . Sepsis ([place]) 10/19/2017  . Hypotension due to drugs 08/31/2017  . Paroxysmal A-fib ([place]) 06/23/2017  . Respiratory failure ([place]) 06/20/2017  . COPD mixed type ([place]) 06/20/2017  . Atrial flutter with rapid ventricular response ([place]) 06/17/2017  . Chronic renal failure 11/08/2016  . Complication of vascular access for dialysis 11/08/2016  . Essential hypertension, benign 11/08/2016  . Hypercholesterolemia 11/08/2016  . Bilateral leg edema 04/12/2016  . Hyperlipidemia, mixed 10/20/2015  . Chronic systolic congestive heart failure ([place]) 04/08/2015  . Allergic state 04/01/2015  . Glomerulonephritis 04/01/2015  . Gout 04/01/2015  . Prostate cancer ([place]) 04/01/2015  . Personal history of renal cell cancer 11/06/2014  . Old inferior wall myocardial infarction 09/27/2014  . Asthma without status asthmaticus 09/25/2014  . CAD (coronary artery disease) 09/25/2014  . Personal history of malignant neoplasm of prostate 09/25/2014  . Vitamin D deficiency 09/25/2014  . Chronic kidney disease, stage IV (severe) ([place]) 07/03/2013  . Benign essential HTN 07/03/2013  . Increased frequency of urination 03/15/2013  .  Microscopic hematuria 09/21/2012  . 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 Incomplete emptying of bladder 09/19/2012  . Sciatica 09/19/2012  . Benign hypertensive kidney disease with chronic kidney disease 01/14/2011    Past Surgical History:  Procedure Laterality Date  . A/V SHUNT INTERVENTION Left 10/24/2017   Procedure: A/V SHUNT INTERVENTION;   Surgeon: Algernon Huxley, MD;  Location: [place] [facility];  Service: Cardiovascular;  Laterality: Left;  . CARDIOVERSION N/A 07/26/2017   Procedure: Cardioversion;  Surgeon: Corey Skains, MD;  Location: [facility];  Service: Cardiovascular;  Laterality: N/A;  . CARDIOVERSION N/A 08/18/2017   Procedure: CARDIOVERSION;  Surgeon: Corey Skains, MD;  Location: [facility];  Service: Cardiovascular;  Laterality: N/A;  . CARDIOVERSION N/A 10/21/2017   Procedure: CARDIOVERSION;  Surgeon: Wellington Hampshire, MD;  Location: [facility];  Service: Cardiovascular;  Laterality: N/A;  . CARDIOVERSION N/A 11/25/2017   Procedure: CARDIOVERSION;  Surgeon: Wellington Hampshire, MD;  Location: [facility];  Service: Cardiovascular;  Laterality: N/A;  . CATARACT EXTRACTION W/PHACO Right 08/20/2015   Procedure: CATARACT EXTRACTION PHACO AND INTRAOCULAR LENS PLACEMENT ([place]);  Surgeon: Leandrew Koyanagi, MD;  Location: [place];  Service: Ophthalmology;  Laterality: Right;  . CATARACT EXTRACTION W/PHACO Left 09/10/2015   Procedure: CATARACT EXTRACTION PHACO AND INTRAOCULAR LENS PLACEMENT (IOC);  Surgeon: Leandrew Koyanagi, MD;  Location: [place];  Service: Ophthalmology;  Laterality: Left;  . CORONARY STENT PLACEMENT    . 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 PERIPHERAL VASCULAR CATHETERIZATION Left 05/20/2015   Procedure: A/V Shuntogram/Fistulagram;  Surgeon: Katha Cabal, MD;  Location: [place] [facility];  Service: Cardiovascular;  Laterality: Left;  . PERIPHERAL VASCULAR CATHETERIZATION N/A 05/20/2015   Procedure: A/V Shunt Intervention;  Surgeon: Katha Cabal, MD;  Location: [place] [facility];  Service: Cardiovascular;  Laterality: N/A;  . PERIPHERAL VASCULAR CATHETERIZATION Left 08/05/2015   Procedure: A/V Shuntogram/Fistulagram;  Surgeon: Katha Cabal, MD;  Location: [place] [facility];  Service: Cardiovascular;  Laterality: Left;  . PERIPHERAL VASCULAR  CATHETERIZATION N/A 08/05/2015   Procedure: A/V Shunt Intervention;  Surgeon: Katha Cabal, MD;  Location: [place] [facility];  Service: Cardiovascular;  Laterality: N/A;  . PERIPHERAL VASCULAR CATHETERIZATION Left 01/06/2016   Procedure: A/V Shuntogram/Fistulagram;  Surgeon: Katha Cabal, MD;  Location: [place] [facility];  Service: Cardiovascular;  Laterality: Left;  . PERIPHERAL VASCULAR CATHETERIZATION N/A 01/06/2016   Procedure: A/V Shunt Intervention;  Surgeon: Katha Cabal, MD;  Location: [place] [facility];  Service: Cardiovascular;  Laterality: N/A;  . PERIPHERAL VASCULAR CATHETERIZATION N/A 12/13/2016   Procedure: Dialysis/Perma Catheter Removal;  Surgeon: Algernon Huxley, MD;  Location: [place] [facility];  Service: Cardiovascular;  Laterality: N/A;  . PERIPHERAL VASCULAR THROMBECTOMY Left 01/02/2018   Procedure: PERIPHERAL VASCULAR THROMBECTOMY;  Surgeon: Algernon Huxley, MD;  Location: [place] [facility];  Service: Cardiovascular;  Laterality: Left;    Current Outpatient Rx  . Order #: [id number] Class: Historical Med  . Order #: [id number] Class: Historical Med  . Order #: [id number] Class: No Print  . Order #: [id number] Class: Normal  . Order #: [id number] Class: Historical Med  . Order #: [id number] Class: Historical Med  . Order #: [id number] Class: Historical Med  . Order #: [id number] Class: Historical Med  . Order #: [id number] Class: Historical Med  . Order #: [id number] Class: Historical Med  . Order #: [id number] Class: Historical Med  . Order #: [id number] Class: Normal  .  Order #: [id number] Class: No Print  . Order #: [id number] Class: Historical Med  . Order #: [id number] Class: Historical Med  . Order #: [id number] Class: Historical Med  . Order #: [id number] Class: Historical Med  . Order #: [id number] Class: Historical Med    Allergies Nsaids and Amoxicillin-pot clavulanate  Family History  Problem Relation Age of Onset  . Liver disease Mother   . Heart disease Mother    . Hypertension Mother   . Pancreatic cancer Father   . Kidney cancer Neg Hx   . Kidney disease Neg Hx   . Prostate cancer Neg Hx     Social History Social History   Tobacco Use  . Smoking status: Former Smoker    Packs/day: 1.00    Years: 30.00    Pack years: 30.00    Types: Cigarettes    Last attempt to quit: 05/19/2009    Years since quitting: 8.7  . Smokeless tobacco: Never Used  Substance Use Topics  . Alcohol use: No  . Drug use: No    Review of Systems Constitutional: No fever/chills.  Positive generalized fatigue and weakness.  No lightheadedness.  No syncope.  Positive recent changes in medications. Eyes: No visual changes.  No blurred or double vision. ENT: No sore throat. No congestion or rhinorrhea. Cardiovascular: Denies chest pain. Denies palpitations. Respiratory: Denies shortness of breath.  No cough. Gastrointestinal: No abdominal pain.  No nausea, no vomiting.  No diarrhea.  No constipation. Genitourinary: Negative for dysuria.  Positive hematuria.  The patient does continue to urinate daily, small amount. Musculoskeletal: Negative for back pain. 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 Neurological: Negative for headaches. No focal numbness, tingling or weakness.     ____________________________________________   PHYSICAL EXAM:  VITAL SIGNS: ED Triage Vitals  Enc Vitals Group     BP 02/20/18 0638 (!) 86/75     Pulse Rate 02/20/18 0638 (!) 108     Resp 02/20/18 0638 (!) 22     Temp 02/20/18 0638 98.3 F (36.8 C)     Temp Source 02/20/18 0638 Oral     SpO2 02/20/18 0638 100 %     Weight 02/20/18 0640 256 lb 6.3 oz (116.3 kg)     Height 02/20/18 0640 5\' 11"  (1.803 m)     Head Circumference --      Peak Flow --      Pain Score --      Pain Loc --      Pain Edu? --      Excl. in [place]? --     Constitutional: The patient is alert, oriented and answers questions appropriately.  He is chronically ill-appearing but nontoxic. Eyes:  Conjunctivae are normal.  EOMI. No scleral icterus. Head: Atraumatic. Nose: No congestion/rhinnorhea. Mouth/Throat: Mucous membranes are mildly dry.  Neck: No stridor.  Supple.  Positive JVD.  No meningismus. Cardiovascular: Fast rate, regular rhythm. No murmurs, rubs or gallops.  On my examination, the patient's repeat blood pressure is 86/75. Respiratory: Normal respiratory effort.  No accessory muscle use or retractions. Lungs CTAB.  No wheezes, rales or ronchi. Gastrointestinal: Obese.  Soft, nontender and nondistended.  No guarding or rebound.  No peritoneal signs. Musculoskeletal: No LE edema. Neurologic:  A&Ox3.  Speech is clear.  Face and smile are symmetric.  EOMI.  Moves all extremities well. Skin:  Skin is warm, dry and intact.  Has nonblanching erythematous papules all over the bilateral feet consistent with vasculitis.   Psychiatric: Mood and affect are normal. Speech  and behavior are normal.  Normal judgement.  ____________________________________________   LABS (all labs ordered are listed, but only abnormal results are displayed)  Labs Reviewed  CBC WITH DIFFERENTIAL/PLATELET - Abnormal; Notable for the following components:      Result Value   WBC 12.6 (*)    RBC 3.83 (*)    Hemoglobin 11.2 (*)    HCT 35.4 (*)    MCHC 31.7 (*)    RDW 18.3 (*)    Neutro Abs 10.3 (*)    Basophils Absolute 0.2 (*)    All other components within normal limits  COMPREHENSIVE METABOLIC PANEL - Abnormal; Notable for the following components:   Sodium 133 (*)    Potassium 5.4 (*)    Chloride 95 (*)    Glucose, Bld 125 (*)    BUN 42 (*)    Creatinine, Ser 7.84 (*)    Calcium 8.4 (*)    Albumin 2.8 (*)    GFR calc non Af Amer 6 (*)    GFR calc Af Amer 7 (*)    All other components within normal limits  TROPONIN I - Abnormal; Notable for the following components:   Troponin I 0.03 (*)    All other components within normal limits  CULTURE, BLOOD (ROUTINE X 2)  CULTURE, BLOOD (ROUTINE  X 2)  URINE CULTURE  MAGNESIUM  LACTIC ACID, PLASMA  LACTIC ACID, PLASMA  URINALYSIS, COMPLETE (UACMP) WITH MICROSCOPIC   ____________________________________________  EKG  ED ECG REPORT I, Eula Listen, the attending physician, personally viewed and interpreted this ECG.   Date: 02/20/2018  EKG Time: 641  Rate: 108  Rhythm: sinus tachycardia; right bundle branch block with mildly widened QRS; no peaked T waves.  Axis: normal  Intervals:prolonged QTc  ST&T Change: No STEMI  This EKG is compared to 12/29/2017 and is unchanged in morphology.  The widened QRS is not new.  ____________________________________________  RADIOLOGY  Dg Chest Portable 1 View  Result Date: 02/20/2018 CLINICAL DATA:  Shortness of breath.  Dialysis EXAM: PORTABLE CHEST 1 VIEW COMPARISON:  10/19/2017 FINDINGS: Chronic cardiopericardial enlargement. Low volume chest with interstitial crowding. There is no edema, consolidation, effusion, or pneumothorax. IMPRESSION: 1. Low volume chest without acute finding. 2. Cardiomegaly. Electronically Signed   By: Monte Fantasia M.D.   On: 02/20/2018 07:17    ____________________________________________   PROCEDURES  Procedure(s) performed: None  Procedures  Critical Care performed: Yes, see critical care note(s) ____________________________________________   INITIAL IMPRESSION / ASSESSMENT AND PLAN / ED COURSE  Pertinent labs & imaging results that were available during my care of the patient were reviewed by me and considered in my medical decision making (see chart for details).  77 y.o. male with a history of ESRD on HD, recent treatment with antibiotics for colonic vesicular fistula presenting with hypotension and new onset of hematuria for the past 3 days.  Overall, the patient is hypotensive and tachycardic.  Will initiate a small bolus of fluid with 250 cc as the patient does have some pulmonary edema on his chest x-ray.  I am concerned  about worsening infection and a CT of the abdomen has been ordered.  The patient has no cough or cold symptoms that would be suggestive of a pulmonary infection or influenza.  A urinary infection is also possible.  Blood cultures have been obtained for possible bacteremia.  The patient does have low normal blood pressures even when he is in his normal state of health, but the recent clinical history  as well as associated tachycardia are concerning.  I have talked to the patient and his wife extensively about the plan, and we will get the results of the CT scan for final disposition; [facility] versus [facility] admission.  ----------------------------------------- 11:30 AM on 02/20/2018 -----------------------------------------  I reevaluated the patient who has improving clinical course.  He is no longer hypotensive, and his tachycardia has resolved.  He continues to remain afebrile.  He has no new symptoms at this time.  The patient's CT of the abdomen does show an increasing fluid collection at where his sigmoid colon and bladder have a fistula.  I have ordered vancomycin and Zosyn, as I do not results of microbiology that had focal findings from his prior aspirations.  Blood cultures have already been sent.  Lactic acid was reassuring at 1.6.  I have made a phone call to the general surgeon on-call, who will call me back when he is finished in the OR within the next 10-15 minutes.  The patient and his family have a significant preference to stay at [facility], rather than be transferred to [facility].  The patient does have an elevated troponin at 0.03; this will be trended.  He has not been having any chest pain and has no ischemic changes on his EKG.  Given that there is a fair chance for intervention or surgery for his primary condition today, antiplatelet agents are not immediately indicated.  The patient has not had dialysis, and does have some hyperkalemia with a potassium of 5.4.  Calcium has been ordered and his EKG  continues to remain stable.  ----------------------------------------- 1:25 PM on 02/20/2018 -----------------------------------------  The patient has been accepted for transfer at [facility].  At this time, [facility] has a large waiting list for transfer to their facility, so I have admitted to the patient to the hospitalist service at [facility].  I have contacted the nephrologist, who will have the patient dialyzed today.  In addition, I have talked with the general surgeon on-call who will evaluate the patient for possible aspiration versus drain placement in the now enlarged fluid collection around his fistula.  I have updated the patient and his family about the plan and they are in agreement.   CRITICAL CARE Performed by: Eula Listen   Total critical care time: 60 minutes  Critical care time was exclusive of separately billable procedures and treating other patients.  Critical care was necessary to treat or prevent imminent or life-threatening deterioration.  Critical care was time spent personally by me on the following activities: development of treatment plan with patient and/or surrogate as well as nursing, discussions with consultants, evaluation of patient's response to treatment, examination of patient, obtaining history from patient or surrogate, ordering and performing treatments and interventions, ordering and review of laboratory studies, ordering and review of radiographic studies, pulse oximetry and re-evaluation of patient's condition.   ____________________________________________  FINAL CLINICAL IMPRESSION(S) / ED DIAGNOSES  Final diagnoses:  None    Clinical Course as of Feb 20 1129  Mon Feb 20, 2018  3419 Glucose: (!) 125 [RN]  1007 After an initial 250 cc bolus, the patient did continue to have tachycardia and hypotension so a second has been ordered.  He is resting comfortably and we are awaiting him to finish his contrast to perform the CT abdomen  [AN]     Clinical Course User Index [AN] Eula Listen, MD [RN] Aldean Ast      NEW MEDICATIONS STARTED DURING THIS VISIT:  New  Prescriptions   No medications on file      Eula Listen, MD 02/20/18 1326

## 2018-02-20 NOTE — ED Notes (Signed)
Notified CT pt was done drinking contrast.

## 2018-02-20 NOTE — Progress Notes (Signed)
Patient had redness in his groin.  Dr Jerelyn Charles notified and order received for nystatin

## 2018-02-21 ENCOUNTER — Encounter: Payer: Self-pay | Admitting: Radiology

## 2018-02-21 ENCOUNTER — Inpatient Hospital Stay: Payer: Medicare Other

## 2018-02-21 LAB — C DIFFICILE QUICK SCREEN W PCR REFLEX
C DIFFICILE (CDIFF) TOXIN: NEGATIVE
C DIFFICLE (CDIFF) ANTIGEN: POSITIVE — AB

## 2018-02-21 LAB — BASIC METABOLIC PANEL
ANION GAP: 17 — AB (ref 5–15)
BUN: 49 mg/dL — AB (ref 6–20)
CHLORIDE: 97 mmol/L — AB (ref 101–111)
CO2: 19 mmol/L — ABNORMAL LOW (ref 22–32)
Calcium: 8.6 mg/dL — ABNORMAL LOW (ref 8.9–10.3)
Creatinine, Ser: 8.89 mg/dL — ABNORMAL HIGH (ref 0.61–1.24)
GFR calc Af Amer: 6 mL/min — ABNORMAL LOW (ref >60)
GFR, EST NON AFRICAN AMERICAN: 5 mL/min — AB (ref >60)
GLUCOSE: 79 mg/dL (ref 65–99)
POTASSIUM: 5.9 mmol/L — AB (ref 3.5–5.1)
Sodium: 133 mmol/L — ABNORMAL LOW (ref 135–145)

## 2018-02-21 LAB — CLOSTRIDIUM DIFFICILE BY PCR, REFLEXED: CDIFFPCR: NEGATIVE

## 2018-02-21 LAB — PROTIME-INR
INR: 2.1
Prothrombin Time: 23.4 seconds — ABNORMAL HIGH (ref 11.4–15.2)

## 2018-02-21 MED ORDER — PHYTONADIONE 5 MG PO TABS
5.0000 mg | ORAL_TABLET | Freq: Once | ORAL | Status: AC
  Administered 2018-02-21: 5 mg via ORAL
  Filled 2018-02-21: qty 1

## 2018-02-21 MED ORDER — SODIUM CHLORIDE 0.9 % IV SOLN
1.0000 g | INTRAVENOUS | Status: AC | PRN
  Administered 2018-02-22: 1 g via INTRAVENOUS
  Filled 2018-02-21: qty 1

## 2018-02-21 MED ORDER — FENTANYL CITRATE (PF) 100 MCG/2ML IJ SOLN
INTRAMUSCULAR | Status: AC
  Filled 2018-02-21: qty 4

## 2018-02-21 MED ORDER — MIDAZOLAM HCL 5 MG/5ML IJ SOLN
INTRAMUSCULAR | Status: AC
  Filled 2018-02-21: qty 10

## 2018-02-21 MED ORDER — METRONIDAZOLE 500 MG PO TABS
500.0000 mg | ORAL_TABLET | Freq: Three times a day (TID) | ORAL | Status: DC
  Administered 2018-02-21 – 2018-02-25 (×13): 500 mg via ORAL
  Filled 2018-02-21 (×15): qty 1

## 2018-02-21 NOTE — Care Management (Signed)
Amanda Morris dialysis liaison notified of admission.    

## 2018-02-21 NOTE — Progress Notes (Signed)
Pre HD Tx  

## 2018-02-21 NOTE — Progress Notes (Signed)
[facility]  ROUNDING NOTE   Subjective:   Family at bedside.   No dialysis yesterday. Plan on IR drain of abdominal abscess.    Objective:  Vital signs in last 24 hours:  Temp:  [97.5 F (36.4 C)-98.1 F (36.7 C)] 98.1 F (36.7 C) (02/26 1100) Pulse Rate:  [96-105] 96 (02/26 1100) Resp:  [20-28] 20 (02/26 1100) BP: (84-110)/(59-78) 110/70 (02/26 1100) SpO2:  [90 %-99 %] 96 % (02/26 1100)  Weight change:  Filed Weights   02/20/18 0640  Weight: 116.3 kg (256 lb 6.3 oz)    Intake/Output: I/O last 3 completed shifts: In: 1270 [P.O.:120; IV Piggyback:1150] Out: -    Intake/Output this shift:  No intake/output data recorded.  Physical Exam: General: NAD,   Head: Normocephalic, atraumatic. Moist oral mucosal membranes  Eyes: Anicteric, PERRL  Neck: Supple, trachea midline  Lungs:  Clear to auscultation  Heart: Regular rate and rhythm  Abdomen:  Soft, nontender,   Extremities: + peripheral edema.  Neurologic: Nonfocal, moving all four extremities  Skin: +erythema petechial lesions  Access: Left AVF    Basic Metabolic Panel: Recent Labs  Lab 02/20/18 0449 02/21/18 0413  NA 133* 133*  K 5.4* 5.9*  CL 95* 97*  CO2 23 19*  GLUCOSE 125* 79  BUN 42* 49*  CREATININE 7.84* 8.89*  CALCIUM 8.4* 8.6*  MG 2.3  --     Liver Function Tests: Recent Labs  Lab 02/20/18 0449  AST 32  ALT 22  ALKPHOS 66  BILITOT 1.0  PROT 7.2  ALBUMIN 2.8*   No results for input(s): LIPASE, AMYLASE in the last 168 hours. No results for input(s): AMMONIA in the last 168 hours.  CBC: Recent Labs  Lab 02/20/18 0449  WBC 12.6*  NEUTROABS 10.3*  HGB 11.2*  HCT 35.4*  MCV 92.5  PLT 232    Cardiac Enzymes: Recent Labs  Lab 02/20/18 0449 02/20/18 1146  TROPONINI 0.03* 0.03*    BNP: Invalid input(s): POCBNP  CBG: No results for input(s): GLUCAP in the last 168 hours.  Microbiology: Results for orders placed or performed during the hospital encounter  of 02/20/18  C difficile quick scan w PCR reflex     Status: Abnormal   Collection Time: 02/20/18  3:29 AM  Result Value Ref Range Status   C Diff antigen POSITIVE (A) NEGATIVE Final   C Diff toxin NEGATIVE NEGATIVE Final   C Diff interpretation Results are indeterminate. See PCR results.  Final    Comment: Performed at [facility], [address] [place], [place] [address]  C. Diff by PCR, Reflexed     Status: None   Collection Time: 02/20/18  3:29 AM  Result Value Ref Range Status   Toxigenic C. Difficile by PCR NEGATIVE NEGATIVE Final    Comment: Patient is colonized with non toxigenic C. difficile. May not need treatment unless significant symptoms are present. Performed at [facility], [address] [place], [place] [address]   Blood culture (routine x 2)     Status: None (Preliminary result)   Collection Time: 02/20/18  8:16 AM  Result Value Ref Range Status   Specimen Description BLOOD RIGHT ANTECUBITAL  Final   Special Requests   Final    BOTTLES DRAWN AEROBIC AND ANAEROBIC Blood Culture results may not be optimal due to an excessive volume of blood received in culture bottles   Culture   Final    NO GROWTH < 24 HOURS Performed at [facility], [address] [place], [place] [address]    Report Status PENDING  Incomplete  Blood culture (routine x 2)     Status: None (Preliminary result)   Collection Time: 02/20/18  9:20 AM  Result Value Ref Range Status   Specimen Description BLOOD BLOOD RIGHT ARM  Final   Special Requests   Final    BOTTLES DRAWN AEROBIC AND ANAEROBIC Blood Culture adequate volume   Culture   Final    NO GROWTH < 24 HOURS Performed at [facility], [address], [place], [place] [address]    Report Status PENDING  Incomplete  MRSA PCR Screening     Status: None   Collection Time: 02/20/18  6:02 PM  Result Value Ref Range Status   MRSA by PCR NEGATIVE NEGATIVE Final    Comment:        The GeneXpert  MRSA Assay (FDA approved for NASAL specimens only), is one component of a comprehensive MRSA colonization surveillance program. It is not intended to diagnose MRSA infection nor to guide or monitor treatment for MRSA infections. Performed at [facility], [address] [place], [place] [address]     Coagulation Studies: Recent Labs    02/20/18 1834 02/21/18 0940  LABPROT 22.5* 23.4*  INR 2.00 2.10    Urinalysis: No results for input(s): COLORURINE, LABSPEC, PHURINE, GLUCOSEU, HGBUR, BILIRUBINUR, KETONESUR, PROTEINUR, UROBILINOGEN, NITRITE, LEUKOCYTESUR in the last 72 hours.  Invalid input(s): APPERANCEUR    Imaging: Ct Abdomen Pelvis W Contrast  Result Date: 02/20/2018 CLINICAL DATA:  Colovesical fistula. Sigmoid diverticulitis with abscess positioned between the sigmoid colon and bladder. Prior aspiration under CT guidance on 01/09/2018 EXAM: CT ABDOMEN AND PELVIS WITH CONTRAST TECHNIQUE: Multidetector CT imaging of the abdomen and pelvis was performed using the standard protocol following bolus administration of intravenous contrast. CONTRAST:  154mL ISOVUE-300 IOPAMIDOL (ISOVUE-300) INJECTION 61% COMPARISON:  CT 01/09/2018, 01/05/2018 FINDINGS: Lower chest: Small bilateral pleural effusions increased from prior. Hepatobiliary: No focal hepatic lesion. No biliary duct dilatation. Gallbladder is normal. Common bile duct is normal. Pancreas: Pancreas is normal. No ductal dilatation. No pancreatic inflammation. Spleen: Normal spleen Adrenals/urinary tract: Bilateral renal cortical thinning no hydronephrosis. Ureters normal. Small a gas within the bladder. Stomach/Bowel: Stomach, small-bowel, cecum normal. Appendix normal. Moderate volume stool throughout the colon. Several diverticula through the sigmoid colon. Rectum normal. Fluid collection positioned between the dome of the bladder and the sigmoid colon measures 5.2 by 4.2 by 4.0 cm (volume = 46 cm^3) compared with 3.1 x  2.8 by 2.8 cm (volume = 13 cm^3) on CT 01/05/2018. There is fluid and gas within this pericolonic collection. No significant inflammation associated with the sigmoid colon. No intraperitoneal free air.  No intraperitoneal free fluid. Vascular/Lymphatic: Abdominal aorta is normal caliber with atherosclerotic calcification. There is no retroperitoneal or periportal lymphadenopathy. No pelvic lymphadenopathy. Reproductive: Prostate normal Other: No free fluid. Musculoskeletal: No aggressive osseous lesion. IMPRESSION: 1. Interval increase in volume of the rounded fluid collection/abscess/fistula track positioned between the sigmoid colon and the bladder. 2. Small amount of gas within the bladder. 3. New bilateral small effusions. Electronically Signed   By: Suzy Bouchard M.D.   On: 02/20/2018 11:15   Dg Chest Portable 1 View  Result Date: 02/20/2018 CLINICAL DATA:  Shortness of breath.  Dialysis EXAM: PORTABLE CHEST 1 VIEW COMPARISON:  10/19/2017 FINDINGS: Chronic cardiopericardial enlargement. Low volume chest with interstitial crowding. There is no edema, consolidation, effusion, or pneumothorax. IMPRESSION: 1. Low volume chest  without acute finding. 2. Cardiomegaly. Electronically Signed   By: Monte Fantasia M.D.   On: 02/20/2018 07:17     Medications:   . cefTAZidime (FORTAZ)  IV     . fentaNYL      . midazolam      . amiodarone  200 mg Oral Daily  . atorvastatin  10 mg Oral QPM  . calcium acetate  1,334 mg Oral TID WC  . ceftAZIDime  2 g Intravenous Q M,W,F-1800  . febuxostat  40 mg Oral QPM  . heparin  5,000 Units Subcutaneous Q8H  . levothyroxine  50 mcg Oral QAC breakfast  . metroNIDAZOLE  500 mg Oral Q8H  . midodrine  10 mg Oral TID AC  . montelukast  10 mg Oral QPM  . multivitamin  1 tablet Oral Daily  . nystatin   Topical BID  . pantoprazole  40 mg Oral QAC breakfast  . umeclidinium-vilanterol  1 puff Inhalation Daily   acetaminophen, albuterol, cefTAZidime (FORTAZ)  IV,  guaiFENesin **AND** dextromethorphan, diphenhydrAMINE, HYDROcodone-acetaminophen, ketotifen, ondansetron **OR** ondansetron (ZOFRAN) IV, pentafluoroprop-tetrafluoroeth  Assessment/ Plan:  Mr. TARRIN LEBOW is a 77 y.o. 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  1.  End-stage renal disease with hyperkalemia: MWF.  Dialysis last on Friday. Plan on hemodialysis later today and then resume MWF schedule.   2.  Anemia of chronic kidney disease: hemoglobin 11.2 - Mircer[facility] as outpatient.   3.  Secondary hyperparathyroidism:  - calcium acetate with meals.   4. Diarrhea: with intraabdominal abscess on IV vancomycin, metronidazole, ceftazidime C. Diff negative.  - Appreciate ID input.    LOS: 1 Jaicob Dia 2/26/20192:53 PM

## 2018-02-21 NOTE — Progress Notes (Signed)
[place] [facility] [place] NAME: Maurice Peterson    MR#:  [id number]  DATE OF BIRTH:  [birth year]  SUBJECTIVE:  CHIEF COMPLAINT:   Chief Complaint  Patient presents with  . Hypotension   - Dialysis patient, sent in for hypertension. Patient has known history of colovesical fistula and perivesicular abscess which she is noted to be increased in size based on CT.  REVIEW OF SYSTEMS:  Review of Systems  Constitutional: Positive for malaise/fatigue. Negative for chills and fever.  HENT: Negative for congestion, ear discharge, hearing loss and nosebleeds.   Eyes: Negative for blurred vision and double vision.  Respiratory: Negative for cough, shortness of breath and wheezing.   Cardiovascular: Positive for leg swelling. Negative for chest pain and palpitations.  Gastrointestinal: Negative for abdominal pain, constipation, diarrhea, nausea and vomiting.  Genitourinary: Negative for dysuria.  Musculoskeletal: Negative for myalgias.  Skin: Positive for rash.  Neurological: Negative for dizziness, speech change, focal weakness, seizures and headaches.    DRUG ALLERGIES:   Allergies  Allergen Reactions  . Nsaids Nausea And Vomiting    Anti-inflammatories.  . Amoxicillin-Pot Clavulanate Nausea And Vomiting    Has patient had a PCN reaction causing immediate rash, facial/tongue/throat swelling, SOB or lightheadedness with hypotension: No Has patient had a PCN reaction causing severe rash involving mucus membranes or skin necrosis: No Has patient had a PCN reaction that required hospitalization:Yes-vomiting blood Has patient had a PCN reaction occurring within the last 10 years: Yes If all of the above answers are "NO", then may proceed with Cephalosporin use.     VITALS:  Blood pressure 110/70, pulse 96, temperature 98.1 F (36.7 C), temperature source Oral, resp. rate 20, height 5\' 11"  (1.803 m), weight 116.3 kg (256 lb 6.3 oz), SpO2 96 %.  PHYSICAL  EXAMINATION:  Physical Exam  GENERAL:  77 y.o.-year-old patient lying in the bed with no acute distress.  EYES: Pupils equal, round, reactive to light and accommodation. No scleral icterus. Extraocular muscles intact.  HEENT: Head atraumatic, normocephalic. Oropharynx and nasopharynx clear.  NECK:  Supple, no jugular venous distention. No thyroid enlargement, no tenderness.  LUNGS: Normal breath sounds bilaterally, no wheezing, rales,rhonchi or crepitation. No use of accessory muscles of respiration.  CARDIOVASCULAR: S1, S2 normal. No  rubs, or gallops. 3/6 systolic murmur present ABDOMEN: Soft, nontender, nondistended. Bowel sounds present. No organomegaly or mass.  EXTREMITIES: No  cyanosis, or clubbing. 1+ pedal edema noted. Vasculitis rash on both feet Left forearm AV fistula noted NEUROLOGIC: Cranial nerves II through XII are intact. Muscle strength 5/5 in all extremities. Sensation intact. Gait not checked.  PSYCHIATRIC: The patient is alert and oriented x 3.  SKIN: No obvious rash, lesion, or ulcer.    LABORATORY PANEL:   CBC Recent Labs  Lab 02/20/18 0449  WBC 12.6*  HGB 11.2*  HCT 35.4*  PLT 232   ------------------------------------------------------------------------------------------------------------------  Chemistries  Recent Labs  Lab 02/20/18 0449 02/21/18 0413  NA 133* 133*  K 5.4* 5.9*  CL 95* 97*  CO2 23 19*  GLUCOSE 125* 79  BUN 42* 49*  CREATININE 7.84* 8.89*  CALCIUM 8.4* 8.6*  MG 2.3  --   AST 32  --   ALT 22  --   ALKPHOS 66  --   BILITOT 1.0  --    ------------------------------------------------------------------------------------------------------------------  Cardiac Enzymes Recent Labs  Lab 02/20/18 1146  TROPONINI 0.03*   ------------------------------------------------------------------------------------------------------------------  RADIOLOGY:  Ct Abdomen Pelvis W Contrast  Result Date: 02/20/2018 CLINICAL DATA:   Colovesical fistula. Sigmoid diverticulitis with abscess positioned between the sigmoid colon and bladder. Prior aspiration under CT guidance on 01/09/2018 EXAM: CT ABDOMEN AND PELVIS WITH CONTRAST TECHNIQUE: Multidetector CT imaging of the abdomen and pelvis was performed using the standard protocol following bolus administration of intravenous contrast. CONTRAST:  137mL ISOVUE-300 IOPAMIDOL (ISOVUE-300) INJECTION 61% COMPARISON:  CT 01/09/2018, 01/05/2018 FINDINGS: Lower chest: Small bilateral pleural effusions increased from prior. Hepatobiliary: No focal hepatic lesion. No biliary duct dilatation. Gallbladder is normal. Common bile duct is normal. Pancreas: Pancreas is normal. No ductal dilatation. No pancreatic inflammation. Spleen: Normal spleen Adrenals/urinary tract: Bilateral renal cortical thinning no hydronephrosis. Ureters normal. Small a gas within the bladder. Stomach/Bowel: Stomach, small-bowel, cecum normal. Appendix normal. Moderate volume stool throughout the colon. Several diverticula through the sigmoid colon. Rectum normal. Fluid collection positioned between the dome of the bladder and the sigmoid colon measures 5.2 by 4.2 by 4.0 cm (volume = 46 cm^3) compared with 3.1 x 2.8 by 2.8 cm (volume = 13 cm^3) on CT 01/05/2018. There is fluid and gas within this pericolonic collection. No significant inflammation associated with the sigmoid colon. No intraperitoneal free air.  No intraperitoneal free fluid. Vascular/Lymphatic: Abdominal aorta is normal caliber with atherosclerotic calcification. There is no retroperitoneal or periportal lymphadenopathy. No pelvic lymphadenopathy. Reproductive: Prostate normal Other: No free fluid. Musculoskeletal: No aggressive osseous lesion. IMPRESSION: 1. Interval increase in volume of the rounded fluid collection/abscess/fistula track positioned between the sigmoid colon and the bladder. 2. Small amount of gas within the bladder. 3. New bilateral small effusions.  Electronically Signed   By: Suzy Bouchard M.D.   On: 02/20/2018 11:15   Dg Chest Portable 1 View  Result Date: 02/20/2018 CLINICAL DATA:  Shortness of breath.  Dialysis EXAM: PORTABLE CHEST 1 VIEW COMPARISON:  10/19/2017 FINDINGS: Chronic cardiopericardial enlargement. Low volume chest with interstitial crowding. There is no edema, consolidation, effusion, or pneumothorax. IMPRESSION: 1. Low volume chest without acute finding. 2. Cardiomegaly. Electronically Signed   By: Monte Fantasia M.D.   On: 02/20/2018 07:17    EKG:   Orders placed or performed during the hospital encounter of 02/20/18  . ED EKG  . ED EKG  . EKG 12-Lead  . EKG 12-Lead  . EKG 12-Lead  . 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 Hypotension-could be sepsis. Due to colo- vesical fistula and abscess in the location. Previous drainage attempted by IR was unsuccessful. -CT yesterday showed increase in size of the fluid collection. -Interventional radiology to perform a percutaneous drainage today -Appreciate ID consult. He was started on vancomycin, ceftaz with dialysis and also Flagyl at [name] 10 days ago. We'll continue them for now as recommended until 03/04/18 -Appreciate surgical consult for the same -Patient does have known history of hypotension, he is on midodrine which we will continue  2. Atrial flutter-rate controlled. Continue amiodarone. On Toprol which is held due to hypotension for now -Eliquis held due to possible procedure  3. 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 Being followed by [facility] dermatology. Has had steroids in the past. In the process of starting immunosuppressants once  his infection is treated  4. COPD-stable  5. End-stage renal disease-appreciate nephrology consult. Patient on Monday,  Wednesday and Friday dialysis. Since missed his dialysis yesterday, will go for dialysis today.  6. DVT prophylaxis-subcutaneous heparin     All the records are reviewed and case discussed with Care Management/Social Workerr. Management plans discussed with the patient, family and they are in agreement.  CODE STATUS: Full Code  TOTAL TIME TAKING CARE OF THIS PATIENT: 39 minutes.   POSSIBLE D/C IN 2-3 DAYS, DEPENDING ON CLINICAL CONDITION.   Gladstone Lighter M.D on 02/21/2018 at 1:13 PM  Between 7am to 6pm - Pager - [phone]  After 6pm go to [email or web address] - password [email or web address] [facility]  Office  [phone]  CC: Primary care physician; Baxter Hire, MD

## 2018-02-21 NOTE — Progress Notes (Signed)
HD Tx started  

## 2018-02-21 NOTE — Progress Notes (Signed)
Post HD Tx  

## 2018-02-21 NOTE — Progress Notes (Signed)
Chief Complaint: Patient was seen in consultation today for diverticular abscess with known colovesical fistula at the request of Dr. Olean Ree  Referring Physician(s): Dr. Olean Ree  Supervising Physician: Markus Daft  Patient Status: [facility] - In-pt  History of Present Illness: Maurice Peterson is a 77 y.o. male with known diverticular abscess and colovesical fistula. Has been followed by surgical team at [facility] and placed on 4 week antibiotic course. Previous attempts to drain abscess were unsuccessful. However, upon this admission, CT shows the abscess to be enlarged. IR is asked again to try and place perc drain in the abscess. Chart, imaging, meds, labs reviewed. Normally on Eliquis for A.Fib. States last dose was 2/23. Has been NPO this am. Wife at bedside.  Past Medical History:  Diagnosis Date  . Arthritis   . Atrial flutter ([place])    a. s/p DCCV 07/2017 briefly successful; b. amio and warfarin; c. CHADS2VASc => 5 (CHF, HTN, age x 2, vascular disease);  [name] CAD (coronary artery disease)    a. remote inferior MI s/p stenting; b. Myoview in 10/2016 with normal perfusion with an EF of 46%  . CHF (congestive heart failure) ([place])   . COPD (chronic obstructive pulmonary disease) ([place])   . ESRD (end stage renal disease) on dialysis ([place])    a. HD MWF  . GERD (gastroesophageal reflux disease)   . Hyperlipidemia   . Hypertension   . Ischemic cardiomyopathy    a. TTE 05/2017: EF 35-40%, unable to exlcude RWMA, mild MR, mildly dilated RV  . Prostate cancer ([place])   . Renal insufficiency   . Shortness of breath dyspnea     Past Surgical History:  Procedure Laterality Date  . A/V SHUNT INTERVENTION Left 10/24/2017   Procedure: A/V SHUNT INTERVENTION;  Surgeon: Algernon Huxley, MD;  Location: [place] [facility];  Service: Cardiovascular;  Laterality: Left;  . CARDIOVERSION N/A 07/26/2017   Procedure: Cardioversion;  Surgeon: Corey Skains, MD;  Location: [facility];  Service:  Cardiovascular;  Laterality: N/A;  . CARDIOVERSION N/A 08/18/2017   Procedure: CARDIOVERSION;  Surgeon: Corey Skains, MD;  Location: [facility];  Service: Cardiovascular;  Laterality: N/A;  . CARDIOVERSION N/A 10/21/2017   Procedure: CARDIOVERSION;  Surgeon: Wellington Hampshire, MD;  Location: [facility];  Service: Cardiovascular;  Laterality: N/A;  . CARDIOVERSION N/A 11/25/2017   Procedure: CARDIOVERSION;  Surgeon: Wellington Hampshire, MD;  Location: [facility];  Service: Cardiovascular;  Laterality: N/A;  . CATARACT EXTRACTION W/PHACO Right 08/20/2015   Procedure: CATARACT EXTRACTION PHACO AND INTRAOCULAR LENS PLACEMENT ([place]);  Surgeon: Leandrew Koyanagi, MD;  Location: [place];  Service: Ophthalmology;  Laterality: Right;  . CATARACT EXTRACTION W/PHACO Left 09/10/2015   Procedure: CATARACT EXTRACTION PHACO AND INTRAOCULAR LENS PLACEMENT ([facility]);  Surgeon: Leandrew Koyanagi, MD;  Location: [place];  Service: Ophthalmology;  Laterality: Left;  . CORONARY STENT PLACEMENT    . 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 PERIPHERAL VASCULAR CATHETERIZATION Left 05/20/2015   Procedure: A/V Shuntogram/Fistulagram;  Surgeon: Katha Cabal, MD;  Location: [place] [facility];  Service: Cardiovascular;  Laterality: Left;  . PERIPHERAL VASCULAR CATHETERIZATION N/A 05/20/2015   Procedure: A/V Shunt Intervention;  Surgeon: Katha Cabal, MD;  Location: [place] [facility];  Service: Cardiovascular;  Laterality: N/A;  . PERIPHERAL VASCULAR CATHETERIZATION Left 08/05/2015   Procedure: A/V Shuntogram/Fistulagram;  Surgeon: Katha Cabal, MD;  Location: [place] [facility];  Service: Cardiovascular;  Laterality: Left;  . PERIPHERAL VASCULAR CATHETERIZATION N/A 08/05/2015   Procedure: A/V Shunt Intervention;  Surgeon: Katha Cabal, MD;  Location: [place] [facility];  Service: Cardiovascular;  Laterality: N/A;  . PERIPHERAL VASCULAR CATHETERIZATION Left 01/06/2016    Procedure: A/V Shuntogram/Fistulagram;  Surgeon: Katha Cabal, MD;  Location: [place] [facility];  Service: Cardiovascular;  Laterality: Left;  . PERIPHERAL VASCULAR CATHETERIZATION N/A 01/06/2016   Procedure: A/V Shunt Intervention;  Surgeon: Katha Cabal, MD;  Location: [place] [facility];  Service: Cardiovascular;  Laterality: N/A;  . PERIPHERAL VASCULAR CATHETERIZATION N/A 12/13/2016   Procedure: Dialysis/Perma Catheter Removal;  Surgeon: Algernon Huxley, MD;  Location: [place] [facility];  Service: Cardiovascular;  Laterality: N/A;  . PERIPHERAL VASCULAR THROMBECTOMY Left 01/02/2018   Procedure: PERIPHERAL VASCULAR THROMBECTOMY;  Surgeon: Algernon Huxley, MD;  Location: [place] [facility];  Service: Cardiovascular;  Laterality: Left;    Allergies: Nsaids and Amoxicillin-pot clavulanate  Medications:  Current Facility-Administered Medications:  .  acetaminophen (TYLENOL) tablet 1,000 mg, 1,000 mg, Oral, Daily PRN, Salary, Montell D, MD, 1,000 mg at 02/20/18 1750 .  albuterol (PROVENTIL) (2.5 MG/3ML) 0.083% nebulizer solution 2.5 mg, 2.5 mg, Nebulization, Q6H PRN, Salary, Montell D, MD .  amiodarone (PACERONE) tablet 200 mg, 200 mg, Oral, Daily, Salary, Montell D, MD, 200 mg at 02/20/18 1752 .  atorvastatin (LIPITOR) tablet 10 mg, 10 mg, Oral, QPM, Salary, Montell D, MD, 10 mg at 02/20/18 1752 .  calcium acetate (PHOSLO) capsule 1,334 mg, 1,334 mg, Oral, TID WC, Salary, Montell D, MD, 1,334 mg at 02/20/18 1753 .  ceftAZIDime (FORTAZ) IVPB 2 g, 2 g, Intravenous, Q M,W,F-1800, Salary, Montell D, MD, 2 g at 02/20/18 1941 .  guaiFENesin (MUCINEX) 12 hr tablet 600 mg, 600 mg, Oral, QHS PRN **AND** dextromethorphan (DELSYM) 30 MG/5ML liquid 30 mg, 30 mg, Oral, QHS PRN, Salary, Montell D, MD .  diphenhydrAMINE (BENADRYL) capsule 25 mg, 25 mg, Oral, QHS PRN, Salary, Montell D, MD, 25 mg at 02/20/18 2056 .  febuxostat (ULORIC) tablet 40 mg, 40 mg, Oral, QPM, Salary, Montell D, MD, 40 mg at  02/20/18 1751 .  heparin injection 5,000 Units, 5,000 Units, Subcutaneous, Q8H, Salary, Montell D, MD, Stopped at 02/21/18 0600 .  HYDROcodone-acetaminophen (NORCO/VICODIN) 5-325 MG per tablet 1-2 tablet, 1-2 tablet, Oral, Q4H PRN, Salary, Montell D, MD .  ketotifen (ZADITOR) 0.025 % ophthalmic solution 1 drop, 1 drop, Both Eyes, BID PRN, Salary, Montell D, MD .  levothyroxine (SYNTHROID, LEVOTHROID) tablet 50 mcg, 50 mcg, Oral, QAC breakfast, Salary, Montell D, MD .  metroNIDAZOLE (FLAGYL) tablet 500 mg, 500 mg, Oral, Q8H, Leonel Ramsay, MD .  midodrine (PROAMATINE) tablet 10 mg, 10 mg, Oral, TID AC, Salary, Montell D, MD, 10 mg at 02/20/18 1751 .  montelukast (SINGULAIR) tablet 10 mg, 10 mg, Oral, QPM, Salary, Montell D, MD, 10 mg at 02/20/18 1752 .  multivitamin (RENA-VIT) tablet 1 tablet, 1 tablet, Oral, Daily, Salary, Montell D, MD, 1 tablet at 02/20/18 1752 .  nystatin (MYCOSTATIN/NYSTOP) topical powder, , Topical, BID, Salary, Montell D, MD .  ondansetron (ZOFRAN) tablet 4 mg, 4 mg, Oral, Q6H PRN **OR** ondansetron (ZOFRAN) injection 4 mg, 4 mg, Intravenous, Q6H PRN, Salary, Montell D, MD .  pantoprazole (PROTONIX) EC tablet 40 mg, 40 mg, Oral, QAC breakfast, Salary, Montell D, MD .  patiromer (VELTASSA) packet 8.4 g, 8.4 g, Oral, Daily, Salary, Montell D, MD, 8.4 g at 02/20/18 1758 .  pentafluoroprop-tetrafluoroeth (GEBAUERS)  aerosol, , Topical, PRN, Salary, Montell D, MD .  umeclidinium-vilanterol (ANORO ELLIPTA) 62.5-25 MCG/INH 1 puff, 1 puff, Inhalation, Daily, Salary, Montell D, MD  Facility-Administered Medications Ordered in Other Encounters:  .  0.9 %  sodium chloride infusion, , , Continuous PRN, Michelet, Stephanie, CRNA    Family History  Problem Relation Age of Onset  . Liver disease Mother   . Heart disease Mother   . Hypertension Mother   . Pancreatic cancer Father   . Kidney cancer Neg Hx   . Kidney disease Neg Hx   . Prostate cancer Neg Hx     Social  History   Socioeconomic History  . Marital status: Married    Spouse name: None  . Number of children: None  . Years of education: None  . Highest education level: None  Social Needs  . Financial resource strain: None  . Food insecurity - worry: None  . Food insecurity - inability: None  . Transportation needs - medical: None  . Transportation needs - non-medical: None  Occupational History  . None  Tobacco Use  . Smoking status: Former Smoker    Packs/day: 1.00    Years: 30.00    Pack years: 30.00    Types: Cigarettes    Last attempt to quit: 05/19/2009    Years since quitting: 8.7  . Smokeless tobacco: Never Used  Substance and Sexual Activity  . Alcohol use: No  . Drug use: No  . Sexual activity: Yes  Other Topics Concern  . None  Social History Narrative  . None     Review of Systems: A 12 point ROS discussed and pertinent positives are indicated in the HPI above.  All other systems are negative.  Review of Systems  Vital Signs: BP 104/69 (BP Location: Right Arm)   Pulse 98   Temp 97.6 F (36.4 C) (Oral)   Resp (!) 28   Ht 5\' 11"  (1.803 m)   Wt 256 lb 6.3 oz (116.3 kg)   SpO2 99%   BMI 35.76 kg/m   Physical Exam  Constitutional: He is oriented to person, place, and time. He appears well-developed. No distress.  HENT:  Head: Normocephalic.  Mouth/Throat: Oropharynx is clear and moist.  Cardiovascular: Normal heart sounds.  Irreg irreg  Pulmonary/Chest: Effort normal and breath sounds normal. No respiratory distress.  Abdominal: Soft. He exhibits no distension.  Mildly tender suprapubic region  Neurological: He is alert and oriented to person, place, and time.  Skin: Skin is warm and dry. He is not diaphoretic.  Psychiatric: He has a normal mood and affect.      Imaging: Ct Abdomen Pelvis W Contrast  Result Date: 02/20/2018 CLINICAL DATA:  Colovesical fistula. Sigmoid diverticulitis with abscess positioned between the sigmoid colon and bladder.  Prior aspiration under CT guidance on 01/09/2018 EXAM: CT ABDOMEN AND PELVIS WITH CONTRAST TECHNIQUE: Multidetector CT imaging of the abdomen and pelvis was performed using the standard protocol following bolus administration of intravenous contrast. CONTRAST:  142mL ISOVUE-300 IOPAMIDOL (ISOVUE-300) INJECTION 61% COMPARISON:  CT 01/09/2018, 01/05/2018 FINDINGS: Lower chest: Small bilateral pleural effusions increased from prior. Hepatobiliary: No focal hepatic lesion. No biliary duct dilatation. Gallbladder is normal. Common bile duct is normal. Pancreas: Pancreas is normal. No ductal dilatation. No pancreatic inflammation. Spleen: Normal spleen Adrenals/urinary tract: Bilateral renal cortical thinning no hydronephrosis. Ureters normal. Small a gas within the bladder. Stomach/Bowel: Stomach, small-bowel, cecum normal. Appendix normal. Moderate volume stool throughout the colon. Several diverticula through the sigmoid  colon. Rectum normal. Fluid collection positioned between the dome of the bladder and the sigmoid colon measures 5.2 by 4.2 by 4.0 cm (volume = 46 cm^3) compared with 3.1 x 2.8 by 2.8 cm (volume = 13 cm^3) on CT 01/05/2018. There is fluid and gas within this pericolonic collection. No significant inflammation associated with the sigmoid colon. No intraperitoneal free air.  No intraperitoneal free fluid. Vascular/Lymphatic: Abdominal aorta is normal caliber with atherosclerotic calcification. There is no retroperitoneal or periportal lymphadenopathy. No pelvic lymphadenopathy. Reproductive: Prostate normal Other: No free fluid. Musculoskeletal: No aggressive osseous lesion. IMPRESSION: 1. Interval increase in volume of the rounded fluid collection/abscess/fistula track positioned between the sigmoid colon and the bladder. 2. Small amount of gas within the bladder. 3. New bilateral small effusions. Electronically Signed   By: Suzy Bouchard M.D.   On: 02/20/2018 11:15   Dg Chest Portable 1  View  Result Date: 02/20/2018 CLINICAL DATA:  Shortness of breath.  Dialysis EXAM: PORTABLE CHEST 1 VIEW COMPARISON:  10/19/2017 FINDINGS: Chronic cardiopericardial enlargement. Low volume chest with interstitial crowding. There is no edema, consolidation, effusion, or pneumothorax. IMPRESSION: 1. Low volume chest without acute finding. 2. Cardiomegaly. Electronically Signed   By: Monte Fantasia M.D.   On: 02/20/2018 07:17    Labs:  CBC: Recent Labs    12/03/17 0412 12/18/17 0622 01/09/18 1503 02/20/18 0449  WBC 7.5 9.9 12.3* 12.6*  HGB 10.4* 11.5* 11.7* 11.2*  HCT 31.1* 35.8* 36.4* 35.4*  PLT 242 203 234 232    COAGS: Recent Labs    06/17/17 1430  10/19/17 1713  10/24/17 1732  10/26/17 0823 11/22/17 1045 01/09/18 1503 02/20/18 1834  INR 1.06   < > 2.57   < >  --    < > 1.44 1.3* 1.21 2.00  APTT 34  --  37*  --  32  --   --   --   --  39*   < > = values in this interval not displayed.    BMP: Recent Labs    12/18/17 0622 01/02/18 1410 01/09/18 1503 02/20/18 0449 02/21/18 0413  NA 137  --  138 133* 133*  K 4.7 5.8* 4.4 5.4* 5.9*  CL 95*  --  95* 95* 97*  CO2 28  --  27 23 19*  GLUCOSE 89  --  105* 125* 79  BUN 37*  --  25* 42* 49*  CALCIUM 8.9  --  8.7* 8.4* 8.6*  CREATININE 7.81*  --  4.16* 7.84* 8.89*  GFRNONAA 6*  --  13* 6* 5*  GFRAA 7*  --  15* 7* 6*    LIVER FUNCTION TESTS: Recent Labs    10/19/17 1446  10/26/17 1018 12/18/17 0622 01/09/18 1503 02/20/18 0449  BILITOT 1.1  --   --  0.9 0.8 1.0  AST 16  --   --  32 45* 32  ALT 24  --   --  23 31 22   ALKPHOS 45  --   --  60 49 66  PROT 6.9  --   --  7.3 7.8 7.2  ALBUMIN 3.7   < > 3.4* 3.4* 3.7 2.8*   < > = values in this interval not displayed.    TUMOR MARKERS: No results for input(s): AFPTM, CEA, CA199, CHROMGRNA in the last 8760 hours.  Assessment and Plan: Diverticular abscess with chronic colovesical fistula Plan for perc drainage of the abscess. Labs reviewed, INR elevated  yesterday. Will check again this am.  Risks and benefits discussed with the patient including bleeding, infection, damage to adjacent structures, bowel perforation/fistula connection, and sepsis.  All of the patient's questions were answered, patient is agreeable to proceed. Consent signed and in chart.    Thank you for this interesting consult.  I greatly enjoyed meeting Maurice Peterson and look forward to participating in their care.  A copy of this report was sent to the requesting provider on this date.  Electronically Signed: Ascencion Dike, PA-C 02/21/2018, 9:13 AM   I spent a total of 20 minutes in face to face in clinical consultation, greater than 50% of which was counseling/coordinating care for abscess drainage

## 2018-02-21 NOTE — Consult Note (Addendum)
Pharmacy Antibiotic Note  Maurice Peterson is a 77 y.o. male admitted on 02/20/2018 with intraabdominal abscess, with colovesicular fistula .  Pharmacy has been consulted for vancomycin dosing. Pt was [facility] to [facility] and d/c on vancomycin, ceftazidime, and po flagyl until 3/19 when he is to follow up with [facility]. Pt was admitted for hypotension. Pt on transfer list to [facility], but currently no bed available.  Plan: Pt was on vancomycin 1500mg  and ceftaz 2g after HD and metronidazole 500mg  TID Continue ceftaz and metronidazole as above. Pt will need an additional ceftaz dose today after HD. Will order a 1 time 1g dose for tonight and resume 2g AFTER HD tomorrow Pt was given 2g of vancomycin in the ED, did not receive HD yeterday. Plans to get HD today and then back to normal MWF schedule tomorrow. I will draw a random vanc level today AFTER HD to determine if pt needs a supplemental dose after HD today since pt received extra dose yesterday. Follow up level tonight  Height: 5\' 11"  (180.3 cm) Weight: 256 lb 6.3 oz (116.3 kg) IBW/kg (Calculated) : 75.3  Temp (24hrs), Avg:97.7 F (36.5 C), Min:97.5 F (36.4 C), Max:97.8 F (36.6 C)  Recent Labs  Lab 02/20/18 0449 02/20/18 0816 02/21/18 0413  WBC 12.6*  --   --   CREATININE 7.84*  --  8.89*  LATICACIDVEN  --  1.6  --     Estimated Creatinine Clearance: 9.2 mL/min (A) (by C-G formula based on SCr of 8.89 mg/dL (H)).    Allergies  Allergen Reactions  . Nsaids Nausea And Vomiting    Anti-inflammatories.  . Amoxicillin-Pot Clavulanate Nausea And Vomiting    Has patient had a PCN reaction causing immediate rash, facial/tongue/throat swelling, SOB or lightheadedness with hypotension: No Has patient had a PCN reaction causing severe rash involving mucus membranes or skin necrosis: No Has patient had a PCN reaction that required hospitalization:Yes-vomiting blood Has patient had a PCN reaction occurring within the last 10 years: Yes If all of the  above answers are "NO", then may proceed with Cephalosporin use.     Antimicrobials this admission: vancomycin  >>  ceftaz >>  Metronidazole >> cipro 2/25>>2/26  Dose adjustments this admission:   Microbiology results: 2/25 BCx: NG <24hr 2/25 MRSA PCR: neg 2/25 cdiff antigen positive, toxin neg- PCR pending  Thank you for allowing pharmacy to be a part of this patient's care.  Ramond Dial, Pharm.D, BCPS Clinical Pharmacist  02/21/2018 8:22 AM

## 2018-02-21 NOTE — Progress Notes (Addendum)
   02/21/18 1636  What Happened  Was fall witnessed? Yes  Who witnessed fall? Maurice Peterson, Maurice Peterson  Patients activity before fall ambulating-unassisted  Point of contact other (comment) (assisted fall)  Was patient injured? No  Follow Up  MD notified yes (Dr. Tressia Miners)  Family notified Yes-comment  Time family notified 1620 (family present during )  Additional tests No  Progress note created (see row info) Yes  Called patients room to notify him of diet added and was told by wife that patient had a fall when she was trying to assist him to [facility]. She alerted me that staff were present in the room at this point trying to assist him to the bed. Patient had no injuries relating to fall, he was SOB with sats at 98%. Educated wife to now call for assistance with toileting patient, bed alarm turned on and patient re-educated on wearing non-skid socks as they were not on his feet.  Upon getting patient off floor NT notified nurse that there was blood in stool, assessment of stool had red splatters in the toilet of blood and a blood clot a bit larger than a golf ball. Notified Dr. Tressia Miners notified of stool with blood in it, no new orders given.

## 2018-02-21 NOTE — Progress Notes (Signed)
HD Tx ended- Pt stable, denies complaints. Net UF 2069mL

## 2018-02-22 ENCOUNTER — Telehealth: Payer: Self-pay | Admitting: Internal Medicine

## 2018-02-22 LAB — CBC
HCT: 33.2 % — ABNORMAL LOW (ref 40.0–52.0)
Hemoglobin: 10.6 g/dL — ABNORMAL LOW (ref 13.0–18.0)
MCH: 29.3 pg (ref 26.0–34.0)
MCHC: 32 g/dL (ref 32.0–36.0)
MCV: 91.5 fL (ref 80.0–100.0)
Platelets: 219 10*3/uL (ref 150–440)
RBC: 3.63 MIL/uL — ABNORMAL LOW (ref 4.40–5.90)
RDW: 18 % — ABNORMAL HIGH (ref 11.5–14.5)
WBC: 11 10*3/uL — AB (ref 3.8–10.6)

## 2018-02-22 LAB — BASIC METABOLIC PANEL
Anion gap: 17 — ABNORMAL HIGH (ref 5–15)
BUN: 42 mg/dL — AB (ref 6–20)
CALCIUM: 8.4 mg/dL — AB (ref 8.9–10.3)
CHLORIDE: 97 mmol/L — AB (ref 101–111)
CO2: 21 mmol/L — ABNORMAL LOW (ref 22–32)
Creatinine, Ser: 7.65 mg/dL — ABNORMAL HIGH (ref 0.61–1.24)
GFR calc non Af Amer: 6 mL/min — ABNORMAL LOW (ref >60)
GFR, EST AFRICAN AMERICAN: 7 mL/min — AB (ref >60)
Glucose, Bld: 81 mg/dL (ref 65–99)
Potassium: 5.7 mmol/L — ABNORMAL HIGH (ref 3.5–5.1)
SODIUM: 135 mmol/L (ref 135–145)

## 2018-02-22 LAB — URINE CULTURE

## 2018-02-22 LAB — VANCOMYCIN, RANDOM: Vancomycin Rm: 42

## 2018-02-22 LAB — PROTIME-INR
INR: 2.11
Prothrombin Time: 23.5 seconds — ABNORMAL HIGH (ref 11.4–15.2)

## 2018-02-22 MED ORDER — TRIAMCINOLONE ACETONIDE 0.1 % EX OINT
TOPICAL_OINTMENT | Freq: Two times a day (BID) | CUTANEOUS | Status: DC
  Administered 2018-02-22 – 2018-02-25 (×7): via TOPICAL
  Filled 2018-02-22: qty 15

## 2018-02-22 MED ORDER — IPRATROPIUM-ALBUTEROL 0.5-2.5 (3) MG/3ML IN SOLN
3.0000 mL | Freq: Once | RESPIRATORY_TRACT | Status: AC
  Administered 2018-02-22: 3 mL via RESPIRATORY_TRACT
  Filled 2018-02-22: qty 3

## 2018-02-22 MED ORDER — GUAIFENESIN ER 600 MG PO TB12
600.0000 mg | ORAL_TABLET | Freq: Two times a day (BID) | ORAL | Status: DC
  Administered 2018-02-22 – 2018-02-25 (×6): 600 mg via ORAL
  Filled 2018-02-22 (×6): qty 1

## 2018-02-22 MED ORDER — PHYTONADIONE 5 MG PO TABS
5.0000 mg | ORAL_TABLET | Freq: Once | ORAL | Status: AC
  Administered 2018-02-22: 5 mg via ORAL
  Filled 2018-02-22: qty 1

## 2018-02-22 MED ORDER — SODIUM CHLORIDE 0.9 % IV SOLN
1.0000 g | INTRAVENOUS | Status: AC | PRN
  Administered 2018-02-22: 1 g via INTRAVENOUS
  Filled 2018-02-22: qty 1

## 2018-02-22 NOTE — Progress Notes (Signed)
Post HD assessment. Pt tolerated tx well without any c/o or complications. Net UF 1044, goal met.

## 2018-02-22 NOTE — Progress Notes (Signed)
Dr. Marcille Blanco notified of recent fall. Informed to continue to monitor patient and to call or page if patient's condition declines.

## 2018-02-22 NOTE — Progress Notes (Addendum)
[facility] [place] NAME: Maurice Peterson    MR#:  [id number]  DATE OF BIRTH:  1941-11-22  SUBJECTIVE:  CHIEF COMPLAINT:   Chief Complaint  Patient presents with  . Hypotension   - INR still at 2.1, so IR procedure canceled- vitamin K again today - had dialysis today, some cough, congestion and dyspnea  REVIEW OF SYSTEMS:  Review of Systems  Constitutional: Positive for malaise/fatigue. Negative for chills and fever.  HENT: Negative for congestion, ear discharge, hearing loss and nosebleeds.   Eyes: Negative for blurred vision and double vision.  Respiratory: Positive for cough and shortness of breath. Negative for wheezing.   Cardiovascular: Positive for leg swelling. Negative for chest pain and palpitations.  Gastrointestinal: Negative for abdominal pain, constipation, diarrhea, nausea and vomiting.  Genitourinary: Negative for dysuria.  Musculoskeletal: Negative for myalgias.  Skin: Positive for rash.  Neurological: Negative for dizziness, speech change, focal weakness, seizures and headaches.    DRUG ALLERGIES:   Allergies  Allergen Reactions  . Nsaids Nausea And Vomiting    Anti-inflammatories.  . Amoxicillin-Pot Clavulanate Nausea And Vomiting    Has patient had a PCN reaction causing immediate rash, facial/tongue/throat swelling, SOB or lightheadedness with hypotension: No Has patient had a PCN reaction causing severe rash involving mucus membranes or skin necrosis: No Has patient had a PCN reaction that required hospitalization:Yes-vomiting blood Has patient had a PCN reaction occurring within the last 10 years: Yes If all of the above answers are "NO", then may proceed with Cephalosporin use.     VITALS:  Blood pressure (!) 90/55, pulse (!) 102, temperature 97.9 F (36.6 C), temperature source Oral, resp. rate 18, height 5\' 11"  (1.803 m), weight 115.1 kg (253 lb 12 oz), SpO2 93 %.  PHYSICAL EXAMINATION:  Physical  Exam  GENERAL:  77 y.o.-year-old patient lying in the bed with no acute distress.  EYES: Pupils equal, round, reactive to light and accommodation. No scleral icterus. Extraocular muscles intact.  HEENT: Head atraumatic, normocephalic. Oropharynx and nasopharynx clear.  NECK:  Supple, no jugular venous distention. No thyroid enlargement, no tenderness.  LUNGS: Normal breath sounds bilaterally, scattered wheezing noted, no  rales,rhonchi or crepitation. No use of accessory muscles of respiration.  CARDIOVASCULAR: S1, S2 normal. No  rubs, or gallops. 3/6 systolic murmur present ABDOMEN: Soft, nontender, nondistended. Bowel sounds present. No organomegaly or mass.  EXTREMITIES: No  cyanosis, or clubbing. 1+ pedal edema noted. Vasculitis rash on both feet Left forearm AV fistula noted NEUROLOGIC: Cranial nerves II through XII are intact. Muscle strength 5/5 in all extremities. Sensation intact. Gait not checked.  PSYCHIATRIC: The patient is alert and oriented x 3.  SKIN: No obvious rash, lesion, or ulcer.    LABORATORY PANEL:   CBC Recent Labs  Lab 02/22/18 0641  WBC 11.0*  HGB 10.6*  HCT 33.2*  PLT 219   ------------------------------------------------------------------------------------------------------------------  Chemistries  Recent Labs  Lab 02/20/18 0449  02/22/18 0641  NA 133*   < > 135  K 5.4*   < > 5.7*  CL 95*   < > 97*  CO2 23   < > 21*  GLUCOSE 125*   < > 81  BUN 42*   < > 42*  CREATININE 7.84*   < > 7.65*  CALCIUM 8.4*   < > 8.4*  MG 2.3  --   --   AST 32  --   --   ALT 22  --   --  ALKPHOS 66  --   --   BILITOT 1.0  --   --    < > = values in this interval not displayed.   ------------------------------------------------------------------------------------------------------------------  Cardiac Enzymes Recent Labs  Lab 02/20/18 1146  TROPONINI 0.03*    ------------------------------------------------------------------------------------------------------------------  RADIOLOGY:  No results found.  EKG:   Orders placed or performed during the hospital encounter of 02/20/18  . ED EKG  . ED EKG  . EKG 12-Lead  . EKG 12-Lead  . EKG 12-Lead  . 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 Hypotension-could be sepsis. Due to colo- vesical fistula and abscess in the location. Previous drainage attempted by IR was unsuccessful. -CT yesterday showed increase in size of the fluid collection. -Interventional radiology to perform a percutaneous drainage once INR is improved -Appreciate ID consult. He was started on vancomycin, ceftaz with dialysis and also Flagyl at [facility] 10 days ago. We'll continue them for now as recommended until 03/04/18 -Appreciate surgical consult for the same -Patient does have known history of hypotension, he is on midodrine which we will continue  2. Atrial flutter-rate controlled. Continue amiodarone. On Toprol which is held due to hypotension for now -Eliquis held due to possible procedure  3. 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 Being followed by [facility] dermatology. Has had steroids in the past. In the process of starting immunosuppressants once his infection is treated  4. COPD-stable, nebs prn Cough meds added, if no improvement- CXR tomorrow  5. End-stage renal disease-appreciate nephrology consult. Patient on Monday, Wednesday and Friday dialysis.  - last dialysis today per schedule  6. DVT prophylaxis-subcutaneous heparin  7. Vasculitis rash- triamcilone ointment bid     All the  records are reviewed and case discussed with Care Management/Social Workerr. Management plans discussed with the patient, family and they are in agreement.  CODE STATUS: Full Code  TOTAL TIME TAKING CARE OF THIS PATIENT: 39 minutes.   POSSIBLE D/C IN 2-3 DAYS, DEPENDING ON CLINICAL CONDITION.   Gladstone Lighter M.D on 02/22/2018 at 3:54 PM  Between 7am to 6pm - Pager - [phone]  After 6pm go to [email or web address] - password [email or web address] [facility]  Office  [phone]  CC: Primary care physician; Baxter Hire, MD

## 2018-02-22 NOTE — Progress Notes (Signed)
[facility] INFECTIOUS DISEASE PROGRESS NOTE Date of Admission:  02/20/2018     ID: [name] is a 77 y.o. male with  colovesicular fistula Active Problems:   Diverticulitis of large intestine with abscess without bleeding   Colovesical fistula   Subjective: Had to cancel IR procedure due to elevated INR  ROS  Eleven systems are reviewed and negative except per hpi  Medications:  Antibiotics Given (last 72 hours)    Date/Time Action Medication Dose Rate   02/20/18 1154 New Bag/Given   piperacillin-tazobactam (ZOSYN) IVPB 3.375 g 3.375 g 100 mL/hr   02/20/18 1244 New Bag/Given   vancomycin (VANCOCIN) 2,000 mg in sodium chloride 0.9 % 500 mL IVPB 2,000 mg 250 mL/hr   02/20/18 1812 New Bag/Given   ciprofloxacin (CIPRO) IVPB 400 mg 400 mg 200 mL/hr   02/20/18 1941 Given   ceftAZIDime (FORTAZ) IVPB 2 g 2 g 100 mL/hr   02/21/18 1205 Given  [had to verify with MD if patient could have being NPO]   metroNIDAZOLE (FLAGYL) tablet 500 mg 500 mg    02/21/18 2304 Given   metroNIDAZOLE (FLAGYL) tablet 500 mg 500 mg    02/22/18 0410 Given   metroNIDAZOLE (FLAGYL) tablet 500 mg 500 mg    02/22/18 1409 Given   metroNIDAZOLE (FLAGYL) tablet 500 mg 500 mg      . amiodarone  200 mg Oral Daily  . atorvastatin  10 mg Oral QPM  . calcium acetate  1,334 mg Oral TID WC  . ceftAZIDime  2 g Intravenous Q M,W,F-1800  . febuxostat  40 mg Oral QPM  . guaiFENesin  600 mg Oral BID  . heparin  5,000 Units Subcutaneous Q8H  . levothyroxine  50 mcg Oral QAC breakfast  . metroNIDAZOLE  500 mg Oral Q8H  . midodrine  10 mg Oral TID AC  . montelukast  10 mg Oral QPM  . multivitamin  1 tablet Oral Daily  . nystatin   Topical BID  . pantoprazole  40 mg Oral QAC breakfast  . phytonadione  5 mg Oral Once  . triamcinolone ointment   Topical BID  . umeclidinium-vilanterol  1 puff Inhalation Daily    Objective: Vital signs in last 24 hours: Temp:  [97.5 F (36.4 C)-98.4 F (36.9 C)] 97.9 F (36.6  C) (02/27 1407) Pulse Rate:  [87-106] 102 (02/27 1407) Resp:  [6-28] 18 (02/27 1407) BP: (72-100)/(26-79) 90/55 (02/27 1407) SpO2:  [93 %-99 %] 93 % (02/27 1530) Weight:  [115.1 kg (253 lb 12 oz)-118.9 kg (262 lb 2 oz)] 115.1 kg (253 lb 12 oz) (02/27 1342) Constitutional: He is oriented to person, place, and time. obese HENT: anicteric  Mouth/Throat: Oropharynx is clear and moist. No oropharyngeal exudate.  Cardiovascular: Normal rate, regular rhythm and normal heart sounds. Exam reveals no gallop and no friction rub.  No murmur heard.  Pulmonary/Chest: Effort normal and breath sounds normal. No respiratory distress. He has no wheezes.  Abdominal: Soft. Bowel sounds are normal. He exhibits no distension. There is no tenderness.  Lymphadenopathy:  He has no cervical adenopathy.  Neurological: He is alert and oriented to person, place, and time.  Skin: diffuse vascultiis rash on bil LE Psychiatric: He has a normal mood and affect. His behavior is normal.   Lab Results Recent Labs    02/20/18 0449 02/21/18 0413 02/22/18 0641  WBC 12.6*  --  11.0*  HGB 11.2*  --  10.6*  HCT 35.4*  --  33.2*  NA  133* 133* 135  K 5.4* 5.9* 5.7*  CL 95* 97* 97*  CO2 23 19* 21*  BUN 42* 49* 42*  CREATININE 7.84* 8.89* 7.65*    Microbiology: Results for orders placed or performed during the hospital encounter of 02/20/18  Urine culture     Status: Abnormal   Collection Time: 02/20/18  3:29 AM  Result Value Ref Range Status   Specimen Description   Final    URINE, RANDOM Performed at [facility], [address], [place], [place] [address]    Special Requests   Final    NONE Performed at [facility], [address] [place], [place] [address]    Culture MULTIPLE SPECIES PRESENT, SUGGEST RECOLLECTION (A)  Final   Report Status 02/22/2018 FINAL  Final  C difficile quick scan w PCR reflex     Status: Abnormal   Collection Time: 02/20/18  3:29 AM  Result Value Ref Range  Status   C Diff antigen POSITIVE (A) NEGATIVE Final   C Diff toxin NEGATIVE NEGATIVE Final   C Diff interpretation Results are indeterminate. See PCR results.  Final    Comment: Performed at [facility], [address] [place], [place] [address]  C. Diff by PCR, Reflexed     Status: None   Collection Time: 02/20/18  3:29 AM  Result Value Ref Range Status   Toxigenic C. Difficile by PCR NEGATIVE NEGATIVE Final    Comment: Patient is colonized with non toxigenic C. difficile. May not need treatment unless significant symptoms are present. Performed at [facility], [address] [place], [place] [address]   Blood culture (routine x 2)     Status: None (Preliminary result)   Collection Time: 02/20/18  8:16 AM  Result Value Ref Range Status   Specimen Description BLOOD RIGHT ANTECUBITAL  Final   Special Requests   Final    BOTTLES DRAWN AEROBIC AND ANAEROBIC Blood Culture results may not be optimal due to an excessive volume of blood received in culture bottles   Culture   Final    NO GROWTH 2 DAYS Performed at [facility], [address], [place], [place] [address]    Report Status PENDING  Incomplete  Blood culture (routine x 2)     Status: None (Preliminary result)   Collection Time: 02/20/18  9:20 AM  Result Value Ref Range Status   Specimen Description BLOOD BLOOD RIGHT ARM  Final   Special Requests   Final    BOTTLES DRAWN AEROBIC AND ANAEROBIC Blood Culture adequate volume   Culture   Final    NO GROWTH 2 DAYS Performed at [facility], [address] [place], [place] [address]    Report Status PENDING  Incomplete  MRSA PCR Screening     Status: None   Collection Time: 02/20/18  6:02 PM  Result Value Ref Range Status   MRSA by PCR NEGATIVE NEGATIVE Final    Comment:        The GeneXpert MRSA Assay (FDA approved for NASAL specimens only), is one component of a comprehensive MRSA colonization surveillance program. It is  not intended to diagnose MRSA infection nor to guide or monitor treatment for MRSA infections. Performed at [facility], [address], [place], [place] [address]     Studies/Results: No results found.  Assessment/Plan: TIMONTHY HOVATER is a 77 y.o. male admitted from HD with hypotension. He has a complicated hx of intraabdominal abscess, with colovesicular fistula  evaluated at [facility].  He was currently being treated with IV vanco and ceftazidime at HD and oral flagyl after 10 day stay at [facility] 2/5-2/14.  Stop date was top be 3/19 when he would be followed up at [facility].  He has a complicated hx including IGA vasculitis, on HD for 4 years, A flutter, CAD, COPD.   CT shows increase in size of fluid collection compared to 01/09/18.  However clinically he denies pain or other changes.  Recommendations Continue abx course planned from time of dc from [facility]-  IV vanco and ceftazidime at HD and oral flagyl until 3/19.  Will continue to be followed at [facility] after DC. Agree with surgery regarding possible aspiration and drain placement but long term surgical management deferred to [facility].  Thank you very much for the consult. Will follow with you.  Leonel Ramsay   02/22/2018, 4:26 PM

## 2018-02-22 NOTE — Progress Notes (Signed)
Pre HD assessment  

## 2018-02-22 NOTE — Consult Note (Signed)
Pharmacy Antibiotic Note  Maurice Peterson is a 77 y.o. male admitted on 02/20/2018 with intraabdominal abscess, with colovesicular fistula .  Pharmacy has been consulted for vancomycin dosing. Pt was admitted to [facility] and d/c on vancomycin, ceftazidime, and po flagyl until 3/19 when he is to follow up with [facility] ID. Pt was admitted for hypotension. Pt on transfer list to [facility], but currently no bed available.  Plan: Pt was on vancomycin 1500mg  and ceftaz 2g after HD and metronidazole 500mg  TID Continue ceftaz and metronidazole as above. Pt will need an additional ceftaz dose today after HD. Will order a 1 time 1g dose for tonight and resume 2g AFTER HD tomorrow Pt was given 2g of vancomycin in the ED, did not receive HD yeterday. Plans to get HD today and then back to normal MWF schedule tomorrow. I will draw a random vanc level today AFTER HD to determine if pt needs a supplemental dose after HD today since pt received extra dose yesterday. Follow up level tonight  02/27 @ 0038 VR 42, goal post HD session < 20 - 25 mcg/mL. Will hold off on vanc and draw another post HD level @ 2200. Dialysis should take off 40 - 50% of vanc, so tomorrow evening patient should be down to 21 mcg/mL. If level comes back < 20 - 25 mcg/mL will start vanc 1g qMWF after HD.  Height: 5\' 11"  (180.3 cm) Weight: 257 lb 8 oz (116.8 kg) IBW/kg (Calculated) : 75.3  Temp (24hrs), Avg:98.2 F (36.8 C), Min:97.6 F (36.4 C), Max:98.6 F (37 C)  Recent Labs  Lab 02/20/18 0449 02/20/18 0816 02/21/18 0413 02/22/18 0033  WBC 12.6*  --   --   --   CREATININE 7.84*  --  8.89*  --   LATICACIDVEN  --  1.6  --   --   VANCORANDOM  --   --   --  42    Estimated Creatinine Clearance: 9.2 mL/min (A) (by C-G formula based on SCr of 8.89 mg/dL (H)).    Allergies  Allergen Reactions  . Nsaids Nausea And Vomiting    Anti-inflammatories.  . Amoxicillin-Pot Clavulanate Nausea And Vomiting    Has patient had a PCN reaction causing  immediate rash, facial/tongue/throat swelling, SOB or lightheadedness with hypotension: No Has patient had a PCN reaction causing severe rash involving mucus membranes or skin necrosis: No Has patient had a PCN reaction that required hospitalization:Yes-vomiting blood Has patient had a PCN reaction occurring within the last 10 years: Yes If all of the above answers are "NO", then may proceed with Cephalosporin use.     Antimicrobials this admission: vancomycin  >>  ceftaz >>  Metronidazole >> cipro 2/25>>2/26  Dose adjustments this admission:   Microbiology results: 2/25 BCx: NG <24hr 2/25 MRSA PCR: neg 2/25 cdiff antigen positive, toxin neg- PCR pending  Thank you for allowing pharmacy to be a part of this patient's care.  Tobie Lords, PharmD, BCPS Clinical Pharmacist 02/22/2018

## 2018-02-22 NOTE — Telephone Encounter (Signed)
Patient wife Vaughan Basta) calling,  States that she is returning call from yesterday in regards to results. Patient wife state that patient is currently is in the hospital at [facility] and can be reached at [phone].

## 2018-02-22 NOTE — Progress Notes (Signed)
HD tx end  

## 2018-02-22 NOTE — Progress Notes (Signed)
[facility]  ROUNDING NOTE   Subjective:   Seen and examined on hemodialysis. Tolerated treatment well.     HEMODIALYSIS FLOWSHEET:  Blood Flow Rate (mL/min): 400 mL/min Arterial Pressure (mmHg): -180 mmHg Venous Pressure (mmHg): 150 mmHg Transmembrane Pressure (mmHg): 50 mmHg Ultrafiltration Rate (mL/min): 500 mL/min Dialysate Flow Rate (mL/min): 600 ml/min Conductivity: Machine : 14 Conductivity: Machine : 14 Dialysis Fluid Bolus: Normal Saline Bolus Amount (mL): 250 mL    Objective:  Vital signs in last 24 hours:  Temp:  [97.5 F (36.4 C)-98.4 F (36.9 C)] 97.9 F (36.6 C) (02/27 1407) Pulse Rate:  [87-106] 102 (02/27 1407) Resp:  [6-28] 18 (02/27 1407) BP: (72-100)/(26-79) 90/55 (02/27 1407) SpO2:  [93 %-99 %] 93 % (02/27 1530) Weight:  [115.1 kg (253 lb 12 oz)-118.9 kg (262 lb 2 oz)] 115.1 kg (253 lb 12 oz) (02/27 1342)  Weight change:  Filed Weights   02/21/18 2215 02/22/18 1005 02/22/18 1342  Weight: 116.8 kg (257 lb 8 oz) 115.8 kg (255 lb 4.7 oz) 115.1 kg (253 lb 12 oz)    Intake/Output: I/O last 3 completed shifts: In: 30 [P.O.:30] Out: 0    Intake/Output this shift:  Total I/O In: 100.3 [IV Piggyback:100.3] Out: 1044 [Other:1044]  Physical Exam: General: NAD,   Head: Normocephalic, atraumatic. Moist oral mucosal membranes  Eyes: Anicteric, PERRL  Neck: Supple, trachea midline  Lungs:  Clear to auscultation  Heart: Regular rate and rhythm  Abdomen:  Soft, nontender,   Extremities: + peripheral edema.  Neurologic: Nonfocal, moving all four extremities  Skin: +erythema petechial lesions       Basic Metabolic Panel: Recent Labs  Lab 02/20/18 0449 02/21/18 0413 02/22/18 0641  NA 133* 133* 135  K 5.4* 5.9* 5.7*  CL 95* 97* 97*  CO2 23 19* 21*  GLUCOSE 125* 79 81  BUN 42* 49* 42*  CREATININE 7.84* 8.89* 7.65*  CALCIUM 8.4* 8.6* 8.4*  MG 2.3  --   --     Liver Function Tests: Recent Labs  Lab 02/20/18 0449  AST 32   ALT 22  ALKPHOS 66  BILITOT 1.0  PROT 7.2  ALBUMIN 2.8*   No results for input(s): LIPASE, AMYLASE in the last 168 hours. No results for input(s): AMMONIA in the last 168 hours.  CBC: Recent Labs  Lab 02/20/18 0449 02/22/18 0641  WBC 12.6* 11.0*  NEUTROABS 10.3*  --   HGB 11.2* 10.6*  HCT 35.4* 33.2*  MCV 92.5 91.5  PLT 232 219    Cardiac Enzymes: Recent Labs  Lab 02/20/18 0449 02/20/18 1146  TROPONINI 0.03* 0.03*    BNP: Invalid input(s): POCBNP  CBG: No results for input(s): GLUCAP in the last 168 hours.  Microbiology: Results for orders placed or performed during the hospital encounter of 02/20/18  Urine culture     Status: Abnormal   Collection Time: 02/20/18  3:29 AM  Result Value Ref Range Status   Specimen Description   Final    URINE, RANDOM Performed at [facility], [address] [place], [place] [address]    Special Requests   Final    NONE Performed at [facility], [address] [place], [place] [address]    Culture MULTIPLE SPECIES PRESENT, SUGGEST RECOLLECTION (A)  Final   Report Status 02/22/2018 FINAL  Final  C difficile quick scan w PCR reflex     Status: Abnormal   Collection Time: 02/20/18  3:29 AM  Result Value Ref Range Status  C Diff antigen POSITIVE (A) NEGATIVE Final   C Diff toxin NEGATIVE NEGATIVE Final   C Diff interpretation Results are indeterminate. See PCR results.  Final    Comment: Performed at [facility], [address] [place], [place] [address]  C. Diff by PCR, Reflexed     Status: None   Collection Time: 02/20/18  3:29 AM  Result Value Ref Range Status   Toxigenic C. Difficile by PCR NEGATIVE NEGATIVE Final    Comment: Patient is colonized with non toxigenic C. difficile. May not need treatment unless significant symptoms are present. Performed at [facility], [address] [place], [place] [address]   Blood culture (routine x 2)     Status: None (Preliminary  result)   Collection Time: 02/20/18  8:16 AM  Result Value Ref Range Status   Specimen Description BLOOD RIGHT ANTECUBITAL  Final   Special Requests   Final    BOTTLES DRAWN AEROBIC AND ANAEROBIC Blood Culture results may not be optimal due to an excessive volume of blood received in culture bottles   Culture   Final    NO GROWTH 2 DAYS Performed at [facility], [address] [place], [place] [address]    Report Status PENDING  Incomplete  Blood culture (routine x 2)     Status: None (Preliminary result)   Collection Time: 02/20/18  9:20 AM  Result Value Ref Range Status   Specimen Description BLOOD BLOOD RIGHT ARM  Final   Special Requests   Final    BOTTLES DRAWN AEROBIC AND ANAEROBIC Blood Culture adequate volume   Culture   Final    NO GROWTH 2 DAYS Performed at [facility], [address], [place], [place] [address]    Report Status PENDING  Incomplete  MRSA PCR Screening     Status: None   Collection Time: 02/20/18  6:02 PM  Result Value Ref Range Status   MRSA by PCR NEGATIVE NEGATIVE Final    Comment:        The GeneXpert MRSA Assay (FDA approved for NASAL specimens only), is one component of a comprehensive MRSA colonization surveillance program. It is not intended to diagnose MRSA infection nor to guide or monitor treatment for MRSA infections. Performed at [facility], [address] [place],  [address]     Coagulation Studies: Recent Labs    02/20/18 1834 02/21/18 0940 02/22/18 0641  LABPROT 22.5* 23.4* 23.5*  INR 2.00 2.10 2.11    Urinalysis: No results for input(s): COLORURINE, LABSPEC, PHURINE, GLUCOSEU, HGBUR, BILIRUBINUR, KETONESUR, PROTEINUR, UROBILINOGEN, NITRITE, LEUKOCYTESUR in the last 72 hours.  Invalid input(s): APPERANCEUR    Imaging: No results found.   Medications:   . cefTAZidime (FORTAZ)  IV     . amiodarone  200 mg Oral Daily  . atorvastatin  10 mg Oral QPM  . calcium acetate   1,334 mg Oral TID WC  . ceftAZIDime  2 g Intravenous Q M,W,F-1800  . febuxostat  40 mg Oral QPM  . guaiFENesin  600 mg Oral BID  . heparin  5,000 Units Subcutaneous Q8H  . levothyroxine  50 mcg Oral QAC breakfast  . metroNIDAZOLE  500 mg Oral Q8H  . midodrine  10 mg Oral TID AC  . montelukast  10 mg Oral QPM  . multivitamin  1 tablet Oral Daily  . nystatin   Topical BID  . pantoprazole  40 mg Oral QAC breakfast  . triamcinolone ointment   Topical  BID  . umeclidinium-vilanterol  1 puff Inhalation Daily   acetaminophen, albuterol, cefTAZidime (FORTAZ)  IV, [DISCONTINUED] guaiFENesin **AND** dextromethorphan, diphenhydrAMINE, HYDROcodone-acetaminophen, ketotifen, ondansetron **OR** ondansetron (ZOFRAN) IV, pentafluoroprop-tetrafluoroeth  Assessment/ Plan:  [name] is a 77 y.o. white male with ESRD on HD MWF followed by [facility] nephrology, igA Nephropathy by biopsy 1996, COPD, GERD, hypertension, hyperlipidemia, prostate cancer, renal cell cancer (cryoablation),COPD, chronic systolic heart failure ejection fraction 35%, Atrial fibrillation    [facility]/MWF/[address]  1.  End-stage renal disease: MWF.  Seen and examined on hemodialysis.   2.  Anemia of chronic kidney disease:   - Mircera as outpatient.   3.  Secondary hyperparathyroidism:  - calcium acetate with meals.   4. Diarrhea: with intraabdominal abscess on IV vancomycin, metronidazole, ceftazidime - Appreciate ID input.    LOS: 2 Shey Yott 2/27/20196:40 PM

## 2018-02-22 NOTE — Progress Notes (Signed)
Post HD assessment  

## 2018-02-22 NOTE — Progress Notes (Signed)
Patient alert and oriented. Patient states that he doesn't want anyone to call his wife due to recent fall. Patient states that wife will return between 45 and 7am and He will inform her at that time

## 2018-02-22 NOTE — Telephone Encounter (Signed)
Left message for patient regarding phone call back and TSH levels. Asked patient to follow up with his PCP when he gets out of the hospital and is feeling better.

## 2018-02-22 NOTE — Progress Notes (Signed)
Patient's bed alarm went off NT and nursing staff  ran into patient's room. Patient found on the floor face down with yellow socks on and yellow arm band. Camera operator notified, nursing supervisor and Brule notified. Wife will be contacted. MD paged. Patient alert and oriented but states that he hit his head. Skin tear on right arm, right elbow and left hand. Patient stable will continue to monitor.

## 2018-02-22 NOTE — Progress Notes (Signed)
HD tx start 

## 2018-02-23 ENCOUNTER — Inpatient Hospital Stay: Payer: Medicare Other

## 2018-02-23 DIAGNOSIS — K651 Peritoneal abscess: Secondary | ICD-10-CM

## 2018-02-23 LAB — PROTIME-INR
INR: 1.46
Prothrombin Time: 17.6 seconds — ABNORMAL HIGH (ref 11.4–15.2)

## 2018-02-23 LAB — VANCOMYCIN, RANDOM: VANCOMYCIN RM: 33

## 2018-02-23 MED ORDER — MIDAZOLAM HCL 5 MG/5ML IJ SOLN
INTRAMUSCULAR | Status: AC | PRN
Start: 2018-02-23 — End: 2018-02-23
  Administered 2018-02-23: 1 mg via INTRAVENOUS

## 2018-02-23 MED ORDER — FENTANYL CITRATE (PF) 100 MCG/2ML IJ SOLN
INTRAMUSCULAR | Status: AC
  Filled 2018-02-23: qty 4

## 2018-02-23 MED ORDER — MIDAZOLAM HCL 5 MG/5ML IJ SOLN
INTRAMUSCULAR | Status: AC
  Filled 2018-02-23: qty 5

## 2018-02-23 MED ORDER — LIDOCAINE HCL (PF) 1 % IJ SOLN
INTRAMUSCULAR | Status: AC | PRN
  Administered 2018-02-23: 10 mL

## 2018-02-23 MED ORDER — FENTANYL CITRATE (PF) 100 MCG/2ML IJ SOLN
INTRAMUSCULAR | Status: AC | PRN
  Administered 2018-02-23 (×2): 25 ug via INTRAVENOUS

## 2018-02-23 MED ORDER — SODIUM CHLORIDE 0.9 % IV SOLN
INTRAVENOUS | Status: DC
  Administered 2018-02-23: 15:00:00 via INTRAVENOUS

## 2018-02-23 NOTE — Plan of Care (Signed)
Pt went for drain placement today. No pain after returning to unit

## 2018-02-23 NOTE — Progress Notes (Signed)
[facility] NAME: Maurice Peterson    MR#:  [id number]  DATE OF BIRTH:  19-Feb-1941  SUBJECTIVE:  CHIEF COMPLAINT:   Chief Complaint  Patient presents with  . Hypotension   - INR improved to 1.4- has been NPO - BP low but close to baseline - no complaints, breathing is better  REVIEW OF SYSTEMS:  Review of Systems  Constitutional: Positive for malaise/fatigue. Negative for chills and fever.  HENT: Negative for congestion, ear discharge, hearing loss and nosebleeds.   Eyes: Negative for blurred vision and double vision.  Respiratory: Positive for cough and shortness of breath. Negative for wheezing.   Cardiovascular: Positive for leg swelling. Negative for chest pain and palpitations.  Gastrointestinal: Negative for abdominal pain, constipation, diarrhea, nausea and vomiting.  Genitourinary: Negative for dysuria.  Musculoskeletal: Negative for myalgias.  Skin: Positive for rash.  Neurological: Negative for dizziness, speech change, focal weakness, seizures and headaches.    DRUG ALLERGIES:   Allergies  Allergen Reactions  . Nsaids Nausea And Vomiting    Anti-inflammatories.  . Amoxicillin-Pot Clavulanate Nausea And Vomiting    Has patient had a PCN reaction causing immediate rash, facial/tongue/throat swelling, SOB or lightheadedness with hypotension: No Has patient had a PCN reaction causing severe rash involving mucus membranes or skin necrosis: No Has patient had a PCN reaction that required hospitalization:Yes-vomiting blood Has patient had a PCN reaction occurring within the last 10 years: Yes If all of the above answers are "NO", then may proceed with Cephalosporin use.     VITALS:  Blood pressure (!) 80/46, pulse (!) 102, temperature 97.9 F (36.6 C), temperature source Oral, resp. rate 20, height 5\' 11"  (1.803 m), weight 115.1 kg (253 lb 12 oz), SpO2 94 %.  PHYSICAL EXAMINATION:  Physical Exam  GENERAL:  77  y.o.-year-old patient lying in the bed with no acute distress.  EYES: Pupils equal, round, reactive to light and accommodation. No scleral icterus. Extraocular muscles intact.  HEENT: Head atraumatic, normocephalic. Oropharynx and nasopharynx clear.  NECK:  Supple, no jugular venous distention. No thyroid enlargement, no tenderness.  LUNGS: Normal breath sounds bilaterally, scattered wheezing noted, no  rales,rhonchi or crepitation. No use of accessory muscles of respiration.  CARDIOVASCULAR: S1, S2 normal. No  rubs, or gallops. 3/6 systolic murmur present ABDOMEN: Soft, nontender, nondistended. Bowel sounds present. No organomegaly or mass.  EXTREMITIES: No  cyanosis, or clubbing. 1+ pedal edema noted. Vasculitis rash on both feet- fading now Left forearm AV fistula noted NEUROLOGIC: Cranial nerves II through XII are intact. Muscle strength 5/5 in all extremities. Sensation intact. Gait not checked.  PSYCHIATRIC: The patient is alert and oriented x 3.  SKIN: No obvious rash, lesion, or ulcer.    LABORATORY PANEL:   CBC Recent Labs  Lab 02/22/18 0641  WBC 11.0*  HGB 10.6*  HCT 33.2*  PLT 219   ------------------------------------------------------------------------------------------------------------------  Chemistries  Recent Labs  Lab 02/20/18 0449  02/22/18 0641  NA 133*   < > 135  K 5.4*   < > 5.7*  CL 95*   < > 97*  CO2 23   < > 21*  GLUCOSE 125*   < > 81  BUN 42*   < > 42*  CREATININE 7.84*   < > 7.65*  CALCIUM 8.4*   < > 8.4*  MG 2.3  --   --   AST 32  --   --   ALT 22  --   --  ALKPHOS 66  --   --   BILITOT 1.0  --   --    < > = values in this interval not displayed.   ------------------------------------------------------------------------------------------------------------------  Cardiac Enzymes Recent Labs  Lab 02/20/18 1146  TROPONINI 0.03*    ------------------------------------------------------------------------------------------------------------------  RADIOLOGY:  No results found.  EKG:   Orders placed or performed during the hospital encounter of 02/20/18  . ED EKG  . ED EKG  . EKG 12-Lead  . EKG 12-Lead  . EKG 12-Lead  . 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 Hypotension-could be sepsis. Due to colo- vesical fistula and abscess in the location. Previous drainage attempted by IR was unsuccessful. -CT yesterday showed increase in size of the fluid collection. -Interventional radiology to perform a percutaneous drainage o- likely today- patient has been NPO- -Appreciate ID consult. He was started on vancomycin, ceftaz with dialysis and also Flagyl at [facility] 10 days ago. We'll continue them for now as recommended until 03/04/18 -Appreciate surgical consult for the same -Patient does have known history of hypotension, he is on midodrine which we will continue - gentle fluids today prior to IR procedure  2. Atrial flutter-rate controlled. Continue amiodarone. On Toprol which is held due to hypotension for now -Eliquis held since 02/18/18 for IR procedure  3. 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 Being followed by [facility] dermatology. Has had steroids in the past. In the process of starting immunosuppressants once his infection is treated - topical triamcinolone ordered  4. COPD-stable, nebs prn Cough meds, improved with nebs  5. End-stage renal disease-appreciate nephrology consult. Patient on Monday, Wednesday and Friday dialysis.  - Dialysis tomorrow per schedule  6. DVT  prophylaxis-subcutaneous heparin   Physical therapy consulted    All the records are reviewed and case discussed with Care Management/Social Workerr. Management plans discussed with the patient, family and they are in agreement.  CODE STATUS: Full Code  TOTAL TIME TAKING CARE OF THIS PATIENT: 39 minutes.   POSSIBLE D/C IN 2-3 DAYS, DEPENDING ON CLINICAL CONDITION.   Gladstone Lighter M.D on 02/23/2018 at 2:49 PM  Between 7am to 6pm - Pager - [phone]  After 6pm go to [email or web address] - password [email or web address] [place]  Office  [phone]  CC: Primary care physician; Baxter Hire, MD

## 2018-02-23 NOTE — Progress Notes (Signed)
PT Cancellation Note  Patient Details Name: Maurice Peterson MRN: [id number] DOB: July 24, 1941   Cancelled Treatment:    Reason Eval/Treat Not Completed: Medical issues which prohibited therapy(Consult received and chart reviewed. Patient noted with elevated postassium levels (most recent K+ 5.7); contraindicated for exertional activity.  Per notes, scheduled for IR procedure this date (drainage of abscess) and pending possible transfer to [facility].  Will hold this date and continue efforts next date as medically appropriate and available.)   Yashira Offenberger H. Owens Shark, PT, DPT, NCS 02/23/18, 3:59 PM [phone]

## 2018-02-23 NOTE — Consult Note (Addendum)
Pharmacy Antibiotic Note  Maurice Peterson is a 77 y.o. male admitted on 02/20/2018 with intraabdominal abscess, with colovesicular fistula .  Pharmacy has been consulted for vancomycin dosing. Pt was admitted to [facility] and d/c on vancomycin, ceftazidime, and po flagyl until 3/19 when he is to follow up with [facility] ID. Pt was admitted for hypotension. Pt on transfer list to [facility], but currently no bed available.  Plan: Pt was on vancomycin 1500mg  and ceftaz 2g after HD and metronidazole 500mg  TID Continue ceftaz and metronidazole as above. Pt will need an additional ceftaz dose today after HD. Will order a 1 time 1g dose for tonight and resume 2g AFTER HD tomorrow Pt was given 2g of vancomycin in the ED, did not receive HD yeterday. Plans to get HD today and then back to normal MWF schedule tomorrow. I will draw a random vanc level today AFTER HD to determine if pt needs a supplemental dose after HD today since pt received extra dose yesterday. Follow up level tonight  02/27 @ 0038 VR 42, goal post HD session < 20 - 25 mcg/mL. Will hold off on vanc and draw another post HD level @ 2200. Dialysis should take off 40 - 50% of vanc, so tomorrow evening patient should be down to 21 mcg/mL. If level comes back < 20 - 25 mcg/mL will start vanc 1g qMWF after HD.  02/27 @ 2300 VR 33, goal post HD < 20 - 25 mcg/mL. Dialysis took off 21% of dose. Will continue to hold off on vanc until level is at least < 25 mcg/mL to redose. Will draw another level post HD 03/01.   Height: 5\' 11"  (180.3 cm) Weight: 253 lb 12 oz (115.1 kg) IBW/kg (Calculated) : 75.3  Temp (24hrs), Avg:98 F (36.7 C), Min:97.5 F (36.4 C), Max:98.4 F (36.9 C)  Recent Labs  Lab 02/20/18 0449 02/20/18 0816 02/21/18 0413 02/22/18 0033 02/22/18 0641 02/22/18 2300  WBC 12.6*  --   --   --  11.0*  --   CREATININE 7.84*  --  8.89*  --  7.65*  --   LATICACIDVEN  --  1.6  --   --   --   --   VANCORANDOM  --   --   --  42  --  33    Estimated  Creatinine Clearance: 10.6 mL/min (A) (by C-G formula based on SCr of 7.65 mg/dL (H)).    Allergies  Allergen Reactions  . Nsaids Nausea And Vomiting    Anti-inflammatories.  . Amoxicillin-Pot Clavulanate Nausea And Vomiting    Has patient had a PCN reaction causing immediate rash, facial/tongue/throat swelling, SOB or lightheadedness with hypotension: No Has patient had a PCN reaction causing severe rash involving mucus membranes or skin necrosis: No Has patient had a PCN reaction that required hospitalization:Yes-vomiting blood Has patient had a PCN reaction occurring within the last 10 years: Yes If all of the above answers are "NO", then may proceed with Cephalosporin use.     Antimicrobials this admission: vancomycin  >>  ceftaz >>  Metronidazole >> cipro 2/25>>2/26  Dose adjustments this admission:   Microbiology results: 2/25 BCx: NG <24hr 2/25 MRSA PCR: neg 2/25 cdiff antigen positive, toxin neg- PCR pending  Thank you for allowing pharmacy to be a part of this patient's care.  Tobie Lords, PharmD, BCPS Clinical Pharmacist 02/23/2018

## 2018-02-23 NOTE — Progress Notes (Signed)
[facility]  ROUNDING NOTE   Subjective:   Family at bedside.    Objective:  Vital signs in last 24 hours:  Temp:  [97.6 F (36.4 C)-98.2 F (36.8 C)] 97.9 F (36.6 C) (02/28 0516) Pulse Rate:  [87-102] 102 (02/28 0516) Resp:  [17-28] 20 (02/28 0516) BP: (72-97)/(26-79) 80/46 (02/28 0516) SpO2:  [93 %-99 %] 94 % (02/28 0516) Weight:  [115.1 kg (253 lb 12 oz)] 115.1 kg (253 lb 12 oz) (02/27 1342)  Weight change: -3.1 kg (-13.4 oz) Filed Weights   02/21/18 2215 02/22/18 1005 02/22/18 1342  Weight: 116.8 kg (257 lb 8 oz) 115.8 kg (255 lb 4.7 oz) 115.1 kg (253 lb 12 oz)    Intake/Output: I/O last 3 completed shifts: In: 490.3 [P.O.:390; IV Piggyback:100.3] Out: 1044 [Other:1044]   Intake/Output this shift:  No intake/output data recorded.  Physical Exam: General: NAD,   Head: Normocephalic, atraumatic. Moist oral mucosal membranes  Eyes: Anicteric, PERRL  Neck: Supple, trachea midline  Lungs:  Clear to auscultation  Heart: Regular rate and rhythm  Abdomen:  Soft, nontender,   Extremities: + peripheral edema.  Neurologic: Nonfocal, moving all four extremities  Skin: +erythema petechial lesions       Basic Metabolic Panel: Recent Labs  Lab 02/20/18 0449 02/21/18 0413 02/22/18 0641  NA 133* 133* 135  K 5.4* 5.9* 5.7*  CL 95* 97* 97*  CO2 23 19* 21*  GLUCOSE 125* 79 81  BUN 42* 49* 42*  CREATININE 7.84* 8.89* 7.65*  CALCIUM 8.4* 8.6* 8.4*  MG 2.3  --   --     Liver Function Tests: Recent Labs  Lab 02/20/18 0449  AST 32  ALT 22  ALKPHOS 66  BILITOT 1.0  PROT 7.2  ALBUMIN 2.8*   No results for input(s): LIPASE, AMYLASE in the last 168 hours. No results for input(s): AMMONIA in the last 168 hours.  CBC: Recent Labs  Lab 02/20/18 0449 02/22/18 0641  WBC 12.6* 11.0*  NEUTROABS 10.3*  --   HGB 11.2* 10.6*  HCT 35.4* 33.2*  MCV 92.5 91.5  PLT 232 219    Cardiac Enzymes: Recent Labs  Lab 02/20/18 0449 02/20/18 1146   TROPONINI 0.03* 0.03*    BNP: Invalid input(s): POCBNP  CBG: No results for input(s): GLUCAP in the last 168 hours.  Microbiology: Results for orders placed or performed during the hospital encounter of 02/20/18  Urine culture     Status: Abnormal   Collection Time: 02/20/18  3:29 AM  Result Value Ref Range Status   Specimen Description   Final    URINE, RANDOM Performed at [facility], [address], [place], [place] [address]    Special Requests   Final    NONE Performed at [facility], [address] [place], [place] [address]    Culture MULTIPLE SPECIES PRESENT, SUGGEST RECOLLECTION (A)  Final   Report Status 02/22/2018 FINAL  Final  C difficile quick scan w PCR reflex     Status: Abnormal   Collection Time: 02/20/18  3:29 AM  Result Value Ref Range Status   C Diff antigen POSITIVE (A) NEGATIVE Final   C Diff toxin NEGATIVE NEGATIVE Final   C Diff interpretation Results are indeterminate. See PCR results.  Final    Comment: Performed at [facility], [address] [place], [place] [address]  C. Diff by PCR, Reflexed     Status: None   Collection Time: 02/20/18  3:29 AM  Result Value Ref Range Status   Toxigenic C. Difficile by PCR NEGATIVE NEGATIVE Final    Comment: Patient is colonized with non toxigenic C. difficile. May not need treatment unless significant symptoms are present. Performed at [facility], [address] [place], [place] [address]   Blood culture (routine x 2)     Status: None (Preliminary result)   Collection Time: 02/20/18  8:16 AM  Result Value Ref Range Status   Specimen Description BLOOD RIGHT ANTECUBITAL  Final   Special Requests   Final    BOTTLES DRAWN AEROBIC AND ANAEROBIC Blood Culture results may not be optimal due to an excessive volume of blood received in culture bottles   Culture   Final    NO GROWTH 3 DAYS Performed at [facility], [address] [place], [place]  [address]    Report Status PENDING  Incomplete  Blood culture (routine x 2)     Status: None (Preliminary result)   Collection Time: 02/20/18  9:20 AM  Result Value Ref Range Status   Specimen Description BLOOD BLOOD RIGHT ARM  Final   Special Requests   Final    BOTTLES DRAWN AEROBIC AND ANAEROBIC Blood Culture adequate volume   Culture   Final    NO GROWTH 3 DAYS Performed at [facility], [address] [place], [place] [address]    Report Status PENDING  Incomplete  MRSA PCR Screening     Status: None   Collection Time: 02/20/18  6:02 PM  Result Value Ref Range Status   MRSA by PCR NEGATIVE NEGATIVE Final    Comment:        The GeneXpert MRSA Assay (FDA approved for NASAL specimens only), is one component of a comprehensive MRSA colonization surveillance program. It is not intended to diagnose MRSA infection nor to guide or monitor treatment for MRSA infections. Performed at [facility], [address] [place], [place] [address]     Coagulation Studies: Recent Labs    02/20/18 1834 02/21/18 0940 02/22/18 0641 02/23/18 0430  LABPROT 22.5* 23.4* 23.5* 17.6*  INR 2.00 2.10 2.11 1.46    Urinalysis: No results for input(s): COLORURINE, LABSPEC, PHURINE, GLUCOSEU, HGBUR, BILIRUBINUR, KETONESUR, PROTEINUR, UROBILINOGEN, NITRITE, LEUKOCYTESUR in the last 72 hours.  Invalid input(s): APPERANCEUR    Imaging: No results found.   Medications:    . amiodarone  200 mg Oral Daily  . atorvastatin  10 mg Oral QPM  . calcium acetate  1,334 mg Oral TID WC  . ceftAZIDime  2 g Intravenous Q M,W,F-1800  . febuxostat  40 mg Oral QPM  . guaiFENesin  600 mg Oral BID  . heparin  5,000 Units Subcutaneous Q8H  . levothyroxine  50 mcg Oral QAC breakfast  . metroNIDAZOLE  500 mg Oral Q8H  . midodrine  10 mg Oral TID AC  . montelukast  10 mg Oral QPM  . multivitamin  1 tablet Oral Daily  . nystatin   Topical BID  . pantoprazole  40 mg Oral QAC breakfast   . triamcinolone ointment   Topical BID  . umeclidinium-vilanterol  1 puff Inhalation Daily   acetaminophen, albuterol, [DISCONTINUED] guaiFENesin **AND** dextromethorphan, diphenhydrAMINE, HYDROcodone-acetaminophen, ketotifen, ondansetron **OR** ondansetron (ZOFRAN) IV, pentafluoroprop-tetrafluoroeth  Assessment/ Plan:  [name] is a 77 y.o. white male with ESRD on HD MWF followed by [facility] nephrology, igA Nephropathy by biopsy 1996, COPD, GERD, hypertension, hyperlipidemia, prostate cancer, renal cell cancer (cryoablation),COPD, chronic systolic heart failure ejection  fraction 35%, Atrial fibrillation   [facility]/MWF/[address]  1.  End-stage renal disease: MWF.  Dialysis for tomorrow.    2.  Anemia of chronic kidney disease:   - Mircera as outpatient.   3.  Secondary hyperparathyroidism:  - calcium acetate with meals.   4. Diarrhea: with intraabdominal abscess on IV vancomycin, metronidazole, ceftazidime - Appreciate ID input.  - IR for abscess I&D later today.    LOS: 3 Bengie Kaucher 2/28/201910:17 AM

## 2018-02-23 NOTE — Progress Notes (Signed)
02/23/2018  Subjective: Patient has been on dialysis and tolerating.  Continued on antibiotics per ID team at [facility].  Denies any worsening pain.  Did have a bowel movement last night and reported having hematuria too.  Has been getting Vitamin K x 2 days for elevated INR prior to IR procedure.  Vital signs: Temp:  [97.6 F (36.4 C)-98.2 F (36.8 C)] 97.9 F (36.6 C) (02/28 0516) Pulse Rate:  [87-102] 102 (02/28 0516) Resp:  [17-28] 20 (02/28 0516) BP: (72-97)/(26-79) 80/46 (02/28 0516) SpO2:  [93 %-99 %] 94 % (02/28 0516) Weight:  [115.1 kg (253 lb 12 oz)-115.8 kg (255 lb 4.7 oz)] 115.1 kg (253 lb 12 oz) (02/27 1342)   Intake/Output: 02/27 0701 - 02/28 0700 In: 460.3 [P.O.:360; IV Piggyback:100.3] Out: 1044  Last BM Date: 02/22/18  Physical Exam: Constitutional: No acute distress Abdomen:  Soft, non-distended, with mild soreness to palpation in the low abdomen, consistent with location of his abscess.  Labs:  Recent Labs    02/22/18 0641  WBC 11.0*  HGB 10.6*  HCT 33.2*  PLT 219   Recent Labs    02/21/18 0413 02/22/18 0641  NA 133* 135  K 5.9* 5.7*  CL 97* 97*  CO2 19* 21*  GLUCOSE 79 81  BUN 49* 42*  CREATININE 8.89* 7.65*  CALCIUM 8.6* 8.4*   Recent Labs    02/22/18 0641 02/23/18 0430  LABPROT 23.5* 17.6*  INR 2.11 1.46    Imaging: No results found.  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  --INR down to 1.46 today.  Will make NPO now for IR procedure.  May resume diet after procedure completed --transfer to [place], [place]

## 2018-02-23 NOTE — Procedures (Signed)
  Procedure: CT drain placement diverticular abscess 71f in; 99ml feculent out EBL:   minimal Complications:  none immediate  See full dictation in [facility].  Dillard Cannon MD Main # [phone] Pager  [phone]

## 2018-02-24 LAB — BASIC METABOLIC PANEL
ANION GAP: 17 — AB (ref 5–15)
BUN: 33 mg/dL — ABNORMAL HIGH (ref 6–20)
CALCIUM: 8.5 mg/dL — AB (ref 8.9–10.3)
CO2: 23 mmol/L (ref 22–32)
Chloride: 95 mmol/L — ABNORMAL LOW (ref 101–111)
Creatinine, Ser: 7.16 mg/dL — ABNORMAL HIGH (ref 0.61–1.24)
GFR calc Af Amer: 8 mL/min — ABNORMAL LOW (ref >60)
GFR, EST NON AFRICAN AMERICAN: 7 mL/min — AB (ref >60)
GLUCOSE: 96 mg/dL (ref 65–99)
Potassium: 5.2 mmol/L — ABNORMAL HIGH (ref 3.5–5.1)
Sodium: 135 mmol/L (ref 135–145)

## 2018-02-24 LAB — CBC
HCT: 36.8 % — ABNORMAL LOW (ref 40.0–52.0)
HEMOGLOBIN: 11.7 g/dL — AB (ref 13.0–18.0)
MCH: 29.4 pg (ref 26.0–34.0)
MCHC: 31.8 g/dL — ABNORMAL LOW (ref 32.0–36.0)
MCV: 92.3 fL (ref 80.0–100.0)
Platelets: 250 10*3/uL (ref 150–440)
RBC: 3.99 MIL/uL — AB (ref 4.40–5.90)
RDW: 18 % — ABNORMAL HIGH (ref 11.5–14.5)
WBC: 12 10*3/uL — ABNORMAL HIGH (ref 3.8–10.6)

## 2018-02-24 MED ORDER — VITAMIN C 500 MG PO TABS
500.0000 mg | ORAL_TABLET | Freq: Two times a day (BID) | ORAL | Status: DC
  Administered 2018-02-25: 500 mg via ORAL
  Filled 2018-02-24 (×2): qty 1

## 2018-02-24 MED ORDER — HYDROCODONE-ACETAMINOPHEN 5-325 MG PO TABS: 1.0000 | ORAL_TABLET | ORAL | Status: DC | PRN

## 2018-02-24 MED ORDER — CEFTAZIDIME SODIUM IN D5W 2-5 GM/50ML-% IV SOLN: 2.0000 g | INTRAVENOUS | Status: DC

## 2018-02-24 MED ORDER — SODIUM CHLORIDE 0.9 % IV SOLN
2.0000 g | INTRAVENOUS | Status: DC
  Filled 2018-02-24: qty 2

## 2018-02-24 MED ORDER — NEPRO/CARBSTEADY PO LIQD
237.0000 mL | Freq: Two times a day (BID) | ORAL | Status: DC
  Administered 2018-02-25: 237 mL via ORAL

## 2018-02-24 MED ORDER — SODIUM CHLORIDE 0.9% FLUSH
5.0000 mL | Freq: Three times a day (TID) | INTRAVENOUS | Status: DC
  Administered 2018-02-24 – 2018-02-25 (×4): 5 mL

## 2018-02-24 MED ORDER — CEFTAZIDIME SODIUM IN D5W 2-5 GM/50ML-% IV SOLN
2.0000 g | INTRAVENOUS | Status: DC
  Administered 2018-02-24: 2 g via INTRAVENOUS
  Filled 2018-02-24: qty 50

## 2018-02-24 MED ORDER — SODIUM CHLORIDE 0.9 % IV BOLUS (SEPSIS)
250.0000 mL | Freq: Once | INTRAVENOUS | Status: AC
  Administered 2018-02-24: 250 mL via INTRAVENOUS

## 2018-02-24 MED ORDER — APIXABAN 5 MG PO TABS: 5.0000 mg | ORAL_TABLET | Freq: Two times a day (BID) | ORAL | Status: DC

## 2018-02-24 NOTE — Progress Notes (Signed)
HD STARTED  

## 2018-02-24 NOTE — Progress Notes (Signed)
[facility] [place] NAME: Maurice Peterson    MR#:  [id number]  DATE OF BIRTH:  1941/04/08  SUBJECTIVE:  CHIEF COMPLAINT:   Chief Complaint  Patient presents with  . Hypotension   - Status post drain placed by IR for his intraperitoneal abscess. -Complains of dyspnea and weakness -For hemodialysis today  REVIEW OF SYSTEMS:  Review of Systems  Constitutional: Positive for malaise/fatigue. Negative for chills and fever.  HENT: Negative for congestion, ear discharge, hearing loss and nosebleeds.   Eyes: Negative for blurred vision and double vision.  Respiratory: Positive for cough and shortness of breath. Negative for wheezing.   Cardiovascular: Positive for leg swelling. Negative for chest pain and palpitations.  Gastrointestinal: Negative for abdominal pain, constipation, diarrhea, nausea and vomiting.  Genitourinary: Negative for dysuria.  Musculoskeletal: Negative for myalgias.  Skin: Positive for rash.  Neurological: Negative for dizziness, speech change, focal weakness, seizures and headaches.    DRUG ALLERGIES:   Allergies  Allergen Reactions  . Nsaids Nausea And Vomiting    Anti-inflammatories.  . Amoxicillin-Pot Clavulanate Nausea And Vomiting    Has patient had a PCN reaction causing immediate rash, facial/tongue/throat swelling, SOB or lightheadedness with hypotension: No Has patient had a PCN reaction causing severe rash involving mucus membranes or skin necrosis: No Has patient had a PCN reaction that required hospitalization:Yes-vomiting blood Has patient had a PCN reaction occurring within the last 10 years: Yes If all of the above answers are "NO", then may proceed with Cephalosporin use.     VITALS:  Blood pressure 99/79, pulse 97, temperature 98.1 F (36.7 C), resp. rate 18, height 5\' 11"  (1.803 m), weight 115.1 kg (253 lb 12 oz), SpO2 96 %.  PHYSICAL EXAMINATION:  Physical Exam  GENERAL:  77 y.o.-year-old  patient lying in the bed with no acute distress.  EYES: Pupils equal, round, reactive to light and accommodation. No scleral icterus. Extraocular muscles intact.  HEENT: Head atraumatic, normocephalic. Oropharynx and nasopharynx clear.  NECK:  Supple, no jugular venous distention. No thyroid enlargement, no tenderness.  LUNGS: Normal breath sounds bilaterally, minimal scattered wheezing noted, no  rales,rhonchi or crepitation. No use of accessory muscles of respiration. Significantly decreased at the bases CARDIOVASCULAR: S1, S2 normal. No  rubs, or gallops. 3/6 systolic murmur present ABDOMEN: Soft, nontender, nondistended. Bowel sounds present. No organomegaly or mass.  EXTREMITIES: No  cyanosis, or clubbing. 1+ pedal edema noted. Vasculitis rash on both feet- fading now Left forearm AV fistula noted NEUROLOGIC: Cranial nerves II through XII are intact. Muscle strength 5/5 in all extremities. Sensation intact. Gait not checked.  PSYCHIATRIC: The patient is alert and oriented x 3.  SKIN: No obvious rash, lesion, or ulcer.    LABORATORY PANEL:   CBC Recent Labs  Lab 02/24/18 0505  WBC 12.0*  HGB 11.7*  HCT 36.8*  PLT 250   ------------------------------------------------------------------------------------------------------------------  Chemistries  Recent Labs  Lab 02/20/18 0449  02/24/18 0505  NA 133*   < > 135  K 5.4*   < > 5.2*  CL 95*   < > 95*  CO2 23   < > 23  GLUCOSE 125*   < > 96  BUN 42*   < > 33*  CREATININE 7.84*   < > 7.16*  CALCIUM 8.4*   < > 8.5*  MG 2.3  --   --   AST 32  --   --   ALT 22  --   --  ALKPHOS 66  --   --   BILITOT 1.0  --   --    < > = values in this interval not displayed.   ------------------------------------------------------------------------------------------------------------------  Cardiac Enzymes Recent Labs  Lab 02/20/18 1146  TROPONINI 0.03*    ------------------------------------------------------------------------------------------------------------------  RADIOLOGY:  Ct Image Guided Drainage By Percutaneous Catheter  Result Date: 02/24/2018 CLINICAL DATA:  Enlarging diverticular abscess EXAM: CT GUIDED DRAINAGE OF PELVIC ABSCESS ANESTHESIA/SEDATION: Intravenous Fentanyl and Versed were administered as conscious sedation during continuous monitoring of the patient's level of consciousness and physiological / cardiorespiratory status by the radiology RN, with a total moderate sedation time of 17 minutes. PROCEDURE: The procedure, risks, benefits, and alternatives were explained to the patient. Questions regarding the procedure were encouraged and answered. The patient understands and consents to the procedure. Select axial scans through the pelvis were obtained. The collection was localized and an appropriate skin entry site was determined and marked. The operative field was prepped with chlorhexidinein a sterile fashion, and a sterile drape was applied covering the operative field. A sterile gown and sterile gloves were used for the procedure. Local anesthesia was provided with 1% Lidocaine. Under CT fluoroscopic guidance, an 18 gauge trocar needle was advanced into the collection. Purulent material could be aspirated. An Amplatz guidewire advanced easily within the collection, its position confirmed on CT. Tract dilated to facilitate placement of a 12 French pigtail catheter, formed centrally within the collection. 15 mL purulent material were aspirated and sent for Gram stain and culture. The catheter was irrigated and placed to gravity drain bag. Catheter secured externally with 0 Prolene suture and StatLock. The patient tolerated the procedure well. COMPLICATIONS: None immediate FINDINGS: The diverticular abscess adjacent to the urinary bladder was localized. Percutaneous drain catheter placed as above. IMPRESSION: 1. Technically successful  pelvic abscess drain catheter placement. Electronically Signed   By: Lucrezia Europe M.D.   On: 02/24/2018 08:06    EKG:   Orders placed or performed during the hospital encounter of 02/20/18  . ED EKG  . ED EKG  . EKG 12-Lead  . EKG 12-Lead  . EKG 12-Lead  . 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 Hypotension-could be sepsis. Due to colo-vesical fistula and abscess in the location. Previous drainage attempted by IR was unsuccessful. -CT yesterday showed increase in size of the fluid collection. - s/p CT guided drain placement in the abscess and feculent material drained, some sanguinous discharged noted today. -Appreciate ID consult. He was started on vancomycin, ceftaz with dialysis and also Flagyl at [facility] 10 days ago. We'll continue them for now as recommended until 03/04/18 -Appreciate surgical consult for the same -Patient does have known history of hypotension, he is on midodrine which we will continue  2. Atrial flutter-rate controlled. Continue amiodarone. On Toprol which is held due to hypotension for now -Eliquis held since 02/18/18 - we'll restartat discharge  3. 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 Being followed by [facility] dermatology. Has had steroids in the past. In the process of starting immunosuppressants once his infection is treated -topical triamcinolone ordered  4. COPD-stable, nebs prn Cough meds, improved with nebs  5. End-stage renal disease-appreciate nephrology consult. Patient on Monday, Wednesday and Friday dialysis.  - Dialysis today per  schedule  6. DVT prophylaxis-subcutaneous heparin   Physical therapy consulted    All the  records are reviewed and case discussed with Care Management/Social Workerr. Management plans discussed with the patient, family and they are in agreement.  CODE STATUS: Full Code  TOTAL TIME TAKING CARE OF THIS PATIENT: 34 minutes.   POSSIBLE D/C IN 1-2 DAYS, DEPENDING ON CLINICAL CONDITION.   Gladstone Lighter M.D on 02/24/2018 at 1:14 PM  Between 7am to 6pm - Pager - [phone]  After 6pm go to [email or web address] - password [email or web address] [place] [facility]  Office  [phone]  CC: Primary care physician; Baxter Hire, MD

## 2018-02-24 NOTE — Progress Notes (Signed)
02/24/2018  Subjective: Patient got percutaneous abscess drain placed yesterday after improvement in INR.  Cultures currently showing few gram positive cocci and moderate gram variable rod.    Vital signs: Temp:  [97.7 F (36.5 C)-98.1 F (36.7 C)] 97.9 F (36.6 C) (03/01 1615) Pulse Rate:  [97-104] 103 (03/01 1615) Resp:  [18-26] 21 (03/01 1551) BP: (77-106)/(48-79) 92/48 (03/01 1615) SpO2:  [93 %-99 %] 99 % (03/01 1615) Weight:  [113.3 kg (249 lb 12.5 oz)-115.3 kg (254 lb 3.1 oz)] 113.3 kg (249 lb 12.5 oz) (03/01 1551)   Intake/Output: 02/28 0701 - 03/01 0700 In: 289.2 [P.O.:120; I.V.:169.2] Out: 30 [Drains:30] Last BM Date: 02/24/18  Physical Exam: Constitutional: No acute distress Abdomen:  Soft, nondistended, with some soreness to palpation over drain site.  Drain in place with low volume bloody fluid.  Labs:  Recent Labs    02/22/18 0641 02/24/18 0505  WBC 11.0* 12.0*  HGB 10.6* 11.7*  HCT 33.2* 36.8*  PLT 219 250   Recent Labs    02/22/18 0641 02/24/18 0505  NA 135 135  K 5.7* 5.2*  CL 97* 95*  CO2 21* 23  GLUCOSE 81 96  BUN 42* 33*  CREATININE 7.65* 7.16*  CALCIUM 8.4* 8.5*   Recent Labs    02/22/18 0641 02/23/18 0430  LABPROT 23.5* 17.6*  INR 2.11 1.46    Imaging: No results found.  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  Recommend continuing with drainage.  Patient has follow up appointment with surgery at [facility] on 3/7.  Would defer to them about surgical management vs conservative management of his fistula. --continue antibiotics as recommended by [facility] ID team. --will sign off for now.  Please feel free to call or consult again if any questions or concerns.   Melvyn Neth, [place] 7a-7p: [place] 7p-7a: [phone]

## 2018-02-24 NOTE — Progress Notes (Signed)
HD tx end  

## 2018-02-24 NOTE — Progress Notes (Signed)
[facility]  ROUNDING NOTE   Subjective:   Abdominal drain placed by IR.   Hemodialysis for later today.    Objective:  Vital signs in last 24 hours:  Temp:  [97.7 F (36.5 C)-98.1 F (36.7 C)] 97.8 F (36.6 C) (03/01 0356) Pulse Rate:  [97-102] 97 (03/01 0826) Resp:  [18-24] 18 (03/01 0356) BP: (77-99)/(56-79) 99/79 (03/01 0826) SpO2:  [92 %-96 %] 96 % (03/01 0356)  Weight change:  Filed Weights   02/21/18 2215 02/22/18 1005 02/22/18 1342  Weight: 116.8 kg (257 lb 8 oz) 115.8 kg (255 lb 4.7 oz) 115.1 kg (253 lb 12 oz)    Intake/Output: I/O last 3 completed shifts: In: 409.2 [P.O.:240; I.V.:169.2] Out: 30 [Drains:30]   Intake/Output this shift:  Total I/O In: 120 [P.O.:120] Out: -   Physical Exam: General: NAD,   Head: Normocephalic, atraumatic. Moist oral mucosal membranes  Eyes: Anicteric, PERRL  Neck: Supple, trachea midline  Lungs:  Clear to auscultation  Heart: Regular rate and rhythm  Abdomen:  Soft, nontender, left LQ abdominal drain  Extremities: no peripheral edema.  Neurologic: Nonfocal, moving all four extremities  Skin: +erythema petechial lesions       Basic Metabolic Panel: Recent Labs  Lab 02/20/18 0449 02/21/18 0413 02/22/18 0641 02/24/18 0505  NA 133* 133* 135 135  K 5.4* 5.9* 5.7* 5.2*  CL 95* 97* 97* 95*  CO2 23 19* 21* 23  GLUCOSE 125* 79 81 96  BUN 42* 49* 42* 33*  CREATININE 7.84* 8.89* 7.65* 7.16*  CALCIUM 8.4* 8.6* 8.4* 8.5*  MG 2.3  --   --   --     Liver Function Tests: Recent Labs  Lab 02/20/18 0449  AST 32  ALT 22  ALKPHOS 66  BILITOT 1.0  PROT 7.2  ALBUMIN 2.8*   No results for input(s): LIPASE, AMYLASE in the last 168 hours. No results for input(s): AMMONIA in the last 168 hours.  CBC: Recent Labs  Lab 02/20/18 0449 02/22/18 0641 02/24/18 0505  WBC 12.6* 11.0* 12.0*  NEUTROABS 10.3*  --   --   HGB 11.2* 10.6* 11.7*  HCT 35.4* 33.2* 36.8*  MCV 92.5 91.5 92.3  PLT 232 219 250     Cardiac Enzymes: Recent Labs  Lab 02/20/18 0449 02/20/18 1146  TROPONINI 0.03* 0.03*    BNP: Invalid input(s): POCBNP  CBG: No results for input(s): GLUCAP in the last 168 hours.  Microbiology: Results for orders placed or performed during the hospital encounter of 02/20/18  Urine culture     Status: Abnormal   Collection Time: 02/20/18  3:29 AM  Result Value Ref Range Status   Specimen Description   Final    URINE, RANDOM Performed at [facility], [address] [place], [place] [address]    Special Requests   Final    NONE Performed at [facility], [address] [place], [place] [address]    Culture MULTIPLE SPECIES PRESENT, SUGGEST RECOLLECTION (A)  Final   Report Status 02/22/2018 FINAL  Final  C difficile quick scan w PCR reflex     Status: Abnormal   Collection Time: 02/20/18  3:29 AM  Result Value Ref Range Status   C Diff antigen POSITIVE (A) NEGATIVE Final   C Diff toxin NEGATIVE NEGATIVE Final   C Diff interpretation Results are indeterminate. See PCR results.  Final    Comment: Performed at [facility], [address] [place], [place] [address]  C. Diff  by PCR, Reflexed     Status: None   Collection Time: 02/20/18  3:29 AM  Result Value Ref Range Status   Toxigenic C. Difficile by PCR NEGATIVE NEGATIVE Final    Comment: Patient is colonized with non toxigenic C. difficile. May not need treatment unless significant symptoms are present. Performed at [facility], [address] [place], [place] [address]   Blood culture (routine x 2)     Status: None (Preliminary result)   Collection Time: 02/20/18  8:16 AM  Result Value Ref Range Status   Specimen Description BLOOD RIGHT ANTECUBITAL  Final   Special Requests   Final    BOTTLES DRAWN AEROBIC AND ANAEROBIC Blood Culture results may not be optimal due to an excessive volume of blood received in culture bottles   Culture   Final    NO GROWTH 4  DAYS Performed at [facility], [address] [place], [place] [address]    Report Status PENDING  Incomplete  Blood culture (routine x 2)     Status: None (Preliminary result)   Collection Time: 02/20/18  9:20 AM  Result Value Ref Range Status   Specimen Description BLOOD BLOOD RIGHT ARM  Final   Special Requests   Final    BOTTLES DRAWN AEROBIC AND ANAEROBIC Blood Culture adequate volume   Culture   Final    NO GROWTH 4 DAYS Performed at [facility], [address], [place], [place] [address]    Report Status PENDING  Incomplete  MRSA PCR Screening     Status: None   Collection Time: 02/20/18  6:02 PM  Result Value Ref Range Status   MRSA by PCR NEGATIVE NEGATIVE Final    Comment:        The GeneXpert MRSA Assay (FDA approved for NASAL specimens only), is one component of a comprehensive MRSA colonization surveillance program. It is not intended to diagnose MRSA infection nor to guide or monitor treatment for MRSA infections. Performed at [facility], [address] [place], [place] [address]   Aerobic/Anaerobic Culture (surgical/deep wound)     Status: None (Preliminary result)   Collection Time: 02/23/18  4:25 PM  Result Value Ref Range Status   Specimen Description   Final    ABDOMEN ABSCESS Performed at [facility], [address], [place], [place] [address]    Special Requests   Final    Normal Performed at [facility], [place], [place] [address]    Gram Stain   Final    ABUNDANT WBC PRESENT, PREDOMINANTLY PMN FEW GRAM POSITIVE COCCI MODERATE GRAM VARIABLE ROD Performed at [facility], [address], [place], [place] [address]    Culture PENDING  Incomplete   Report Status PENDING  Incomplete    Coagulation Studies: Recent Labs    02/22/18 0641 02/23/18 0430  LABPROT 23.5* 17.6*  INR 2.11 1.46    Urinalysis: No results for input(s): COLORURINE, LABSPEC, PHURINE,  GLUCOSEU, HGBUR, BILIRUBINUR, KETONESUR, PROTEINUR, UROBILINOGEN, NITRITE, LEUKOCYTESUR in the last 72 hours.  Invalid input(s): APPERANCEUR    Imaging: Ct Image Guided Drainage By Percutaneous Catheter  Result Date: 02/24/2018 CLINICAL DATA:  Enlarging diverticular abscess EXAM: CT GUIDED DRAINAGE OF PELVIC ABSCESS ANESTHESIA/SEDATION: Intravenous Fentanyl and Versed were administered as conscious sedation during continuous monitoring of the patient's level of consciousness and physiological / cardiorespiratory status by the radiology RN, with a total moderate sedation time of 17 minutes. PROCEDURE: The procedure, risks,  benefits, and alternatives were explained to the patient. Questions regarding the procedure were encouraged and answered. The patient understands and consents to the procedure. Select axial scans through the pelvis were obtained. The collection was localized and an appropriate skin entry site was determined and marked. The operative field was prepped with chlorhexidinein a sterile fashion, and a sterile drape was applied covering the operative field. A sterile gown and sterile gloves were used for the procedure. Local anesthesia was provided with 1% Lidocaine. Under CT fluoroscopic guidance, an 18 gauge trocar needle was advanced into the collection. Purulent material could be aspirated. An Amplatz guidewire advanced easily within the collection, its position confirmed on CT. Tract dilated to facilitate placement of a 12 French pigtail catheter, formed centrally within the collection. 15 mL purulent material were aspirated and sent for Gram stain and culture. The catheter was irrigated and placed to gravity drain bag. Catheter secured externally with 0 Prolene suture and StatLock. The patient tolerated the procedure well. COMPLICATIONS: None immediate FINDINGS: The diverticular abscess adjacent to the urinary bladder was localized. Percutaneous drain catheter placed as above. IMPRESSION: 1.  Technically successful pelvic abscess drain catheter placement. Electronically Signed   By: Lucrezia Europe M.D.   On: 02/24/2018 08:06     Medications:    . amiodarone  200 mg Oral Daily  . apixaban  5 mg Oral BID  . atorvastatin  10 mg Oral QPM  . calcium acetate  1,334 mg Oral TID WC  . ceftAZIDime  2 g Intravenous Q M,W,F-1800  . febuxostat  40 mg Oral QPM  . guaiFENesin  600 mg Oral BID  . levothyroxine  50 mcg Oral QAC breakfast  . metroNIDAZOLE  500 mg Oral Q8H  . midodrine  10 mg Oral TID AC  . montelukast  10 mg Oral QPM  . multivitamin  1 tablet Oral Daily  . nystatin   Topical BID  . pantoprazole  40 mg Oral QAC breakfast  . sodium chloride flush  5 mL Intracatheter Q8H  . triamcinolone ointment   Topical BID  . umeclidinium-vilanterol  1 puff Inhalation Daily   acetaminophen, albuterol, [DISCONTINUED] guaiFENesin **AND** dextromethorphan, diphenhydrAMINE, HYDROcodone-acetaminophen, HYDROcodone-acetaminophen, ketotifen, ondansetron **OR** ondansetron (ZOFRAN) IV, pentafluoroprop-tetrafluoroeth  Assessment/ Plan:  [name] is a 77 y.o. white male with ESRD on HD MWF followed by [facility] nephrology, igA Nephropathy by biopsy 1996, COPD, GERD, hypertension, hyperlipidemia, prostate cancer, renal cell cancer (cryoablation),COPD, chronic systolic heart failure ejection fraction 35%, Atrial fibrillation   [facility]/MWF/[address]  1.  End-stage renal disease: MWF.  Dialysis for later today.    2.  Anemia of chronic kidney disease:   - Mircera as outpatient.   3.  Secondary hyperparathyroidism:  - calcium acetate with meals.   4. Diarrhea: with intraabdominal abscess on IV vancomycin, metronidazole, ceftazidime - Appreciate ID input.  - IR placed drain to abscess on 2/28.    LOS: 4 Maurice Peterson 3/1/201912:02 PM

## 2018-02-24 NOTE — Progress Notes (Signed)
Post HD assessment. Pt tolerated tx well without c/o or complications. Net UF [phone].

## 2018-02-24 NOTE — Care Management Important Message (Signed)
Important Message  Patient Details  Name: Maurice Peterson MRN: [id number] Date of Birth: 26-Nov-1941   Medicare Important Message Given:  Yes    Beverly Sessions, RN 02/24/2018, 2:37 PM

## 2018-02-24 NOTE — Progress Notes (Signed)
Initial Nutrition Assessment  DOCUMENTATION CODES:   Obesity unspecified  INTERVENTION:   Nepro po BID, each supplement provides 425 kcal and 19.1 grams of protein  500 mg Vitamin C (ascorbic acid lab pending)  Continue Rena-Vit  NUTRITION DIAGNOSIS:   Increased nutrient needs related to chronic illness(ESRD; COPD; abcess; fistula; CT drain; ) as evidenced by estimated needs.   GOAL:   Patient will meet greater than or equal to 90% of their needs   MONITOR:   Labs, Weight trends, Skin, I & O's, Supplement acceptance, PO intake  REASON FOR ASSESSMENT:   Diagnosis    ASSESSMENT:   77 yo male admitted 2/25 d/t colovesicular fistula with diverticular abscess, IgA nephropathy/ESRD (on HD MWF), acute HTN. PMH COPD, CHF, CAD, GERD, extensive prior thoracic (cardio/renal) surgical hx.   S/w pt and pt's wife:  -Pt has been on HD for 4 yrs -PTA takes Rena-vit and NEPRO (3-4/wk, likes vanilla and strawberry);  does not take vitamin C supplement -Reports no change in wt (UBW 235-240 lb);  -Poor appetite for ~20yr; easily full, denies nausea; intake 25-50%  Intake PTA:  B: Scrambled eggs, toast, coffee L: Does not always eat lunch b/c of poor appetite; sometimes eats a chicken/tuna salad sandwich D: Wife makes dinner with protein (hamburger, chicken) with veg, but pt does not eat veg Occasionally eats fruit (strawberries at dialysis) Does not typically eat snacks, but sometimes eats PB/crackers Beverage: Minute Main Punch (~4, 8oz servings/day) Likes ice chips   2/28  --CT drain placed diverticular abscess  --Per MD note, COPD is stable; Hypotension of unknown origin (possible sepsis) --Per nephrology: Pt comorbidities r/t ESRD: anemia, hyperPTH (is getting Ca acetate with meals), diarrhea with intraabdominal abcess (treated with antibiotics; IR for abscess I&D on 2/28) Pt has been treated with supplemental Vitamin K due to elevated INR prior to IR procedure;    Medications include:  Phoslo Fortaz febuxostat Synthroid Rena-vit protonix Hydrocodone (Q4hr)  Labs:  K 5.2 (H) Cl 95 (L) BUN 33 (H) Cr 7.16 (H) Ca 8.5 (L) Anion gap 17 (H) GFR 7 (L) RBC 3.99 (L) Hgb 11.7 (L) Hct 36.8 (L) Prothrombin 17.6 (H) INR 1.46 (WNL)   NUTRITION - FOCUSED PHYSICAL EXAM:    Most Recent Value  Orbital Region  Moderate depletion  Upper Arm Region  No depletion  Thoracic and Lumbar Region  No depletion  Buccal Region  Mild depletion  Temple Region  Mild depletion  Clavicle Bone Region  No depletion  Clavicle and Acromion Bone Region  No depletion  Scapular Bone Region  No depletion  Dorsal Hand  No depletion  Patellar Region  No depletion  Anterior Thigh Region  No depletion  Posterior Calf Region  No depletion  Edema (RD Assessment)  Mild  Hair  Reviewed  Eyes  Reviewed  Mouth  Reviewed  Skin  Other (Comment) [bilateral leg petechia ]  Nails  Reviewed       Diet Order:  Fall precautions Aspiration precautions Diet renal with fluid restriction Fluid restriction: 1200 mL Fluid; Room service appropriate? Yes; Fluid consistency: Thin  EDUCATION NEEDS:   Education needs have been addressed  Skin:  Skin Assessment: Skin Integrity Issues: Skin Integrity Issues:: Other (Comment) Other: CT drain for diverticular abscess; moisture damage (groin); petechiae bilateral foot/hand/arm/leg  Last BM:  2/27-- liquid (type 7)  Height:   Ht Readings from Last 1 Encounters:  02/20/18 5\' 11"  (1.803 m)    Weight:  Post Dialysis Weight:  Wt Readings  from Last 1 Encounters:  02/24/18 254 lb 3.1 oz (115.3 kg)   Wt Readings from Last 15 Encounters:  02/24/18 254 lb 3.1 oz (115.3 kg)  01/25/18 240 lb 12.8 oz (109.2 kg)  01/24/18 243 lb (110.2 kg)  01/09/18 250 lb (113.4 kg)  01/02/18 250 lb 3.2 oz (113.5 kg)  12/29/17 245 lb 4 oz (111.2 kg)  12/18/17 235 lb (106.6 kg)  12/02/17 244 lb 14.9 oz (111.1 kg)  12/01/17 245 lb (111.1 kg)   11/25/17 242 lb (109.8 kg)  11/24/17 242 lb (109.8 kg)  11/22/17 242 lb 8 oz (110 kg)  10/26/17 270 lb 11.6 oz (122.8 kg)  10/18/17 250 lb 8 oz (113.6 kg)  09/12/17 235 lb (106.6 kg)     Pt has edema r/t Chronic vasculitis-- may effect weight status  Ideal Body Weight:  78.1 kg  BMI:  Body mass index is 35.45 kg/m.  Estimated Nutritional Needs:   Kcal:  2,100-2,400 kcal  Protein:  115-127 grams  Fluid:  <1.2 Carlean Jews    Edmonia Lynch, MS Dietetic Intern Pager: [phone]

## 2018-02-24 NOTE — Consult Note (Signed)
Pharmacy consulted to resume PTA anticoagulation post IR procedure. Pt bleeding risk per IR is STANDARD. Per protocol, will resume apixaban tonight @ 2200. SubQ Heparin d/c  Maurice Peterson D Jennalee Greaves, Pharm.D, BCPS Clinical Pharmacist

## 2018-02-24 NOTE — Progress Notes (Signed)
PT Cancellation Note  Patient Details Name: Maurice Peterson MRN: [id number] DOB: [birth year]   Cancelled Treatment:       Patient at procedure or test/unavailable. Chart reviewed for re-attempt at patient evaluation. Patient currently off unit at HD, unavailable for PT encounter. PT will re-attem 02/25/2018.                   Roxanne Gates, PT, DPT 02/24/2018, 4:35 PM

## 2018-02-24 NOTE — Progress Notes (Signed)
Post HD assessment  

## 2018-02-25 LAB — CULTURE, BLOOD (ROUTINE X 2)
CULTURE: NO GROWTH
Culture: NO GROWTH
Special Requests: ADEQUATE

## 2018-02-25 LAB — VANCOMYCIN, RANDOM: VANCOMYCIN RM: 31

## 2018-02-25 MED ORDER — SODIUM CHLORIDE 0.9 % IV BOLUS (SEPSIS)
250.0000 mL | Freq: Once | INTRAVENOUS | Status: AC
  Administered 2018-02-25: 250 mL via INTRAVENOUS

## 2018-02-25 NOTE — Progress Notes (Signed)
[facility]  ROUNDING NOTE   Subjective:   Family at bedside.   Hemodialysis treatment yesterday. Tolerated treatment well. UF 1082mL  Objective:  Vital signs in last 24 hours:  Temp:  [97.8 F (36.6 C)-98.1 F (36.7 C)] 98.1 F (36.7 C) (03/02 0431) Pulse Rate:  [90-105] 98 (03/02 1052) Resp:  [18-26] 20 (03/02 0431) BP: (68-106)/(39-76) 83/56 (03/02 1052) SpO2:  [92 %-99 %] 94 % (03/02 0431) Weight:  [113.3 kg (249 lb 12.5 oz)-115.3 kg (254 lb 3.1 oz)] 113.3 kg (249 lb 12.5 oz) (03/01 1551)  Weight change:  Filed Weights   02/22/18 1342 02/24/18 1200 02/24/18 1551  Weight: 115.1 kg (253 lb 12 oz) 115.3 kg (254 lb 3.1 oz) 113.3 kg (249 lb 12.5 oz)    Intake/Output: I/O last 3 completed shifts: In: 75 [P.O.:360; I.V.:5; IV Piggyback:530] Out: 7425 [Urine:100; Drains:47; ZDGLO:7564]   Intake/Output this shift:  No intake/output data recorded.  Physical Exam: General: NAD,   Head: Normocephalic, atraumatic. Moist oral mucosal membranes  Eyes: Anicteric, PERRL  Neck: Supple, trachea midline  Lungs:  Clear to auscultation  Heart: Regular rate and rhythm  Abdomen:  Soft, nontender, left LQ abdominal drain  Extremities: no peripheral edema.  Neurologic: Nonfocal, moving all four extremities  Skin: +erythema petechial lesions       Basic Metabolic Panel: Recent Labs  Lab 02/20/18 0449 02/21/18 0413 02/22/18 0641 02/24/18 0505  NA 133* 133* 135 135  K 5.4* 5.9* 5.7* 5.2*  CL 95* 97* 97* 95*  CO2 23 19* 21* 23  GLUCOSE 125* 79 81 96  BUN 42* 49* 42* 33*  CREATININE 7.84* 8.89* 7.65* 7.16*  CALCIUM 8.4* 8.6* 8.4* 8.5*  MG 2.3  --   --   --     Liver Function Tests: Recent Labs  Lab 02/20/18 0449  AST 32  ALT 22  ALKPHOS 66  BILITOT 1.0  PROT 7.2  ALBUMIN 2.8*   No results for input(s): LIPASE, AMYLASE in the last 168 hours. No results for input(s): AMMONIA in the last 168 hours.  CBC: Recent Labs  Lab 02/20/18 0449  02/22/18 0641 02/24/18 0505  WBC 12.6* 11.0* 12.0*  NEUTROABS 10.3*  --   --   HGB 11.2* 10.6* 11.7*  HCT 35.4* 33.2* 36.8*  MCV 92.5 91.5 92.3  PLT 232 219 250    Cardiac Enzymes: Recent Labs  Lab 02/20/18 0449 02/20/18 1146  TROPONINI 0.03* 0.03*    BNP: Invalid input(s): POCBNP  CBG: No results for input(s): GLUCAP in the last 168 hours.  Microbiology: Results for orders placed or performed during the hospital encounter of 02/20/18  Urine culture     Status: Abnormal   Collection Time: 02/20/18  3:29 AM  Result Value Ref Range Status   Specimen Description   Final    URINE, RANDOM Performed at [facility], [address], [place], [place] [address]    Special Requests   Final    NONE Performed at [facility], [address] [place], [place] [address]    Culture MULTIPLE SPECIES PRESENT, SUGGEST RECOLLECTION (A)  Final   Report Status 02/22/2018 FINAL  Final  C difficile quick scan w PCR reflex     Status: Abnormal   Collection Time: 02/20/18  3:29 AM  Result Value Ref Range Status   C Diff antigen POSITIVE (A) NEGATIVE Final   C Diff toxin NEGATIVE NEGATIVE Final   C Diff interpretation Results are indeterminate. See PCR results.  Final    Comment: Performed at [facility], [address] [place], [place] [address]  C. Diff by PCR, Reflexed     Status: None   Collection Time: 02/20/18  3:29 AM  Result Value Ref Range Status   Toxigenic C. Difficile by PCR NEGATIVE NEGATIVE Final    Comment: Patient is colonized with non toxigenic C. difficile. May not need treatment unless significant symptoms are present. Performed at [facility], [address] [place], [place] [address]   Blood culture (routine x 2)     Status: None   Collection Time: 02/20/18  8:16 AM  Result Value Ref Range Status   Specimen Description BLOOD RIGHT ANTECUBITAL  Final   Special Requests   Final    BOTTLES DRAWN AEROBIC AND ANAEROBIC Blood  Culture results may not be optimal due to an excessive volume of blood received in culture bottles   Culture   Final    NO GROWTH 5 DAYS Performed at [facility], [address] [place], [place] [address]    Report Status 02/25/2018 FINAL  Final  Blood culture (routine x 2)     Status: None   Collection Time: 02/20/18  9:20 AM  Result Value Ref Range Status   Specimen Description BLOOD BLOOD RIGHT ARM  Final   Special Requests   Final    BOTTLES DRAWN AEROBIC AND ANAEROBIC Blood Culture adequate volume   Culture   Final    NO GROWTH 5 DAYS Performed at [facility], [address] [place], [place] [address]    Report Status 02/25/2018 FINAL  Final  MRSA PCR Screening     Status: None   Collection Time: 02/20/18  6:02 PM  Result Value Ref Range Status   MRSA by PCR NEGATIVE NEGATIVE Final    Comment:        The GeneXpert MRSA Assay (FDA approved for NASAL specimens only), is one component of a comprehensive MRSA colonization surveillance program. It is not intended to diagnose MRSA infection nor to guide or monitor treatment for MRSA infections. Performed at [facility], [address] [place], [place] [address]   Aerobic/Anaerobic Culture (surgical/deep wound)     Status: None (Preliminary result)   Collection Time: 02/23/18  4:25 PM  Result Value Ref Range Status   Specimen Description   Final    ABDOMEN ABSCESS Performed at [facility], [address] [place], [place] [address]    Special Requests   Final    Normal Performed at [facility], [address] [place], [place] [address]    Gram Stain   Final    ABUNDANT WBC PRESENT, PREDOMINANTLY PMN FEW GRAM POSITIVE COCCI MODERATE GRAM VARIABLE ROD    Culture   Final    TOO YOUNG TO READ Performed at [facility], [place] [address], [place], [place] [address]    Report Status PENDING  Incomplete    Coagulation Studies: Recent Labs    02/23/18 0430   LABPROT 17.6*  INR 1.46    Urinalysis: No results for input(s): COLORURINE, LABSPEC, PHURINE, GLUCOSEU, HGBUR, BILIRUBINUR, KETONESUR, PROTEINUR, UROBILINOGEN, NITRITE, LEUKOCYTESUR in the last 72 hours.  Invalid input(s): APPERANCEUR    Imaging: Ct Image Guided Drainage By Percutaneous Catheter  Result Date: 02/24/2018 CLINICAL DATA:  Enlarging diverticular abscess EXAM: CT GUIDED DRAINAGE OF PELVIC ABSCESS ANESTHESIA/SEDATION: Intravenous Fentanyl and Versed were administered as conscious sedation during continuous monitoring of the patient's level of consciousness  and physiological / cardiorespiratory status by the radiology RN, with a total moderate sedation time of 17 minutes. PROCEDURE: The procedure, risks, benefits, and alternatives were explained to the patient. Questions regarding the procedure were encouraged and answered. The patient understands and consents to the procedure. Select axial scans through the pelvis were obtained. The collection was localized and an appropriate skin entry site was determined and marked. The operative field was prepped with chlorhexidinein a sterile fashion, and a sterile drape was applied covering the operative field. A sterile gown and sterile gloves were used for the procedure. Local anesthesia was provided with 1% Lidocaine. Under CT fluoroscopic guidance, an 18 gauge trocar needle was advanced into the collection. Purulent material could be aspirated. An Amplatz guidewire advanced easily within the collection, its position confirmed on CT. Tract dilated to facilitate placement of a 12 French pigtail catheter, formed centrally within the collection. 15 mL purulent material were aspirated and sent for Gram stain and culture. The catheter was irrigated and placed to gravity drain bag. Catheter secured externally with 0 Prolene suture and StatLock. The patient tolerated the procedure well. COMPLICATIONS: None immediate FINDINGS: The diverticular abscess  adjacent to the urinary bladder was localized. Percutaneous drain catheter placed as above. IMPRESSION: 1. Technically successful pelvic abscess drain catheter placement. Electronically Signed   By: Lucrezia Europe M.D.   On: 02/24/2018 08:06     Medications:    . amiodarone  200 mg Oral Daily  . atorvastatin  10 mg Oral QPM  . calcium acetate  1,334 mg Oral TID WC  . ceftAZIDime  2 g Intravenous Q M,W,F-1800  . febuxostat  40 mg Oral QPM  . feeding supplement (NEPRO CARB STEADY)  237 mL Oral BID BM  . guaiFENesin  600 mg Oral BID  . levothyroxine  50 mcg Oral QAC breakfast  . metroNIDAZOLE  500 mg Oral Q8H  . midodrine  10 mg Oral TID AC  . montelukast  10 mg Oral QPM  . multivitamin  1 tablet Oral Daily  . nystatin   Topical BID  . pantoprazole  40 mg Oral QAC breakfast  . sodium chloride flush  5 mL Intracatheter Q8H  . triamcinolone ointment   Topical BID  . umeclidinium-vilanterol  1 puff Inhalation Daily  . vitamin C  500 mg Oral BID   acetaminophen, albuterol, [DISCONTINUED] guaiFENesin **AND** dextromethorphan, diphenhydrAMINE, HYDROcodone-acetaminophen, HYDROcodone-acetaminophen, ketotifen, ondansetron **OR** ondansetron (ZOFRAN) IV, pentafluoroprop-tetrafluoroeth  Assessment/ Plan:  Maurice Peterson is a 77 y.o. 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  1.  End-stage renal disease: MWF.   2.  Anemia of chronic kidney disease:   - Mircera as outpatient.   3.  Secondary hyperparathyroidism:  - calcium acetate with meals.   4. Diarrhea: with intraabdominal abscess on IV vancomycin, metronidazole, ceftazidime - Appreciate ID and surgery input - IR placed drain to abscess on 2/28.    LOS: 5 Nadege Carriger 3/2/201911:36  AM

## 2018-02-25 NOTE — Evaluation (Signed)
Physical Therapy Evaluation Patient Details Name: Maurice Peterson MRN: [id number] DOB: January 22, 1941 Today's Date: 02/25/2018   History of Present Illness  Pt is a 77 year old male admitted with a colovesical fistula following being found hypotensive at a hemodialysis appointment.  PMH includes arthritis, atrial flutter, CAD, HLD, COPD, Htn, ESRD, GERD ischemic cardiomyopathy and prostate CA.  Clinical Impression  Pt is a 77 year old male who lives in a one story home with his wife.  He is in bed upon PT arrival and able to perform bed mobility with use of bed rail and increased time.  Pt has a draining bag which is attached to the L lower quadrant of his abdomen and needed to be re-attached by RN after pt attempted a STS.  Pt was able to perform STS from bed with 2-3 attempts and VC's for safe use of RW.  Pt ambulated 40 ft in room and PT noted gait and posture deviations which indicate fall risk.  TUG time also indicates fall risk for pt with home and community ambulation.  Pt presented with WNL strength of UE and LE and reported mild sensation loss in bilateral feet.  PT educated pt concerning proper use of RW related to fall prevention and adjusted pt's walker to appropriate height.  PT also educated pt concerning [facility] pulmonary rehab program which pt is interested in starting following DC from home health PT.  Pt will continue to benefit from skilled PT with focus on balance, safe use of RW, tolerance to activity, core strength and functional mobility.    Follow Up Recommendations Home health PT(Pt would also benefit from [facility] pulmonary rehab program at [facility].)    Equipment Recommendations       Recommendations for Other Services       Precautions / Restrictions Precautions Precautions: Fall Restrictions Weight Bearing Restrictions: No      Mobility  Bed Mobility Overal bed mobility: Modified Independent             General bed mobility comments: Requires increased time and  use of bed rail.  Transfers Overall transfer level: Needs assistance Equipment used: Rolling walker (2 wheeled) Transfers: Sit to/from Stand Sit to Stand: Supervision         General transfer comment: Pt required multiple attempts but was able to initiate STS.  PT provided several VC's for safe hand placement and proper sequencing when performing STS with RW.  PT also adjusted pt's RW and educated pt concerning proper RW settings related to safe transfers and ambulation.  Pt presents with low foot clearance and out toeing of bilateral LE's during gait.  Ambulation/Gait Ambulation/Gait assistance: Supervision Ambulation Distance (Feet): 40 Feet Assistive device: Rolling walker (2 wheeled)     Gait velocity interpretation: Below normal speed for age/gender General Gait Details: Pt presents with a flexed posture and tendency to push RW in front of him during gait.  PT educated pt concerning proper placement of RW and tendency to do this when using a rollator, which is what the pt has at home.  He is going to use his sister's RW and PT advised pt to have the walker present during his home health PT evaluation so that it can be adjusted properly.  Stairs            Wheelchair Mobility    Modified Rankin (Stroke Patients Only)       Balance Overall balance assessment: Needs assistance Sitting-balance support: Bilateral upper extremity supported  Standing balance support: Bilateral upper extremity supported   Standing balance comment: Uses RW for balance                 Standardized Balance Assessment Standardized Balance Assessment : TUG: Timed Up and Go Test     Timed Up and Go Test TUG: Normal TUG Normal TUG (seconds): 17     Pertinent Vitals/Pain Pain Assessment: No/denies pain    Home Living Family/patient expects to be discharged to:: Private residence Living Arrangements: Spouse/significant other Available Help at Discharge: Family Type of  Home: House Home Access: Stairs to enter Entrance Stairs-Rails: Can reach both Entrance Stairs-Number of Steps: 3 Home Layout: One level Home Equipment: Grab bars - tub/shower;Shower seat;Cane - quad;Cane - single point      Prior Function Level of Independence: Independent with assistive device(s)         Comments: Using RW to get from car to dialysis, has two canes in house     Hand Dominance   Dominant Hand: Right    Extremity/Trunk Assessment   Upper Extremity Assessment Upper Extremity Assessment: Overall WFL for tasks assessed    Lower Extremity Assessment Lower Extremity Assessment: Overall WFL for tasks assessed(Reports sensation loss in bilateral feet.)    Cervical / Trunk Assessment Cervical / Trunk Assessment: Kyphotic(Pt presents with a kyphotic, flexed posture and requires multiple VC's for upright posture to use RW safely.)  Communication   Communication: No difficulties  Cognition Arousal/Alertness: Awake/alert Behavior During Therapy: WFL for tasks assessed/performed Overall Cognitive Status: Within Functional Limits for tasks assessed                                        General Comments      Exercises     Assessment/Plan    PT Assessment Patient needs continued PT services  PT Problem List Decreased strength;Decreased mobility;Decreased activity tolerance;Decreased balance;Decreased knowledge of use of DME;Pain       PT Treatment Interventions DME instruction;Therapeutic activities;Gait training;Therapeutic exercise;Stair training;Balance training;Functional mobility training;Neuromuscular re-education;Patient/family education    PT Goals (Current goals can be found in the Care Plan section)  Acute Rehab PT Goals Patient Stated Goal: To return home and try to do PT and, eventually, [facility] PT Goal Formulation: With patient/family Time For Goal Achievement: 04-05-2018 Potential to Achieve Goals: Good    Frequency Min  2X/week   Barriers to discharge        Co-evaluation               AM-PAC PT "6 Clicks" Daily Activity  Outcome Measure Difficulty turning over in bed (including adjusting bedclothes, sheets and blankets)?: A Little Difficulty moving from lying on back to sitting on the side of the bed? : A Little Difficulty sitting down on and standing up from a chair with arms (e.g., wheelchair, bedside commode, etc,.)?: A Little Help needed moving to and from a bed to chair (including a wheelchair)?: A Little Help needed walking in hospital room?: A Little Help needed climbing 3-5 steps with a railing? : A Little 6 Click Score: 18    End of Session Equipment Utilized During Treatment: Gait belt Activity Tolerance: Patient limited by fatigue Patient left: in chair;with call bell/phone within reach;with chair alarm set;with family/visitor present Nurse Communication: Mobility status PT Visit Diagnosis: Unsteadiness on feet (R26.81);History of falling (Z91.81)    Time: 1250-1310 PT Time Calculation (min) (  ACUTE ONLY): 20 min   Charges:   PT Evaluation $PT Eval Low Complexity: 1 Low PT Treatments $Therapeutic Activity: 8-22 mins   PT G Codes:   PT G-Codes **NOT FOR INPATIENT CLASS** Functional Assessment Tool Used: AM-PAC 6 Clicks Basic Mobility   Roxanne Gates, PT, DPT   Roxanne Gates 02/25/2018, 1:18 PM

## 2018-02-25 NOTE — Consult Note (Signed)
Pharmacy Antibiotic Note  Maurice Peterson is a 77 y.o. male admitted on 02/20/2018 with intraabdominal abscess, with colovesicular fistula .  Pharmacy has been consulted for vancomycin dosing. Pt was admitted to [facility] and d/c on vancomycin, ceftazidime, and po flagyl until 3/19 when he is to follow up with [facility] ID. Pt was admitted for hypotension. Pt on transfer list to [facility], but currently no bed available.  Plan: Pt was on vancomycin 1500mg  and ceftaz 2g after HD and metronidazole 500mg  TID Continue ceftaz and metronidazole as above. Pt will need an additional ceftaz dose today after HD. Will order a 1 time 1g dose for tonight and resume 2g AFTER HD tomorrow Pt was given 2g of vancomycin in the ED, did not receive HD yeterday. Plans to get HD today and then back to normal MWF schedule tomorrow. I will draw a random vanc level today AFTER HD to determine if pt needs a supplemental dose after HD today since pt received extra dose yesterday. Follow up level tonight  02/27 @ 0038 VR 42, goal post HD session < 20 - 25 mcg/mL. Will hold off on vanc and draw another post HD level @ 2200. Dialysis should take off 40 - 50% of vanc, so tomorrow evening patient should be down to 21 mcg/mL. If level comes back < 20 - 25 mcg/mL will start vanc 1g qMWF after HD.  02/27 @ 2300 VR 33, goal post HD < 20 - 25 mcg/mL. Dialysis took off 21% of dose. Will continue to hold off on vanc until level is at least < 25 mcg/mL to redose. Will draw another level post HD 03/01.  03/01 @ 2200 VR 31, goal post HD < 20 - 25 mcg/mL. Patient has been going for dialysis w/o any subsequent doses of vanc (last dose vanc 2g 2/25 @ 9242), but level has only dropped by 2 mcg/mL. Will continue to hold off and recheck another post HD level 03/04 @ 2200.   Height: 5\' 11"  (180.3 cm) Weight: 249 lb 12.5 oz (113.3 kg) IBW/kg (Calculated) : 75.3  Temp (24hrs), Avg:98 F (36.7 C), Min:97.8 F (36.6 C), Max:98.1 F (36.7 C)  Recent Labs  Lab  02/20/18 0449 02/20/18 0816 02/21/18 0413  02/22/18 0641 02/22/18 2300 02/24/18 0505 02/24/18 2206  WBC 12.6*  --   --   --  11.0*  --  12.0*  --   CREATININE 7.84*  --  8.89*  --  7.65*  --  7.16*  --   LATICACIDVEN  --  1.6  --   --   --   --   --   --   VANCORANDOM  --   --   --    < >  --  33  --  31   < > = values in this interval not displayed.    Estimated Creatinine Clearance: 11.2 mL/min (A) (by C-G formula based on SCr of 7.16 mg/dL (H)).    Allergies  Allergen Reactions  . Nsaids Nausea And Vomiting    Anti-inflammatories.  . Amoxicillin-Pot Clavulanate Nausea And Vomiting    Has patient had a PCN reaction causing immediate rash, facial/tongue/throat swelling, SOB or lightheadedness with hypotension: No Has patient had a PCN reaction causing severe rash involving mucus membranes or skin necrosis: No Has patient had a PCN reaction that required hospitalization:Yes-vomiting blood Has patient had a PCN reaction occurring within the last 10 years: Yes If all of the above answers are "NO", then may proceed  with Cephalosporin use.     Antimicrobials this admission: vancomycin  >>  ceftaz >>  Metronidazole >> cipro 2/25>>2/26  Dose adjustments this admission:   Microbiology results: 2/25 BCx: NG <24hr 2/25 MRSA PCR: neg 2/25 cdiff antigen positive, toxin neg- PCR pending  Thank you for allowing pharmacy to be a part of this patient's care.  Tobie Lords, PharmD, BCPS Clinical Pharmacist 02/25/2018

## 2018-02-25 NOTE — Consult Note (Signed)
Pharmacy Antibiotic Note  Maurice Peterson is a 77 y.o. male admitted on 02/20/2018 with intraabdominal abscess, with colovesicular fistula .  Pharmacy has been consulted for vancomycin dosing. Pt was admitted to [facility] and d/c on vancomycin, ceftazidime, and po flagyl until 3/19 when he is to follow up with [facility] ID. Pt was admitted for hypotension. Pt on transfer list to [facility], but currently no bed available.  Plan: Pt was on vancomycin 1500mg  and ceftaz 2g after HD and metronidazole 500mg  TID Continue ceftaz and metronidazole as above. Pt will need an additional ceftaz dose today after HD. Will order a 1 time 1g dose for tonight and resume 2g AFTER HD tomorrow Pt was given 2g of vancomycin in the ED, did not receive HD yeterday. Plans to get HD today and then back to normal MWF schedule tomorrow. I will draw a random vanc level today AFTER HD to determine if pt needs a supplemental dose after HD today since pt received extra dose yesterday. Follow up level tonight  02/27 @ 0038 VR 42, goal post HD session < 20 - 25 mcg/mL. Will hold off on vanc and draw another post HD level @ 2200. Dialysis should take off 40 - 50% of vanc, so tomorrow evening patient should be down to 21 mcg/mL. If level comes back < 20 - 25 mcg/mL will start vanc 1g qMWF after HD.  02/27 @ 2300 VR 33, goal post HD < 20 - 25 mcg/mL. Dialysis took off 21% of dose. Will continue to hold off on vanc until level is at least < 25 mcg/mL to redose. Will draw another level post HD 03/01.  03/01 @ 2200 VR 31, goal post HD < 20 - 25 mcg/mL. Patient has been going for dialysis, but level has only dropped by 2 mcg/mL. Will continue to hold off and recheck another post HD level 03/04 @ 2200.   Height: 5\' 11"  (180.3 cm) Weight: 249 lb 12.5 oz (113.3 kg) IBW/kg (Calculated) : 75.3  Temp (24hrs), Avg:97.9 F (36.6 C), Min:97.8 F (36.6 C), Max:98.1 F (36.7 C)  Recent Labs  Lab 02/20/18 0449 02/20/18 0816 02/21/18 0413  02/22/18 0641  02/22/18 2300 02/24/18 0505 02/24/18 2206  WBC 12.6*  --   --   --  11.0*  --  12.0*  --   CREATININE 7.84*  --  8.89*  --  7.65*  --  7.16*  --   LATICACIDVEN  --  1.6  --   --   --   --   --   --   VANCORANDOM  --   --   --    < >  --  33  --  31   < > = values in this interval not displayed.    Estimated Creatinine Clearance: 11.2 mL/min (A) (by C-G formula based on SCr of 7.16 mg/dL (H)).    Allergies  Allergen Reactions  . Nsaids Nausea And Vomiting    Anti-inflammatories.  . Amoxicillin-Pot Clavulanate Nausea And Vomiting    Has patient had a PCN reaction causing immediate rash, facial/tongue/throat swelling, SOB or lightheadedness with hypotension: No Has patient had a PCN reaction causing severe rash involving mucus membranes or skin necrosis: No Has patient had a PCN reaction that required hospitalization:Yes-vomiting blood Has patient had a PCN reaction occurring within the last 10 years: Yes If all of the above answers are "NO", then may proceed with Cephalosporin use.     Antimicrobials this admission: vancomycin  >>  ceftaz >>  Metronidazole >> cipro 2/25>>2/26  Dose adjustments this admission:   Microbiology results: 2/25 BCx: NG <24hr 2/25 MRSA PCR: neg 2/25 cdiff antigen positive, toxin neg- PCR pending  Thank you for allowing pharmacy to be a part of this patient's care.  Tobie Lords, PharmD, BCPS Clinical Pharmacist 02/25/2018

## 2018-02-25 NOTE — Progress Notes (Signed)
Pt was given D/C instructions and told about his follow up appts and he stated he will comply. I taught his wife how to flush the JP drain once a day with 5 ml of normal saline. I gave her enough 10 ml saline to last until ome health could come to the home. I removed his IV without incident. The pt's BP was 72/47 and I told him that as long as it was not below 70 for systolic Dr. Tressia Miners said he could go home. I alerted them to check his BP tonight and prior to taking his amiodarone.

## 2018-02-26 NOTE — Discharge Summary (Signed)
[facility] [place] NAME: Maurice Peterson    MR#:  [id number]  DATE OF BIRTH:  01-27-1941  DATE OF ADMISSION:  02/20/2018   ADMITTING PHYSICIAN: Gorden Harms, MD  DATE OF DISCHARGE: 02/25/2018  2:34 PM  PRIMARY CARE PHYSICIAN: Baxter Hire, MD   ADMISSION DIAGNOSIS:   trouble breathing  DISCHARGE DIAGNOSIS:   Active Problems:   Diverticulitis of large intestine with abscess without bleeding   Colovesical fistula   Intraperitoneal abscess ([place])   SECONDARY DIAGNOSIS:   Past Medical History:  Diagnosis Date  . Arthritis   . Atrial flutter ([place])    a. s/p DCCV 07/2017 briefly successful; b. amio and warfarin; c. CHADS2VASc => 5 (CHF, HTN, age x 2, vascular disease);  [name] CAD (coronary artery disease)    a. remote inferior MI s/p stenting; b. Myoview in 10/2016 with normal perfusion with an EF of 46%  . CHF (congestive heart failure) ([place])   . COPD (chronic obstructive pulmonary disease) ([place])   . ESRD (end stage renal disease) on dialysis ([place])    a. HD MWF  . GERD (gastroesophageal reflux disease)   . Hyperlipidemia   . Hypertension   . Ischemic cardiomyopathy    a. TTE 05/2017: EF 35-40%, unable to exlcude RWMA, mild MR, mildly dilated RV  . Prostate cancer ([place])   . Renal insufficiency   . 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 Hypotension- secondary to sepsis. Has baseline hypotension - Due to colo-vesical fistula and abscess in the location. Previous drainage attempted by IR was unsuccessful. -CT abd showed increase in size of the fluid collection. - s/p CT guided drain placement in the abscess and  feculent material drained, some sanguinous discharged noted. -Appreciate ID consult. He was started on vancomycin, ceftaz with dialysis and also Flagyl at [facility] 10 days ago. We'll continue them for now as recommended until 03/14/18 -Appreciate surgical consult for the same -Patient does have known history of hypotension, he is on midodrine which we will continue  2. Atrial flutter-rate controlled. Continue amiodarone. On Toprol which is held due to hypotension  -Eliquis restarted at discharge  3. 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 Being followed by [facility] dermatology. Has had steroids in the past. In the process of starting immunosuppressants once his infection is treated -topical triamcinolone ordered with improvement in the rash  4. COPD-stable, nebs prn Cough meds, improved with nebs  5. End-stage renal disease-appreciate nephrology consult. Patient on Monday, Wednesday and Friday dialysis.  - Dialysis today per schedule  PT recommended -home health at discharge    DISCHARGE CONDITIONS:   Guarded  CONSULTS OBTAINED:   Treatment Team:  Leonel Ramsay, MD Lavonia Dana, MD  DRUG ALLERGIES:   Allergies  Allergen Reactions  . Nsaids Nausea And Vomiting    Anti-inflammatories.  . Amoxicillin-Pot Clavulanate Nausea And Vomiting    Has patient had a PCN reaction causing immediate rash, facial/tongue/throat swelling, SOB or lightheadedness with hypotension: No Has patient had a PCN reaction causing severe rash involving mucus membranes or skin necrosis: No Has patient had a PCN reaction that required hospitalization:Yes-vomiting blood Has patient had a PCN reaction occurring within the last 10 years:  Yes If all of the above answers are "NO", then may proceed with Cephalosporin use.    DISCHARGE MEDICATIONS:   Allergies as of 02/25/2018      Reactions   Nsaids Nausea And Vomiting   Anti-inflammatories.   Amoxicillin-pot Clavulanate Nausea  And Vomiting   Has patient had a PCN reaction causing immediate rash, facial/tongue/throat swelling, SOB or lightheadedness with hypotension: No Has patient had a PCN reaction causing severe rash involving mucus membranes or skin necrosis: No Has patient had a PCN reaction that required hospitalization:Yes-vomiting blood Has patient had a PCN reaction occurring within the last 10 years: Yes If all of the above answers are "NO", then may proceed with Cephalosporin use.      Medication List    TAKE these medications   acetaminophen 500 MG tablet Commonly known as:  TYLENOL Take 1,000 mg by mouth daily as needed for moderate pain or headache.   albuterol 108 (90 Base) MCG/ACT inhaler Commonly known as:  PROVENTIL HFA;VENTOLIN HFA Inhale 2 puffs into the lungs every 4 (four) hours as needed for wheezing or shortness of breath.   amiodarone 200 MG tablet Commonly known as:  PACERONE Take 1 tablet (200 mg) by mouth once daily   ANORO ELLIPTA 62.5-25 MCG/INH Aepb Generic drug:  umeclidinium-vilanterol Inhale 1 puff into the lungs daily.   apixaban 5 MG Tabs tablet Commonly known as:  ELIQUIS Take 1 tablet (5 mg total) by mouth 2 (two) times daily.   atorvastatin 10 MG tablet Commonly known as:  LIPITOR Take 10 mg by mouth every evening.   azelastine 0.05 % ophthalmic solution Commonly known as:  OPTIVAR Place 1 drop into both eyes 2 (two) times daily as needed (allergies).   calcium acetate 667 MG capsule Commonly known as:  PHOSLO Take 1,334 mg by mouth 3 (three) times daily with meals.   cefTAZidime 2 g in sodium chloride 0.9 % 100 mL Inject 2 g into the vein every Monday, Wednesday, and Friday. After dialysis   dextromethorphan-guaiFENesin 30-600 MG 12hr tablet Commonly known as:  MUCINEX DM Take 1 tablet by mouth at bedtime as needed for cough.   ethyl chloride spray APPLY A SMALL AMOUNT ON SKIN BEFORE NEEDLE INSERTION THREE TIMES PER WEEK   febuxostat 40 MG  tablet Commonly known as:  ULORIC Take 40 mg by mouth every evening.   levothyroxine 50 MCG tablet Commonly known as:  SYNTHROID Take 1 tablet (50 mcg total) by mouth daily before breakfast.   metroNIDAZOLE 500 MG tablet Commonly known as:  FLAGYL Take 500 mg by mouth 3 (three) times daily.   midodrine 10 MG tablet Commonly known as:  PROAMATINE Take 10 mg by mouth 3 (three) times daily.   montelukast 10 MG tablet Commonly known as:  SINGULAIR Take 10 mg by mouth every evening.   multivitamin Tabs tablet Take 1 tablet by mouth daily.   pantoprazole 40 MG tablet Commonly known as:  PROTONIX Take 40 mg by mouth daily before breakfast.   ZZZQUIL PO Take 25 mg by mouth at bedtime as needed (sleep).        DISCHARGE INSTRUCTIONS:   1. PCP follow-up in once 2 weeks 2. Follow up for For dialysis as per schedule 3. Follow-up with [facility] surgery and ID as per schedule.   DIET:   Renal diet  ACTIVITY:   Activity as tolerated  OXYGEN:   Home Oxygen: No.  Oxygen Delivery: room air  DISCHARGE LOCATION:   home  If you experience worsening of your admission symptoms, develop shortness of breath, life threatening emergency, suicidal or homicidal thoughts you must seek medical attention immediately by calling 911 or calling your MD immediately  if symptoms less severe.  You Must read complete instructions/literature along with all the possible adverse reactions/side effects for all the Medicines you take and that have been prescribed to you. Take any new Medicines after you have completely understood and accpet all the possible adverse reactions/side effects.   Please note  You were cared for by a hospitalist during your hospital stay. If you have any questions about your discharge medications or the care you received while you were in the hospital after you are discharged, you can call the unit and asked to speak with the hospitalist on call if the hospitalist that took  care of you is not available. Once you are discharged, your primary care physician will handle any further medical issues. Please note that NO REFILLS for any discharge medications will be authorized once you are discharged, as it is imperative that you return to your primary care physician (or establish a relationship with a primary care physician if you do not have one) for your aftercare needs so that they can reassess your need for medications and monitor your lab values.    On the day of Discharge:  VITAL SIGNS:   Blood pressure (!) 72/47, pulse (!) 102, temperature 97.7 F (36.5 C), temperature source Oral, resp. rate 18, height 5\' 11"  (1.803 m), weight 113.3 kg (249 lb 12.5 oz), SpO2 96 %.  PHYSICAL EXAMINATION:   GENERAL:  77 y.o.-year-old patient lying in the bed with no acute distress.  EYES: Pupils equal, round, reactive to light and accommodation. No scleral icterus. Extraocular muscles intact.  HEENT: Head atraumatic, normocephalic. Oropharynx and nasopharynx clear.  NECK:  Supple, no jugular venous distention. No thyroid enlargement, no tenderness.  LUNGS: Normal breath sounds bilaterally, no  wheezing noted, no  rales,rhonchi or crepitation. No use of accessory muscles of respiration. Significantly decreased at the bases CARDIOVASCULAR: S1, S2 normal. No  rubs, or gallops. 3/6 systolic murmur present ABDOMEN: Soft, nontender, nondistended. Bowel sounds present. No organomegaly or mass.  EXTREMITIES: No  cyanosis, or clubbing. 1+ pedal edema noted. Vasculitis rash on both feet- fading now Left forearm AV fistula noted NEUROLOGIC: Cranial nerves II through XII are intact. Muscle strength 5/5 in all extremities. Sensation intact. Gait not checked.  PSYCHIATRIC: The patient is alert and oriented x 3.  SKIN: No obvious rash, lesion, or ulcer.      DATA REVIEW:   CBC Recent Labs  Lab 02/24/18 0505  WBC 12.0*  HGB 11.7*  HCT 36.8*  PLT 250    Chemistries  Recent Labs   Lab 02/20/18 0449  02/24/18 0505  NA 133*   < > 135  K 5.4*   < > 5.2*  CL 95*   < > 95*  CO2 23   < > 23  GLUCOSE 125*   < > 96  BUN 42*   < > 33*  CREATININE 7.84*   < > 7.16*  CALCIUM 8.4*   < > 8.5*  MG 2.3  --   --   AST 32  --   --   ALT 22  --   --   ALKPHOS 66  --   --   BILITOT 1.0  --   --    < > = values in this interval not displayed.  Microbiology Results  Results for orders placed or performed during the hospital encounter of 02/20/18  Urine culture     Status: Abnormal   Collection Time: 02/20/18  3:29 AM  Result Value Ref Range Status   Specimen Description   Final    URINE, RANDOM Performed at [facility], [address], [place], [place] [address]    Special Requests   Final    NONE Performed at [facility], [address] [place], [place] [address]    Culture MULTIPLE SPECIES PRESENT, SUGGEST RECOLLECTION (A)  Final   Report Status 02/22/2018 FINAL  Final  C difficile quick scan w PCR reflex     Status: Abnormal   Collection Time: 02/20/18  3:29 AM  Result Value Ref Range Status   C Diff antigen POSITIVE (A) NEGATIVE Final   C Diff toxin NEGATIVE NEGATIVE Final   C Diff interpretation Results are indeterminate. See PCR results.  Final    Comment: Performed at [facility], [address] [place], [place] [address]  C. Diff by PCR, Reflexed     Status: None   Collection Time: 02/20/18  3:29 AM  Result Value Ref Range Status   Toxigenic C. Difficile by PCR NEGATIVE NEGATIVE Final    Comment: Patient is colonized with non toxigenic C. difficile. May not need treatment unless significant symptoms are present. Performed at [facility], [address] [place], [place] [address]   Blood culture (routine x 2)     Status: None   Collection Time: 02/20/18  8:16 AM  Result Value Ref Range Status   Specimen Description BLOOD RIGHT ANTECUBITAL  Final   Special Requests   Final    BOTTLES DRAWN AEROBIC AND  ANAEROBIC Blood Culture results may not be optimal due to an excessive volume of blood received in culture bottles   Culture   Final    NO GROWTH 5 DAYS Performed at [facility], [address] [place], [place] [address]    Report Status 02/25/2018 FINAL  Final  Blood culture (routine x 2)     Status: None   Collection Time: 02/20/18  9:20 AM  Result Value Ref Range Status   Specimen Description BLOOD BLOOD RIGHT ARM  Final   Special Requests   Final    BOTTLES DRAWN AEROBIC AND ANAEROBIC Blood Culture adequate volume   Culture   Final    NO GROWTH 5 DAYS Performed at [facility], [address] [place], [place] [address]    Report Status 02/25/2018 FINAL  Final  MRSA PCR Screening     Status: None   Collection Time: 02/20/18  6:02 PM  Result Value Ref Range Status   MRSA by PCR NEGATIVE NEGATIVE Final    Comment:        The GeneXpert MRSA Assay (FDA approved for NASAL specimens only), is one component of a comprehensive MRSA colonization surveillance program. It is not intended to diagnose MRSA infection nor to guide or monitor treatment for MRSA infections. Performed at [facility], [address] [place], [place] [address]   Aerobic/Anaerobic Culture (surgical/deep wound)     Status: None (Preliminary result)   Collection Time: 02/23/18  4:25 PM  Result Value Ref Range Status   Specimen Description   Final    ABDOMEN ABSCESS Performed at [facility], [address] [place], [place] [address]    Special Requests   Final    Normal Performed at  [place], [place] [address]    Gram Stain   Final    ABUNDANT WBC PRESENT, PREDOMINANTLY PMN FEW GRAM POSITIVE COCCI MODERATE GRAM VARIABLE ROD Performed at [facility], [place] [address] [place], [place] [address]    Culture   Final    CULTURE REINCUBATED FOR BETTER GROWTH NO ANAEROBES ISOLATED; CULTURE IN PROGRESS FOR 5 DAYS     Report Status PENDING  Incomplete    RADIOLOGY:  No results found.   Management plans discussed with the patient, family and they are in agreement.  CODE STATUS:  Code Status History    Date Active Date Inactive Code Status Order ID Comments User Context   02/20/2018 16:06 02/25/2018 17:39 Full Code [id number]  Gorden Harms, MD Inpatient   12/01/2017 17:51 12/03/2017 18:27 Partial Code [id number]  Hillary Bow, MD Inpatient   12/01/2017 16:37 12/01/2017 17:51 Full Code [id number]  Olean Ree, MD Inpatient   10/19/2017 20:00 10/26/2017 19:13 Full Code [id number]  Max Sane, MD Inpatient   06/20/2017 16:48 06/22/2017 19:26 Full Code [id number]  Bettey Costa, MD Inpatient   06/17/2017 18:50 06/19/2017 14:32 Full Code [id number]  Henreitta Leber, MD Inpatient   08/05/2015 15:25 08/05/2015 18:45 Full Code [id number]  Katha Cabal, MD Inpatient   05/20/2015 16:18 05/20/2015 19:37 Full Code [id number]  Schnier, Dolores Lory, MD Inpatient    Advance Directive Documentation     Most Recent Value  Type of Advance Directive  Healthcare Power of Attorney, Living will  Pre-existing out of facility DNR order (yellow form or pink MOST form)  No data  "MOST" Form in Place?  No data      TOTAL TIME TAKING CARE OF THIS PATIENT: 38 minutes.    Gladstone Lighter M.D on 02/26/2018 at 11:21 AM  Between 7am to 6pm - Pager - [phone]  After 6pm go to [email or web address] - Proofreader  [facility]  Office  [phone]  CC: Primary care physician; Baxter Hire, MD   Note: This dictation was prepared with Dragon dictation along with smaller phrase technology. Any transcriptional errors that result from this process are unintentional.

## 2018-02-27 ENCOUNTER — Encounter: Payer: Self-pay | Admitting: Dietician

## 2018-02-27 LAB — MISC LABCORP TEST (SEND OUT): Labcorp test code: 1479

## 2018-02-27 NOTE — Progress Notes (Unsigned)
Brief Nutrition Note  Pt's ascorbic acid lab returned significant for scurvy. Recommend vitamin C 500mg  po BID and recommend recheck ascorbic acid labs in 1 month.   Test Ordered: [id number] Vitamin C  Vitamin C           0.1     [L ] mg/dL  BN    Reference Range: 0.2-2.0      Koleen Distance MS, RD, LDN Pager #[phone] After Hours Pager: [phone]

## 2018-02-28 LAB — AEROBIC/ANAEROBIC CULTURE (SURGICAL/DEEP WOUND): SPECIAL REQUESTS: NORMAL

## 2018-02-28 LAB — AEROBIC/ANAEROBIC CULTURE W GRAM STAIN (SURGICAL/DEEP WOUND): Culture: NORMAL

## 2018-03-01 ENCOUNTER — Telehealth: Payer: Self-pay

## 2018-03-01 NOTE — Telephone Encounter (Signed)
Flagged on EMMI report for not reading discharge papers and not having a follow up scheduled.  Attempted to reach patient on 03/01/18 at 9:55am, however no answer.  Left voicemail.  Will attempt at later time.

## 2018-03-02 NOTE — Telephone Encounter (Signed)
Second attempt to reach patient made 03/02/18 at 1:15pm, however line was busy.

## 2018-03-06 ENCOUNTER — Telehealth: Payer: Self-pay | Admitting: Licensed Clinical Social Worker

## 2018-03-06 NOTE — Telephone Encounter (Signed)
CSW attempted to call patient to follow up on the EMMI call report which stated patient answered "yes" to feeling sad/hopeless. CSW had to leave message. Shela Leff MSW,LCSW [phone]

## 2018-03-08 ENCOUNTER — Telehealth: Payer: Self-pay | Admitting: Licensed Clinical Social Worker

## 2018-03-08 NOTE — Telephone Encounter (Signed)
CSW attempted to call patient for second time this morning in order to follow up on the responses he gave to the [facility] call he received. CSW had to leave a message. Shela Leff MSW,LCSW [phone]

## 2018-03-27 DEATH — deceased

## 2018-04-13 ENCOUNTER — Ambulatory Visit: Payer: Medicare Other | Admitting: Internal Medicine

## 2018-05-24 ENCOUNTER — Encounter (INDEPENDENT_AMBULATORY_CARE_PROVIDER_SITE_OTHER): Payer: Medicare Other

## 2018-05-24 ENCOUNTER — Ambulatory Visit (INDEPENDENT_AMBULATORY_CARE_PROVIDER_SITE_OTHER): Payer: Medicare Other | Admitting: Vascular Surgery

## 2018-06-13 IMAGING — CR DG CHEST 2V
2 series · 2 of 2 positions shown · non-contrast
Comparison: 07/25/2014 chest radiograph.

CLINICAL DATA: Shortness of breath

EXAM:
CHEST  2 VIEW

[chest pa]
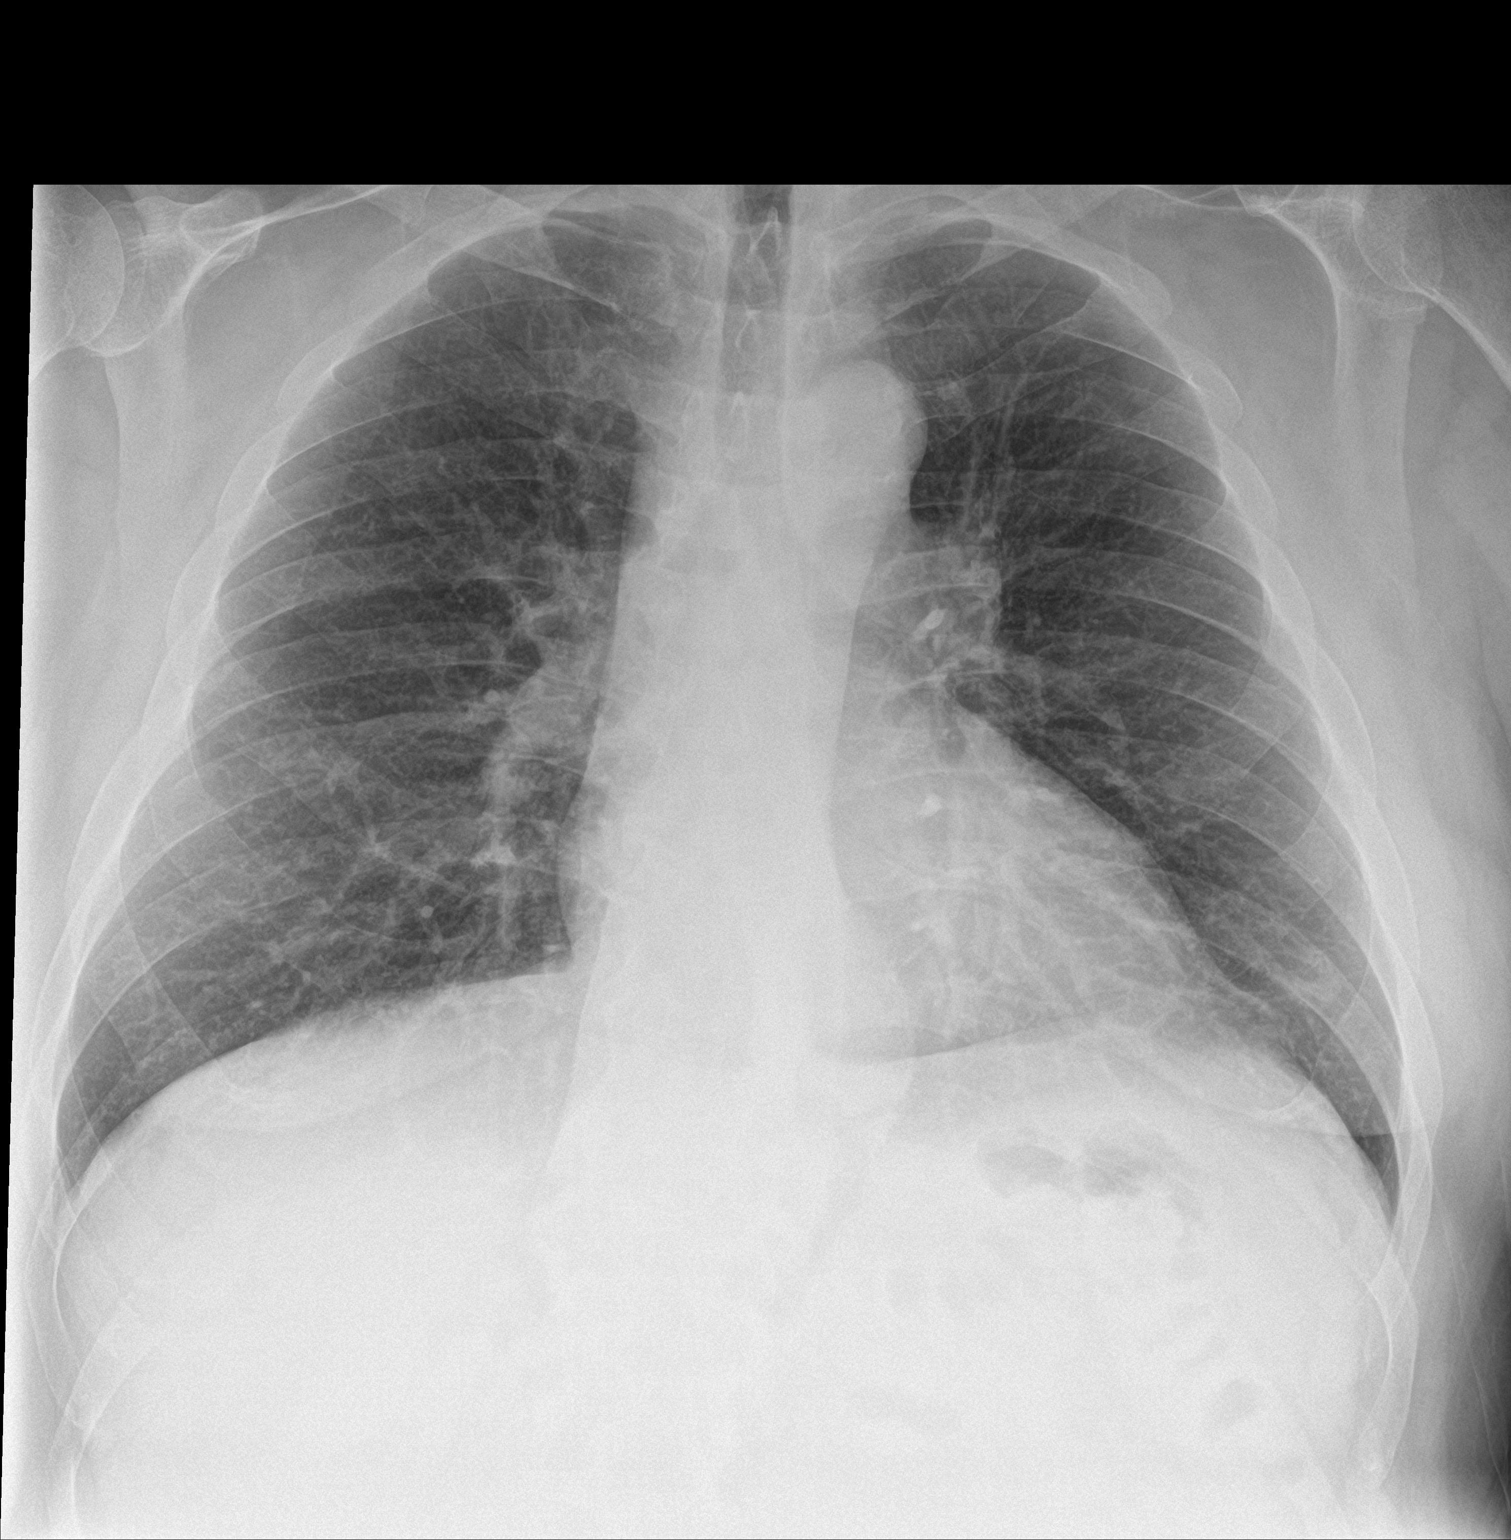

[chest lat]
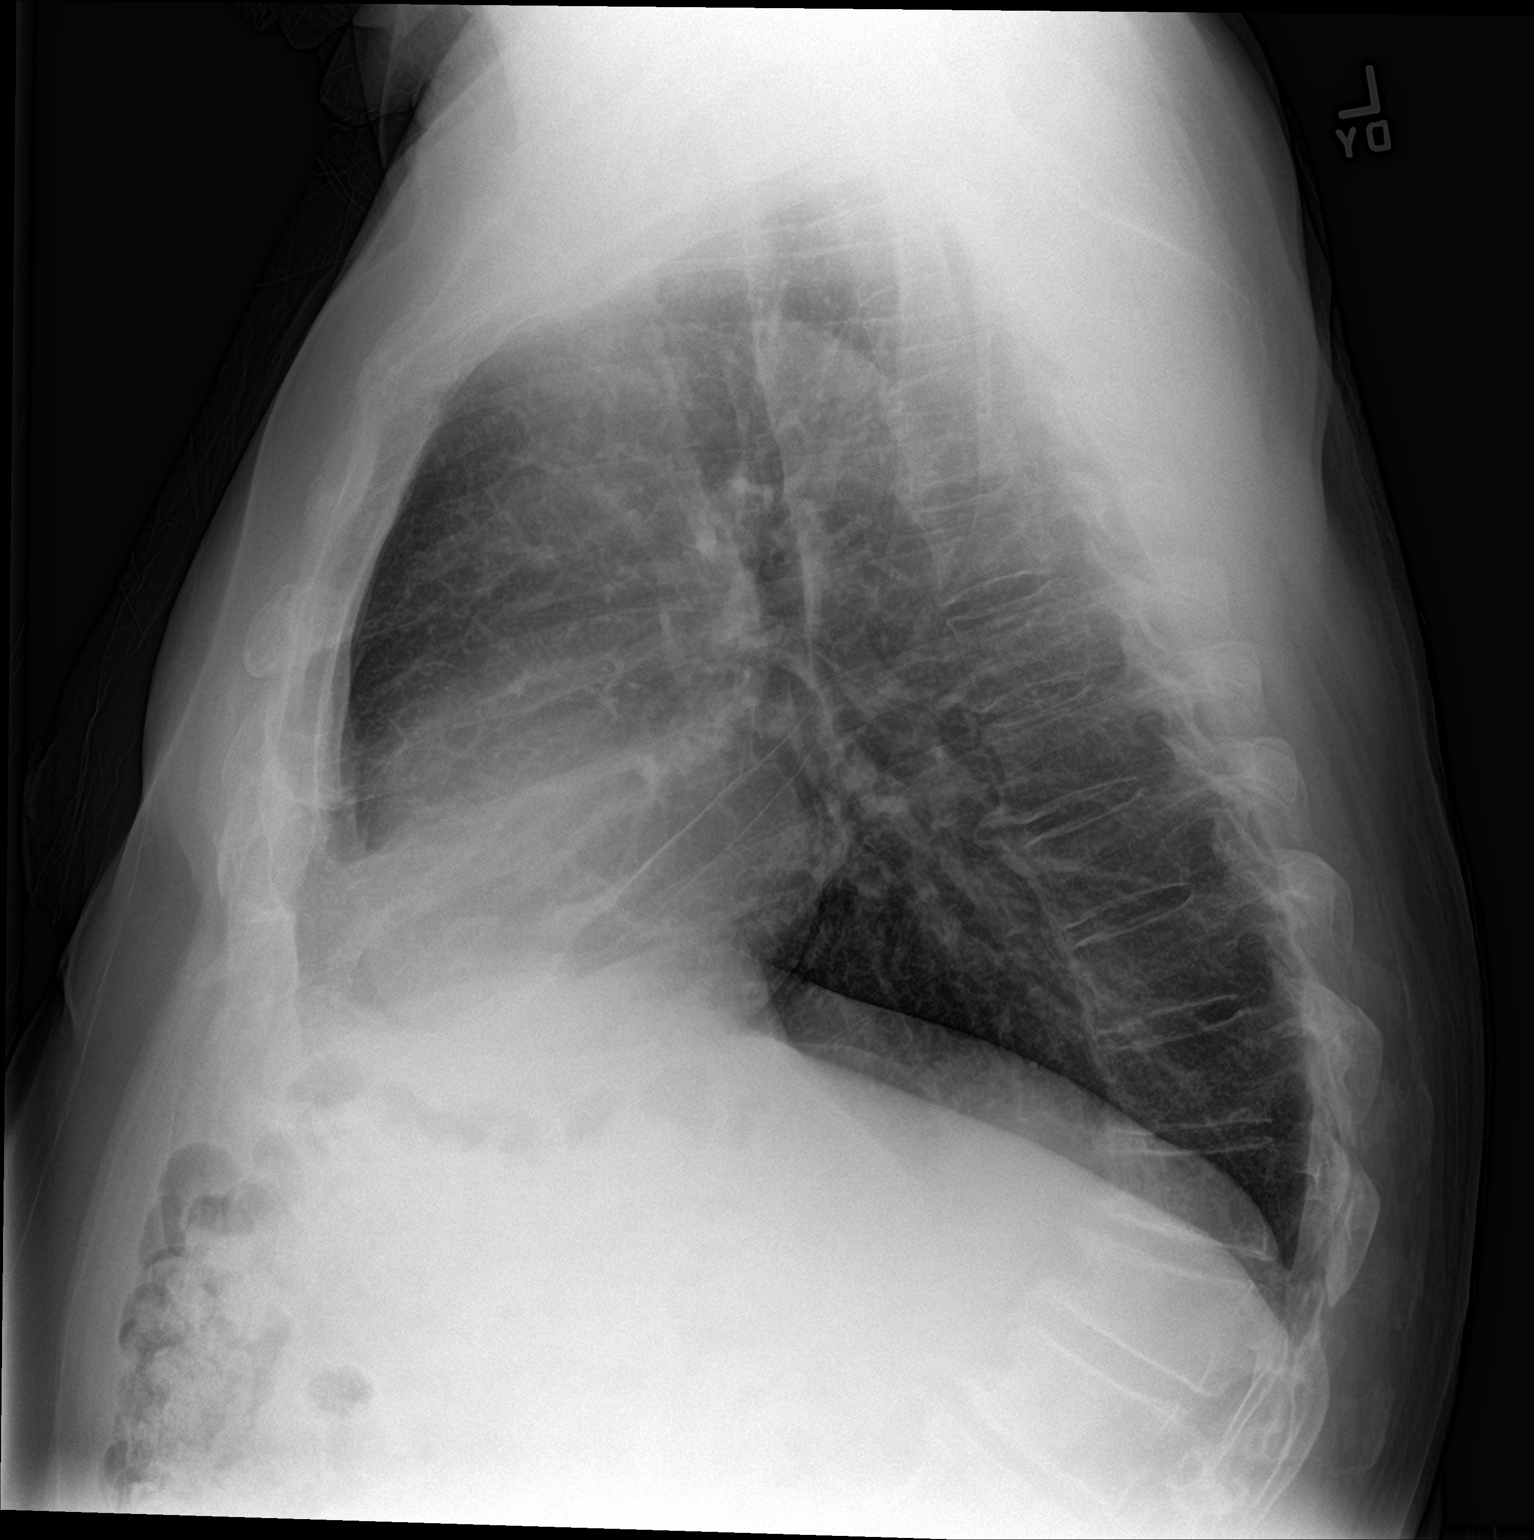

[2 of 2 positions shown; findings below may reference images not displayed]

FINDINGS: Stable cardiomediastinal silhouette with normal heart size and
mildly tortuous atherosclerotic thoracic aorta. No pneumothorax. No
pleural effusion. Lungs appear clear, with no acute consolidative
airspace disease and no pulmonary edema.
IMPRESSION: No active cardiopulmonary disease.

## 2018-07-25 ENCOUNTER — Encounter (INDEPENDENT_AMBULATORY_CARE_PROVIDER_SITE_OTHER): Payer: Medicare Other

## 2018-07-25 ENCOUNTER — Ambulatory Visit (INDEPENDENT_AMBULATORY_CARE_PROVIDER_SITE_OTHER): Payer: Medicare Other | Admitting: Vascular Surgery

## 2019-01-08 IMAGING — CR DG CHEST 2V
2 series · 2 of 2 positions shown · non-contrast
Comparison: 11/20/2016

CLINICAL DATA: Pt had a fast heart rate today at dialysis. Hx of
cardiac stents, pneumonia, bronchitis, asthma, hypertension, COPD,
prostate cancer. Former smoker

EXAM:
CHEST  2 VIEW

[chest pa]
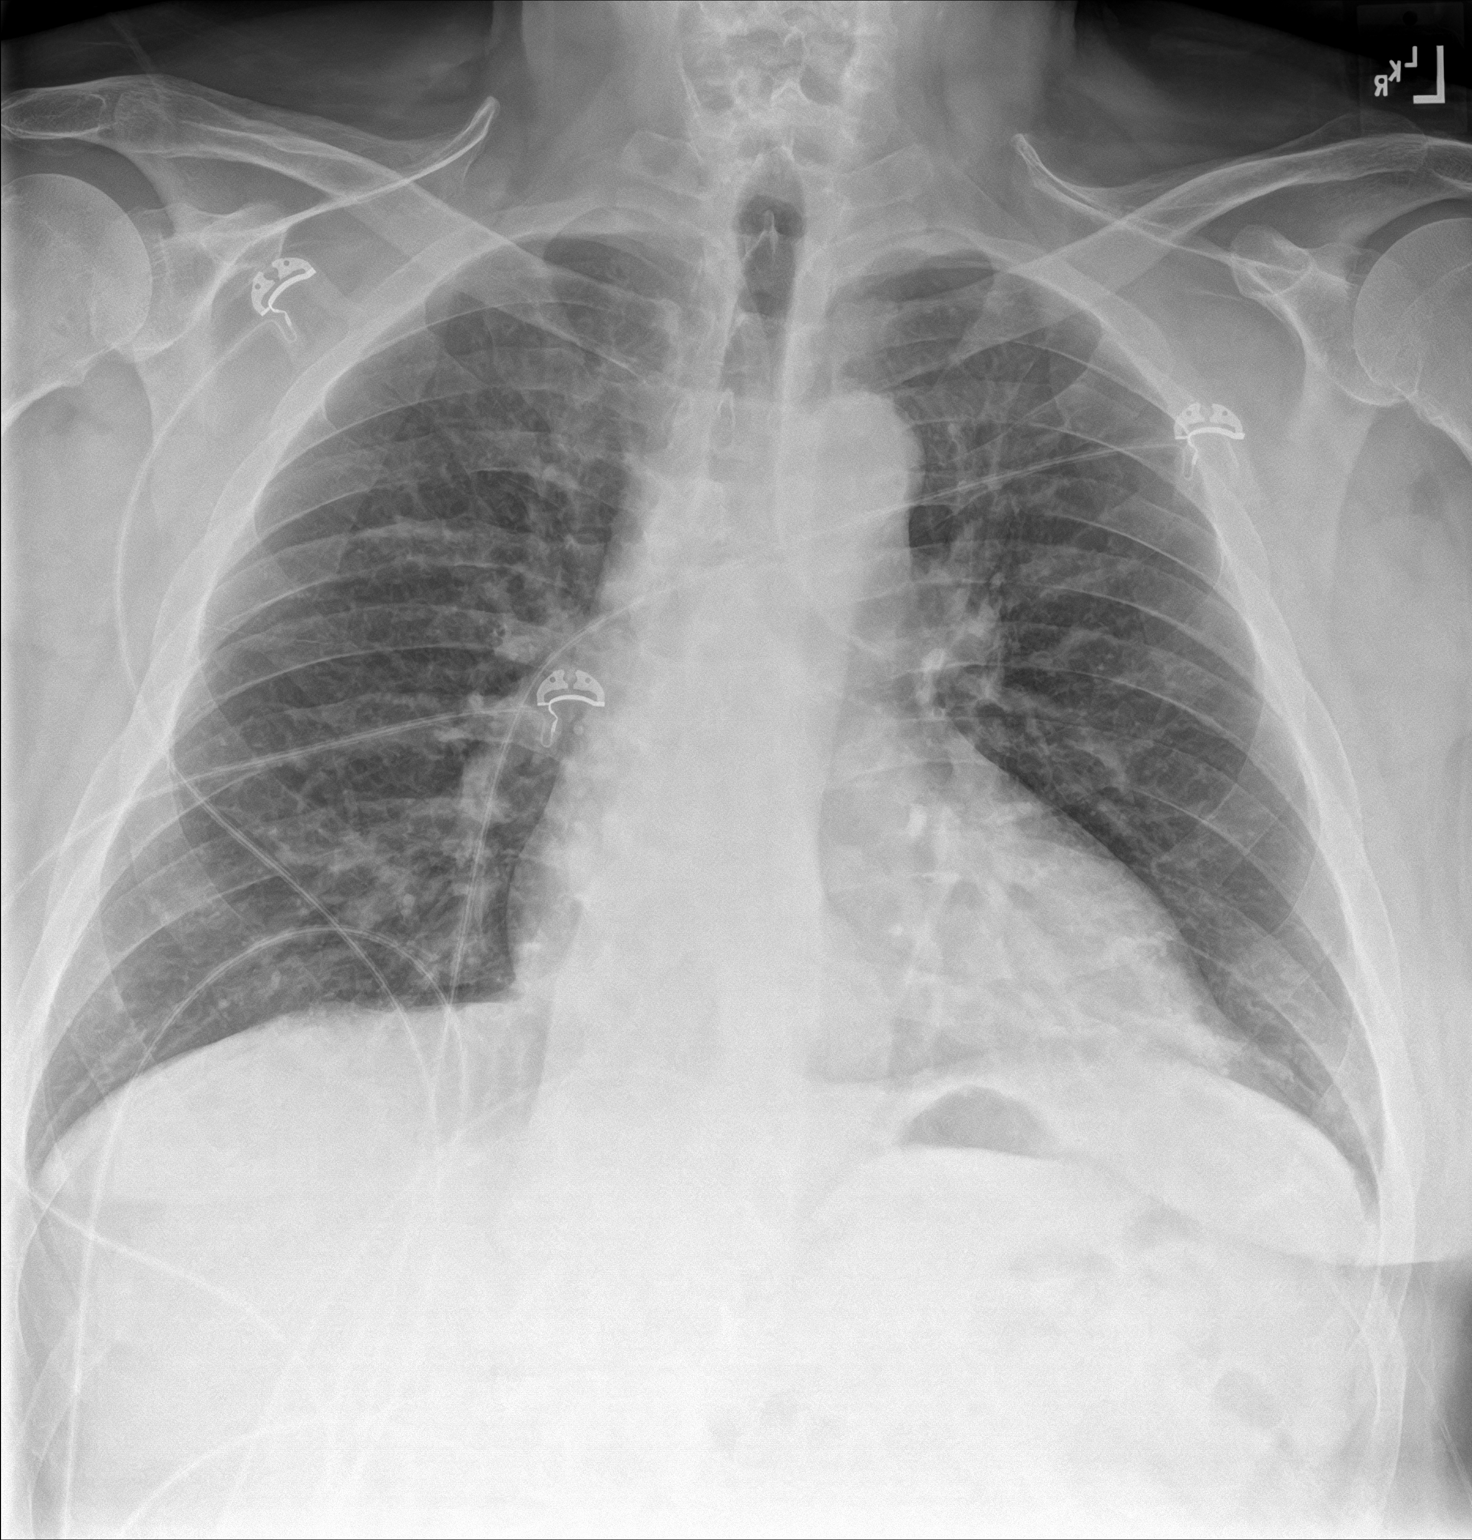

[chest lat]
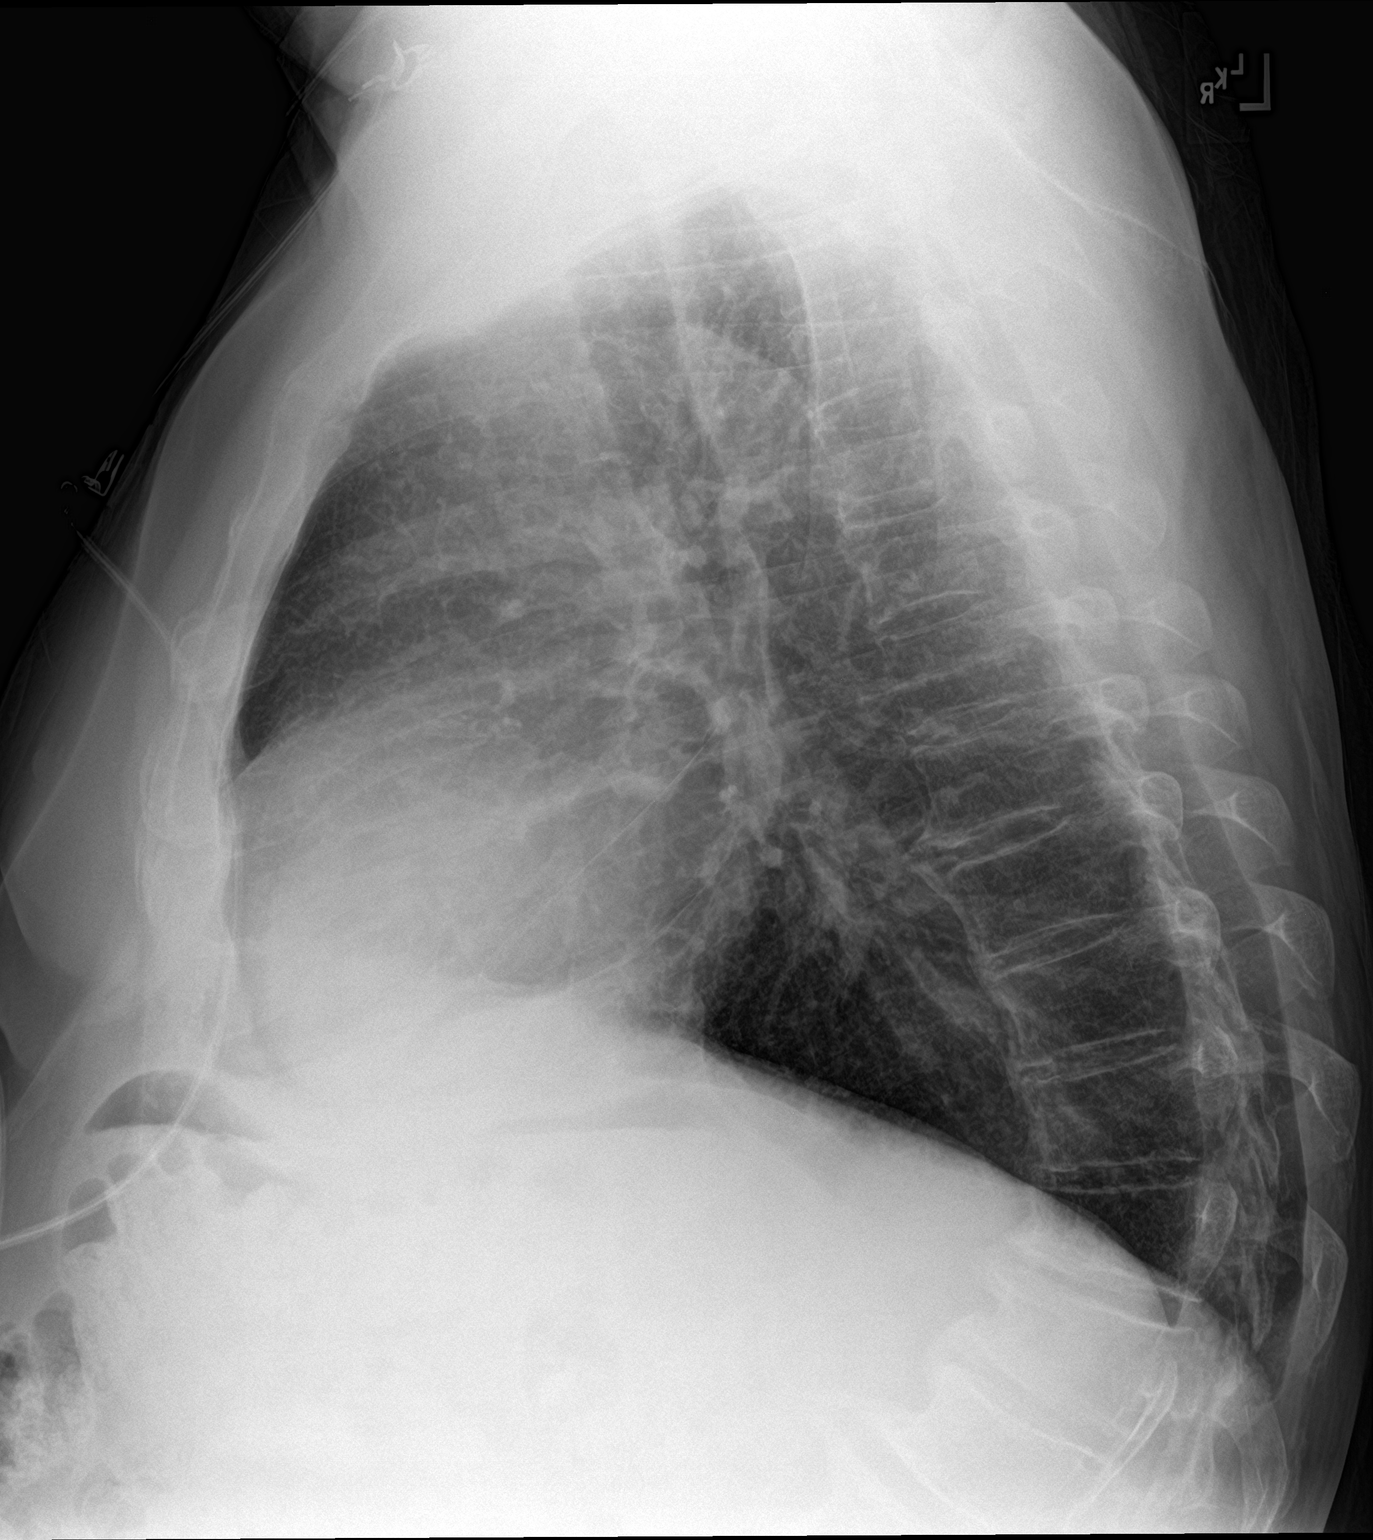

[2 of 2 positions shown; findings below may reference images not displayed]

FINDINGS: Cardiac silhouette is normal in size. No mediastinal or hilar
masses. No evidence of adenopathy.

Lungs are mildly hyperexpanded but clear. No pleural effusion or
pneumothorax.

There is a mild wedge-shaped compression deformity at the
thoracolumbar junction, stable from the prior exam. Bony thorax is
demineralized but otherwise unremarkable.
IMPRESSION: No active cardiopulmonary disease.

## 2019-06-25 IMAGING — CT CT IMAGE GUIDED DRAINAGE BY PERCUTANEOUS CATHETER
1 of 2 series · 14 of 32 positions shown, 19 images · non-contrast
Comparison: none

INDICATION: Small anterior pelvic diverticular abscess
TECHNIQUE: Informed written consent was obtained from the patient after a
thorough discussion of the procedural risks, benefits and
alternatives. All questions were addressed. Maximal Sterile Barrier
Technique was utilized including caps, mask, sterile gowns, sterile
gloves, sterile drape, hand hygiene and skin antiseptic. A timeout
was performed prior to the initiation of the procedure.

[Series 2: i-spiral 5.0 b30f · axial · 0.92mm/px · z∈[-152,-15]mm · 14 of 45 slices shown, 19 images]
[im 3/45  soft-tissue]
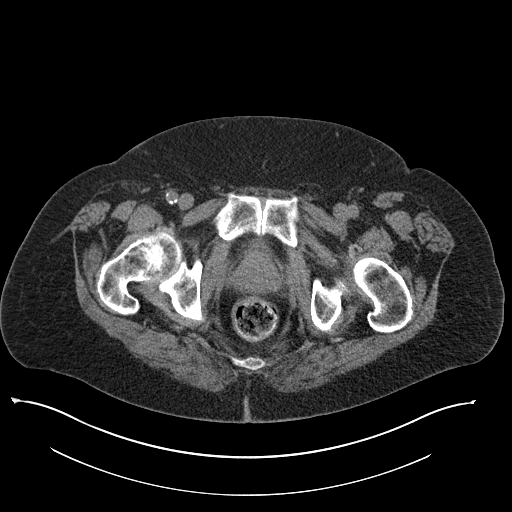
[im 3/45  bone]
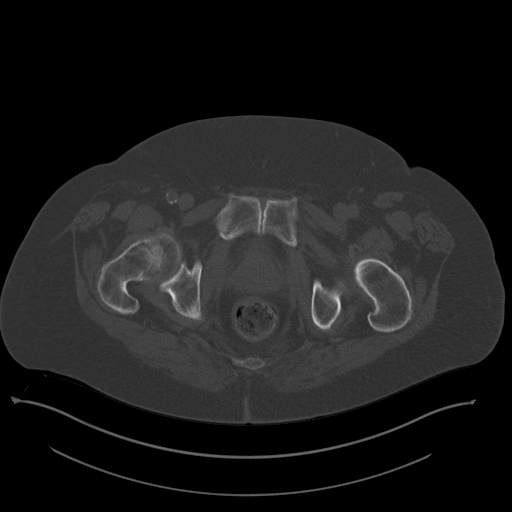
[im 7/45  soft-tissue]
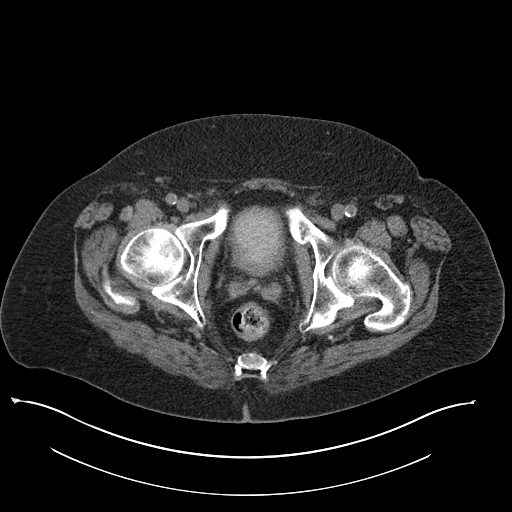
[im 9/45  soft-tissue]
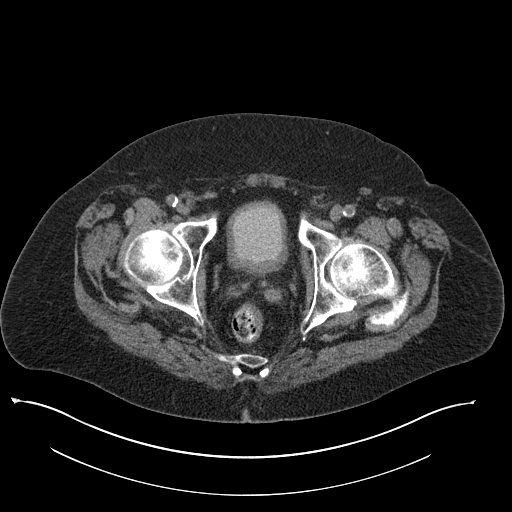
[im 13/45  soft-tissue]
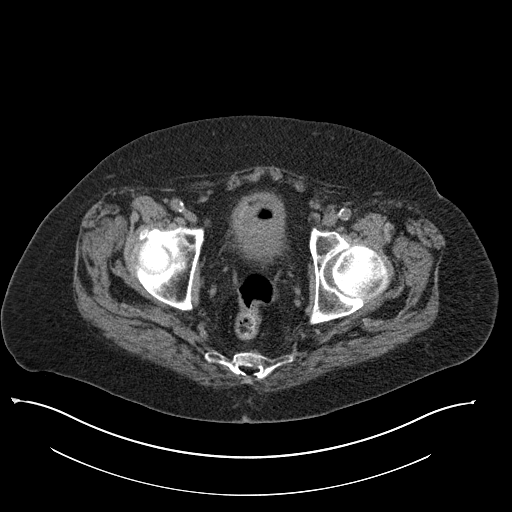
[im 15/45  soft-tissue]
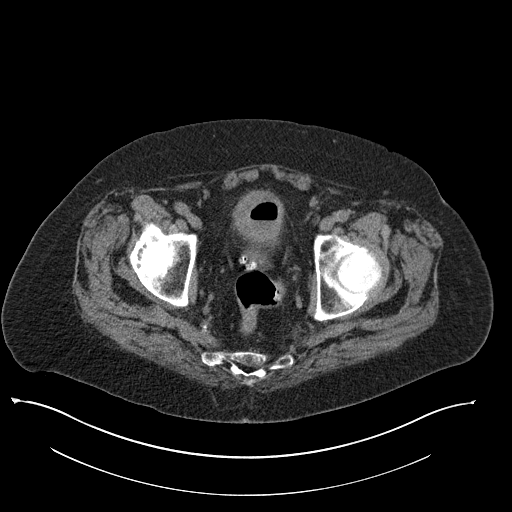
[im 19/45  soft-tissue]
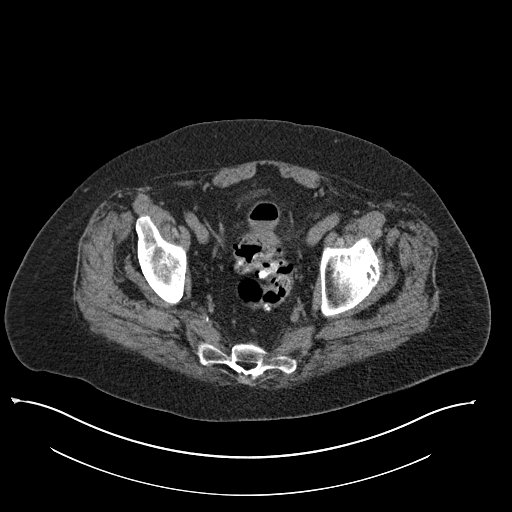
[im 24/45  soft-tissue]
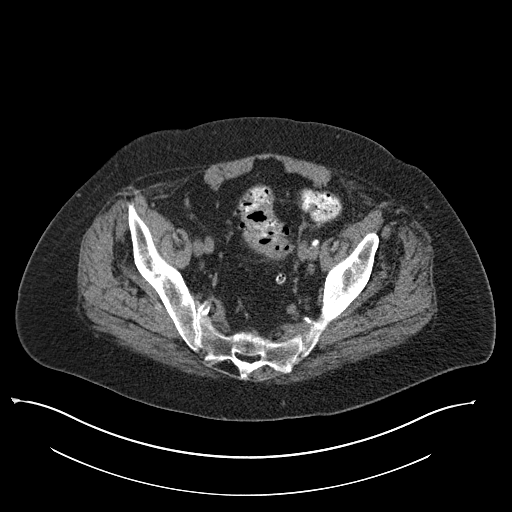
[im 26/45  soft-tissue]
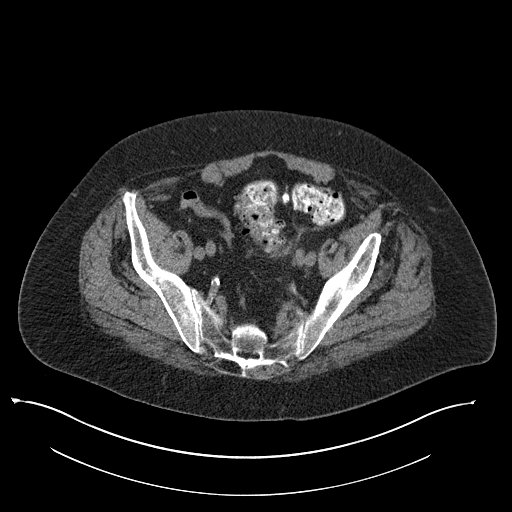
[im 30/45  soft-tissue]
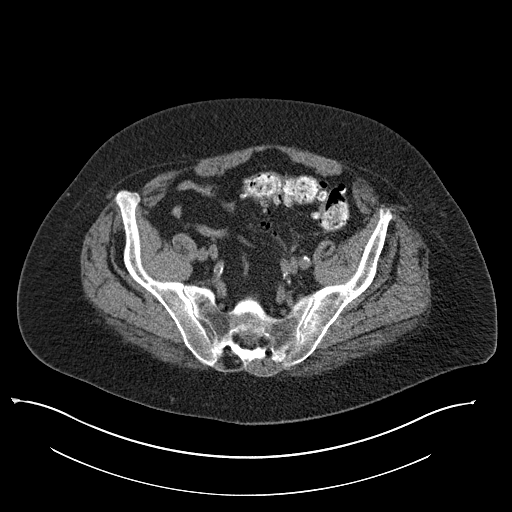
[im 30/45  bone]
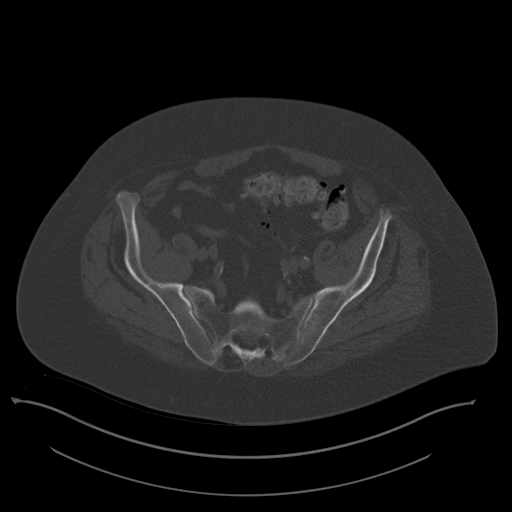
[im 32/45  soft-tissue]
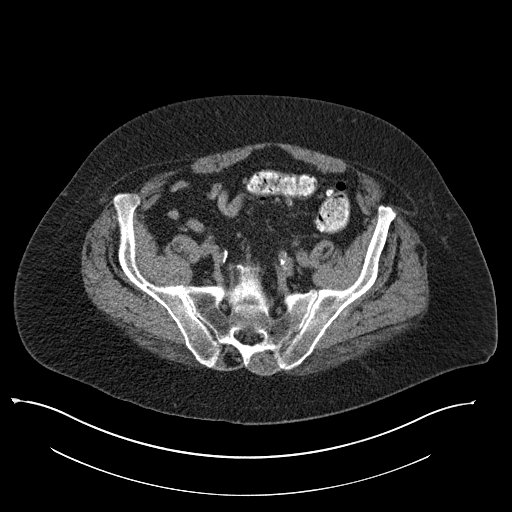
[im 36/45  soft-tissue]
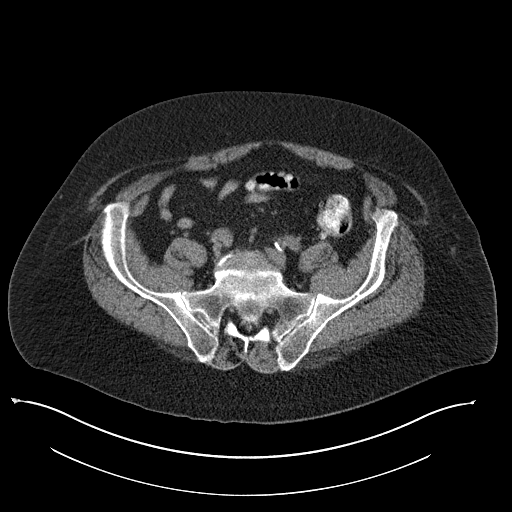
[im 36/45  lung]
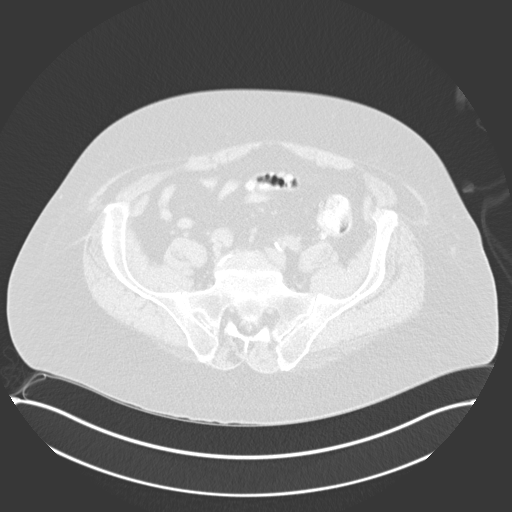
[im 38/45  soft-tissue]
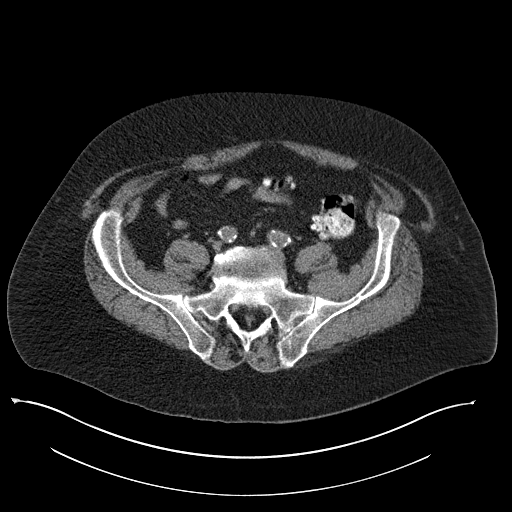
[im 38/45  lung]
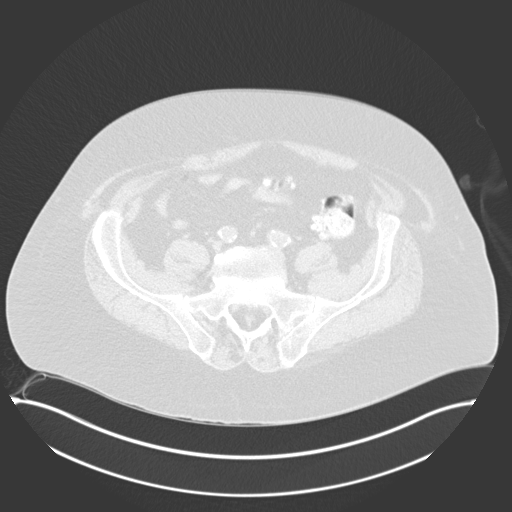
[im 40/45  lung]
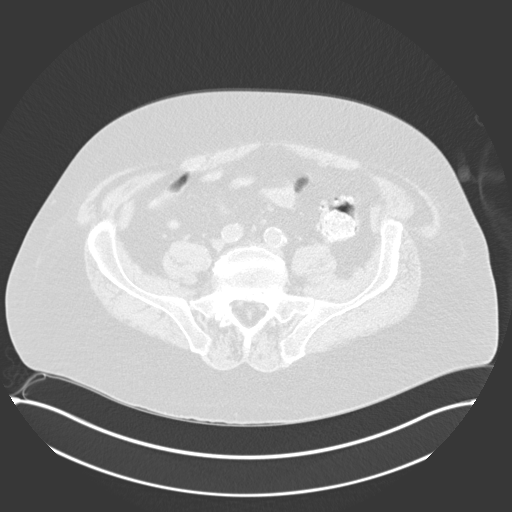
[im 42/45  soft-tissue]
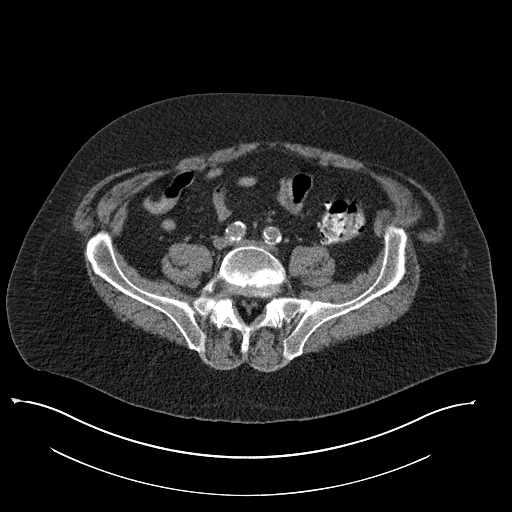
[im 42/45  lung]
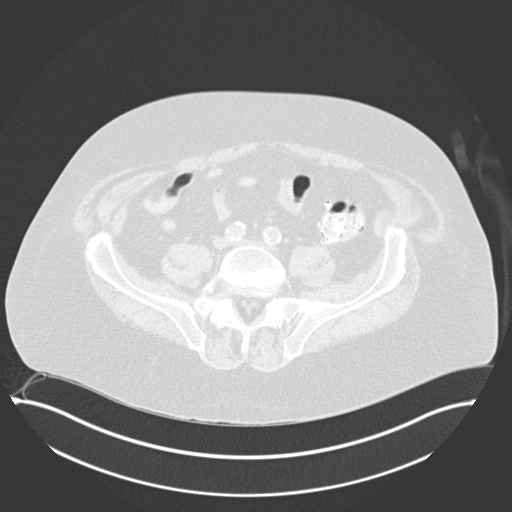

[14 of 32 positions shown; findings below may reference images not displayed]

EXAM:
CT GUIDED DRAINAGE OF ANTERIOR PELVIC DIVERTICULAR ABSCESS

MEDICATIONS:
The patient is currently admitted to the hospital and receiving
intravenous antibiotics. The antibiotics were administered within an
appropriate time frame prior to the initiation of the procedure.

ANESTHESIA/SEDATION:
50 mcg IV Fentanyl

Moderate Sedation Time:  15 MINUTES

The patient was continuously monitored during the procedure by the
interventional radiology nurse under my direct supervision.

COMPLICATIONS:
None immediate.
PROCEDURE:
Previous imaging reviewed. Patient positioned supine. Noncontrast
localization CT performed. The small abscess between the sigmoid
colon and bladder dome was localized. Overlying skin marked for a
left abdominal approach.

Under sterile conditions and local anesthesia, a 17 gauge 11.8 cm
access needle was advanced from a left anterior abdominal oblique
approach into the small abscess. Needle position confirmed with CT.
Syringe aspiration yielded 7 cc bloody fluid. This completely
collapsed the abscess. Sample sent for culture. Needle removed. No
immediate complication. Patient tolerated the procedure well.
FINDINGS: CT imaging confirms needle placed in the small anterior pelvic
diverticular abscess for aspiration
IMPRESSION: Successful CT-guided anterior pelvic diverticular abscess drainage
by needle aspiration only. 7 cc purulent fluid aspirated which
collapsed the abscess. Sample sent for culture.
